# Patient Record
Sex: Female | Born: 1975
Health system: Southern US, Community
[De-identification: ages and names within clinical notes are randomized; demographics above are authoritative.]

## PROBLEM LIST (undated history)

## (undated) DIAGNOSIS — N979 Female infertility, unspecified: Secondary | ICD-10-CM

## (undated) DIAGNOSIS — M545 Low back pain, unspecified: Secondary | ICD-10-CM

## (undated) DIAGNOSIS — R002 Palpitations: Secondary | ICD-10-CM

## (undated) DIAGNOSIS — R0602 Shortness of breath: Secondary | ICD-10-CM

## (undated) DIAGNOSIS — M25559 Pain in unspecified hip: Secondary | ICD-10-CM

## (undated) DIAGNOSIS — I517 Cardiomegaly: Secondary | ICD-10-CM

## (undated) DIAGNOSIS — I509 Heart failure, unspecified: Secondary | ICD-10-CM

## (undated) DIAGNOSIS — R0600 Dyspnea, unspecified: Secondary | ICD-10-CM

## (undated) DIAGNOSIS — F329 Major depressive disorder, single episode, unspecified: Secondary | ICD-10-CM

## (undated) DIAGNOSIS — Z91018 Allergy to other foods: Secondary | ICD-10-CM

## (undated) DIAGNOSIS — I10 Essential (primary) hypertension: Secondary | ICD-10-CM

## (undated) DIAGNOSIS — F32A Depression, unspecified: Secondary | ICD-10-CM

## (undated) DIAGNOSIS — K5900 Constipation, unspecified: Secondary | ICD-10-CM

## (undated) DIAGNOSIS — K259 Gastric ulcer, unspecified as acute or chronic, without hemorrhage or perforation: Secondary | ICD-10-CM

## (undated) DIAGNOSIS — D649 Anemia, unspecified: Secondary | ICD-10-CM

## (undated) DIAGNOSIS — G4733 Obstructive sleep apnea (adult) (pediatric): Secondary | ICD-10-CM

## (undated) DIAGNOSIS — R5383 Other fatigue: Secondary | ICD-10-CM

## (undated) DIAGNOSIS — R079 Chest pain, unspecified: Secondary | ICD-10-CM

## (undated) DIAGNOSIS — E785 Hyperlipidemia, unspecified: Secondary | ICD-10-CM

## (undated) DIAGNOSIS — K219 Gastro-esophageal reflux disease without esophagitis: Secondary | ICD-10-CM

## (undated) DIAGNOSIS — R600 Localized edema: Secondary | ICD-10-CM

## (undated) DIAGNOSIS — M25519 Pain in unspecified shoulder: Secondary | ICD-10-CM

## (undated) DIAGNOSIS — R7303 Prediabetes: Secondary | ICD-10-CM

## (undated) DIAGNOSIS — F419 Anxiety disorder, unspecified: Secondary | ICD-10-CM

## (undated) DIAGNOSIS — E739 Lactose intolerance, unspecified: Secondary | ICD-10-CM

## (undated) DIAGNOSIS — Z9989 Dependence on other enabling machines and devices: Secondary | ICD-10-CM

## (undated) DIAGNOSIS — I8390 Asymptomatic varicose veins of unspecified lower extremity: Secondary | ICD-10-CM

## (undated) HISTORY — DX: Essential (primary) hypertension: I10

## (undated) HISTORY — DX: Hyperlipidemia, unspecified: E78.5

## (undated) HISTORY — DX: Localized edema: R60.0

## (undated) HISTORY — DX: Dyspnea, unspecified: R06.00

## (undated) HISTORY — DX: Asymptomatic varicose veins of unspecified lower extremity: I83.90

## (undated) HISTORY — DX: Constipation, unspecified: K59.00

## (undated) HISTORY — DX: Palpitations: R00.2

## (undated) HISTORY — DX: Other fatigue: R53.83

## (undated) HISTORY — DX: Shortness of breath: R06.02

## (undated) HISTORY — DX: Pain in unspecified shoulder: M25.519

## (undated) HISTORY — DX: Pain in unspecified hip: M25.559

## (undated) HISTORY — DX: Allergy to other foods: Z91.018

## (undated) HISTORY — DX: Anxiety disorder, unspecified: F41.9

## (undated) HISTORY — PX: ABDOMINAL HYSTERECTOMY: SHX81

## (undated) HISTORY — PX: OTHER SURGICAL HISTORY: SHX169

## (undated) HISTORY — DX: Female infertility, unspecified: N97.9

## (undated) HISTORY — PX: TOTAL ABDOMINAL HYSTERECTOMY: SHX209

## (undated) HISTORY — DX: Heart failure, unspecified: I50.9

## (undated) HISTORY — DX: Major depressive disorder, single episode, unspecified: F32.9

## (undated) HISTORY — DX: Low back pain, unspecified: M54.50

## (undated) HISTORY — DX: Dependence on other enabling machines and devices: Z99.89

## (undated) HISTORY — DX: Anemia, unspecified: D64.9

## (undated) HISTORY — DX: Lactose intolerance, unspecified: E73.9

## (undated) HISTORY — DX: Cardiomegaly: I51.7

## (undated) HISTORY — DX: Prediabetes: R73.03

## (undated) HISTORY — DX: Gastric ulcer, unspecified as acute or chronic, without hemorrhage or perforation: K25.9

## (undated) HISTORY — DX: Chest pain, unspecified: R07.9

## (undated) HISTORY — DX: Obstructive sleep apnea (adult) (pediatric): G47.33

## (undated) HISTORY — DX: Gastro-esophageal reflux disease without esophagitis: K21.9

## (undated) HISTORY — DX: Depression, unspecified: F32.A

## (undated) HISTORY — DX: Low back pain: M54.5

---

## 2005-12-17 HISTORY — PX: ENDOMETRIAL ABLATION: SHX621

## 2013-04-21 ENCOUNTER — Emergency Department (INDEPENDENT_AMBULATORY_CARE_PROVIDER_SITE_OTHER)
Admission: EM | Admit: 2013-04-21 | Discharge: 2013-04-21 | Disposition: A | Discharge status: Home Health | Payer: Self-pay | Source: Home / Self Care

## 2013-04-21 ENCOUNTER — Encounter (HOSPITAL_COMMUNITY): Payer: Self-pay | Admitting: *Deleted

## 2013-04-21 DIAGNOSIS — F329 Major depressive disorder, single episode, unspecified: Secondary | ICD-10-CM

## 2013-04-21 DIAGNOSIS — E669 Obesity, unspecified: Secondary | ICD-10-CM

## 2013-04-21 NOTE — ED Provider Notes (Signed)
History     CSN: [id number]  Arrival date & time 04/21/13  1510   First MD Initiated Contact with Patient 04/21/13 1519     New patient to establish care   HPI 37 year old obese female with history of depression for past 4 years and has been on different antidepressants on Tuesday June of last year comes in to establish care. Patient reports following at [facility] in [place] for her depression. Patient was initially on Wellbutrin and dose escalated without much effect and off and that was on several different antidepressants including Effexor. She has been off meds since last June and after she moved here has not seek medical care due to lack of insurance. She informs having depressive symptoms and being tired and sleepy all the time. informs gradual weight gain over past few years. She denies any suicidal ideation or hx of suicidal ideations in past. . Patient denies any headache, blurred vision, dizziness, chest pain, palpitations, shortness of breath, abdominal pain, nausea, vomiting, bowel or urinary symptoms. Denies any joint pains or muscle aches.  History reviewed. No pertinent past medical history.  Past Surgical History  Procedure Laterality Date  . Abdominal hysterectomy      Family History  Problem Relation Age of Onset  . Hypertension Mother   . Diabetes Father   . Thyroid disease Sister     History  Substance Use Topics  . Smoking status: Current Every Day Smoker -- 1.00 packs/day for 10 years    Types: Cigarettes  . Smokeless tobacco: Not on file  . Alcohol Use: No    OB History   Grav Para Term Preterm Abortions TAB SAB Ect Mult Living                  Review of Systems As outlined in history of present illness Allergies  Review of patient's allergies indicates no known allergies.  Home Medications  No current outpatient prescriptions on file.  BP 163/96  Pulse 82  Temp(Src) 98.3 F (36.8 C) (Oral)  Resp 18  SpO2  99%  Physical Exam Middle aged obese female in no acute distress HEENT: No pallor, moist oral mucosa Chest: Clear to auscultation bilaterally, no added sounds CVS: Normal S1 and S2, no murmurs rub or gallop Abdomen: Soft, nontender, nondistended, bowel sounds present Extremities: Warm, no edema CNS: [id number] , normal affect and mood ED Course  Procedures (including critical care time)    No diagnosis found. Depression Patient here to establish care. I have accordingly [facility] [place]  in [place] and asked for her records including progress notes, meds , labs and imaging done . They will be faxed to [name] on 5/9. She had followed with Ms Perlie Gold , FNP there. Once we get the records seem to be for a followup visit. After reviewing  records from the clinic we will decide on further tests and initiate medications.   Patient was also noted to have elevated blood pressure and denies history of hypertension. Counseled on low salt intake in diet and her blood pressure will be monitored again.   MDM  Followup in one week        Eddie North, MD 04/21/13 1558

## 2013-04-21 NOTE — ED Notes (Signed)
Present today for refill on medications

## 2017-02-21 ENCOUNTER — Ambulatory Visit (INDEPENDENT_AMBULATORY_CARE_PROVIDER_SITE_OTHER): Payer: 59 | Admitting: Nurse Practitioner

## 2017-02-21 ENCOUNTER — Ambulatory Visit (INDEPENDENT_AMBULATORY_CARE_PROVIDER_SITE_OTHER)
Admission: RE | Admit: 2017-02-21 | Discharge: 2017-02-21 | Disposition: A | Discharge status: Home / Self Care | Payer: 59 | Source: Ambulatory Visit | Attending: Nurse Practitioner | Admitting: Nurse Practitioner

## 2017-02-21 ENCOUNTER — Encounter: Payer: Self-pay | Admitting: Nurse Practitioner

## 2017-02-21 ENCOUNTER — Other Ambulatory Visit (INDEPENDENT_AMBULATORY_CARE_PROVIDER_SITE_OTHER): Payer: 59

## 2017-02-21 ENCOUNTER — Inpatient Hospital Stay (HOSPITAL_COMMUNITY)
Admission: EM | Admit: 2017-02-21 | Discharge: 2017-02-25 | DRG: 304 | Disposition: A | Discharge status: Home / Self Care | Payer: 59 | Attending: Internal Medicine | Admitting: Internal Medicine

## 2017-02-21 ENCOUNTER — Encounter (HOSPITAL_COMMUNITY): Payer: Self-pay

## 2017-02-21 VITALS — BP 170/100 | HR 98 | Temp 97.7°F | Ht 68.0 in | Wt 344.0 lb

## 2017-02-21 DIAGNOSIS — K219 Gastro-esophageal reflux disease without esophagitis: Secondary | ICD-10-CM | POA: Diagnosis present

## 2017-02-21 DIAGNOSIS — Z833 Family history of diabetes mellitus: Secondary | ICD-10-CM

## 2017-02-21 DIAGNOSIS — F419 Anxiety disorder, unspecified: Secondary | ICD-10-CM | POA: Diagnosis present

## 2017-02-21 DIAGNOSIS — R600 Localized edema: Secondary | ICD-10-CM

## 2017-02-21 DIAGNOSIS — G43909 Migraine, unspecified, not intractable, without status migrainosus: Secondary | ICD-10-CM | POA: Diagnosis present

## 2017-02-21 DIAGNOSIS — R5383 Other fatigue: Secondary | ICD-10-CM

## 2017-02-21 DIAGNOSIS — R0602 Shortness of breath: Secondary | ICD-10-CM | POA: Diagnosis present

## 2017-02-21 DIAGNOSIS — Z9114 Patient's other noncompliance with medication regimen: Secondary | ICD-10-CM | POA: Diagnosis not present

## 2017-02-21 DIAGNOSIS — R0600 Dyspnea, unspecified: Secondary | ICD-10-CM

## 2017-02-21 DIAGNOSIS — R0902 Hypoxemia: Secondary | ICD-10-CM | POA: Diagnosis present

## 2017-02-21 DIAGNOSIS — I5041 Acute combined systolic (congestive) and diastolic (congestive) heart failure: Secondary | ICD-10-CM | POA: Diagnosis present

## 2017-02-21 DIAGNOSIS — I10 Essential (primary) hypertension: Secondary | ICD-10-CM | POA: Insufficient documentation

## 2017-02-21 DIAGNOSIS — F17210 Nicotine dependence, cigarettes, uncomplicated: Secondary | ICD-10-CM | POA: Diagnosis present

## 2017-02-21 DIAGNOSIS — Z8349 Family history of other endocrine, nutritional and metabolic diseases: Secondary | ICD-10-CM | POA: Diagnosis not present

## 2017-02-21 DIAGNOSIS — I5021 Acute systolic (congestive) heart failure: Secondary | ICD-10-CM | POA: Diagnosis not present

## 2017-02-21 DIAGNOSIS — I161 Hypertensive emergency: Secondary | ICD-10-CM | POA: Diagnosis not present

## 2017-02-21 DIAGNOSIS — R51 Headache: Secondary | ICD-10-CM | POA: Diagnosis not present

## 2017-02-21 DIAGNOSIS — Z8249 Family history of ischemic heart disease and other diseases of the circulatory system: Secondary | ICD-10-CM

## 2017-02-21 DIAGNOSIS — I43 Cardiomyopathy in diseases classified elsewhere: Secondary | ICD-10-CM | POA: Diagnosis present

## 2017-02-21 DIAGNOSIS — Z720 Tobacco use: Secondary | ICD-10-CM | POA: Diagnosis not present

## 2017-02-21 DIAGNOSIS — R05 Cough: Secondary | ICD-10-CM | POA: Diagnosis not present

## 2017-02-21 DIAGNOSIS — I248 Other forms of acute ischemic heart disease: Secondary | ICD-10-CM | POA: Diagnosis present

## 2017-02-21 DIAGNOSIS — I160 Hypertensive urgency: Secondary | ICD-10-CM | POA: Diagnosis present

## 2017-02-21 DIAGNOSIS — F32A Depression, unspecified: Secondary | ICD-10-CM | POA: Diagnosis present

## 2017-02-21 DIAGNOSIS — F4323 Adjustment disorder with mixed anxiety and depressed mood: Secondary | ICD-10-CM | POA: Insufficient documentation

## 2017-02-21 DIAGNOSIS — Z6841 Body Mass Index (BMI) 40.0 and over, adult: Secondary | ICD-10-CM | POA: Diagnosis not present

## 2017-02-21 DIAGNOSIS — J811 Chronic pulmonary edema: Secondary | ICD-10-CM

## 2017-02-21 DIAGNOSIS — I429 Cardiomyopathy, unspecified: Secondary | ICD-10-CM

## 2017-02-21 DIAGNOSIS — R519 Headache, unspecified: Secondary | ICD-10-CM

## 2017-02-21 DIAGNOSIS — F339 Major depressive disorder, recurrent, unspecified: Secondary | ICD-10-CM | POA: Insufficient documentation

## 2017-02-21 DIAGNOSIS — R4182 Altered mental status, unspecified: Secondary | ICD-10-CM | POA: Diagnosis not present

## 2017-02-21 DIAGNOSIS — I428 Other cardiomyopathies: Secondary | ICD-10-CM | POA: Diagnosis not present

## 2017-02-21 DIAGNOSIS — F329 Major depressive disorder, single episode, unspecified: Secondary | ICD-10-CM | POA: Diagnosis present

## 2017-02-21 DIAGNOSIS — I509 Heart failure, unspecified: Secondary | ICD-10-CM | POA: Diagnosis not present

## 2017-02-21 DIAGNOSIS — Z0000 Encounter for general adult medical examination without abnormal findings: Secondary | ICD-10-CM

## 2017-02-21 DIAGNOSIS — F418 Other specified anxiety disorders: Secondary | ICD-10-CM | POA: Diagnosis not present

## 2017-02-21 DIAGNOSIS — R079 Chest pain, unspecified: Secondary | ICD-10-CM

## 2017-02-21 DIAGNOSIS — I110 Hypertensive heart disease with heart failure: Secondary | ICD-10-CM | POA: Diagnosis present

## 2017-02-21 DIAGNOSIS — J810 Acute pulmonary edema: Secondary | ICD-10-CM | POA: Diagnosis not present

## 2017-02-21 HISTORY — DX: Acute combined systolic (congestive) and diastolic (congestive) heart failure: I50.41

## 2017-02-21 LAB — COMPREHENSIVE METABOLIC PANEL
ALT: 12 U/L (ref 0–35)
AST: 13 U/L (ref 0–37)
Albumin: 3.6 g/dL (ref 3.5–5.2)
Alkaline Phosphatase: 70 U/L (ref 39–117)
BUN: 18 mg/dL (ref 6–23)
CHLORIDE: 103 meq/L (ref 96–112)
CO2: 30 meq/L (ref 19–32)
Calcium: 9.2 mg/dL (ref 8.4–10.5)
Creatinine, Ser: 1.1 mg/dL (ref 0.40–1.20)
GFR: 58.98 mL/min — AB (ref >60.00)
GLUCOSE: 99 mg/dL (ref 70–99)
POTASSIUM: 3.8 meq/L (ref 3.5–5.1)
Sodium: 138 mEq/L (ref 135–145)
Total Bilirubin: 0.6 mg/dL (ref 0.2–1.2)
Total Protein: 6.5 g/dL (ref 6.0–8.3)

## 2017-02-21 LAB — CBC WITH DIFFERENTIAL/PLATELET
BASOS ABS: 0.1 10*3/uL (ref 0.0–0.1)
Basophils Relative: 0.9 % (ref 0.0–3.0)
EOS PCT: 1.4 % (ref 0.0–5.0)
Eosinophils Absolute: 0.1 10*3/uL (ref 0.0–0.7)
HCT: 39.6 % (ref 36.0–46.0)
HEMOGLOBIN: 11.9 g/dL — AB (ref 12.0–15.0)
LYMPHS PCT: 17 % (ref 12.0–46.0)
Lymphs Abs: 1.3 10*3/uL (ref 0.7–4.0)
MCHC: 30.1 g/dL (ref 30.0–36.0)
MCV: 79.8 fl (ref 78.0–100.0)
MONOS PCT: 7.9 % (ref 3.0–12.0)
Monocytes Absolute: 0.6 10*3/uL (ref 0.1–1.0)
Neutro Abs: 5.7 10*3/uL (ref 1.4–7.7)
Neutrophils Relative %: 72.8 % (ref 43.0–77.0)
Platelets: 214 10*3/uL (ref 150.0–400.0)
RBC: 4.97 Mil/uL (ref 3.87–5.11)
RDW: 17.2 % — ABNORMAL HIGH (ref 11.5–15.5)
WBC: 7.9 10*3/uL (ref 4.0–10.5)

## 2017-02-21 LAB — TROPONIN I: TROPONIN I: 0.04 ng/mL — AB (ref <0.03)

## 2017-02-21 LAB — RAPID URINE DRUG SCREEN, HOSP PERFORMED
AMPHETAMINES: NOT DETECTED
BENZODIAZEPINES: NOT DETECTED
Barbiturates: NOT DETECTED
Cocaine: NOT DETECTED
OPIATES: NOT DETECTED
Tetrahydrocannabinol: NOT DETECTED

## 2017-02-21 LAB — I-STAT TROPONIN, ED: Troponin i, poc: 0.03 ng/mL (ref 0.00–0.08)

## 2017-02-21 LAB — TSH: TSH: 4.78 u[IU]/mL — ABNORMAL HIGH (ref 0.35–4.50)

## 2017-02-21 LAB — BRAIN NATRIURETIC PEPTIDE: Pro B Natriuretic peptide (BNP): 1145 pg/mL — ABNORMAL HIGH (ref 0.0–100.0)

## 2017-02-21 LAB — HEMOGLOBIN A1C: HEMOGLOBIN A1C: 5.8 % (ref 4.6–6.5)

## 2017-02-21 MED ORDER — ALPRAZOLAM 0.25 MG PO TABS
0.2500 mg | ORAL_TABLET | Freq: Two times a day (BID) | ORAL | Status: DC | PRN
  Administered 2017-02-23 – 2017-02-25 (×2): 0.25 mg via ORAL
  Filled 2017-02-21 (×2): qty 1

## 2017-02-21 MED ORDER — AMLODIPINE BESYLATE 5 MG PO TABS
10.0000 mg | ORAL_TABLET | Freq: Every day | ORAL | Status: DC
  Administered 2017-02-21: 10 mg via ORAL
  Filled 2017-02-21: qty 2

## 2017-02-21 MED ORDER — ONDANSETRON HCL 4 MG/2ML IJ SOLN
4.0000 mg | Freq: Four times a day (QID) | INTRAMUSCULAR | Status: DC | PRN
  Administered 2017-02-21 – 2017-02-22 (×2): 4 mg via INTRAVENOUS
  Filled 2017-02-21 (×3): qty 2

## 2017-02-21 MED ORDER — FUROSEMIDE 10 MG/ML IJ SOLN
60.0000 mg | Freq: Once | INTRAMUSCULAR | Status: AC
Start: 2017-02-21 — End: 2017-02-21
  Administered 2017-02-21: 60 mg via INTRAVENOUS
  Filled 2017-02-21: qty 8

## 2017-02-21 MED ORDER — POTASSIUM CHLORIDE CRYS ER 20 MEQ PO TBCR: 20.0000 meq | EXTENDED_RELEASE_TABLET | Freq: Every day | ORAL | 3 refills | Status: DC

## 2017-02-21 MED ORDER — LISINOPRIL 10 MG PO TABS
10.0000 mg | ORAL_TABLET | Freq: Every day | ORAL | Status: DC
  Administered 2017-02-21: 10 mg via ORAL
  Filled 2017-02-21 (×2): qty 1

## 2017-02-21 MED ORDER — FUROSEMIDE 10 MG/ML IJ SOLN
60.0000 mg | Freq: Two times a day (BID) | INTRAMUSCULAR | Status: DC
  Administered 2017-02-22 – 2017-02-23 (×4): 60 mg via INTRAVENOUS
  Filled 2017-02-21: qty 6
  Filled 2017-02-21: qty 8
  Filled 2017-02-21 (×2): qty 6

## 2017-02-21 MED ORDER — LISINOPRIL 10 MG PO TABS: 10.0000 mg | ORAL_TABLET | Freq: Every day | ORAL | 3 refills | Status: DC

## 2017-02-21 MED ORDER — METOPROLOL SUCCINATE ER 25 MG PO TB24: 25.0000 mg | ORAL_TABLET | Freq: Every day | ORAL | 3 refills | Status: DC

## 2017-02-21 MED ORDER — SODIUM CHLORIDE 0.9 % IV SOLN: 250.0000 mL | INTRAVENOUS | Status: DC | PRN

## 2017-02-21 MED ORDER — NICOTINE 21 MG/24HR TD PT24
21.0000 mg | MEDICATED_PATCH | Freq: Every day | TRANSDERMAL | Status: DC
  Administered 2017-02-22 – 2017-02-25 (×4): 21 mg via TRANSDERMAL
  Filled 2017-02-21 (×4): qty 1

## 2017-02-21 MED ORDER — SODIUM CHLORIDE 0.9% FLUSH
3.0000 mL | Freq: Two times a day (BID) | INTRAVENOUS | Status: DC
  Administered 2017-02-22 – 2017-02-24 (×5): 3 mL via INTRAVENOUS

## 2017-02-21 MED ORDER — ACETAMINOPHEN 325 MG PO TABS
650.0000 mg | ORAL_TABLET | ORAL | Status: DC | PRN
Start: 2017-02-21 — End: 2017-02-25
  Administered 2017-02-22 – 2017-02-23 (×6): 650 mg via ORAL
  Filled 2017-02-21 (×6): qty 2

## 2017-02-21 MED ORDER — HYDROCHLOROTHIAZIDE 25 MG PO TABS: 25.0000 mg | ORAL_TABLET | Freq: Every day | ORAL | 3 refills | Status: DC

## 2017-02-21 MED ORDER — SODIUM CHLORIDE 0.9% FLUSH: 3.0000 mL | INTRAVENOUS | Status: DC | PRN

## 2017-02-21 MED ORDER — POTASSIUM CHLORIDE 20 MEQ/15ML (10%) PO SOLN
40.0000 meq | Freq: Once | ORAL | Status: AC
Start: 2017-02-21 — End: 2017-02-21
  Administered 2017-02-21: 40 meq via ORAL
  Filled 2017-02-21: qty 30

## 2017-02-21 MED ORDER — NITROGLYCERIN 0.4 MG SL SUBL
0.4000 mg | SUBLINGUAL_TABLET | SUBLINGUAL | Status: DC | PRN
  Administered 2017-02-21: 0.4 mg via SUBLINGUAL
  Filled 2017-02-21: qty 1

## 2017-02-21 MED ORDER — ZOLPIDEM TARTRATE 5 MG PO TABS: 5.0000 mg | ORAL_TABLET | Freq: Every evening | ORAL | Status: DC | PRN

## 2017-02-21 MED ORDER — MORPHINE SULFATE (PF) 4 MG/ML IV SOLN
2.0000 mg | INTRAVENOUS | Status: DC | PRN
  Administered 2017-02-21 – 2017-02-22 (×2): 2 mg via INTRAVENOUS
  Filled 2017-02-21: qty 1

## 2017-02-21 MED ORDER — ENOXAPARIN SODIUM 40 MG/0.4ML ~~LOC~~ SOLN
40.0000 mg | SUBCUTANEOUS | Status: DC
  Administered 2017-02-21 – 2017-02-24 (×4): 40 mg via SUBCUTANEOUS
  Filled 2017-02-21 (×4): qty 0.4

## 2017-02-21 MED ORDER — NITROGLYCERIN IN D5W 200-5 MCG/ML-% IV SOLN
2.0000 ug/min | INTRAVENOUS | Status: DC
  Administered 2017-02-21: 5 ug/min via INTRAVENOUS
  Filled 2017-02-21: qty 250

## 2017-02-21 MED ORDER — ACETAMINOPHEN 325 MG PO TABS
650.0000 mg | ORAL_TABLET | ORAL | Status: AC
  Administered 2017-02-21: 650 mg via ORAL
  Filled 2017-02-21: qty 2

## 2017-02-21 MED ORDER — MORPHINE SULFATE (PF) 4 MG/ML IV SOLN
INTRAVENOUS | Status: AC
  Administered 2017-02-21: 2 mg via INTRAVENOUS
  Filled 2017-02-21: qty 1

## 2017-02-21 MED ORDER — METOPROLOL SUCCINATE ER 25 MG PO TB24
25.0000 mg | ORAL_TABLET | Freq: Every day | ORAL | Status: DC
  Administered 2017-02-21: 25 mg via ORAL
  Filled 2017-02-21 (×2): qty 1

## 2017-02-21 MED ORDER — ASPIRIN 325 MG PO TABS
325.0000 mg | ORAL_TABLET | Freq: Every day | ORAL | Status: DC
  Administered 2017-02-21 – 2017-02-25 (×5): 325 mg via ORAL
  Filled 2017-02-21 (×5): qty 1

## 2017-02-21 NOTE — ED Notes (Signed)
Hospitalist Clyde Lundborg) Provider at bedside.

## 2017-02-21 NOTE — Patient Instructions (Addendum)
Referred patient to hospital due to pulmonary edema and pericardial effusion per CXR, Pending lab results.   DASH Eating Plan DASH stands for "Dietary Approaches to Stop Hypertension." The DASH eating plan is a healthy eating plan that has been shown to reduce high blood pressure (hypertension). It may also reduce your risk for type 2 diabetes, heart disease, and stroke. The DASH eating plan may also help with weight loss. What are tips for following this plan? General guidelines   Avoid eating more than 2,300 mg (milligrams) of salt (sodium) a day. If you have hypertension, you may need to reduce your sodium intake to 1,500 mg a day.  Limit alcohol intake to no more than 1 drink a day for nonpregnant women and 2 drinks a day for men. One drink equals 12 oz of beer, 5 oz of wine, or 1 oz of hard liquor.  Work with your health care provider to maintain a healthy body weight or to lose weight. Ask what an ideal weight is for you.  Get at least 30 minutes of exercise that causes your heart to beat faster (aerobic exercise) most days of the week. Activities may include walking, swimming, or biking.  Work with your health care provider or diet and nutrition specialist (dietitian) to adjust your eating plan to your individual calorie needs. Reading food labels   Check food labels for the amount of sodium per serving. Choose foods with less than 5 percent of the Daily Value of sodium. Generally, foods with less than 300 mg of sodium per serving fit into this eating plan.  To find whole grains, look for the word "whole" as the first word in the ingredient list. Shopping   Buy products labeled as "low-sodium" or "no salt added."  Buy fresh foods. Avoid canned foods and premade or frozen meals. Cooking   Avoid adding salt when cooking. Use salt-free seasonings or herbs instead of table salt or sea salt. Check with your health care provider or pharmacist before using salt substitutes.  Do not  fry foods. Cook foods using healthy methods such as baking, boiling, grilling, and broiling instead.  Cook with heart-healthy oils, such as olive, canola, soybean, or sunflower oil. Meal planning    Eat a balanced diet that includes:  5 or more servings of fruits and vegetables each day. At each meal, try to fill half of your plate with fruits and vegetables.  Up to 6-8 servings of whole grains each day.  Less than 6 oz of lean meat, poultry, or fish each day. A 3-oz serving of meat is about the same size as a deck of cards. One egg equals 1 oz.  2 servings of low-fat dairy each day.  A serving of nuts, seeds, or beans 5 times each week.  Heart-healthy fats. Healthy fats called Omega-3 fatty acids are found in foods such as flaxseeds and coldwater fish, like sardines, salmon, and mackerel.  Limit how much you eat of the following:  Canned or prepackaged foods.  Food that is high in trans fat, such as fried foods.  Food that is high in saturated fat, such as fatty meat.  Sweets, desserts, sugary drinks, and other foods with added sugar.  Full-fat dairy products.  Do not salt foods before eating.  Try to eat at least 2 vegetarian meals each week.  Eat more home-cooked food and less restaurant, buffet, and fast food.  When eating at a restaurant, ask that your food be prepared with less salt or  no salt, if possible. What foods are recommended? The items listed may not be a complete list. Talk with your dietitian about what dietary choices are best for you. Grains  Whole-grain or whole-wheat bread. Whole-grain or whole-wheat pasta. Brown rice. Orpah Cobb. Bulgur. Whole-grain and low-sodium cereals. Pita bread. Low-fat, low-sodium crackers. Whole-wheat flour tortillas. Vegetables  Fresh or frozen vegetables (raw, steamed, roasted, or grilled). Low-sodium or reduced-sodium tomato and vegetable juice. Low-sodium or reduced-sodium tomato sauce and tomato paste. Low-sodium or  reduced-sodium canned vegetables. Fruits  All fresh, dried, or frozen fruit. Canned fruit in natural juice (without added sugar). Meat and other protein foods  Skinless chicken or [place]. Ground chicken or [place]. Pork with fat trimmed off. Fish and seafood. Egg whites. Dried beans, peas, or lentils. Unsalted nuts, nut butters, and seeds. Unsalted canned beans. Lean cuts of beef with fat trimmed off. Low-sodium, lean deli meat. Dairy  Low-fat (1%) or fat-free (skim) milk. Fat-free, low-fat, or reduced-fat cheeses. Nonfat, low-sodium ricotta or cottage cheese. Low-fat or nonfat yogurt. Low-fat, low-sodium cheese. Fats and oils  Soft margarine without trans fats. Vegetable oil. Low-fat, reduced-fat, or light mayonnaise and salad dressings (reduced-sodium). Canola, safflower, olive, soybean, and sunflower oils. Avocado. Seasoning and other foods  Herbs. Spices. Seasoning mixes without salt. Unsalted popcorn and pretzels. Fat-free sweets. What foods are not recommended? The items listed may not be a complete list. Talk with your dietitian about what dietary choices are best for you. Grains  Baked goods made with fat, such as croissants, muffins, or some breads. Dry pasta or rice meal packs. Vegetables  Creamed or fried vegetables. Vegetables in a cheese sauce. Regular canned vegetables (not low-sodium or reduced-sodium). Regular canned tomato sauce and paste (not low-sodium or reduced-sodium). Regular tomato and vegetable juice (not low-sodium or reduced-sodium). Rosita Fire. Olives. Fruits  Canned fruit in a light or heavy syrup. Fried fruit. Fruit in cream or butter sauce. Meat and other protein foods  Fatty cuts of meat. Ribs. Fried meat. Tomasa Blase. Sausage. Bologna and other processed lunch meats. Salami. Fatback. Hotdogs. Bratwurst. Salted nuts and seeds. Canned beans with added salt. Canned or smoked fish. Whole eggs or egg yolks. Chicken or [place] with skin. Dairy  Whole or 2% milk, cream, and  half-and-half. Whole or full-fat cream cheese. Whole-fat or sweetened yogurt. Full-fat cheese. Nondairy creamers. Whipped toppings. Processed cheese and cheese spreads. Fats and oils  Butter. Stick margarine. Lard. Shortening. Ghee. Bacon fat. Tropical oils, such as coconut, palm kernel, or palm oil. Seasoning and other foods  Salted popcorn and pretzels. Onion salt, garlic salt, seasoned salt, table salt, and sea salt. Worcestershire sauce. Tartar sauce. Barbecue sauce. Teriyaki sauce. Soy sauce, including reduced-sodium. Steak sauce. Canned and packaged gravies. Fish sauce. Oyster sauce. Cocktail sauce. Horseradish that you find on the shelf. Ketchup. Mustard. Meat flavorings and tenderizers. Bouillon cubes. Hot sauce and Tabasco sauce. Premade or packaged marinades. Premade or packaged taco seasonings. Relishes. Regular salad dressings. Where to find more information:  National Heart, Lung, and Blood Institute: PopSteam.is  American Heart Association: www.heart.org Summary  The DASH eating plan is a healthy eating plan that has been shown to reduce high blood pressure (hypertension). It may also reduce your risk for type 2 diabetes, heart disease, and stroke.  With the DASH eating plan, you should limit salt (sodium) intake to 2,300 mg a day. If you have hypertension, you may need to reduce your sodium intake to 1,500 mg a day.  When on the DASH eating plan, aim  to eat more fresh fruits and vegetables, whole grains, lean proteins, low-fat dairy, and heart-healthy fats.  Work with your health care provider or diet and nutrition specialist (dietitian) to adjust your eating plan to your individual calorie needs. This information is not intended to replace advice given to you by your health care provider. Make sure you discuss any questions you have with your health care provider. Document Released: 11/22/2011 Document Revised: 11/26/2016 Document Reviewed: 11/26/2016 Elsevier Interactive  Patient Education  2017 [employer].  Hypertension Hypertension, commonly called high blood pressure, is when the force of blood pumping through the arteries is too strong. The arteries are the blood vessels that carry blood from the heart throughout the body. Hypertension forces the heart to work harder to pump blood and may cause arteries to become narrow or stiff. Having untreated or uncontrolled hypertension can cause heart attacks, strokes, kidney disease, and other problems. A blood pressure reading consists of a higher number over a lower number. Ideally, your blood pressure should be below 120/80. The first ("top") number is called the systolic pressure. It is a measure of the pressure in your arteries as your heart beats. The second ("bottom") number is called the diastolic pressure. It is a measure of the pressure in your arteries as the heart relaxes. What are the causes? The cause of this condition is not known. What increases the risk? Some risk factors for high blood pressure are under your control. Others are not. Factors you can change   Smoking.  Having type 2 diabetes mellitus, high cholesterol, or both.  Not getting enough exercise or physical activity.  Being overweight.  Having too much fat, sugar, calories, or salt (sodium) in your diet.  Drinking too much alcohol. Factors that are difficult or impossible to change   Having chronic kidney disease.  Having a family history of high blood pressure.  Age. Risk increases with age.  Race. You may be at higher risk if you are African-American.  Gender. Men are at higher risk than women before age 4. After age 69, women are at higher risk than men.  Having obstructive sleep apnea.  Stress. What are the signs or symptoms? Extremely high blood pressure (hypertensive crisis) may cause:  Headache.  Anxiety.  Shortness of breath.  Nosebleed.  Nausea and vomiting.  Severe chest pain.  Jerky movements you  cannot control (seizures). How is this diagnosed? This condition is diagnosed by measuring your blood pressure while you are seated, with your arm resting on a surface. The cuff of the blood pressure monitor will be placed directly against the skin of your upper arm at the level of your heart. It should be measured at least twice using the same arm. Certain conditions can cause a difference in blood pressure between your right and left arms. Certain factors can cause blood pressure readings to be lower or higher than normal (elevated) for a short period of time:  When your blood pressure is higher when you are in a health care provider's office than when you are at home, this is called white coat hypertension. Most people with this condition do not need medicines.  When your blood pressure is higher at home than when you are in a health care provider's office, this is called masked hypertension. Most people with this condition may need medicines to control blood pressure. If you have a high blood pressure reading during one visit or you have normal blood pressure with other risk factors:  You  may be asked to return on a different day to have your blood pressure checked again.  You may be asked to monitor your blood pressure at home for 1 week or longer. If you are diagnosed with hypertension, you may have other blood or imaging tests to help your health care provider understand your overall risk for other conditions. How is this treated? This condition is treated by making healthy lifestyle changes, such as eating healthy foods, exercising more, and reducing your alcohol intake. Your health care provider may prescribe medicine if lifestyle changes are not enough to get your blood pressure under control, and if:  Your systolic blood pressure is above 130.  Your diastolic blood pressure is above 80. Your personal target blood pressure may vary depending on your medical conditions, your age, and  other factors. Follow these instructions at home: Eating and drinking   Eat a diet that is high in fiber and potassium, and low in sodium, added sugar, and fat. An example eating plan is called the DASH (Dietary Approaches to Stop Hypertension) diet. To eat this way:  Eat plenty of fresh fruits and vegetables. Try to fill half of your plate at each meal with fruits and vegetables.  Eat whole grains, such as whole wheat pasta, brown rice, or whole grain bread. Fill about one quarter of your plate with whole grains.  Eat or drink low-fat dairy products, such as skim milk or low-fat yogurt.  Avoid fatty cuts of meat, processed or cured meats, and poultry with skin. Fill about one quarter of your plate with lean proteins, such as fish, chicken without skin, beans, eggs, and tofu.  Avoid premade and processed foods. These tend to be higher in sodium, added sugar, and fat.  Reduce your daily sodium intake. Most people with hypertension should eat less than 1,500 mg of sodium a day.  Limit alcohol intake to no more than 1 drink a day for nonpregnant women and 2 drinks a day for men. One drink equals 12 oz of beer, 5 oz of wine, or 1 oz of hard liquor. Lifestyle   Work with your health care provider to maintain a healthy body weight or to lose weight. Ask what an ideal weight is for you.  Get at least 30 minutes of exercise that causes your heart to beat faster (aerobic exercise) most days of the week. Activities may include walking, swimming, or biking.  Include exercise to strengthen your muscles (resistance exercise), such as pilates or lifting weights, as part of your weekly exercise routine. Try to do these types of exercises for 30 minutes at least 3 days a week.  Do not use any products that contain nicotine or tobacco, such as cigarettes and e-cigarettes. If you need help quitting, ask your health care provider.  Monitor your blood pressure at home as told by your health care  provider.  Keep all follow-up visits as told by your health care provider. This is important. Medicines   Take over-the-counter and prescription medicines only as told by your health care provider. Follow directions carefully. Blood pressure medicines must be taken as prescribed.  Do not skip doses of blood pressure medicine. Doing this puts you at risk for problems and can make the medicine less effective.  Ask your health care provider about side effects or reactions to medicines that you should watch for. Contact a health care provider if:  You think you are having a reaction to a medicine you are taking.  You have  headaches that keep coming back (recurring).  You feel dizzy.  You have swelling in your ankles.  You have trouble with your vision. Get help right away if:  You develop a severe headache or confusion.  You have unusual weakness or numbness.  You feel faint.  You have severe pain in your chest or abdomen.  You vomit repeatedly.  You have trouble breathing. Summary  Hypertension is when the force of blood pumping through your arteries is too strong. If this condition is not controlled, it may put you at risk for serious complications.  Your personal target blood pressure may vary depending on your medical conditions, your age, and other factors. For most people, a normal blood pressure is less than 120/80.  Hypertension is treated with lifestyle changes, medicines, or a combination of both. Lifestyle changes include weight loss, eating a healthy, low-sodium diet, exercising more, and limiting alcohol. This information is not intended to replace advice given to you by your health care provider. Make sure you discuss any questions you have with your health care provider. Document Released: 12/03/2005 Document Revised: 10/31/2016 Document Reviewed: 10/31/2016 Elsevier Interactive Patient Education  2017 [employer].

## 2017-02-21 NOTE — ED Provider Notes (Signed)
[facility]-EMERGENCY DEPT Provider Note   CSN: [id number] Arrival date & time: 02/21/17  1714     History   Chief Complaint Chief Complaint  Patient presents with  . Leg Swelling  . Shortness of Breath    HPI Amber Bentley is a 41 y.o. female.  HPI  Went to initial visit with PCP today and reported concerns of leg swelling, shortness of breath.  "Feel like insides hurt" Reports everything hurts. Chest pain with exertion since November, mostly is sharp pain, like a "stich in my side" but located in chest.  Shortness of breath present for about one year.  Worse with laying down flat, progressing over the last month or two.  Felt like had flu last year and felt like it never went away. Leg swelling with pitting to abdomen.  Hx of smoking Father had MI in his 19s htn    Past Medical History:  Diagnosis Date  . Anxiety   . Depression     Patient Active Problem List   Diagnosis Date Noted  . HTN (hypertension), benign 02/21/2017  . Adjustment disorder with mixed anxiety and depressed mood 02/21/2017  . Acute CHF (congestive heart failure) (HCC) 02/21/2017  . Hypertensive emergency 02/21/2017  . Tobacco abuse 02/21/2017  . Chest pain 02/21/2017  . Anxiety and depression 02/21/2017    Past Surgical History:  Procedure Laterality Date  . ABDOMINAL HYSTERECTOMY      OB History    No data available       Home Medications    Prior to Admission medications   Medication Sig Start Date End Date Taking? Authorizing Provider  ibuprofen (ADVIL,MOTRIN) 200 MG tablet Take 1,000 mg by mouth every 6 (six) hours as needed for headache, mild pain or moderate pain.   Yes Historical Provider, MD  lisinopril (PRINIVIL,ZESTRIL) 10 MG tablet Take 1 tablet (10 mg total) by mouth daily. 02/21/17   Anne Ng, NP  metoprolol succinate (TOPROL-XL) 25 MG 24 hr tablet Take 1 tablet (25 mg total) by mouth daily. 02/21/17   Anne Ng, NP  potassium chloride SA (K-DUR,KLOR-CON) 20 MEQ  tablet Take 1 tablet (20 mEq total) by mouth daily. 02/21/17   Anne Ng, NP    Family History Family History  Problem Relation Age of Onset  . Hypertension Mother   . Cancer Mother     breast cancer  . Diabetes Father   . Heart disease Father   . Hyperlipidemia Father   . Thyroid disease Sister   . Sudden death Brother   . Cancer Maternal Aunt     breast cancer  . Cancer Paternal Grandfather     brain cancer    Social History Social History  Substance Use Topics  . Smoking status: Current Every Day Smoker    Packs/day: 0.25    Years: 10.00    Types: Cigarettes  . Smokeless tobacco: Never Used  . Alcohol use Yes     Comment: rarely     Allergies   Food and Aleve [naproxen]   Review of Systems Review of Systems  Constitutional: Negative for fever.  HENT: Negative for sore throat.   Eyes: Negative for visual disturbance.  Respiratory: Positive for cough (also for 1 yr) and shortness of breath.   Cardiovascular: Positive for chest pain and leg swelling.  Gastrointestinal: Positive for nausea. Negative for abdominal pain.  Genitourinary: Negative for difficulty urinating.  Musculoskeletal: Negative for back pain and neck pain.  Skin: Negative for rash.  Neurological: Positive for light-headedness. Negative for syncope and headaches.     Physical Exam Updated Vital Signs BP (!) 192/119 (BP Location: Left Wrist)   Pulse 86   Temp 97.9 F (36.6 C) (Oral)   Resp 18   SpO2 95%   Physical Exam  Constitutional: She is oriented to person, place, and time. She appears well-developed and well-nourished. No distress.  HENT:  Head: Normocephalic and atraumatic.  Eyes: Conjunctivae and EOM are normal.  Neck: Normal range of motion.  Cardiovascular: Normal rate, regular rhythm, normal heart sounds and intact distal pulses.  Exam reveals no gallop and no friction rub.   No murmur heard. Pulmonary/Chest: Effort normal and breath sounds normal. No respiratory  distress. She has no wheezes. She has no rales.  Abdominal: Soft. She exhibits no distension. There is no tenderness. There is no guarding.  Musculoskeletal: She exhibits edema (3+ bilaterally, pitting to abdomen). She exhibits no tenderness.  Neurological: She is alert and oriented to person, place, and time.  Skin: Skin is warm and dry. No rash noted. She is not diaphoretic. No erythema.  Nursing note and vitals reviewed.    ED Treatments / Results  Labs (all labs ordered are listed, but only abnormal results are displayed) Labs Reviewed  TROPONIN I - Abnormal; Notable for the following:       Result Value   Troponin I 0.04 (*)    All other components within normal limits  T4, FREE  RAPID URINE DRUG SCREEN, HOSP PERFORMED  T3, FREE  LIPID PANEL  TROPONIN I  TROPONIN I  HIV ANTIBODY (ROUTINE TESTING)  BASIC METABOLIC PANEL  I-STAT TROPOININ, ED    EKG  EKG Interpretation  Date/Time:  Thursday February 21 2017 18:59:19 EST Ventricular Rate:  92 PR Interval:    QRS Duration: 94 QT Interval:  376 QTC Calculation: 466 R Axis:   84 Text Interpretation:  Sinus rhythm Left atrial enlargement Borderline repolarization abnormality No previous ECGs available Confirmed by [facility] MD, Adrianna Dudas ([id number]) on 02/21/2017 8:06:13 PM       Radiology Dg Chest 2 View  Result Date: 02/21/2017 CLINICAL DATA:  41 y/o female with cough fatigue congestion and fluid retention for 1 month. Initial encounter. Smoker. EXAM: CHEST  2 VIEW COMPARISON:  None. FINDINGS: Large body habitus. Moderate to severe cardiomegaly. No pneumothorax, pleural effusion or consolidation, but there is peripheral linear increased mostly interstitial appearing opacity in both lungs, more apparent on the left. No acute osseous abnormality identified. Negative visible bowel gas pattern. IMPRESSION: 1. Mild pulmonary interstitial edema suspected. No pleural effusion. 2. Moderate to severe cardiomegaly.  Consider pericardial  effusion. Electronically Signed   By: Odessa Fleming M.D.   On: 02/21/2017 15:35    Procedures [name] bedside Date/Time: 02/22/2017 2:41 AM Performed by: Alvira Monday Authorized by: Alvira Monday  Consent: Verbal consent obtained. Consent given by: patient Patient identity confirmed: verbally with patient Time out: Immediately prior to procedure a "time out" was called to verify the correct patient, procedure, equipment, support staff and site/side marked as required. Preparation: Patient was prepped and draped in the usual sterile fashion. Local anesthesia used: no  Anesthesia: Local anesthesia used: no  Sedation: Patient sedated: no Patient tolerance: Patient tolerated the procedure well with no immediate complications Comments: Limited cardiac echo performed Parasternal long, short and subxiphoid views obtained Unable to get apical 4 chamber  Trace-small pericardial effusion No tamponade Estimate decreased EF    (including critical care time)  Medications Ordered in  ED Medications  nitroGLYCERIN (NITROSTAT) SL tablet 0.4 mg (0.4 mg Sublingual Given 02/21/17 1941)  nitroGLYCERIN 50 mg in dextrose 5 % 250 mL (0.2 mg/mL) infusion (120 mcg/min Intravenous Rate/Dose Change 02/22/17 0017)  lisinopril (PRINIVIL,ZESTRIL) tablet 10 mg (10 mg Oral Given 02/21/17 2151)  metoprolol succinate (TOPROL-XL) 24 hr tablet 25 mg (25 mg Oral Given 02/21/17 2151)  nicotine (NICODERM CQ - dosed in mg/24 hours) patch 21 mg (21 mg Transdermal Refused 02/21/17 2137)  furosemide (LASIX) injection 60 mg (not administered)  aspirin tablet 325 mg (325 mg Oral Given 02/21/17 2142)  morphine 4 MG/ML injection 2 mg (2 mg Intravenous Given 02/21/17 2135)  sodium chloride flush (NS) 0.9 % injection 3 mL (3 mLs Intravenous Not Given 02/21/17 2153)  sodium chloride flush (NS) 0.9 % injection 3 mL (not administered)  0.9 %  sodium chloride infusion (not administered)  acetaminophen (TYLENOL) tablet 650 mg (650 mg Oral Given  02/22/17 0208)  ondansetron (ZOFRAN) injection 4 mg (4 mg Intravenous Given 02/21/17 2142)  enoxaparin (LOVENOX) injection 40 mg (40 mg Subcutaneous Given 02/21/17 2152)  ALPRAZolam (XANAX) tablet 0.25 mg (not administered)  zolpidem (AMBIEN) tablet 5 mg (not administered)  amLODipine (NORVASC) tablet 10 mg (10 mg Oral Given 02/21/17 2142)  furosemide (LASIX) injection 60 mg (60 mg Intravenous Given 02/21/17 1941)  acetaminophen (TYLENOL) tablet 650 mg (650 mg Oral Given 02/21/17 2046)  potassium chloride 20 MEQ/15ML (10%) solution 40 mEq (40 mEq Oral Given 02/21/17 2211)   CRITICAL CARE:htn emergency Performed by: Rhae Lerner   Total critical care time: 30 minutes  Critical care time was exclusive of separately billable procedures and treating other patients.  Critical care was necessary to treat or prevent imminent or life-threatening deterioration.  Critical care was time spent personally by me on the following activities: development of treatment plan with patient and/or surrogate as well as nursing, discussions with consultants, evaluation of patient's response to treatment, examination of patient, obtaining history from patient or surrogate, ordering and performing treatments and interventions, ordering and review of laboratory studies, ordering and review of radiographic studies, pulse oximetry and re-evaluation of patient's condition.   Initial Impression / Assessment and Plan / ED Course  I have reviewed the triage vital signs and the nursing notes.  Pertinent labs & imaging results that were available during my care of the patient were reviewed by me and considered in my medical decision making (see chart for details).    41yo female with history of smoking, htn, family hx of MI in father in his 51s, presents from PCP's office after she presented for initial visit and was found to be in congestive heart failure on XR.  Bedside US shows no sign of tamponade. Labs obtained at PCP  show elevated pro-BNP.  Patient with progressively worsening symptoms.  BP significantly elevated on arrival. Given nitro SL, lasix, with plan to start nitro gtt if it does not work.  Troponin elevated likely secondary to CHF.   Patient to be admitted to Hospitalist. Discussed with Cardiology on call, recommend ECHO.  Final Clinical Impressions(s) / ED Diagnoses   Final diagnoses:  Hypertensive emergency  Congestive heart failure, unspecified congestive heart failure chronicity, unspecified congestive heart failure type [facility])    New Prescriptions New Prescriptions   No medications on file     Alvira Monday, MD 02/22/17 [phone]

## 2017-02-21 NOTE — ED Triage Notes (Addendum)
Patient reports that she has had frequent leg swelling since January 2018 that has gotten progressively worse. Patient also reports swelling in her abdomen as well. Patient states she attempted to make an appointment with her physician but could see x 8 weeks. Patient also c/o SOB. Patient was seen by her physician today and told she had fluid in her lungs and around her heart.

## 2017-02-21 NOTE — Progress Notes (Signed)
Pre visit review using our clinic review tool, if applicable. No additional management support is needed unless otherwise documented below in the visit note. 

## 2017-02-21 NOTE — Progress Notes (Signed)
Subjective:    Patient ID: Amber Bentley, female    DOB: 1976/06/10, 41 y.o.   MRN: [id number]  Patient presents today for establish care (new patient)  HPI   Moved from [facility] to [place] 5years ago.  Anxiety and Depression: wellbutrin and ativan used in past Does not like psychotherapy  Immunizations: (TDAP, Hep C screen, Pneumovax, Influenza, zoster)  Health Maintenance  Topic Date Due  . HIV Screening  10/24/1993  . Pap Smear  10/25/1999  . Flu Shot  07/17/2016  . Tetanus Vaccine  09/16/2024   Diet:regular Weight:  Wt Readings from Last 3 Encounters:  02/21/17 (!) 344 lb (156 kg)    Exercise:none due to SOB and edema Fall Risk: Fall Risk  02/21/2017  Falls in the past year? No   Home Safety: home with wife Depression/Suicide: Depression screen [facility] 2/9 02/21/2017  Decreased Interest 0  Down, Depressed, Hopeless 0  PHQ - 2 Score 0   Pap Smear (every 33yrs for >21-29 without HPV, every 51yrs for >30-31yrs with HPV):s/p hysterectomy Sexual History (birth control, marital status, STD):sexually active, same-sex partner.  Medications and allergies reviewed with patient and updated if appropriate.  Patient Active Problem List   Diagnosis Date Noted  . HTN (hypertension), benign 02/21/2017  . Adjustment disorder with mixed anxiety and depressed mood 02/21/2017    No current outpatient prescriptions on file prior to visit.   No current facility-administered medications on file prior to visit.     Past Medical History:  Diagnosis Date  . Anxiety   . Depression     Past Surgical History:  Procedure Laterality Date  . ABDOMINAL HYSTERECTOMY      Social History   Social History  . Marital status: Single    Spouse name: N/A  . Number of children: N/A  . Years of education: N/A   Social History Main Topics  . Smoking status: Current Every Day Smoker    Packs/day: 0.25    Years: 10.00    Types: Cigarettes  . Smokeless tobacco: Never Used  . Alcohol use Yes   Comment: social  . Drug use: No  . Sexual activity: Not Asked   Other Topics Concern  . None   Social History Narrative  . None    Family History  Problem Relation Age of Onset  . Hypertension Mother   . Cancer Mother     breast cancer  . Diabetes Father   . Heart disease Father   . Hyperlipidemia Father   . Thyroid disease Sister   . Sudden death Brother   . Cancer Maternal Aunt     breast cancer  . Cancer Paternal Grandfather     brain cancer        Review of Systems  Constitutional: Negative for fever, malaise/fatigue and weight loss.  HENT: Negative for congestion and sore throat.   Eyes:       Negative for visual changes  Respiratory: Positive for cough and shortness of breath. Negative for sputum production.   Cardiovascular: Positive for chest pain, orthopnea, leg swelling and PND. Negative for palpitations.  Gastrointestinal: Negative for blood in stool, constipation, diarrhea, heartburn, nausea and vomiting.  Genitourinary: Negative for dysuria, frequency and urgency.  Musculoskeletal: Positive for joint pain. Negative for falls and myalgias.  Skin: Negative for rash.  Neurological: Positive for sensory change. Negative for dizziness and headaches.  Endo/Heme/Allergies: Does not bruise/bleed easily.  Psychiatric/Behavioral: Positive for depression. Negative for substance abuse and suicidal ideas. The patient  is nervous/anxious.     Objective:   Vitals:   02/21/17 1413  BP: (!) 170/100  Pulse: 98  Temp: 97.7 F (36.5 C)    Body mass index is 52.31 kg/m.   Physical Examination:  Physical Exam  Constitutional: She is oriented to person, place, and time and well-developed, well-nourished, and in no distress. No distress.  HENT:  Right Ear: External ear normal.  Left Ear: External ear normal.  Nose: Nose normal.  Mouth/Throat: Oropharynx is clear and moist. No oropharyngeal exudate.  Eyes: No scleral icterus.  Neck: Normal range of motion. Neck  supple. No thyromegaly present.  Cardiovascular: Normal rate and normal heart sounds.   Pulmonary/Chest: Effort normal and breath sounds normal. She exhibits no tenderness.  Abdominal: Soft. Bowel sounds are normal. She exhibits distension. There is no tenderness. There is no rebound.  Musculoskeletal: Normal range of motion. She exhibits edema. She exhibits no tenderness.  Lymphadenopathy:    She has no cervical adenopathy.  Neurological: She is alert and oriented to person, place, and time. Gait normal.  Skin: Skin is warm and dry. No erythema.  Psychiatric: Affect and judgment normal.  Vitals reviewed.   ASSESSMENT and PLAN:  Amber Bentley was seen today for establish care.  Diagnoses and all orders for this visit:  Encounter for medical examination to establish care  HTN (hypertension), benign -     EKG 12-Lead -     Comprehensive metabolic panel; Future -     B Nat Peptide; Future -     metoprolol succinate (TOPROL-XL) 25 MG 24 hr tablet; Take 1 tablet (25 mg total) by mouth daily. -     lisinopril (PRINIVIL,ZESTRIL) 10 MG tablet; Take 1 tablet (10 mg total) by mouth daily. -     hydrochlorothiazide (HYDRODIURIL) 25 MG tablet; Take 1 tablet (25 mg total) by mouth daily. -     potassium chloride SA (K-DUR,KLOR-CON) 20 MEQ tablet; Take 1 tablet (20 mEq total) by mouth daily.  Adjustment disorder with mixed anxiety and depressed mood  Fatigue, unspecified type -     TSH; Future -     Hemoglobin A1c; Future -     CBC w/Diff; Future -     DG Chest 2 View; Future  PND (paroxysmal nocturnal dyspnea) -     Comprehensive metabolic panel; Future -     B Nat Peptide; Future -     CBC w/Diff; Future -     DG Chest 2 View; Future  Localized edema -     Comprehensive metabolic panel; Future -     B Nat Peptide; Future -     TSH; Future -     CBC w/Diff; Future -     DG Chest 2 View; Future  Morbid obesity (HCC) -     Hemoglobin A1c; Future -     CBC w/Diff; Future    No  problem-specific Assessment & Plan notes found for this encounter.    Follow up: Return in about 1 week (around 02/28/2017) for HTN and edema.  Alysia Penna, NP

## 2017-02-21 NOTE — H&P (Addendum)
History and Physical    Amber Bentley UUV:[id number] DOB: 05/09/1976 DOA: 02/21/2017  Referring MD/NP/PA:   PCP: Alysia Penna, NP   Patient coming from:  The patient is coming from home.  At baseline, pt is independent for most of ADL.   Chief Complaint: bilateral leg edema, SOB and chest pain  HPI: Amber Bentley is a 41 y.o. female with medical history significant of hypertension, tobacco abuse, depression, anxiety, medication noncompliance, who presents with bilateral leg edema, shortness breath and chest pain.  Patient states that she has been having intermittent chest pain and worsening bilateral leg edema in the past 8 weeks. Her chest pain is located in the substernal area, intermittent, happens 3-4 times per week, each time lasted for a few minutes to a few hours. It is sharp, nonradiating. It is aggravated by exertion, not by deep breath. No recent long distant traveling. She has mild dry cough. No fever or chills. She has nausea, currently no vomiting, diarrhea or abdominal pain. She denies symptoms of UTI or unilateral weakness. Patient was seen by her PCP today, and told her that she has fluid built up in her lung and started her with lisinopril and metoprolol. Pt states that she has never taken bp meds.  ED Course: pt was found to have Bp 243/130, BNP 1145, negative troponin, WBC 7.9, TSH 4.78, A1c 5.8, electrolytes renal function okay, temperature normal, tachycardia, oxygen saturation 92-98% on room air. Chest x-ray showed mild interstitial pulmonary edema, questionable pericardial effusion. ED physician did bedside ultrasound, which showed trace amount of pericardial effusion without evidence of tamponade. Pt is admitted to SDU as inpt. Nitro gtt and IV lasix were started.  Review of Systems:   General: no fevers, chills, has fatigue HEENT: no blurry vision, hearing changes or sore throat Respiratory: has dyspnea, coughing, no wheezing CV: has chest pain, no palpitations GI: has  nausea, no vomiting, abdominal pain, diarrhea, constipation GU: no dysuria, burning on urination, increased urinary frequency, hematuria  Ext: has leg edema Neuro: no unilateral weakness, numbness, or tingling, no vision change or hearing loss Skin: no rash, no skin tear. MSK: No muscle spasm, no deformity, no limitation of range of movement in spin Heme: No easy bruising.  Travel history: No recent long distant travel.  Allergy:  Allergies  Allergen Reactions  . 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   [name] [Naproxen] Other (See Comments)    Pt states that this medication makes her loopy.     Past Medical History:  Diagnosis Date  . Anxiety   . Depression     Past Surgical History:  Procedure Laterality Date  . ABDOMINAL HYSTERECTOMY      Social History:  reports that she has been smoking Cigarettes.  She has a 2.50 pack-year smoking history. She has never used smokeless tobacco. She reports that she drinks alcohol. She reports that she does not use drugs.  Family History:  Family History  Problem Relation Age of Onset  . Hypertension Mother   . Cancer Mother     breast cancer  . Diabetes Father   . Heart disease Father   . Hyperlipidemia Father   . Thyroid disease Sister   . Sudden death Brother   . Cancer Maternal Aunt     breast cancer  . Cancer Paternal Grandfather     brain cancer     Prior to Admission medications   Medication Sig Start Date End Date  Taking? Authorizing Provider  lisinopril (PRINIVIL,ZESTRIL) 10 MG tablet Take 1 tablet (10 mg total) by mouth daily. 02/21/17   Anne Ng, NP  metoprolol succinate (TOPROL-XL) 25 MG 24 hr tablet Take 1 tablet (25 mg total) by mouth daily. 02/21/17   Anne Ng, NP  potassium chloride SA (K-DUR,KLOR-CON) 20 MEQ tablet Take 1 tablet (20 mEq total) by mouth daily. 02/21/17   Anne Ng, NP    Physical Exam: Vitals:   02/21/17 1751 02/21/17 1917 02/21/17  1947  BP: (!) 236/134 (!) 238/150 (!) 243/130  Pulse: 101 90 88  Resp: 20 23 21   Temp: 98 F (36.7 C)    TempSrc: Oral    SpO2: 95% 96% 92%   General: Not in acute distress HEENT:       Eyes: PERRL, EOMI, no scleral icterus.       ENT: No discharge from the ears and nose, no pharynx injection, no tonsillar enlargement.        Neck: Difficult to assess JVD due to obesity, no bruit, no mass felt. Heme: No neck lymph node enlargement. Cardiac: S1/S2, RRR, tachycardia, No murmurs, No gallops or rubs. Respiratory: No rales, wheezing, rhonchi or rubs. GI: Soft, nondistended, nontender, no rebound pain, no organomegaly, BS present. GU: No hematuria Ext: 3+ pitting leg edema bilaterally. 2+DP/PT pulse bilaterally. Musculoskeletal: No joint deformities, No joint redness or warmth, no limitation of ROM in spin. Skin: No rashes.  Neuro: Alert, oriented X3, cranial nerves II-XII grossly intact, moves all extremities normally.  Psych: Patient is not psychotic, no suicidal or hemocidal ideation.  Labs on Admission: I have personally reviewed following labs and imaging studies  CBC:  Recent Labs Lab 02/21/17 1510  WBC 7.9  NEUTROABS 5.7  HGB 11.9*  HCT 39.6  MCV 79.8  PLT 214.0   Basic Metabolic Panel:  Recent Labs Lab 02/21/17 1510  NA 138  K 3.8  CL 103  CO2 30  GLUCOSE 99  BUN 18  CREATININE 1.10  CALCIUM 9.2   GFR: Estimated Creatinine Clearance: 110.2 mL/min (by C-G formula based on SCr of 1.1 mg/dL). Liver Function Tests:  Recent Labs Lab 02/21/17 1510  AST 13  ALT 12  ALKPHOS 70  BILITOT 0.6  PROT 6.5  ALBUMIN 3.6   No results for input(s): LIPASE, AMYLASE in the last 168 hours. No results for input(s): AMMONIA in the last 168 hours. Coagulation Profile: No results for input(s): INR, PROTIME in the last 168 hours. Cardiac Enzymes: No results for input(s): CKTOTAL, CKMB, CKMBINDEX, TROPONINI in the last 168 hours. BNP (last 3 results)  Recent Labs   02/21/17 1510  PROBNP 1,145.0*   HbA1C:  Recent Labs  02/21/17 1510  HGBA1C 5.8   CBG: No results for input(s): GLUCAP in the last 168 hours. Lipid Profile: No results for input(s): CHOL, HDL, LDLCALC, TRIG, CHOLHDL, LDLDIRECT in the last 72 hours. Thyroid Function Tests:  Recent Labs  02/21/17 1510  TSH 4.78*   Anemia Panel: No results for input(s): VITAMINB12, FOLATE, FERRITIN, TIBC, IRON, RETICCTPCT in the last 72 hours. Urine analysis: No results found for: COLORURINE, APPEARANCEUR, LABSPEC, PHURINE, GLUCOSEU, HGBUR, BILIRUBINUR, KETONESUR, PROTEINUR, UROBILINOGEN, NITRITE, LEUKOCYTESUR Sepsis Labs: @LABRCNTIP (procalcitonin:4,lacticidven:4) )No results found for this or any previous visit (from the past 240 hour(s)).   Radiological Exams on Admission: Dg Chest 2 View  Result Date: 02/21/2017 CLINICAL DATA:  41 y/o female with cough fatigue congestion and fluid retention for 1 month. Initial encounter. Smoker. EXAM: CHEST  2 VIEW COMPARISON:  None. FINDINGS: Large body habitus. Moderate to severe cardiomegaly. No pneumothorax, pleural effusion or consolidation, but there is peripheral linear increased mostly interstitial appearing opacity in both lungs, more apparent on the left. No acute osseous abnormality identified. Negative visible bowel gas pattern. IMPRESSION: 1. Mild pulmonary interstitial edema suspected. No pleural effusion. 2. Moderate to severe cardiomegaly.  Consider pericardial effusion. Electronically Signed   By: Odessa Fleming M.D.   On: 02/21/2017 15:35     EKG: Independently reviewed.    Sinus rhythm, QTC 466, mild T-wave inversion in inferior leads and V5 to V6, LAE  Assessment/Plan Principal Problem:   Hypertensive emergency Active Problems:   Acute CHF (congestive heart failure) (HCC)   Tobacco abuse   Chest pain   Anxiety and depression   Hypertensive emergency: Patient has significantly elevated blood pressure, and acute congestive heart failure,  consistent with hypertensive emergency. This is due to medication noncompliance.  -Will admit to SDU -start Nitroglycerin drip for now, the of goal of bp reduction is by about 25 to 30% in first several hours, at SBP 170 to 180 mmHg. - Continue Lisinopril and metoprolol - add amlodipine, 10 mg daily - IV lasix for acute CHF  Acute CHF (congestive heart failure) [facility]): Patient has worsening bilateral leg edema, elevated BNP and chest x-ray findings of interstitial pulmonary edema, consistence with CHF. Not sure which type of CHF, but likely due to diastolic CHF given uncontrolled Bp.  -Lasix 60 mg bid by IV - give one dose of KCl, 40 mEQ now (her K is 3.8 on admission) -trop x 3 -2d echo -metoprolol, ASA, lisinopril -Daily weights -strict I/O's -Low salt diet  Chest pain: likely due to demand ischemia 2/2 of CHF and elevate Bp. TSH 4.78, A1c 5.8 - cycle CE q6 x3 and repeat her EKG in the am  -on Nitroglycerin gtt -prn Morphine, and aspirin -will check FLP, free T4 and T3 - 2d echo  Tobacco abuse: -Did counseling about importance of quitting smoking -Nicotine patch  Hx of Anxiety and depression: not taking meds at home. Stable, no suicidal or homicidal ideations. -prn Xanax  DVT ppx: SQ Lovenox Code Status: Full code Family Communication:  Yes, patient's sisterat bed side Disposition Plan:  Anticipate discharge back to previous home environment Consults called:  none Admission status: SDU/inpation       Date of Service 02/21/2017    Lorretta Harp [facility] Pager [phone]  If 7PM-7AM, please contact night-coverage [email or web address] Password [facility] 02/21/2017, 8:23 PM

## 2017-02-21 NOTE — ED Notes (Signed)
ED Provider at bedside for bedside ultrasound

## 2017-02-22 ENCOUNTER — Inpatient Hospital Stay (HOSPITAL_COMMUNITY): Payer: 59

## 2017-02-22 DIAGNOSIS — I160 Hypertensive urgency: Secondary | ICD-10-CM

## 2017-02-22 DIAGNOSIS — I509 Heart failure, unspecified: Secondary | ICD-10-CM

## 2017-02-22 DIAGNOSIS — I161 Hypertensive emergency: Principal | ICD-10-CM

## 2017-02-22 DIAGNOSIS — I43 Cardiomyopathy in diseases classified elsewhere: Secondary | ICD-10-CM

## 2017-02-22 DIAGNOSIS — F418 Other specified anxiety disorders: Secondary | ICD-10-CM

## 2017-02-22 DIAGNOSIS — Z720 Tobacco use: Secondary | ICD-10-CM

## 2017-02-22 DIAGNOSIS — I110 Hypertensive heart disease with heart failure: Secondary | ICD-10-CM

## 2017-02-22 LAB — BASIC METABOLIC PANEL
Anion gap: 5 (ref 5–15)
BUN: 17 mg/dL (ref 6–20)
CALCIUM: 8.5 mg/dL — AB (ref 8.9–10.3)
CO2: 33 mmol/L — AB (ref 22–32)
CREATININE: 1.1 mg/dL — AB (ref 0.44–1.00)
Chloride: 103 mmol/L (ref 101–111)
GFR calc non Af Amer: >60 mL/min (ref >60)
GLUCOSE: 112 mg/dL — AB (ref 65–99)
Potassium: 4 mmol/L (ref 3.5–5.1)
Sodium: 141 mmol/L (ref 135–145)

## 2017-02-22 LAB — LIPID PANEL
CHOL/HDL RATIO: 4.7 ratio
Cholesterol: 137 mg/dL (ref 0–200)
HDL: 29 mg/dL — ABNORMAL LOW (ref >40)
LDL CALC: 97 mg/dL (ref 0–99)
Triglycerides: 54 mg/dL (ref <150)
VLDL: 11 mg/dL (ref 0–40)

## 2017-02-22 LAB — ECHOCARDIOGRAM COMPLETE
HEIGHTINCHES: 68 in
Weight: 5504 oz

## 2017-02-22 LAB — TROPONIN I
TROPONIN I: 0.04 ng/mL — AB (ref <0.03)
TROPONIN I: 0.05 ng/mL — AB (ref <0.03)

## 2017-02-22 LAB — T3, FREE: T3 FREE: 2.5 pg/mL (ref 2.0–4.4)

## 2017-02-22 LAB — GLUCOSE, CAPILLARY: GLUCOSE-CAPILLARY: 111 mg/dL — AB (ref 65–99)

## 2017-02-22 LAB — T4, FREE: Free T4: 1.02 ng/dL (ref 0.61–1.12)

## 2017-02-22 MED ORDER — AMLODIPINE BESYLATE 5 MG PO TABS
5.0000 mg | ORAL_TABLET | Freq: Every day | ORAL | Status: DC
  Administered 2017-02-22 – 2017-02-25 (×4): 5 mg via ORAL
  Filled 2017-02-22 (×4): qty 1

## 2017-02-22 MED ORDER — HYDRALAZINE HCL 20 MG/ML IJ SOLN
10.0000 mg | INTRAMUSCULAR | Status: DC | PRN
  Administered 2017-02-22 – 2017-02-24 (×2): 10 mg via INTRAVENOUS
  Filled 2017-02-22 (×2): qty 1

## 2017-02-22 MED ORDER — PANTOPRAZOLE SODIUM 40 MG PO TBEC
40.0000 mg | DELAYED_RELEASE_TABLET | Freq: Every day | ORAL | Status: DC
  Administered 2017-02-23: 40 mg via ORAL

## 2017-02-22 MED ORDER — CARVEDILOL 12.5 MG PO TABS
12.5000 mg | ORAL_TABLET | Freq: Two times a day (BID) | ORAL | Status: DC
  Administered 2017-02-22 – 2017-02-25 (×7): 12.5 mg via ORAL
  Filled 2017-02-22 (×7): qty 1

## 2017-02-22 MED ORDER — CARVEDILOL 6.25 MG PO TABS: 6.2500 mg | ORAL_TABLET | Freq: Two times a day (BID) | ORAL | Status: DC

## 2017-02-22 MED ORDER — LISINOPRIL 20 MG PO TABS
20.0000 mg | ORAL_TABLET | Freq: Every day | ORAL | Status: DC
  Administered 2017-02-22 – 2017-02-25 (×4): 20 mg via ORAL
  Filled 2017-02-22 (×4): qty 1

## 2017-02-22 NOTE — ED Notes (Signed)
Pt c/o continued headache, episode of vomiting and c/o mild dizziness. Hospitalist to bedside to evaluate. At this time, okay to start to wean nitro gtt to goal of 170-190 for tonight. Also will place order for hydralazine for when nitro has been weaned completely.

## 2017-02-22 NOTE — Consult Note (Signed)
CONSULT NOTE  Date: 02/22/2017               Patient Name:  Amber Bentley MRN: [id number]  DOB: [birth year] Age / Sex: 41 y.o., female        PCP: Alysia Penna Primary Cardiologist: Luvenia Heller            Referring Physician: [place]              Reason for Consult: Hypertensive emergency, medication noncompliance           History of Present Illness: Patient is a 41 y.o. female with a PMHx of HTN, Depression, Morbid obesity and medical noncompliance., who was admitted to [facility] on 02/21/2017 for evaluation of hypertensive emergency and shortness of breath.  The patient has had a history of hypertension for at least 4 years that we can document. She moved from [facility] several years ago. She has not taken her antihypertensives on a regular basis..   She presents to [facility] emergency room with intermittent chest pain and worsening bilateral leg edema for the past 8 weeks. The chest pain is a substernal pain that happens 3-4 times a week. The pain can last anywhere from 6 a few minutes to a few hours. It is sharp and nonradiating. It is not brought on by exertion and is not pleuritic.  She denies any fevers or chills. She has had a mild cough. She has not had a GI symptoms.  CP has been going on for months.   Tried to start walking and this caused her chest to hurt.   Did not have medical insurance until this year. Feeling much better this am  BP   She was seen by her primary medical doctor yesterday and was started on metoprolol and lisinopril.   The chest x-ray in the emergency room reveals mild pulmonary interstitial edema. There is moderate to severe cardiomegaly.    Echocardiogram has been ordered and will be done later today.  Just established with primary MD yesterday  Is a pre-school teacher  Does not watch the salt in her diet Wants to start walking    + family hx of HTN  Smokes 1/4 - 1/2 ppd    Medications: Outpatient medications:  (Not in a  hospital admission)  Current medications: Current Facility-Administered Medications  Medication Dose Route Frequency Provider Last Rate Last Dose  . 0.9 %  sodium chloride infusion  250 mL Intravenous PRN Lorretta Harp, MD      . acetaminophen (TYLENOL) tablet 650 mg  650 mg Oral Q4H PRN Lorretta Harp, MD   650 mg at 02/22/17 0208  . ALPRAZolam Prudy Feeler) tablet 0.25 mg  0.25 mg Oral BID PRN Lorretta Harp, MD      . amLODipine (NORVASC) tablet 10 mg  10 mg Oral Daily Lorretta Harp, MD   10 mg at 02/21/17 2142  . aspirin tablet 325 mg  325 mg Oral Daily Lorretta Harp, MD   325 mg at 02/21/17 2142  . enoxaparin (LOVENOX) injection 40 mg  40 mg Subcutaneous Q24H Lorretta Harp, MD   40 mg at 02/21/17 2152  . furosemide (LASIX) injection 60 mg  60 mg Intravenous BID Lorretta Harp, MD   60 mg at 02/22/17 0803  . hydrALAZINE (APRESOLINE) injection 10 mg  10 mg Intravenous Q2H PRN Lorretta Harp, MD   10 mg at 02/22/17 0441  . lisinopril (PRINIVIL,ZESTRIL) tablet 10 mg  10 mg Oral Daily Lorretta Harp,  MD   10 mg at 02/21/17 2151  . metoprolol succinate (TOPROL-XL) 24 hr tablet 25 mg  25 mg Oral Daily Lorretta Harp, MD   25 mg at 02/21/17 2151  . morphine 4 MG/ML injection 2 mg  2 mg Intravenous Q4H PRN Lorretta Harp, MD   2 mg at 02/21/17 2135  . nicotine (NICODERM CQ - dosed in mg/24 hours) patch 21 mg  21 mg Transdermal Daily Lorretta Harp, MD      . nitroGLYCERIN (NITROSTAT) SL tablet 0.4 mg  0.4 mg Sublingual Q5 min PRN Alvira Monday, MD   0.4 mg at 02/21/17 1941  . nitroGLYCERIN 50 mg in dextrose 5 % 250 mL (0.2 mg/mL) infusion  2-200 mcg/min Intravenous Titrated Lorretta Harp, MD   Stopped at 02/22/17 [phone]  . ondansetron (ZOFRAN) injection 4 mg  4 mg Intravenous Q6H PRN Lorretta Harp, MD   4 mg at 02/21/17 2142  . sodium chloride flush (NS) 0.9 % injection 3 mL  3 mL Intravenous Q12H Lorretta Harp, MD      . sodium chloride flush (NS) 0.9 % injection 3 mL  3 mL Intravenous PRN Lorretta Harp, MD      . zolpidem (AMBIEN) tablet 5 mg  5 mg Oral QHS PRN Lorretta Harp,  MD       Current Outpatient Prescriptions  Medication Sig Dispense Refill  . ibuprofen (ADVIL,MOTRIN) 200 MG tablet Take 1,000 mg by mouth every 6 (six) hours as needed for headache, mild pain or moderate pain.    [name] lisinopril (PRINIVIL,ZESTRIL) 10 MG tablet Take 1 tablet (10 mg total) by mouth daily. 30 tablet 3  . metoprolol succinate (TOPROL-XL) 25 MG 24 hr tablet Take 1 tablet (25 mg total) by mouth daily. 30 tablet 3  . potassium chloride SA (K-DUR,KLOR-CON) 20 MEQ tablet Take 1 tablet (20 mEq total) by mouth daily. 30 tablet 3     Allergies  Allergen Reactions  . 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   [name] [Naproxen] Other (See Comments)    Pt states that this medication makes her loopy.      Past Medical History:  Diagnosis Date  . Anxiety   . Depression     Past Surgical History:  Procedure Laterality Date  . ABDOMINAL HYSTERECTOMY      Family History  Problem Relation Age of Onset  . Hypertension Mother   . Cancer Mother     breast cancer  . Diabetes Father   . Heart disease Father   . Hyperlipidemia Father   . Thyroid disease Sister   . Sudden death Brother   . Cancer Maternal Aunt     breast cancer  . Cancer Paternal Grandfather     brain cancer    Social History:  reports that she has been smoking Cigarettes.  She has a 2.50 pack-year smoking history. She has never used smokeless tobacco. She reports that she drinks alcohol. She reports that she does not use drugs.   Review of Systems: Constitutional:  denies fever, chills, diaphoresis, appetite change and fatigue.  HEENT: denies photophobia, eye pain, redness, hearing loss, ear pain, congestion, sore throat, rhinorrhea, sneezing, neck pain, neck stiffness and tinnitus.  Respiratory: admits to SOB, DOE, cough, chest tightness, and wheezing.  Cardiovascular: admits to chest pain, palpitations and leg swelling.  Gastrointestinal: admits to nausea, vomiting on  occasion   Genitourinary: denies dysuria, urgency, frequency, hematuria, flank pain and difficulty urinating.  Musculoskeletal: denies  myalgias, back pain, joint swelling, arthralgias and gait problem.   Skin: denies pallor, rash and wound.  Neurological: denies dizziness, seizures, syncope, weakness, light-headedness, numbness and headaches.   Hematological: denies adenopathy, easy bruising, personal or family bleeding history.  Psychiatric/ Behavioral: denies suicidal ideation, mood changes, confusion, nervousness, sleep disturbance and agitation.    Physical Exam: BP 161/93   Pulse 74   Temp 97.9 F (36.6 C) (Oral)   Resp 19   SpO2 95%   Wt Readings from Last 3 Encounters:  02/21/17 (!) 344 lb (156 kg)    General: Vital signs reviewed and noted.  Obese female   Head: Normocephalic, atraumatic, sclera anicteric,   Neck: Supple. Negative for carotid bruits. No JVD   Lungs:  Clear bilaterally, no  wheezes, rales, or rhonchi. Breathing is normal   Heart: RRR with S1 S2. No murmurs, rubs, or gallops   Abdomen/ GI :  Soft, non-tender, non-distended with normoactive bowel sounds. No hepatomegaly. No rebound/guarding. No obvious abdominal masses   MSK: Strength and the appear normal for age.   Extremities: No clubbing or cyanosis. 1-2 + bilateral  edema.  Distal pedal pulses are 2+ and equal   Neurologic:  CN are grossly intact,  No obvious motor or sensory defect.  Alert and oriented X 3. Moves all extremities spontaneously.  Psych: Responds to questions appropriately with a normal affect.     Lab results: Basic Metabolic Panel:  Recent Labs Lab 02/21/17 1510 02/22/17 0440  NA 138 141  K 3.8 4.0  CL 103 103  CO2 30 33*  GLUCOSE 99 112*  BUN 18 17  CREATININE 1.10 1.10*  CALCIUM 9.2 8.5*    Liver Function Tests:  Recent Labs Lab 02/21/17 1510  AST 13  ALT 12  ALKPHOS 70  BILITOT 0.6  PROT 6.5  ALBUMIN 3.6   No results for input(s): LIPASE, AMYLASE in the last  168 hours. No results for input(s): AMMONIA in the last 168 hours.  CBC:  Recent Labs Lab 02/21/17 1510  WBC 7.9  NEUTROABS 5.7  HGB 11.9*  HCT 39.6  MCV 79.8  PLT 214.0    Cardiac Enzymes:  Recent Labs Lab 02/21/17 2046 02/22/17 0221  TROPONINI 0.04* 0.04*    BNP: Invalid input(s): POCBNP  CBG: No results for input(s): GLUCAP in the last 168 hours.  Coagulation Studies: No results for input(s): LABPROT, INR in the last 72 hours.   Other results:  Personal review of EKG shows :  -   NSR with diffuse  TWI    Imaging: Dg Chest 2 View  Result Date: 02/21/2017 CLINICAL DATA:  41 y/o female with cough fatigue congestion and fluid retention for 1 month. Initial encounter. Smoker. EXAM: CHEST  2 VIEW COMPARISON:  None. FINDINGS: Large body habitus. Moderate to severe cardiomegaly. No pneumothorax, pleural effusion or consolidation, but there is peripheral linear increased mostly interstitial appearing opacity in both lungs, more apparent on the left. No acute osseous abnormality identified. Negative visible bowel gas pattern. IMPRESSION: 1. Mild pulmonary interstitial edema suspected. No pleural effusion. 2. Moderate to severe cardiomegaly.  Consider pericardial effusion. Electronically Signed   By: Odessa Fleming M.D.   On: 02/21/2017 15:35        Assessment & Plan:  1. Essential HTN:    Has had HTN for years  Has never been on BP meds  She was placed on amlodipine 10 mg a day and lisinopril 10 mg a day. The  amlodipine 10 mg a day is likely to cause more leg edema. I've reduced the amlodipine  dose to 5 mg a day and have increased the lisinopril to 20 g a day. I've also discontinued the metoprolol and have started carvedilol 6.25 mg twice a day. We can titrate that up as needed. She'll need to remain on Lasix and will also likely need potassium supplementation. An echocardiogram will be done later today.  She will follow-up with [name] in the office in several weeks.   Vesta Mixer, Montez Hageman., MD, [facility] 02/22/2017, 8:07 AM Office - [phone] Pager [phone]

## 2017-02-22 NOTE — Progress Notes (Signed)
  Echocardiogram 2D Echocardiogram has been performed.  Amber Bentley 02/22/2017, 9:55 AM

## 2017-02-22 NOTE — ED Notes (Signed)
Hospitalist at the bedside 

## 2017-02-22 NOTE — Progress Notes (Signed)
[facility] PROGRESS NOTE  Amber Bentley ZOX:[id number] DOB: 14-Feb-1976 DOA: 02/21/2017 PCP: Alysia Penna, NP  Interim summary and HPI 41 y.o. female with medical history significant of hypertension, tobacco abuse, depression, anxiety, medication noncompliance, who presents with bilateral leg edema, shortness breath and chest pain. Patient states that she has been having intermittent chest pain and worsening bilateral leg edema in the past 8 weeks. Her chest pain is located in the substernal area, intermittent, happens 3-4 times per week, each time lasted for a few minutes to a few hours. It is sharp, nonradiating. It is aggravated by exertion, not by deep breath. No recent long distant traveling.  Found to have interstitial pulmonary edema and emergency HTN crisis.  Assessment/Plan: Hypertensive emergency: Patient has significantly elevated blood pressure, and acute congestive heart failure, consistent with hypertensive emergency. 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  -TSH 4.78, A1c 5.8 -on EKG: diffuse TWI and sinus rhythm; no ST abnormalities. -NTG drip discontinued; patient BP better and no having continue CP now. -prn Morphine, and aspirin to be continued. -2D echo with diffuse hypokinesis, EF 45-50% and grade 2 diastolic dysfunction. Demonstrating hypertensive cardiomyopathy.  Tobacco abuse: -cessation counseling  provided -continue nicoderm  Hx of Anxiety and depression: not taking meds for depression at home.  -Stable, no suicidal or homicidal ideations present. -continue prn Xanax  Morbid obesity -with suspected OHS and OSA -will need sleep study as an outpatient -encourage to follow aggressive weight loss, increase physical activity and follow dieting  -Body mass index is 50.28 kg/m.   Code Status: Full Family Communication: no family at bedside Disposition Plan: remains inpatient, will continue IV lasix, continue adjusting antihypertensive agents, follow cardiology rec's.   Consultants:  Cardiology   Procedures:  2-D echo - Left ventricle: The cavity size was mildly dilated. Wall   thickness was increased in a pattern of moderate LVH. Systolic   function was mildly reduced. The estimated ejection fraction was   in the range of 45% to 50%. Diffuse hypokinesis. Features are   consistent with a pseudonormal left ventricular filling pattern,   with concomitant abnormal relaxation and increased filling   pressure (grade 2 diastolic dysfunction). Doppler parameters are   consistent with high ventricular filling pressure. - Aortic valve: Transvalvular velocity was within the normal range.   There was no stenosis. There was no regurgitation. - Mitral valve: Transvalvular velocity was within the normal range.   There was no evidence for stenosis. There was trivial   regurgitation. - Right ventricle: The cavity size was normal. Wall thickness was   normal. Systolic function was normal. - Atrial septum: No defect or patent foramen ovale was identified. - Tricuspid valve: There was mild regurgitation. - Pulmonary arteries: Systolic pressure was severely increased. PA   peak pressure: 59 mm Hg (S). - Pericardium, extracardiac: A small pericardial effusion was   identified circumferential to the heart.   Antibiotics:  None   HPI/Subjective: Afebrile, reports intermittent HA and  chest  discomfort. No nausea, no vomiting.  Objective: Vitals:   02/22/17 1158 02/22/17 1742  BP: (!) 129/54 135/65  Pulse: 73 67  Resp: 20   Temp: 97.5 F (36.4 C)     Intake/Output Summary (Last 24 hours) at 02/22/17 2218 Last data filed at 02/22/17 1856  Gross per 24 hour  Intake              180 ml  Output              800 ml  Net             -620 ml   Filed Weights   02/22/17 1158  Weight: (!) 150 kg (330 lb 11 oz)    Exam:   General:  Afebrile, complaining of intermittent HA's; no nausea, no vomiting and improved SOB. Also endorses intermittent chest discomfort.  Cardiovascular: S1 and S2, no rubs, no gallops, positive JVD  Respiratory: no wheezing, no frank crackles; decrease BS at bases  Abdomen: obese, soft, NT, positive BS  Musculoskeletal: 2++ edema bilaterally, no cyanosis  Data Reviewed: Basic Metabolic Panel:  Recent Labs Lab 02/21/17 1510 02/22/17 0440  NA 138 141  K 3.8 4.0  CL 103 103  CO2 30 33*  GLUCOSE 99 112*  BUN 18 17  CREATININE 1.10 1.10*  CALCIUM 9.2 8.5*   Liver Function Tests:  Recent Labs Lab 02/21/17 1510  AST 13  ALT 12  ALKPHOS 70  BILITOT 0.6  PROT 6.5  ALBUMIN 3.6   CBC:  Recent Labs Lab 02/21/17 1510  WBC 7.9  NEUTROABS 5.7  HGB 11.9*  HCT 39.6  MCV 79.8  PLT 214.0   Cardiac Enzymes:  Recent Labs Lab 02/21/17 2046 02/22/17 0221 02/22/17 0904  TROPONINI 0.04* 0.04* 0.05*    Recent Labs  02/21/17 1510  PROBNP 1,145.0*    Studies: Dg Chest 2 View  Result Date: 02/21/2017 CLINICAL DATA:  41 y/o female with cough fatigue congestion and fluid retention for 1 month. Initial encounter. Smoker. EXAM: CHEST  2 VIEW COMPARISON:  None. FINDINGS: Large body habitus. Moderate to severe cardiomegaly. No pneumothorax, pleural effusion or consolidation, but there is peripheral linear increased mostly interstitial appearing opacity in both lungs, more apparent on the left. No acute osseous abnormality  identified. Negative visible bowel gas pattern. IMPRESSION: 1. Mild pulmonary interstitial edema suspected. No pleural effusion. 2. Moderate to severe cardiomegaly.  Consider pericardial effusion. Electronically Signed   By: Odessa Fleming M.D.   On: 02/21/2017 15:35    Scheduled Meds: . amLODipine  5 mg Oral Daily  . aspirin  325 mg Oral Daily  . carvedilol  12.5 mg Oral BID WC  . enoxaparin (LOVENOX) injection  40 mg Subcutaneous Q24H  . furosemide  60 mg Intravenous BID  . lisinopril  20 mg Oral Daily  . nicotine  21 mg Transdermal Daily  . [START ON 02/23/2017] pantoprazole  40 mg Oral Q1200  . sodium chloride flush  3 mL Intravenous Q12H   Continuous Infusions: . nitroGLYCERIN Stopped (02/22/17 0432)    Principal Problem:   Hypertensive emergency Active Problems:   Acute CHF (congestive heart failure) (HCC)   Tobacco abuse   Chest pain   Anxiety and depression   Malignant hypertensive urgency    Time spent: 25 minutes    Vassie Loll  [facility] Pager [phone]. If 7PM-7AM, please contact night-coverage at [email or web address], password [facility] 02/22/2017, 10:18 PM  LOS: 1 day

## 2017-02-23 DIAGNOSIS — R0602 Shortness of breath: Secondary | ICD-10-CM

## 2017-02-23 LAB — BASIC METABOLIC PANEL
ANION GAP: 7 (ref 5–15)
BUN: 20 mg/dL (ref 6–20)
CALCIUM: 8.9 mg/dL (ref 8.9–10.3)
CO2: 34 mmol/L — AB (ref 22–32)
Chloride: 98 mmol/L — ABNORMAL LOW (ref 101–111)
Creatinine, Ser: 1.12 mg/dL — ABNORMAL HIGH (ref 0.44–1.00)
GLUCOSE: 97 mg/dL (ref 65–99)
POTASSIUM: 3.5 mmol/L (ref 3.5–5.1)
Sodium: 139 mmol/L (ref 135–145)

## 2017-02-23 LAB — HIV ANTIBODY (ROUTINE TESTING W REFLEX): HIV SCREEN 4TH GENERATION: NONREACTIVE

## 2017-02-23 MED ORDER — PANTOPRAZOLE SODIUM 40 MG PO TBEC
40.0000 mg | DELAYED_RELEASE_TABLET | Freq: Two times a day (BID) | ORAL | Status: DC
  Administered 2017-02-23 – 2017-02-25 (×4): 40 mg via ORAL
  Filled 2017-02-23 (×5): qty 1

## 2017-02-23 MED ORDER — METOCLOPRAMIDE HCL 5 MG/ML IJ SOLN
5.0000 mg | Freq: Four times a day (QID) | INTRAMUSCULAR | Status: DC | PRN
  Administered 2017-02-23: 5 mg via INTRAVENOUS
  Filled 2017-02-23: qty 2

## 2017-02-23 MED ORDER — POLYETHYLENE GLYCOL 3350 17 G PO PACK
17.0000 g | PACK | Freq: Every day | ORAL | Status: DC
Start: 2017-02-23 — End: 2017-02-25
  Administered 2017-02-23: 17 g via ORAL
  Filled 2017-02-23: qty 1

## 2017-02-23 MED ORDER — DOCUSATE SODIUM 100 MG PO CAPS
100.0000 mg | ORAL_CAPSULE | Freq: Two times a day (BID) | ORAL | Status: DC
  Administered 2017-02-23 – 2017-02-24 (×3): 100 mg via ORAL
  Filled 2017-02-23 (×4): qty 1

## 2017-02-23 NOTE — Progress Notes (Signed)
Pt seems to get very nauseated every time she gets up to use the bathroom. Also, RN assessed pt's O2 status off oxygen which dropped to 77% on RA.

## 2017-02-23 NOTE — Progress Notes (Signed)
[facility] PROGRESS NOTE  Amber Bentley YQM:[id number] DOB: 1976/04/27 DOA: 02/21/2017 PCP: Alysia Penna, NP  Interim summary and HPI 41 y.o. female with medical history significant of hypertension, tobacco abuse, depression, anxiety, medication noncompliance, who presents with bilateral leg edema, shortness breath and chest pain. Patient states that she has been having intermittent chest pain and worsening bilateral leg edema in the past 8 weeks. Her chest pain is located in the substernal area, intermittent, happens 3-4 times per week, each time lasted for a few minutes to a few hours. It is sharp, nonradiating. It is aggravated by exertion, not by deep breath. No recent long distant traveling.  Found to have interstitial pulmonary edema and emergency HTN crisis.  Assessment/Plan: Hypertensive emergency: Patient has significantly elevated blood pressure, and acute congestive heart failure, consistent with hypertensive emergency. 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  -TSH 4.78, A1c 5.8 -on EKG: diffuse TWI and sinus rhythm; no ST abnormalities. -NTG drip discontinued; patient BP better and no having continue CP now. -prn Morphine, and aspirin to be continued. -2D echo with diffuse hypokinesis, EF 45-50% and grade 2 diastolic dysfunction. (Demonstrating hypertensive cardiomyopathy). -cardiology looking to do coronary CTA (most  likely as an outpatient)  Tobacco abuse: -cessation counseling provided -continue nicoderm  Hx of Anxiety and depression: not taking meds for depression at home.  -Stable, no suicidal or homicidal ideations present. -continue prn Xanax  Morbid obesity -with suspected OHS and OSA -will need sleep study as an outpatient -encourage to follow aggressive weight loss, increase physical activity and follow dieting  -Body mass index is 50.21 kg/m.   Nausea/vomiting -most likely due to GERD -will adjust PPI to BID -PRN antiemetics and reglan for refractory symptoms   Code Status: Full Family Communication: no family at bedside Disposition Plan: remains inpatient, will continue IV lasix, continue adjusting antihypertensive agents, follow cardiology rec's.   Consultants:  Cardiology   Procedures:  2-D echo - Left ventricle: The cavity size was mildly dilated. Wall   thickness was increased in a pattern of moderate LVH. Systolic   function was mildly reduced. The estimated ejection fraction was   in the range of 45% to 50%. Diffuse hypokinesis. Features are   consistent with a pseudonormal left ventricular filling pattern,   with concomitant abnormal relaxation and increased filling   pressure (grade 2 diastolic dysfunction). Doppler parameters are   consistent with high ventricular filling pressure. - Aortic valve: Transvalvular velocity was within the normal range.   There was no stenosis. There was no regurgitation. - Mitral valve: Transvalvular velocity was within the normal range.   There was no evidence for stenosis. There was trivial   regurgitation. - Right ventricle: The cavity size was normal. Wall thickness was   normal. Systolic function was normal. - Atrial septum: No defect or patent foramen ovale was identified. - Tricuspid valve: There was mild regurgitation. - Pulmonary arteries: Systolic pressure was severely increased. PA   peak pressure: 59 mm Hg (  S). -  Pericardium, extracardiac: A small pericardial effusion was   identified circumferential to the heart.   Antibiotics:  None   HPI/Subjective: Afebrile, reports intermittent HA and chest discomfort. Endorses nausea and vomiting after meals. No focal deficit appreciated. Requiring O2 supplementation  Objective: Vitals:   02/23/17 0933 02/23/17 0941  BP: (!) 184/94   Pulse:  63  Resp:    Temp:      Intake/Output Summary (Last 24 hours) at 02/23/17 1336 Last data filed at 02/23/17 1200  Gross per 24 hour  Intake              300 ml  Output             3360 ml  Net            -3060 ml   Filed Weights   02/22/17 1158 02/23/17 0500  Weight: (!) 150 kg (330 lb 11 oz) (!) 149.8 kg (330 lb 4 oz)    Exam:   General:  Afebrile, continue complaining of intermittent HA's and chest discomfort; overall improved SOB, but requiring O2 supplementation. Also endorses episodes of nausea and vomiting, after meals.  Cardiovascular: S1 and S2, no rubs, no gallops, no JVD  Respiratory: no wheezing, no frank crackles; decrease BS at bases  Abdomen: obese, soft, NT, positive BS  Musculoskeletal: 1-2++ edema bilaterally, no cyanosis  Data Reviewed: Basic Metabolic Panel:  Recent Labs Lab 02/21/17 1510 02/22/17 0440 02/23/17 0541  NA 138 141 139  K 3.8 4.0 3.5  CL 103 103 98*  CO2 30 33* 34*  GLUCOSE 99 112* 97  BUN 18 17 20   CREATININE 1.10 1.10* 1.12*  CALCIUM 9.2 8.5* 8.9   Liver Function Tests:  Recent Labs Lab 02/21/17 1510  AST 13  ALT 12  ALKPHOS 70  BILITOT 0.6  PROT 6.5  ALBUMIN 3.6   CBC:  Recent Labs Lab 02/21/17 1510  WBC 7.9  NEUTROABS 5.7  HGB 11.9*  HCT 39.6  MCV 79.8  PLT 214.0   Cardiac Enzymes:  Recent Labs Lab 02/21/17 2046 02/22/17 0221 02/22/17 0904  TROPONINI 0.04* 0.04* 0.05*    Recent Labs  02/21/17 1510  PROBNP 1,145.0*    Studies: Dg Chest 2 View  Result Date: 02/21/2017 CLINICAL DATA:  41 y/o female with cough  fatigue congestion and fluid retention for 1 month. Initial encounter. Smoker. EXAM: CHEST  2 VIEW COMPARISON:  None. FINDINGS: Large body habitus. Moderate to severe cardiomegaly. No pneumothorax, pleural effusion or consolidation, but there is peripheral linear increased mostly interstitial appearing opacity in both lungs, more apparent on the left. No acute osseous abnormality identified. Negative visible bowel gas pattern. IMPRESSION: 1. Mild pulmonary interstitial edema suspected. No pleural effusion. 2. Moderate to severe cardiomegaly.  Consider pericardial effusion. Electronically Signed   By: Odessa Fleming M.D.   On: 02/21/2017 15:35    Scheduled Meds: . amLODipine  5 mg Oral Daily  . aspirin  325 mg Oral Daily  . carvedilol  12.5 mg Oral BID WC  . enoxaparin (LOVENOX) injection  40 mg Subcutaneous Q24H  . furosemide  60 mg Intravenous BID  . lisinopril  20 mg Oral Daily  . nicotine  21 mg Transdermal Daily  . pantoprazole  40 mg Oral BID  . sodium chloride flush  3 mL Intravenous Q12H   Continuous Infusions: . nitroGLYCERIN Stopped (02/22/17 0432)    Principal Problem:   Hypertensive emergency Active Problems:   Acute systolic congestive heart  failure (HCC)   Tobacco abuse   Chest pain   Anxiety and depression   Malignant hypertensive urgency   Time spent: 25 minutes   Vassie Loll  [facility] Pager [phone]. If 7PM-7AM, please contact night-coverage at [email or web address], password [facility] 02/23/2017, 1:36 PM  LOS: 2 days

## 2017-02-23 NOTE — Progress Notes (Signed)
Not able to get CTA done this weekend - no radiologist or cardiologist available to read. Would recommend if she continues to feel well, that we pursue an outpatient work-up for ischemia in the office.  I will see her again on rounds tomorrow, if she is not discharged prior to that.  Chrystie Nose, MD, [facility] Attending Cardiologist [facility]

## 2017-02-23 NOTE — Progress Notes (Signed)
DAILY PROGRESS NOTE  Subjective: No events overnight. Denies chest pain today. BP improved. Echo yesterday shows decreased LVEF to 45-50% with diffuse hypokinesis. There is a small posterior pericardial effusion. RVSP was 59 mmHg. She reported her father had his first heart attack in his 7's and had "very high" cholesterol and was diabetic, but a non-smoker. Fortunately, cholesterol overnight was not too high. Troponin borderline elevated with flat trend - more consistent with CHF.  Objective:  Temp:  [97.4 F (36.3 C)-97.7 F (36.5 C)] 97.4 F (36.3 C) (03/10 0500) Pulse Rate:  [59-84] 59 (03/10 0500) Resp:  [16-25] 20 (03/09 1158) BP: (129-170)/(54-96) 139/72 (03/10 0500) SpO2:  [93 %-100 %] 98 % (03/10 0500) Weight:  [330 lb 4 oz (149.8 kg)-330 lb 11 oz (150 kg)] 330 lb 4 oz (149.8 kg) (03/10 0500) Weight change:   Intake/Output from previous day: 03/09 0701 - 03/10 0700 In: 300 [P.O.:300] Out: 2060 [Urine:1700; Emesis/NG output:360]  Intake/Output from this shift: No intake/output data recorded.  Medications: No current facility-administered medications on file prior to encounter.    Current Outpatient Prescriptions on File Prior to Encounter  Medication Sig Dispense Refill  . lisinopril (PRINIVIL,ZESTRIL) 10 MG tablet Take 1 tablet (10 mg total) by mouth daily. 30 tablet 3  . metoprolol succinate (TOPROL-XL) 25 MG 24 hr tablet Take 1 tablet (25 mg total) by mouth daily. 30 tablet 3  . potassium chloride SA (K-DUR,KLOR-CON) 20 MEQ tablet Take 1 tablet (20 mEq total) by mouth daily. 30 tablet 3    Physical Exam: General appearance: alert and no distress Neck: no carotid bruit and no JVD Lungs: clear to auscultation bilaterally Heart: regular rate and rhythm, S1, S2 normal, no murmur, click, rub or gallop Abdomen: soft, non-tender; bowel sounds normal; no masses,  no organomegaly and obese Extremities: extremities normal, atraumatic, no cyanosis or edema Pulses: 2+  and symmetric Skin: Skin color, texture, turgor normal. No rashes or lesions Neurologic: Grossly normal Psych: Pleasant  Lab Results: Results for orders placed or performed during the hospital encounter of 02/21/17 (from the past 48 hour(s))  T4, free     Status: None   Collection Time: 02/21/17  7:32 PM  Result Value Ref Range   Free T4 1.02 0.61 - 1.12 ng/dL    Comment: (NOTE) Biotin ingestion may interfere with free T4 tests. If the results are inconsistent with the TSH level, previous test results, or the clinical presentation, then consider biotin interference. If needed, order repeat testing after stopping biotin. Performed at [facility], [place] [address], [place], [place] [address]   T3, free     Status: None   Collection Time: 02/21/17  7:32 PM  Result Value Ref Range   T3, Free 2.5 2.0 - 4.4 pg/mL    Comment: (NOTE) Performed At: [facility] [address] [place], [place] [id number] Lindon Romp MD QG:[id number]   I-Stat Troponin, ED (not at [facility])     Status: None   Collection Time: 02/21/17  7:44 PM  Result Value Ref Range   Troponin i, poc 0.03 0.00 - 0.08 ng/mL   Comment 3            Comment: Due to the release kinetics of cTnI, a negative result within the first hours of the onset of symptoms does not rule out myocardial infarction with certainty. If myocardial infarction is still suspected, repeat the test at appropriate intervals.   Troponin I (q 6hr x 3)  Status: Abnormal   Collection Time: 02/21/17  8:46 PM  Result Value Ref Range   Troponin I 0.04 (HH) <0.03 ng/mL    Comment: CRITICAL RESULT CALLED TO, READ BACK BY AND VERIFIED WITH: L.ADKINS RN AT 2138 02/21/17 BY N.THOMPSON   Rapid urine drug screen (hospital performed)     Status: None   Collection Time: 02/21/17  8:46 PM  Result Value Ref Range   Opiates NONE DETECTED NONE DETECTED   Cocaine NONE DETECTED NONE DETECTED   Benzodiazepines NONE DETECTED NONE DETECTED    Amphetamines NONE DETECTED NONE DETECTED   Tetrahydrocannabinol NONE DETECTED NONE DETECTED   Barbiturates NONE DETECTED NONE DETECTED    Comment:        DRUG SCREEN FOR MEDICAL PURPOSES ONLY.  IF CONFIRMATION IS NEEDED FOR ANY PURPOSE, NOTIFY LAB WITHIN 5 DAYS.        LOWEST DETECTABLE LIMITS FOR URINE DRUG SCREEN Drug Class       Cutoff (ng/mL) Amphetamine      1000 Barbiturate      200 Benzodiazepine   169 Tricyclics       678 Opiates          300 Cocaine          300 THC              50   HIV antibody (Routine Testing)     Status: None   Collection Time: 02/21/17  8:46 PM  Result Value Ref Range   HIV Screen 4th Generation wRfx Non Reactive Non Reactive    Comment: (NOTE) Performed At: [facility] [place], [place] [id number] Lindon Romp MD WC:[id number]   Troponin I (q 6hr x 3)     Status: Abnormal   Collection Time: 02/22/17  2:21 AM  Result Value Ref Range   Troponin I 0.04 (HH) <0.03 ng/mL    Comment: CRITICAL VALUE NOTED.  VALUE IS CONSISTENT WITH PREVIOUSLY REPORTED AND CALLED VALUE.  Lipid panel     Status: Abnormal   Collection Time: 02/22/17  4:20 AM  Result Value Ref Range   Cholesterol 137 0 - 200 mg/dL   Triglycerides 54 <150 mg/dL   HDL 29 (L) >40 mg/dL   Total CHOL/HDL Ratio 4.7 RATIO   VLDL 11 0 - 40 mg/dL   LDL Cholesterol 97 0 - 99 mg/dL    Comment:        Total Cholesterol/HDL:CHD Risk Coronary Heart Disease Risk Table                     Men   Women  1/2 Average Risk   3.4   3.3  Average Risk       5.0   4.4  2 X Average Risk   9.6   7.1  3 X Average Risk  23.4   11.0        Use the calculated Patient Ratio above and the CHD Risk Table to determine the patient's CHD Risk.        ATP III CLASSIFICATION (LDL):  <100     mg/dL   Optimal  100-129  mg/dL   Near or Above                    Optimal  130-159  mg/dL   Borderline  160-189  mg/dL   High  >190     mg/dL   Very High Performed at [facility]  [facility],  [address] [place], [place] [address]   Basic metabolic panel     Status: Abnormal   Collection Time: 02/22/17  4:40 AM  Result Value Ref Range   Sodium 141 135 - 145 mmol/L   Potassium 4.0 3.5 - 5.1 mmol/L   Chloride 103 101 - 111 mmol/L   CO2 33 (H) 22 - 32 mmol/L   Glucose, Bld 112 (H) 65 - 99 mg/dL   BUN 17 6 - 20 mg/dL   Creatinine, Ser 1.10 (H) 0.44 - 1.00 mg/dL   Calcium 8.5 (L) 8.9 - 10.3 mg/dL   GFR calc non Af Amer >60 >60 mL/min   GFR calc Af Amer >60 >60 mL/min    Comment: (NOTE) The eGFR has been calculated using the CKD EPI equation. This calculation has not been validated in all clinical situations. eGFR's persistently <60 mL/min signify possible Chronic Kidney Disease.    Anion gap 5 5 - 15  Troponin I (q 6hr x 3)     Status: Abnormal   Collection Time: 02/22/17  9:04 AM  Result Value Ref Range   Troponin I 0.05 (HH) <0.03 ng/mL    Comment: CRITICAL VALUE NOTED.  VALUE IS CONSISTENT WITH PREVIOUSLY REPORTED AND CALLED VALUE.  Glucose, capillary     Status: Abnormal   Collection Time: 02/22/17 10:22 PM  Result Value Ref Range   Glucose-Capillary 111 (H) 65 - 99 mg/dL  Basic metabolic panel     Status: Abnormal   Collection Time: 02/23/17  5:41 AM  Result Value Ref Range   Sodium 139 135 - 145 mmol/L   Potassium 3.5 3.5 - 5.1 mmol/L   Chloride 98 (L) 101 - 111 mmol/L   CO2 34 (H) 22 - 32 mmol/L   Glucose, Bld 97 65 - 99 mg/dL   BUN 20 6 - 20 mg/dL   Creatinine, Ser 1.12 (H) 0.44 - 1.00 mg/dL   Calcium 8.9 8.9 - 10.3 mg/dL   GFR calc non Af Amer >60 >60 mL/min   GFR calc Af Amer >60 >60 mL/min    Comment: (NOTE) The eGFR has been calculated using the CKD EPI equation. This calculation has not been validated in all clinical situations. eGFR's persistently <60 mL/min signify possible Chronic Kidney Disease.    Anion gap 7 5 - 15    Imaging: Dg Chest 2 View  Result Date: 02/21/2017 CLINICAL DATA:  41 y/o female with cough fatigue congestion and  fluid retention for 1 month. Initial encounter. Smoker. EXAM: CHEST  2 VIEW COMPARISON:  None. FINDINGS: Large body habitus. Moderate to severe cardiomegaly. No pneumothorax, pleural effusion or consolidation, but there is peripheral linear increased mostly interstitial appearing opacity in both lungs, more apparent on the left. No acute osseous abnormality identified. Negative visible bowel gas pattern. IMPRESSION: 1. Mild pulmonary interstitial edema suspected. No pleural effusion. 2. Moderate to severe cardiomegaly.  Consider pericardial effusion. Electronically Signed   By: Genevie Ann M.D.   On: 02/21/2017 15:35    Assessment:  Principal Problem:   Hypertensive emergency Active Problems:   Acute systolic congestive heart failure (HCC)   Tobacco abuse   Chest pain   Anxiety and depression   Malignant hypertensive urgency   Plan:  1. Improved overnight. Weight is unchanged. Blood pressure improved with medication adjustment. Lipid profile shows predominantly low HDL, however, LDL-C <100 - does not suggest FH. She will need a coronary evaluation. We discussed stress testing versus coronary CTA. I think coronary CTA would  be most valuable to assess her arteries given her chest pain which is atypical, but to help to rule out an ischemic cause of her cardiomyopathy.   Time Spent Directly with Patient:  30 minutes  Length of Stay:  LOS: 2 days   Pixie Casino, MD, [facility] Attending Cardiologist [place] 02/23/2017, 8:00 AM

## 2017-02-23 NOTE — Progress Notes (Signed)
Patient C/O headache, then dizziness, chest pain and Nausea after going to the bathroom. PRN tylenol and morphine given, Dr. Gwenlyn Perking notified. Patient in bed resting, will continue to monitor patient.

## 2017-02-24 ENCOUNTER — Inpatient Hospital Stay (HOSPITAL_COMMUNITY): Payer: 59

## 2017-02-24 DIAGNOSIS — I428 Other cardiomyopathies: Secondary | ICD-10-CM

## 2017-02-24 DIAGNOSIS — J810 Acute pulmonary edema: Secondary | ICD-10-CM

## 2017-02-24 DIAGNOSIS — I5021 Acute systolic (congestive) heart failure: Secondary | ICD-10-CM

## 2017-02-24 DIAGNOSIS — I43 Cardiomyopathy in diseases classified elsewhere: Secondary | ICD-10-CM

## 2017-02-24 DIAGNOSIS — R4182 Altered mental status, unspecified: Secondary | ICD-10-CM

## 2017-02-24 DIAGNOSIS — I110 Hypertensive heart disease with heart failure: Secondary | ICD-10-CM

## 2017-02-24 DIAGNOSIS — J811 Chronic pulmonary edema: Secondary | ICD-10-CM

## 2017-02-24 DIAGNOSIS — I509 Heart failure, unspecified: Secondary | ICD-10-CM

## 2017-02-24 LAB — BASIC METABOLIC PANEL
ANION GAP: 7 (ref 5–15)
BUN: 20 mg/dL (ref 6–20)
CHLORIDE: 98 mmol/L — AB (ref 101–111)
CO2: 34 mmol/L — AB (ref 22–32)
Calcium: 8.5 mg/dL — ABNORMAL LOW (ref 8.9–10.3)
Creatinine, Ser: 1.16 mg/dL — ABNORMAL HIGH (ref 0.44–1.00)
GFR calc non Af Amer: 59 mL/min — ABNORMAL LOW (ref >60)
GLUCOSE: 92 mg/dL (ref 65–99)
Potassium: 3.6 mmol/L (ref 3.5–5.1)
Sodium: 139 mmol/L (ref 135–145)

## 2017-02-24 LAB — GLUCOSE, CAPILLARY: Glucose-Capillary: 94 mg/dL (ref 65–99)

## 2017-02-24 MED ORDER — SUMATRIPTAN SUCCINATE 6 MG/0.5ML ~~LOC~~ SOLN
6.0000 mg | Freq: Once | SUBCUTANEOUS | Status: AC
  Administered 2017-02-24: 6 mg via SUBCUTANEOUS
  Filled 2017-02-24 (×2): qty 0.5

## 2017-02-24 MED ORDER — FUROSEMIDE 40 MG PO TABS
40.0000 mg | ORAL_TABLET | Freq: Every day | ORAL | Status: DC
  Administered 2017-02-24 – 2017-02-25 (×2): 40 mg via ORAL
  Filled 2017-02-24 (×2): qty 1

## 2017-02-24 NOTE — Progress Notes (Signed)
[facility] PROGRESS NOTE  Amber Bentley WUJ:[id number] DOB: 03-17-1976 DOA: 02/21/2017 PCP: Alysia Penna, NP  Interim summary and HPI 41 y.o. female with medical history significant of hypertension, tobacco abuse, depression, anxiety, medication noncompliance, who presents with bilateral leg edema, shortness breath and chest pain. Patient states that she has been having intermittent chest pain and worsening bilateral leg edema in the past 8 weeks. Her chest pain is located in the substernal area, intermittent, happens 3-4 times per week, each time lasted for a few minutes to a few hours. It is sharp, nonradiating. It is aggravated by exertion, not by deep breath. No recent long distant traveling.  Found to have interstitial pulmonary edema and emergency HTN crisis.  Assessment/Plan: Hypertensive emergency: Patient has significantly elevated blood pressure, and acute congestive heart failure, consistent with hypertensive emergency. -has been hypertensive for many years and never took medication -started on amlodipine, lisinopril, coreg and lasix. 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  -TSH 4.78, A1c 5.8 -on EKG: diffuse TWI and sinus rhythm; no ST abnormalities. -NTG drip discontinued; patient BP better and no having continue CP now. -prn Morphine, and aspirin to be continued. -2D echo with diffuse hypokinesis, EF 45-50% and grade 2 diastolic dysfunction. (Demonstrating hypertensive cardiomyopathy). -cardiology looking to do coronary CTA (most likely as an  outpatient)  Tobacco abuse: -cessation counseling provided -continue nicoderm  Hx of Anxiety and depression: not taking meds for depression at home.  -Stable, no suicidal or homicidal ideations present. -continue prn Xanax  Morbid obesity -with suspected OHS and OSA -will need sleep study as an outpatient -encourage to follow aggressive weight loss, increase physical activity and follow dieting  -Body mass index is 50.35 kg/m.   Nausea/vomiting -most likely due to GERD -will continue PPI to BID -PRN antiemetics and reglan for refractory symptoms   HA/Blurred vision/AMS -will check CT scan of her head -continue prn pain meds; sounds to be secondary to migraine.  Code Status: Full Family Communication: no family at bedside Disposition Plan: remains inpatient, will continue lasix, but transition to PO today, continue to adjust antihypertensive agents, follow cardiology rec's. For AMS/severe HA and blurred vision will check CT scan of his head.   Consultants:  Cardiology   Procedures:  2-D echo - Left ventricle: The cavity size was mildly dilated. Wall   thickness was increased in a pattern of moderate LVH. Systolic   function was mildly reduced. The estimated ejection fraction was   in the range of 45% to 50%. Diffuse hypokinesis. Features are   consistent with a pseudonormal left ventricular filling pattern,   with concomitant abnormal relaxation and increased filling   pressure (grade 2 diastolic dysfunction). Doppler parameters are   consistent with high ventricular filling pressure. - Aortic valve: Transvalvular velocity was within the normal range.   There was no stenosis. There was no regurgitation. - Mitral valve: Transvalvular velocity was within the normal range.   There was no evidence for stenosis. There was trivial   regurgitation. - Right ventricle: The cavity size was normal. Wall thickness was   normal. Systolic function was normal. -  Atrial septum:  No defect or patent foramen ovale was identified. - Tricuspid valve: There was mild regurgitation. - Pulmonary arteries: Systolic pressure was severely increased. PA   peak pressure: 59 mm Hg (S). - Pericardium, extracardiac: A small pericardial effusion was   identified circumferential to the heart.   Antibiotics:  None   HPI/Subjective: Afebrile, reports intermittent nausea and major HA with blurred vision and AMS. No CP, no vomiting.  Objective: Vitals:   02/24/17 1454 02/24/17 1530  BP: (!) 190/118 (!) 187/94  Pulse:    Resp:    Temp:      Intake/Output Summary (Last 24 hours) at 02/24/17 1640 Last data filed at 02/24/17 0400  Gross per 24 hour  Intake              120 ml  Output             2000 ml  Net            -1880 ml   Filed Weights   02/22/17 1158 02/23/17 0500 02/24/17 0400  Weight: (!) 150 kg (330 lb 11 oz) (!) 149.8 kg (330 lb 4 oz) (!) 150.2 kg (331 lb 2.1 oz)    Exam:   General:  Afebrile, with no CP and no vomiting; overall feeling better. But ended experiencing severe HA, with associated AMS (as per family reports and associated vision changes). Feeling nauseated.  Cardiovascular: S1 and S2, no rubs, no gallops, no JVD  Respiratory: no wheezing, no frank crackles; decrease BS at bases  Abdomen: obese, soft, NT, positive BS  Musculoskeletal: 1-2++ edema bilaterally, no cyanosis  Data Reviewed: Basic Metabolic Panel:  Recent Labs Lab 02/21/17 1510 02/22/17 0440 02/23/17 0541 02/24/17 0510  NA 138 141 139 139  K 3.8 4.0 3.5 3.6  CL 103 103 98* 98*  CO2 30 33* 34* 34*  GLUCOSE 99 112* 97 92  BUN 18 17 20 20   CREATININE 1.10 1.10* 1.12* 1.16*  CALCIUM 9.2 8.5* 8.9 8.5*   Liver Function Tests:  Recent Labs Lab 02/21/17 1510  AST 13  ALT 12  ALKPHOS 70  BILITOT 0.6  PROT 6.5  ALBUMIN 3.6   CBC:  Recent Labs Lab 02/21/17 1510  WBC 7.9  NEUTROABS 5.7  HGB 11.9*  HCT 39.6  MCV 79.8  PLT 214.0   Cardiac  Enzymes:  Recent Labs Lab 02/21/17 2046 02/22/17 0221 02/22/17 0904  TROPONINI 0.04* 0.04* 0.05*    Recent Labs  02/21/17 1510  PROBNP 1,145.0*    Studies: Dg Chest 2 View  Result Date: 02/24/2017 CLINICAL DATA:  Pulmonary edema EXAM: CHEST  2 VIEW COMPARISON:  02/21/2017 chest radiograph. FINDINGS: Stable cardiomediastinal silhouette with moderate enlargement of the cardiopericardial silhouette. No pneumothorax. No pleural effusion. Mild pulmonary edema is not appreciably changed. IMPRESSION: 1. Stable moderate enlargement of the cardiopericardial silhouette, cannot exclude pericardial effusion . 2. Stable mild pulmonary edema. Electronically Signed   By: Delbert Phenix M.D.   On: 02/24/2017 08:40   Ct Head Wo Contrast  Result Date: 02/24/2017 CLINICAL DATA:  41 year old female with acute headache and altered mental status. EXAM: CT HEAD WITHOUT CONTRAST TECHNIQUE: Contiguous axial images were obtained from the base of the skull through the vertex without intravenous contrast. COMPARISON:  None. FINDINGS: Brain: No intracranial abnormalities are identified. There is no evidence of infarction, hemorrhage, midline shift, hydrocephalus, mass or mass effect or extra-axial collection. Vascular: No hyperdense vessel or unexpected calcification. Skull: Normal. Negative for fracture or focal  lesion. Sinuses/Orbits: Mucosal thickening with possible fluid in the maxillary and sphenoid sinuses noted. Opacified anterior right ethmoid air cell noted. The mastoid air cells are clear. Other: None IMPRESSION: No evidence of intracranial abnormality. Paranasal sinus disease/sinusitis. Electronically Signed   By: Harmon Pier M.D.   On: 02/24/2017 16:08    Scheduled Meds: . amLODipine  5 mg Oral Daily  . aspirin  325 mg Oral Daily  . carvedilol  12.5 mg Oral BID WC  . docusate sodium  100 mg Oral BID  . enoxaparin (LOVENOX) injection  40 mg Subcutaneous Q24H  . furosemide  40 mg Oral Daily  . lisinopril   20 mg Oral Daily  . nicotine  21 mg Transdermal Daily  . pantoprazole  40 mg Oral BID  . polyethylene glycol  17 g Oral Daily  . sodium chloride flush  3 mL Intravenous Q12H   Continuous Infusions: . nitroGLYCERIN Stopped (02/22/17 0432)    Principal Problem:   Hypertensive emergency Active Problems:   Acute systolic congestive heart failure (HCC)   Tobacco abuse   Chest pain   Anxiety and depression   Malignant hypertensive urgency   Time spent: 25 minutes   Vassie Loll  [facility] Pager [phone]. If 7PM-7AM, please contact night-coverage at [email or web address], password [facility] 02/24/2017, 4:40 PM  LOS: 3 days

## 2017-02-24 NOTE — Progress Notes (Addendum)
Patient c/o 8/10 sharp "headache" behind the eyes. She c/o blurry vision, seeing "rainbow zigzags" and chills. Temperature 97.8, BP 145/90, HR - 61. Patient is in bed with eyes closed, room darkened, in no acute signs of distress. MD notified.  Earnest Conroy. Clelia Croft, RN

## 2017-02-24 NOTE — Progress Notes (Signed)
DAILY PROGRESS NOTE  Subjective: Breathing is better this am - CXR show persistent mild pulmonary edema, but lungs clear. Diuresed about 3L negative overnight. Creatinine with a slight trend upward to 1.16 (from 1.12). BP controlled.  Objective:  Temp:  [98.1 F (36.7 C)-98.6 F (37 C)] 98.6 F (37 C) (03/11 0400) Pulse Rate:  [60-70] 70 (03/11 0400) Resp:  [18-20] 18 (03/11 0400) BP: (119-184)/(58-94) 138/71 (03/11 0400) SpO2:  [92 %-94 %] 92 % (03/11 0400) Weight:  [331 lb 2.1 oz (150.2 kg)] 331 lb 2.1 oz (150.2 kg) (03/11 0400) Weight change: 7.1 oz (0.2 kg)  Intake/Output from previous day: 03/10 0701 - 03/11 0700 In: 360 [P.O.:360] Out: 3300 [Urine:3000; Emesis/NG output:300]  Intake/Output from this shift: No intake/output data recorded.  Medications: No current facility-administered medications on file prior to encounter.    Current Outpatient Prescriptions on File Prior to Encounter  Medication Sig Dispense Refill  . lisinopril (PRINIVIL,ZESTRIL) 10 MG tablet Take 1 tablet (10 mg total) by mouth daily. 30 tablet 3  . metoprolol succinate (TOPROL-XL) 25 MG 24 hr tablet Take 1 tablet (25 mg total) by mouth daily. 30 tablet 3  . potassium chloride SA (K-DUR,KLOR-CON) 20 MEQ tablet Take 1 tablet (20 mEq total) by mouth daily. 30 tablet 3    Physical Exam: General appearance: alert and no distress Neck: no carotid bruit and no JVD Lungs: clear to auscultation bilaterally Heart: regular rate and rhythm, S1, S2 normal, no murmur, click, rub or gallop Abdomen: soft, non-tender; bowel sounds normal; no masses,  no organomegaly and obese Extremities: extremities normal, atraumatic, no cyanosis or edema Pulses: 2+ and symmetric Skin: Skin color, texture, turgor normal. No rashes or lesions Neurologic: Grossly normal Psych: Pleasant  Lab Results: Results for orders placed or performed during the hospital encounter of 02/21/17 (from the past 48 hour(s))  Troponin I  (q 6hr x 3)     Status: Abnormal   Collection Time: 02/22/17  9:04 AM  Result Value Ref Range   Troponin I 0.05 (HH) <0.03 ng/mL    Comment: CRITICAL VALUE NOTED.  VALUE IS CONSISTENT WITH PREVIOUSLY REPORTED AND CALLED VALUE.  Glucose, capillary     Status: Abnormal   Collection Time: 02/22/17 10:22 PM  Result Value Ref Range   Glucose-Capillary 111 (H) 65 - 99 mg/dL  Basic metabolic panel     Status: Abnormal   Collection Time: 02/23/17  5:41 AM  Result Value Ref Range   Sodium 139 135 - 145 mmol/L   Potassium 3.5 3.5 - 5.1 mmol/L   Chloride 98 (L) 101 - 111 mmol/L   CO2 34 (H) 22 - 32 mmol/L   Glucose, Bld 97 65 - 99 mg/dL   BUN 20 6 - 20 mg/dL   Creatinine, Ser 1.12 (H) 0.44 - 1.00 mg/dL   Calcium 8.9 8.9 - 10.3 mg/dL   GFR calc non Af Amer >60 >60 mL/min   GFR calc Af Amer >60 >60 mL/min    Comment: (NOTE) The eGFR has been calculated using the CKD EPI equation. This calculation has not been validated in all clinical situations. eGFR's persistently <60 mL/min signify possible Chronic Kidney Disease.    Anion gap 7 5 - 15  Basic metabolic panel     Status: Abnormal   Collection Time: 02/24/17  5:10 AM  Result Value Ref Range   Sodium 139 135 - 145 mmol/L   Potassium 3.6 3.5 - 5.1 mmol/L   Chloride 98 (L) 101 -  111 mmol/L   CO2 34 (H) 22 - 32 mmol/L   Glucose, Bld 92 65 - 99 mg/dL   BUN 20 6 - 20 mg/dL   Creatinine, Ser 1.16 (H) 0.44 - 1.00 mg/dL   Calcium 8.5 (L) 8.9 - 10.3 mg/dL   GFR calc non Af Amer 59 (L) >60 mL/min   GFR calc Af Amer >60 >60 mL/min    Comment: (NOTE) The eGFR has been calculated using the CKD EPI equation. This calculation has not been validated in all clinical situations. eGFR's persistently <60 mL/min signify possible Chronic Kidney Disease.    Anion gap 7 5 - 15    Imaging: No results found.  Assessment:  Principal Problem:   Hypertensive emergency Active Problems:   Acute systolic congestive heart failure (HCC)   Tobacco  abuse   Chest pain   Anxiety and depression   Malignant hypertensive urgency   Plan:  Lungs clear on exam - plan to try to wean off oxygen. Diuresed adequately with small rise in creatinine.  Switch lasix to 40 mg po daily today. BP controlled - on appropriate CHF meds. Plan for outpatient ischemia work-up. Probably okay for d/c today from a cardiac standpoint.  Time Spent Directly with Patient:  15 minutes  Length of Stay:  LOS: 3 days   Pixie Casino, MD, [facility] Attending Cardiologist [place] 02/24/2017, 8:30 AM

## 2017-02-25 ENCOUNTER — Encounter (HOSPITAL_COMMUNITY): Payer: Self-pay | Admitting: Cardiology

## 2017-02-25 DIAGNOSIS — R0602 Shortness of breath: Secondary | ICD-10-CM

## 2017-02-25 DIAGNOSIS — I5041 Acute combined systolic (congestive) and diastolic (congestive) heart failure: Secondary | ICD-10-CM

## 2017-02-25 DIAGNOSIS — G43909 Migraine, unspecified, not intractable, without status migrainosus: Secondary | ICD-10-CM

## 2017-02-25 LAB — BASIC METABOLIC PANEL
ANION GAP: 6 (ref 5–15)
BUN: 17 mg/dL (ref 6–20)
CALCIUM: 8.4 mg/dL — AB (ref 8.9–10.3)
CO2: 37 mmol/L — AB (ref 22–32)
Chloride: 97 mmol/L — ABNORMAL LOW (ref 101–111)
Creatinine, Ser: 1.03 mg/dL — ABNORMAL HIGH (ref 0.44–1.00)
GFR calc non Af Amer: >60 mL/min (ref >60)
GLUCOSE: 99 mg/dL (ref 65–99)
POTASSIUM: 3.6 mmol/L (ref 3.5–5.1)
Sodium: 140 mmol/L (ref 135–145)

## 2017-02-25 LAB — CBC
HCT: 37.8 % (ref 36.0–46.0)
HEMOGLOBIN: 10.6 g/dL — AB (ref 12.0–15.0)
MCH: 23.9 pg — AB (ref 26.0–34.0)
MCHC: 28 g/dL — AB (ref 30.0–36.0)
MCV: 85.1 fL (ref 78.0–100.0)
Platelets: 217 10*3/uL (ref 150–400)
RBC: 4.44 MIL/uL (ref 3.87–5.11)
RDW: 16.6 % — ABNORMAL HIGH (ref 11.5–15.5)
WBC: 8.1 10*3/uL (ref 4.0–10.5)

## 2017-02-25 MED ORDER — ASPIRIN 81 MG PO TABS: 81.0000 mg | ORAL_TABLET | Freq: Every day | ORAL | 1 refills | Status: DC

## 2017-02-25 MED ORDER — PANTOPRAZOLE SODIUM 40 MG PO TBEC: 40.0000 mg | DELAYED_RELEASE_TABLET | Freq: Two times a day (BID) | ORAL | 1 refills | Status: DC

## 2017-02-25 MED ORDER — FUROSEMIDE 40 MG PO TABS: 40.0000 mg | ORAL_TABLET | Freq: Every day | ORAL | 1 refills | Status: DC

## 2017-02-25 MED ORDER — CARVEDILOL 12.5 MG PO TABS: 12.5000 mg | ORAL_TABLET | Freq: Two times a day (BID) | ORAL | 1 refills | Status: DC

## 2017-02-25 MED ORDER — AMLODIPINE BESYLATE 5 MG PO TABS: 5.0000 mg | ORAL_TABLET | Freq: Every day | ORAL | 1 refills | Status: DC

## 2017-02-25 MED ORDER — LISINOPRIL 20 MG PO TABS: 20.0000 mg | ORAL_TABLET | Freq: Every day | ORAL | 1 refills | Status: DC

## 2017-02-25 NOTE — Discharge Summary (Signed)
Physician Discharge Summary  Amber Bentley GNF:[id number] DOB: 09/06/1976 DOA: 02/21/2017  PCP: Alysia Penna, NP  Admit date: 02/21/2017 Discharge date: 02/25/2017  Time spent: 35 minutes  Recommendations for Outpatient Follow-up:  1. Repeat BMET to follow electrolytes and renal function  2. Follow patient volume status and BP; adjust diuretics and antihypertensive regimen as needed  3. Please arrange for sleep study as an outpatient  4. Reassess oxygen needs at follow up   Discharge Diagnoses:  Principal Problem:   Hypertensive emergency Active Problems:   Acute combined systolic and diastolic congestive heart failure (HCC)   Tobacco abuse   Chest pain   Anxiety and depression   Malignant hypertensive urgency   Altered mental state   Hypertensive cardiomyopathy, 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HCC)   Congestive heart failure (HCC)   Pulmonary edema   Obesity, morbid (HCC)   SOB (shortness of breath)   Migraine without status migrainosus, not intractable   Discharge Condition: stable and improved. discharge home with instructions to follow up with PCP and cardiology service as an outpatient.  Diet recommendation: low calorie and heart healthy/low sodium diet   Filed Weights   02/23/17 0500 02/24/17 0400 02/25/17 0536  Weight: (!) 149.8 kg (330 lb 4 oz) (!) 150.2 kg (331 lb 2.1 oz) (!) 148.5 kg (327 lb 6.1 oz)    History of present illness:  41 y.o.femalewith medical history significant of hypertension, tobacco abuse, depression, anxiety, medication noncompliance, who presents with bilateral leg edema, shortness breath and chest pain. Patient states that she has been having intermittent chest pain and worsening bilateral leg edema in the past 8 weeks. Her chest pain is located in the substernal area, intermittent, happens 3-4 times per week, each time lasted for a few minutes to a few hours. It is sharp, nonradiating. It is aggravated by exertion, not by deep breath. No recent long  distant traveling.  Found to have interstitial pulmonary edema and emergency HTN crisis.  Hospital Course:  Hypertensive emergency: Patient has significantly elevated blood pressure, and acute congestive heart failure. -has been hypertensive for many years and never took medication -started on amlodipine, lisinopril, coreg and lasix during this admission  -initially on NTG drip; now with stable BP overall and off drip -advise to follow low sodium diet and to be compliant with medication regimen   Hypertensive cardiomyopathy with combined CHF/interstitial pulmonary edema (HCC): -Low sodium diet and daily weight -will cont control BP -on lisinopril and coreg -continue lasix daily -also on amlodipine   SOB hypoxia: -due to interstitial edema, CHF and most likely component of OSA/OHS -ended requiring O2 supplementation at discharge -will recommend reassessment for oxygen needs; and will need outpatient sleep study  Chest pain: likely due to demand ischemia 2/2 of CHF and elevate Bp.  -TSH 4.78, A1c 5.8 -on EKG: diffuse TWI and sinus rhythm; no ST abnormalities. -NTG drip discontinued; patient BP better and no having continue CP now. -prn Morphine, and aspirin to be continued. -2D echo with diffuse hypokinesis, EF 45-50% and grade 2 diastolic dysfunction. (Demonstrating hypertensive cardiomyopathy). -cardiology looking to do further work up as an outpatient (initially thinking about coronary CTA (but due to her obesity, might not be possible); most likely 2 days Myoview.  Tobacco abuse: -cessation counseling provided -patient looking to quit.  Hx of Anxiety and depression: not taking meds for this at home. -Stable, no suicidal or homicidal ideations present. -follow with PCP at discharge and if needed might benefit of outpatient psychiatry service referral  Morbid  obesity -with high suspicious for OHS and OSA -will need sleep study as an outpatient -encourage to follow  aggressive weight loss regimen (increasing physical activity and following low calorie diet)  -Body mass index is 50.35 kg/m.   Nausea/vomiting -most likely due to GERD -will continue PPI to BID -Patient felt better from reflux symptoms and symptoms prior to discharge  HA/Blurred vision/AMS -neg CT scan of her head -continue prn pain meds; sounds to be secondary to migraine -might need outpatient follow up with neurology and/or initiation of preventive drugs fro migraine (topamax) if symptoms persist    Procedures:  2-D echo - Left ventricle: The cavity size was mildly dilated. Wall thickness was increased in a pattern of moderate LVH. Systolic function was mildly reduced. The estimated ejection fraction was in the range of 45% to 50%. Diffuse hypokinesis. Features are consistent with a pseudonormal left ventricular filling pattern, with concomitant abnormal relaxation and increased filling pressure (grade 2 diastolic dysfunction). Doppler parameters are consistent with high ventricular filling pressure. - Aortic valve: Transvalvular velocity was within the normal range. There was no stenosis. There was no regurgitation. - Mitral valve: Transvalvular velocity was within the normal range. There was no evidence for stenosis. There was trivial regurgitation. - Right ventricle: The cavity size was normal. Wall thickness was normal. Systolic function was normal. - Atrial septum: No defect or patent foramen ovale was identified. - Tricuspid valve: There was mild regurgitation. - Pulmonary arteries: Systolic pressure was severely increased. PA peak pressure: 59 mm Hg (S). - Pericardium, extracardiac: A small pericardial effusion was identified circumferential to the heart.  Consultations: Cardiology  Discharge Exam: Vitals:   02/24/17 2055 02/25/17 0536  BP: (!) 141/60 123/79  Pulse: 67 76  Resp: 18 18  Temp: 97.9 F (36.6 C) 98.7 F (37.1 C)     General:  Afebrile, with no CP and no vomiting; overall feeling much better. deneis HA today. AAOX3, no nausea, no vomiting.  Cardiovascular: S1 and S2, no rubs, no gallops, no JVD (even difficult assessment given body habitus)  Respiratory: no wheezing, no frank crackles; decrease BS at bases  Abdomen: obese, soft, NT, positive BS  Musculoskeletal: 1++ edema bilaterally, no cyanosis  Discharge Instructions   Discharge Instructions    (HEART FAILURE PATIENTS) Call MD:  Anytime you have any of the following symptoms: 1) 3 pound weight gain in 24 hours or 5 pounds in 1 week 2) shortness of breath, with or without a dry hacking cough 3) swelling in the hands, feet or stomach 4) if you have to sleep on extra pillows at night in order to breathe.    Complete by:  As directed    Discharge instructions    Complete by:  As directed    Follow low sodium/heart healthy diet Keep yourself well hydrated Arranged follow up with PCP in 10 days  Follow up with cardiology service as instructed  Follow low calorie diet and increase physical activity to assist losing weight Stop smoking   Increase activity slowly    Complete by:  As directed      Current Discharge Medication List    START taking these medications   Details  amLODipine (NORVASC) 5 MG tablet Take 1 tablet (5 mg total) by mouth daily. Qty: 30 tablet, Refills: 1    aspirin 81 MG tablet Take 1 tablet (81 mg total) by mouth daily. Qty: 30 tablet, Refills: 1    carvedilol (COREG) 12.5 MG tablet Take 1 tablet (12.5 mg  total) by mouth 2 (two) times daily with a meal. Qty: 60 tablet, Refills: 1    furosemide (LASIX) 40 MG tablet Take 1 tablet (40 mg total) by mouth daily. Qty: 30 tablet, Refills: 1    pantoprazole (PROTONIX) 40 MG tablet Take 1 tablet (40 mg total) by mouth 2 (two) times daily. Qty: 60 tablet, Refills: 1      CONTINUE these medications which have CHANGED   Details  lisinopril (PRINIVIL,ZESTRIL) 20 MG  tablet Take 1 tablet (20 mg total) by mouth daily. Qty: 30 tablet, Refills: 1   Associated Diagnoses: HTN (hypertension), benign      CONTINUE these medications which have NOT CHANGED   Details  ibuprofen (ADVIL,MOTRIN) 200 MG tablet Take 1,000 mg by mouth every 6 (six) hours as needed for headache, mild pain or moderate pain.    potassium chloride SA (K-DUR,KLOR-CON) 20 MEQ tablet Take 1 tablet (20 mEq total) by mouth daily. Qty: 30 tablet, Refills: 3   Associated Diagnoses: HTN (hypertension), benign      STOP taking these medications     metoprolol succinate (TOPROL-XL) 25 MG 24 hr tablet        Allergies  Allergen Reactions  . 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   [name] [Naproxen] Other (See Comments)    Pt states that this medication makes her loopy.    Follow-up Information    Chrystie Nose, MD Follow up.   Specialty:  Cardiology Why:  office will contact you Contact information: [address] [place] [address] [place] [address] [phone]        Alysia Penna, NP. Schedule an appointment as soon as possible for a visit in 10 day(s).   Specialty:  Internal Medicine Contact information: [address] [place] [address] [phone]            The results of significant diagnostics from this hospitalization (including imaging, microbiology, ancillary and laboratory) are listed below for reference.    Significant Diagnostic Studies: Dg Chest 2 View  Result Date: 02/24/2017 CLINICAL DATA:  Pulmonary edema EXAM: CHEST  2 VIEW COMPARISON:  02/21/2017 chest radiograph. FINDINGS: Stable cardiomediastinal silhouette with moderate enlargement of the cardiopericardial silhouette. No pneumothorax. No pleural effusion. Mild pulmonary edema is not appreciably changed. IMPRESSION: 1. Stable moderate enlargement of the cardiopericardial silhouette, cannot exclude pericardial effusion . 2. Stable mild pulmonary edema.  Electronically Signed   By: Delbert Phenix M.D.   On: 02/24/2017 08:40   Dg Chest 2 View  Result Date: 02/21/2017 CLINICAL DATA:  41 y/o female with cough fatigue congestion and fluid retention for 1 month. Initial encounter. Smoker. EXAM: CHEST  2 VIEW COMPARISON:  None. FINDINGS: Large body habitus. Moderate to severe cardiomegaly. No pneumothorax, pleural effusion or consolidation, but there is peripheral linear increased mostly interstitial appearing opacity in both lungs, more apparent on the left. No acute osseous abnormality identified. Negative visible bowel gas pattern. IMPRESSION: 1. Mild pulmonary interstitial edema suspected. No pleural effusion. 2. Moderate to severe cardiomegaly.  Consider pericardial effusion. Electronically Signed   By: Odessa Fleming M.D.   On: 02/21/2017 15:35   Ct Head Wo Contrast  Result Date: 02/24/2017 CLINICAL DATA:  41 year old female with acute headache and altered mental status. EXAM: CT HEAD WITHOUT CONTRAST TECHNIQUE: Contiguous axial images were obtained from the base of the skull through the vertex without intravenous contrast. COMPARISON:  None. FINDINGS: Brain: No intracranial abnormalities are identified. There  is no evidence of infarction, hemorrhage, midline shift, hydrocephalus, mass or mass effect or extra-axial collection. Vascular: No hyperdense vessel or unexpected calcification. Skull: Normal. Negative for fracture or focal lesion. Sinuses/Orbits: Mucosal thickening with possible fluid in the maxillary and sphenoid sinuses noted. Opacified anterior right ethmoid air cell noted. The mastoid air cells are clear. Other: None IMPRESSION: No evidence of intracranial abnormality. Paranasal sinus disease/sinusitis. Electronically Signed   By: Harmon Pier M.D.   On: 02/24/2017 16:08    Microbiology: No results found for this or any previous visit (from the past 240 hour(s)).   Labs: Basic Metabolic Panel:  Recent Labs Lab 02/21/17 1510 02/22/17 0440  02/23/17 0541 02/24/17 0510 02/25/17 0503  NA 138 141 139 139 140  K 3.8 4.0 3.5 3.6 3.6  CL 103 103 98* 98* 97*  CO2 30 33* 34* 34* 37*  GLUCOSE 99 112* 97 92 99  BUN 18 17 20 20 17   CREATININE 1.10 1.10* 1.12* 1.16* 1.03*  CALCIUM 9.2 8.5* 8.9 8.5* 8.4*   Liver Function Tests:  Recent Labs Lab 02/21/17 1510  AST 13  ALT 12  ALKPHOS 70  BILITOT 0.6  PROT 6.5  ALBUMIN 3.6   No results for input(s): LIPASE, AMYLASE in the last 168 hours. No results for input(s): AMMONIA in the last 168 hours. CBC:  Recent Labs Lab 02/21/17 1510 02/25/17 0503  WBC 7.9 8.1  NEUTROABS 5.7  --   HGB 11.9* 10.6*  HCT 39.6 37.8  MCV 79.8 85.1  PLT 214.0 217   Cardiac Enzymes:  Recent Labs Lab 02/21/17 2046 02/22/17 0221 02/22/17 0904  TROPONINI 0.04* 0.04* 0.05*   BNP: BNP (last 3 results) No results for input(s): BNP in the last 8760 hours.  ProBNP (last 3 results)  Recent Labs  02/21/17 1510  PROBNP 1,145.0*    CBG:  Recent Labs Lab 02/22/17 2222 02/24/17 1254  GLUCAP 111* 94       Signed:  Vassie Loll MD.  [facility] 02/25/2017, 11:53 AM

## 2017-02-25 NOTE — Progress Notes (Signed)
SATURATION QUALIFICATIONS: (This note is used to comply with regulatory documentation for home oxygen)  Patient Saturations on Room Air at Rest = 92%  Patient Saturations on Room Air while Ambulating = 84%  Patient Saturations on 2 Liters of oxygen while Ambulating = 98%  Please briefly explain why patient needs home oxygen: Patient's oxygen saturation drops to 84 % while ambulating on RA.   Earnest Conroy. Clelia Croft, RN

## 2017-02-25 NOTE — Progress Notes (Signed)
Progress Note  Patient Name: Amber Bentley Date of Encounter: 02/25/2017  Primary Cardiologist: Dr Rennis Golden (pt request)  Subjective   Headache last night delayed discharge- "migraine"  Inpatient Medications    Scheduled Meds: . amLODipine  5 mg Oral Daily  . aspirin  325 mg Oral Daily  . carvedilol  12.5 mg Oral BID WC  . docusate sodium  100 mg Oral BID  . enoxaparin (LOVENOX) injection  40 mg Subcutaneous Q24H  . furosemide  40 mg Oral Daily  . lisinopril  20 mg Oral Daily  . nicotine  21 mg Transdermal Daily  . pantoprazole  40 mg Oral BID  . polyethylene glycol  17 g Oral Daily  . sodium chloride flush  3 mL Intravenous Q12H   Continuous Infusions:  PRN Meds: sodium chloride, acetaminophen, ALPRAZolam, hydrALAZINE, metoCLOPramide (REGLAN) injection, nitroGLYCERIN, ondansetron (ZOFRAN) IV, sodium chloride flush   Vital Signs    Vitals:   02/24/17 1530 02/24/17 1900 02/24/17 2055 02/25/17 0536  BP: (!) 187/94 (!) 141/67 (!) 141/60 123/79  Pulse:   67 76  Resp:   18 18  Temp:   97.9 F (36.6 C) 98.7 F (37.1 C)  TempSrc:   Oral Oral  SpO2:   99% 93%  Weight:    (!) 327 lb 6.1 oz (148.5 kg)  Height:        Intake/Output Summary (Last 24 hours) at 02/25/17 0801 Last data filed at 02/24/17 1817  Gross per 24 hour  Intake              240 ml  Output                0 ml  Net              240 ml   Filed Weights   02/23/17 0500 02/24/17 0400 02/25/17 0536  Weight: (!) 330 lb 4 oz (149.8 kg) (!) 331 lb 2.1 oz (150.2 kg) (!) 327 lb 6.1 oz (148.5 kg)    Telemetry    NSR - Personally Reviewed  ECG    NSR, Lat TWI- Personally Reviewed  Physical Exam   GEN: No acute distress. Morbidly obese Neck: No JVD Cardiac: RRR, no murmurs, rubs, or gallops.  Respiratory: Clear to auscultation bilaterally. MS: No edema; No deformity. Neuro:  Nonfocal  Psych: Normal affect   Labs    Chemistry Recent Labs Lab 02/21/17 1510  02/23/17 0541 02/24/17 0510  02/25/17 0503  NA 138  < > 139 139 140  K 3.8  < > 3.5 3.6 3.6  CL 103  < > 98* 98* 97*  CO2 30  < > 34* 34* 37*  GLUCOSE 99  < > 97 92 99  BUN 18  < > 20 20 17   CREATININE 1.10  < > 1.12* 1.16* 1.03*  CALCIUM 9.2  < > 8.9 8.5* 8.4*  PROT 6.5  --   --   --   --   ALBUMIN 3.6  --   --   --   --   AST 13  --   --   --   --   ALT 12  --   --   --   --   ALKPHOS 70  --   --   --   --   BILITOT 0.6  --   --   --   --   GFRNONAA  --   < > >60 59* >60  GFRAA  --   < > >  60 >60 >60  ANIONGAP  --   < > 7 7 6   < > = values in this interval not displayed.   Hematology Recent Labs Lab 02/21/17 1510 02/25/17 0503  WBC 7.9 8.1  RBC 4.97 4.44  HGB 11.9* 10.6*  HCT 39.6 37.8  MCV 79.8 85.1  MCH  --  23.9*  MCHC 30.1 28.0*  RDW 17.2* 16.6*  PLT 214.0 217    Cardiac Enzymes Recent Labs Lab 02/21/17 2046 02/22/17 0221 02/22/17 0904  TROPONINI 0.04* 0.04* 0.05*    Recent Labs Lab 02/21/17 1944  TROPIPOC 0.03     BNP Recent Labs Lab 02/21/17 1510  PROBNP 1,145.0*     DDimer No results for input(s): DDIMER in the last 168 hours.   Radiology    Dg Chest 2 View  Result Date: 02/24/2017 CLINICAL DATA:  Pulmonary edema EXAM: CHEST  2 VIEW COMPARISON:  02/21/2017 chest radiograph. FINDINGS: Stable cardiomediastinal silhouette with moderate enlargement of the cardiopericardial silhouette. No pneumothorax. No pleural effusion. Mild pulmonary edema is not appreciably changed. IMPRESSION: 1. Stable moderate enlargement of the cardiopericardial silhouette, cannot exclude pericardial effusion . 2. Stable mild pulmonary edema. Electronically Signed   By: Delbert Phenix M.D.   On: 02/24/2017 08:40   Ct Head Wo Contrast  Result Date: 02/24/2017 CLINICAL DATA:  41 year old female with acute headache and altered mental status. EXAM: CT HEAD WITHOUT CONTRAST TECHNIQUE: Contiguous axial images were obtained from the base of the skull through the vertex without intravenous contrast.  COMPARISON:  None. FINDINGS: Brain: No intracranial abnormalities are identified. There is no evidence of infarction, hemorrhage, midline shift, hydrocephalus, mass or mass effect or extra-axial collection. Vascular: No hyperdense vessel or unexpected calcification. Skull: Normal. Negative for fracture or focal lesion. Sinuses/Orbits: Mucosal thickening with possible fluid in the maxillary and sphenoid sinuses noted. Opacified anterior right ethmoid air cell noted. The mastoid air cells are clear. Other: None IMPRESSION: No evidence of intracranial abnormality. Paranasal sinus disease/sinusitis. Electronically Signed   By: Harmon Pier M.D.   On: 02/24/2017 16:08    Cardiac Studies   Echo 02/22/17-  Study Conclusions  - Left ventricle: The cavity size was mildly dilated. Wall   thickness was increased in a pattern of moderate LVH. Systolic   function was mildly reduced. The estimated ejection fraction was   in the range of 45% to 50%. Diffuse hypokinesis. Features are   consistent with a pseudonormal left ventricular filling pattern,   with concomitant abnormal relaxation and increased filling   pressure (grade 2 diastolic dysfunction). Doppler parameters are   consistent with high ventricular filling pressure. - Aortic valve: Transvalvular velocity was within the normal range.   There was no stenosis. There was no regurgitation. - Mitral valve: Transvalvular velocity was within the normal range.   There was no evidence for stenosis. There was trivial   regurgitation. - Right ventricle: The cavity size was normal. Wall thickness was   normal. Systolic function was normal. - Atrial septum: No defect or patent foramen ovale was identified. - Tricuspid valve: There was mild regurgitation. - Pulmonary arteries: Systolic pressure was severely increased. PA   peak pressure: 59 mm Hg (S). - Pericardium, extracardiac: A small pericardial effusion was   identified circumferential to the  heart.  Patient Profile     41 y.o. female with a history of morbid obesity, HTN, anxiety/depression, > 2PPD smoker, and a strong FMHx of early CAD-admitted 02/21/17 with combined systolic and diastolic CHF in setting  of hypertensive crisis. She has diuresed 4.8L, 3lbs to a DC wgt of 327 Lbs. Echo showed an EF of 45%. Coronary CTA considered but unable to obtain over the weekend, plan is for OP f/u and further work up.   Assessment & Plan    1. Acute combined CHF 2. Hypertensive emergency 3. Morbid obesity 4. Heavy smoker 5. H/O anxiety and depression 6. Abnormal echo- EF 45% 7. FMHx of early CAD  Plan- OK for discharge from cardiology standpoint. I will arrange TOC OV in 7-14 days.   Jolene Provost, PA-C  02/25/2017, 8:01 AM    Patient examined chart reviewed. Obese female lungs clear migraine better for d/c today need to document good sats with Ambulation. Not a good candidate for cardiac CT given obesity and suspect she will need two day lexiscan myovue.  Ok to d/c home PA has arranged outpatient f/u with PA and Dr Rana Snare

## 2017-02-25 NOTE — Progress Notes (Signed)
Home O2 from [facility], O2 sats 84% RA while ambulating.

## 2017-02-25 NOTE — Progress Notes (Signed)
Reviewed discharge information with patient. Answered all questions. Patient able to teach back medications and reasons to contact MD/911. Patient verbalizes importance of PCP follow up appointment.  [place] Kirchner M. Rahmah Mccamy, RN   

## 2017-02-26 DIAGNOSIS — I159 Secondary hypertension, unspecified: Secondary | ICD-10-CM | POA: Diagnosis not present

## 2017-02-26 DIAGNOSIS — I5041 Acute combined systolic (congestive) and diastolic (congestive) heart failure: Secondary | ICD-10-CM | POA: Diagnosis not present

## 2017-02-26 DIAGNOSIS — I5032 Chronic diastolic (congestive) heart failure: Secondary | ICD-10-CM | POA: Diagnosis not present

## 2017-02-28 ENCOUNTER — Encounter: Payer: Self-pay | Admitting: Nurse Practitioner

## 2017-02-28 ENCOUNTER — Ambulatory Visit (INDEPENDENT_AMBULATORY_CARE_PROVIDER_SITE_OTHER): Payer: 59 | Admitting: Nurse Practitioner

## 2017-02-28 VITALS — BP 160/80 | HR 63 | Temp 97.8°F | Ht 68.0 in | Wt 319.0 lb

## 2017-02-28 DIAGNOSIS — I43 Cardiomyopathy in diseases classified elsewhere: Secondary | ICD-10-CM

## 2017-02-28 DIAGNOSIS — I110 Hypertensive heart disease with heart failure: Secondary | ICD-10-CM | POA: Diagnosis not present

## 2017-02-28 DIAGNOSIS — R601 Generalized edema: Secondary | ICD-10-CM

## 2017-02-28 DIAGNOSIS — I10 Essential (primary) hypertension: Secondary | ICD-10-CM

## 2017-02-28 DIAGNOSIS — I509 Heart failure, unspecified: Secondary | ICD-10-CM

## 2017-02-28 NOTE — Progress Notes (Signed)
Subjective:  Patient ID: Amber Bentley, female    DOB: [birth year]  Age: 41 y.o. MRN: [id number]  CC: Follow-up (hospital follow up/sleep study?)   HPI Admission:02/21/17 Discharge: 02/25/17  HTN (chronic) Resolved blurry vision, chest pain and headache. Head CT: negative. Uncontrolled. Does not check BP at home. States she is taking medications as prescribed.(amlodipine and lisinopril)  CHF(new diagnosis): Improved SOB and edema. Weighs self daily. Has made some diet changes to eliminate excess sodium. Current use of lasix and coreg. Has Cardiology appt tomorrow at 11am. approx. 30lbs weight loss since 02/21/17 with use of lasix.  2D echo with diffuse hypokinesis, EF 45-50% and grade 2 diastolic dysfunction. (Demonstrating hypertensive cardiomyopathy).  Possible OSA: Oxygen at bedtime (2L) prescribed by hospitalist. Nocturnal hypoxia noted during hospitalization. sleep study recommended.  Outpatient Medications Prior to Visit  Medication Sig Dispense Refill  . amLODipine (NORVASC) 5 MG tablet Take 1 tablet (5 mg total) by mouth daily. 30 tablet 1  . aspirin 81 MG tablet Take 1 tablet (81 mg total) by mouth daily. 30 tablet 1  . carvedilol (COREG) 12.5 MG tablet Take 1 tablet (12.5 mg total) by mouth 2 (two) times daily with a meal. 60 tablet 1  . furosemide (LASIX) 40 MG tablet Take 1 tablet (40 mg total) by mouth daily. 30 tablet 1  . ibuprofen (ADVIL,MOTRIN) 200 MG tablet Take 1,000 mg by mouth every 6 (six) hours as needed for headache, mild pain or moderate pain.    [name] lisinopril (PRINIVIL,ZESTRIL) 20 MG tablet Take 1 tablet (20 mg total) by mouth daily. 30 tablet 1  . pantoprazole (PROTONIX) 40 MG tablet Take 1 tablet (40 mg total) by mouth 2 (two) times daily. 60 tablet 1  . potassium chloride SA (K-DUR,KLOR-CON) 20 MEQ tablet Take 1 tablet (20 mEq total) by mouth daily. 30 tablet 3   No facility-administered medications prior to visit.     ROS See HPI  Objective:    BP (!) 160/80   Pulse 63   Temp 97.8 F (36.6 C)   Ht 5\' 8"  (1.727 m)   Wt (!) 319 lb (144.7 kg)   SpO2 95%   BMI 48.50 kg/m   BP Readings from Last 3 Encounters:  02/28/17 (!) 160/80  02/25/17 123/79  02/21/17 (!) 170/100    Wt Readings from Last 3 Encounters:  02/28/17 (!) 319 lb (144.7 kg)  02/25/17 (!) 327 lb 6.1 oz (148.5 kg)  02/21/17 (!) 344 lb (156 kg)    Physical Exam  Constitutional: She is oriented to person, place, and time. No distress.  Cardiovascular: Normal rate and normal heart sounds.   Pulmonary/Chest: Effort normal and breath sounds normal. No respiratory distress. She has no rales.  Abdominal: Soft. Bowel sounds are normal. There is no tenderness. There is no rebound.  Musculoskeletal: She exhibits edema.  Neurological: She is alert and oriented to person, place, and time.  Skin: Skin is warm and dry. No erythema.  Vitals reviewed.   Lab Results  Component Value Date   WBC 8.1 02/25/2017   HGB 10.6 (L) 02/25/2017   HCT 37.8 02/25/2017   PLT 217 02/25/2017   GLUCOSE 99 02/25/2017   CHOL 137 02/22/2017   TRIG 54 02/22/2017   HDL 29 (L) 02/22/2017   LDLCALC 97 02/22/2017   ALT 12 02/21/2017   AST 13 02/21/2017   NA 140 02/25/2017   K 3.6 02/25/2017   CL 97 (L) 02/25/2017   CREATININE 1.03 (H) 02/25/2017  BUN 17 02/25/2017   CO2 37 (H) 02/25/2017   TSH 4.78 (H) 02/21/2017   HGBA1C 5.8 02/21/2017    Dg Chest 2 View  Result Date: 02/21/2017 CLINICAL DATA:  41 y/o female with cough fatigue congestion and fluid retention for 1 month. Initial encounter. Smoker. EXAM: CHEST  2 VIEW COMPARISON:  None. FINDINGS: Large body habitus. Moderate to severe cardiomegaly. No pneumothorax, pleural effusion or consolidation, but there is peripheral linear increased mostly interstitial appearing opacity in both lungs, more apparent on the left. No acute osseous abnormality identified. Negative visible bowel gas pattern. IMPRESSION: 1. Mild pulmonary  interstitial edema suspected. No pleural effusion. 2. Moderate to severe cardiomegaly.  Consider pericardial effusion. Electronically Signed   By: Odessa Fleming M.D.   On: 02/21/2017 15:35    Assessment & Plan:   Amber Bentley was seen today for follow-up.  Diagnoses and all orders for this visit:  HTN (hypertension), benign -     Basic metabolic panel; Future -     Split night study; Future  Congestive heart failure, unspecified congestive heart failure chronicity, unspecified congestive heart failure type (HCC) -     Split night study; Future  Hypertensive cardiomyopathy, 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HCC) -     Basic metabolic panel; Future -     Split night study; Future  Generalized edema -     Basic metabolic panel; Future -     Split night study; Future   I am having Amber Bentley maintain her potassium chloride SA, ibuprofen, amLODipine, aspirin, carvedilol, furosemide, lisinopril, and pantoprazole.  No orders of the defined types were placed in this encounter.   Follow-up: Return in about 1 month (around 03/31/2017) for HTN, CHF, and repeat thyroid function and CBC.  Alysia Penna, NP

## 2017-02-28 NOTE — Assessment & Plan Note (Signed)
Repeat BMP in 1week. Cardiology appt 03/01/17

## 2017-02-28 NOTE — Progress Notes (Signed)
Pre visit review using our clinic review tool, if applicable. No additional management support is needed unless otherwise documented below in the visit note. 

## 2017-02-28 NOTE — Patient Instructions (Addendum)
Continue daily weight checks. Call office if weight gain >3Ibs in 1week.  You will be called with appt for sleep study. Return to lab on Thursday for repeat BMP.  Continue current medications as prescribed.  Maintain upcoming appt with cardiologist.  DASH Eating Plan DASH stands for "Dietary Approaches to Stop Hypertension." The DASH eating plan is a healthy eating plan that has been shown to reduce high blood pressure (hypertension). It may also reduce your risk for type 2 diabetes, heart disease, and stroke. The DASH eating plan may also help with weight loss. What are tips for following this plan? General guidelines   Avoid eating more than 2,300 mg (milligrams) of salt (sodium) a day. If you have hypertension, you may need to reduce your sodium intake to 1,500 mg a day.  Limit alcohol intake to no more than 1 drink a day for nonpregnant women and 2 drinks a day for men. One drink equals 12 oz of beer, 5 oz of wine, or 1 oz of hard liquor.  Work with your health care provider to maintain a healthy body weight or to lose weight. Ask what an ideal weight is for you.  Get at least 30 minutes of exercise that causes your heart to beat faster (aerobic exercise) most days of the week. Activities may include walking, swimming, or biking.  Work with your health care provider or diet and nutrition specialist (dietitian) to adjust your eating plan to your individual calorie needs. Reading food labels   Check food labels for the amount of sodium per serving. Choose foods with less than 5 percent of the Daily Value of sodium. Generally, foods with less than 300 mg of sodium per serving fit into this eating plan.  To find whole grains, look for the word "whole" as the first word in the ingredient list. Shopping   Buy products labeled as "low-sodium" or "no salt added."  Buy fresh foods. Avoid canned foods and premade or frozen meals. Cooking   Avoid adding salt when cooking. Use salt-free  seasonings or herbs instead of table salt or sea salt. Check with your health care provider or pharmacist before using salt substitutes.  Do not fry foods. Cook foods using healthy methods such as baking, boiling, grilling, and broiling instead.  Cook with heart-healthy oils, such as olive, canola, soybean, or sunflower oil. Meal planning    Eat a balanced diet that includes:  5 or more servings of fruits and vegetables each day. At each meal, try to fill half of your plate with fruits and vegetables.  Up to 6-8 servings of whole grains each day.  Less than 6 oz of lean meat, poultry, or fish each day. A 3-oz serving of meat is about the same size as a deck of cards. One egg equals 1 oz.  2 servings of low-fat dairy each day.  A serving of nuts, seeds, or beans 5 times each week.  Heart-healthy fats. Healthy fats called Omega-3 fatty acids are found in foods such as flaxseeds and coldwater fish, like sardines, salmon, and mackerel.  Limit how much you eat of the following:  Canned or prepackaged foods.  Food that is high in trans fat, such as fried foods.  Food that is high in saturated fat, such as fatty meat.  Sweets, desserts, sugary drinks, and other foods with added sugar.  Full-fat dairy products.  Do not salt foods before eating.  Try to eat at least 2 vegetarian meals each week.  Eat more  home-cooked food and less restaurant, buffet, and fast food.  When eating at a restaurant, ask that your food be prepared with less salt or no salt, if possible. What foods are recommended? The items listed may not be a complete list. Talk with your dietitian about what dietary choices are best for you. Grains  Whole-grain or whole-wheat bread. Whole-grain or whole-wheat pasta. Brown rice. [place]. Bulgur. Whole-grain and low-sodium cereals. Pita bread. Low-fat, low-sodium crackers. Whole-wheat flour tortillas. Vegetables  Fresh or frozen vegetables (raw, steamed,  roasted, or grilled). Low-sodium or reduced-sodium tomato and vegetable juice. Low-sodium or reduced-sodium tomato sauce and tomato paste. Low-sodium or reduced-sodium canned vegetables. Fruits  All fresh, dried, or frozen fruit. Canned fruit in natural juice (without added sugar). Meat and other protein foods  Skinless chicken or [place]. Ground chicken or [place]. Pork with fat trimmed off. Fish and seafood. Egg whites. Dried beans, peas, or lentils. Unsalted nuts, nut butters, and seeds. Unsalted canned beans. Lean cuts of beef with fat trimmed off. Low-sodium, lean deli meat. Dairy  Low-fat (1%) or fat-free (skim) milk. Fat-free, low-fat, or reduced-fat cheeses. Nonfat, low-sodium ricotta or cottage cheese. Low-fat or nonfat yogurt. Low-fat, low-sodium cheese. Fats and oils  Soft margarine without trans fats. Vegetable oil. Low-fat, reduced-fat, or light mayonnaise and salad dressings (reduced-sodium). Canola, safflower, olive, soybean, and sunflower oils. Avocado. Seasoning and other foods  Herbs. Spices. Seasoning mixes without salt. Unsalted popcorn and pretzels. Fat-free sweets. What foods are not recommended? The items listed may not be a complete list. Talk with your dietitian about what dietary choices are best for you. Grains  Baked goods made with fat, such as croissants, muffins, or some breads. Dry pasta or rice meal packs. Vegetables  Creamed or fried vegetables. Vegetables in a cheese sauce. Regular canned vegetables (not low-sodium or reduced-sodium). Regular canned tomato sauce and paste (not low-sodium or reduced-sodium). Regular tomato and vegetable juice (not low-sodium or reduced-sodium). Angie Fava. Olives. Fruits  Canned fruit in a light or heavy syrup. Fried fruit. Fruit in cream or butter sauce. Meat and other protein foods  Fatty cuts of meat. Ribs. Fried meat. Berniece Salines. Sausage. Bologna and other processed lunch meats. Salami. Fatback. Hotdogs. Bratwurst. Salted nuts and  seeds. Canned beans with added salt. Canned or smoked fish. Whole eggs or egg yolks. Chicken or [place] with skin. Dairy  Whole or 2% milk, cream, and half-and-half. Whole or full-fat cream cheese. Whole-fat or sweetened yogurt. Full-fat cheese. Nondairy creamers. Whipped toppings. Processed cheese and cheese spreads. Fats and oils  Butter. Stick margarine. Lard. Shortening. Ghee. Bacon fat. Tropical oils, such as coconut, palm kernel, or palm oil. Seasoning and other foods  Salted popcorn and pretzels. Onion salt, garlic salt, seasoned salt, table salt, and sea salt. Worcestershire sauce. Tartar sauce. Barbecue sauce. Teriyaki sauce. Soy sauce, including reduced-sodium. Steak sauce. Canned and packaged gravies. Fish sauce. Oyster sauce. Cocktail sauce. Horseradish that you find on the shelf. Ketchup. Mustard. Meat flavorings and tenderizers. Bouillon cubes. Hot sauce and Tabasco sauce. Premade or packaged marinades. Premade or packaged taco seasonings. Relishes. Regular salad dressings. Where to find more information:  National Heart, Lung, and [place]: https://wilson-eaton.com/  American Heart Association: www.heart.org Summary  The DASH eating plan is a healthy eating plan that has been shown to reduce high blood pressure (hypertension). It may also reduce your risk for type 2 diabetes, heart disease, and stroke.  With the DASH eating plan, you should limit salt (sodium) intake to 2,300 mg a day.  If you have hypertension, you may need to reduce your sodium intake to 1,500 mg a day.  When on the DASH eating plan, aim to eat more fresh fruits and vegetables, whole grains, lean proteins, low-fat dairy, and heart-healthy fats.  Work with your health care provider or diet and nutrition specialist (dietitian) to adjust your eating plan to your individual calorie needs. This information is not intended to replace advice given to you by your health care provider. Make sure you discuss any questions  you have with your health care provider. Document Released: 11/22/2011 Document Revised: 11/26/2016 Document Reviewed: 11/26/2016 Elsevier Interactive Patient Education  2017 [employer].

## 2017-03-01 ENCOUNTER — Ambulatory Visit (INDEPENDENT_AMBULATORY_CARE_PROVIDER_SITE_OTHER): Payer: 59 | Admitting: Cardiovascular Disease

## 2017-03-01 ENCOUNTER — Encounter: Payer: Self-pay | Admitting: Cardiovascular Disease

## 2017-03-01 VITALS — BP 180/130 | HR 64 | Ht 68.0 in | Wt 318.8 lb

## 2017-03-01 DIAGNOSIS — I272 Pulmonary hypertension, unspecified: Secondary | ICD-10-CM

## 2017-03-01 DIAGNOSIS — I5032 Chronic diastolic (congestive) heart failure: Secondary | ICD-10-CM | POA: Diagnosis not present

## 2017-03-01 DIAGNOSIS — I5030 Unspecified diastolic (congestive) heart failure: Secondary | ICD-10-CM | POA: Insufficient documentation

## 2017-03-01 MED ORDER — SPIRONOLACTONE 25 MG PO TABS: 25.0000 mg | ORAL_TABLET | Freq: Every day | ORAL | 3 refills | Status: DC

## 2017-03-01 NOTE — Progress Notes (Signed)
Cardiology Office Note   Date:  03/01/2017   ID:  Amber Bentley, DOB May 02, 1976, MRN [id number]  PCP:  Amber Penna, NP  Cardiologist:   Amber Miss, MD   Chief Complaint  Patient presents with  . Hypertension   Problem list 1. Hypertensive emergency 2. Morbid obesity 3. Pulmonary hypertension-moderate 4. Possible sleep apnea   History of Present Illness: Amber Bentley is a 41 y.o. female who presents for follow-up of her recent hospitalization for hypertensive emergency.  Amber Bentley has had high blood pressure for several years but has not been on medications.  I saw her in the [facility] emergency. Room. She has been started on medications. She was in the hospital for 4 days  She seems to be tolerating her medications without any problems.  She had an echocardiogram which revealed mildly depressed left ventricle systolic function with an EF of 45-50%. She has grade 2 diastolic dysfunction.  She has moderate pulmonary hypertension with an estimate PA pressure of 59.  Is avoiding salt. 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  Is a Administrator .  Smokes some .    Past Medical History:  Diagnosis Date  . Anxiety   . Depression     Past Surgical History:  Procedure Laterality Date  . ABDOMINAL HYSTERECTOMY       Current Outpatient Prescriptions  Medication Sig Dispense Refill  . amLODipine (NORVASC) 5 MG tablet Take 1 tablet (5 mg total) by mouth daily. 30 tablet 1  . aspirin 81 MG tablet Take 1 tablet (81 mg total) by mouth daily. 30 tablet 1  . carvedilol (COREG) 12.5 MG tablet Take 1 tablet (12.5 mg total) by mouth 2 (two) times daily with a meal. 60 tablet 1  . furosemide (LASIX) 40 MG tablet Take 1 tablet (40 mg total) by mouth daily. 30 tablet 1  . ibuprofen (ADVIL,MOTRIN) 200 MG tablet Take 1,000 mg by mouth every 6 (six) hours as needed for headache, mild pain or moderate pain.      [name] lisinopril (PRINIVIL,ZESTRIL) 20 MG tablet Take 1 tablet (20 mg total) by mouth daily. 30 tablet 1  . pantoprazole (PROTONIX) 40 MG tablet Take 1 tablet (40 mg total) by mouth 2 (two) times daily. 60 tablet 1  . potassium chloride SA (K-DUR,KLOR-CON) 20 MEQ tablet Take 1 tablet (20 mEq total) by mouth daily. 30 tablet 3   No current facility-administered medications for this visit.     No flowsheet data found.    Allergies:   Food and Aleve [naproxen]    Social History:  The patient  reports that she has been smoking Cigarettes.  She has a 2.50 pack-year smoking history. She has never used smokeless tobacco. She reports that she drinks alcohol. She reports that she does not use drugs.   Family History:  The patient's family history includes Cancer in her maternal aunt, mother, and paternal grandfather; Diabetes in her father; Heart disease (age of onset: 92) in her father; Hyperlipidemia in her father; Hypertension in her mother; Sudden death in her brother; Thyroid disease in her sister.    ROS:  Please see the history of present illness.    Review of Systems: Constitutional:  denies fever, chills, diaphoresis, appetite change and fatigue.  HEENT: denies photophobia, eye pain, redness, hearing loss, ear pain, congestion, sore throat, rhinorrhea, sneezing, neck pain, neck stiffness and tinnitus.  Respiratory: denies  SOB, DOE, cough, chest tightness, and wheezing.  Cardiovascular: denies chest pain, palpitations and leg swelling.  Gastrointestinal: denies nausea, vomiting, abdominal pain, diarrhea, constipation, blood in stool.  Genitourinary: denies dysuria, urgency, frequency, hematuria, flank pain and difficulty urinating.  Musculoskeletal: denies  myalgias, back pain, joint swelling, arthralgias and gait problem.   Skin: denies pallor, rash and wound.  Neurological: denies dizziness, seizures, syncope, weakness, light-headedness, numbness and headaches.   Hematological: denies  adenopathy, easy bruising, personal or family bleeding history.  Psychiatric/ Behavioral: denies suicidal ideation, mood changes, confusion, nervousness, sleep disturbance and agitation.       All other systems are reviewed and negative.    PHYSICAL EXAM: VS:  BP (!) 180/130 (BP Location: Left Arm, Patient Position: Sitting, Cuff Size: Large)   Pulse 64   Ht 5\' 8"  (1.727 m)   Wt (!) 318 lb 12.8 oz (144.6 kg)   SpO2 92%   BMI 48.47 kg/m  , BMI Body mass index is 48.47 kg/m. GEN: Well nourished, well developed, in no acute distress  HEENT: normal  Neck: no JVD, carotid bruits, or masses Cardiac: RRR; no murmurs, rubs, or gallops,no edema  Respiratory:  clear to auscultation bilaterally, normal work of breathing GI: soft, nontender, nondistended, + BS MS: no deformity or atrophy  Skin: warm and dry, no rash Neuro:  Strength and sensation are intact Psych: normal   EKG:  EKG is not ordered today.     Recent Labs: 02/21/2017: ALT 12; Pro B Natriuretic peptide (BNP) 1,145.0; TSH 4.78 02/25/2017: BUN 17; Creatinine, Ser 1.03; Hemoglobin 10.6; Platelets 217; Potassium 3.6; Sodium 140    Lipid Panel    Component Value Date/Time   CHOL 137 02/22/2017 0420   TRIG 54 02/22/2017 0420   HDL 29 (L) 02/22/2017 0420   CHOLHDL 4.7 02/22/2017 0420   VLDL 11 02/22/2017 0420   LDLCALC 97 02/22/2017 0420      Wt Readings from Last 3 Encounters:  03/01/17 (!) 318 lb 12.8 oz (144.6 kg)  02/28/17 (!) 319 lb (144.7 kg)  02/25/17 (!) 327 lb 6.1 oz (148.5 kg)      Other studies Reviewed: Additional studies/ records that were reviewed today include: . Review of the above records demonstrates:    ASSESSMENT AND PLAN:  1.  Hypertensive emergency: Amber Bentley was admitted to the hospital with a hypertensive emergency. She has been taking all of her medications. She notes that she is more fatigued than she was. Sleep.  Her echocardiogram reveals mild left ventricle systolic dysfunction. She  also has grade to  diastolic dysfunction.  Continue current medications. We will add Aldactone 25 mg a day. We'll have her return in 2 weeks for a nurse visit and blood pressure check.  2. Pulmonary hypertension: She was found have moderate pulmonary hypertension on echo. I suspect that she has obstructive sleep apnea and also has obesity hypoventilation. We'll set her up for a sleep study. She is already on home oxygen at night. I've encouraged weight loss.  3. Morbid obesity: I've encouraged her to call her medical doctor and be set up with a nutritionist.  Current medicines are reviewed at length with the patient today.  The patient does not have concerns regarding medicines.  Labs/ tests ordered today include:  No orders of the defined types were placed in this encounter.   Disposition:   FU with me in 6 weeks - sooner      Amber Miss, MD  03/01/2017 11:16 AM  [facility] [address], [place], [place]  [address] Phone: [phone]; Fax: [phone]

## 2017-03-01 NOTE — Patient Instructions (Addendum)
Medication Instructions:  START Aldactone (Spironolactone) 25 mg once daily   Labwork: Your physician recommends that you return for lab work in: 2 weeks for basic metabolic panel on the same day as your nurse visit   Testing/Procedures: Your physician has recommended that you have a sleep study. This test records several body functions during sleep, including: brain activity, eye movement, oxygen and carbon dioxide blood levels, heart rate and rhythm, breathing rate and rhythm, the flow of air through your mouth and nose, snoring, body muscle movements, and chest and belly movement.   Follow-Up: Your physician recommends that you return for Nurse Visit/BP check on Thursday March 29 at 11:00 am   Your physician recommends that you return for a follow-up appointment on: Friday April 27 with Dr. Elease Hashimoto   If you need a refill on your cardiac medications before your next appointment, please call your pharmacy.   Thank you for choosing [facility]! Eligha Bridegroom, RN [phone]

## 2017-03-05 ENCOUNTER — Encounter: Payer: Self-pay | Admitting: *Deleted

## 2017-03-05 ENCOUNTER — Other Ambulatory Visit: Payer: Self-pay | Admitting: *Deleted

## 2017-03-05 NOTE — Patient Outreach (Signed)
[facility]) Care Management  03/05/2017  Lekia Ziegenhorn [birth year] [id number]   Subjective: Telephone call to patient's home number, spoke with patient, and HIPAA verified.   Discussed [facility] Care Management  [facility] Transition of care and Link to Wellness program.  Patient voices understanding, is in agreement to follow up and declines Link to Wellness / Care Management services at this time. Patient states she is doing much better and ready to start taking better care of herself.   Has home oxygen and wears at night as needled. States this is the first time in 5 years that she has health insurance.   States she, her wife, and children moved to [place]  in December of 2017.  Patient has had follow up appointment with primary MD on 02/28/17 and cardiologist on 03/01/17.  States she takes daily weights and blood pressure readings.   States she receive heart failure educational packet in the hospital.   RNCM reinforced the importance of daily weights, monitoring for signs / symptoms of fluid retention, patient voices understanding, and able to verbalize things to report to MD.  Patient states she is also waiting for MD's office to call back to schedule sleep study and will MD's office on 03/08/17 if she has not received a call back.  Patient states she has joined a gym and is planning to go when she feels better.  RNCM educated patient on the free group exercise classes for [employer] employee/ spouse/ children 14 and over.  Patient is very appreciative of the information and states she will follow up.  States she wants to loose a lot of weight and keep it off.  Patient verbally given contact number for [facility] [phone]).   Patient in agreement to receive the following EMMI handout :Balanced Diet.     Patient educated on the following [employer] benefits: [facility], given phone number [phone])  for [facility] and states  she will follow up on getting prescriptions transferred for local pharmacy to [facility].   States she will also ask her wife to follow up with benefits specialist regarding hospital indemnity supplemental insurance, and file claims if appropriate.   Patient states she does not have any transition of care, care coordination, disease management, disease monitoring, transportation, community resource, or pharmacy needs at this time.  Patient states she is very appreciative of the follow up and is in agreement to complete follow up.   Objective: Per chart review, patient hospitalized 02/21/17 - 02/25/17 for hypertensive emergency.     Assessment:  Received [facility] Transition of care referral on 02/25/17 via [employer] report.    Transition of care follow up completed, no care management need, and will proceed with case closure.   Plan: RNCM will send patient successful outreach letter, [facility] pamphlet, magnet, and EMMI handout (Balanced  Diet ).     RNCM will send case closure due to follow up completed/ no care management needs request to Iverson Alamin at [facility] Care Management.    Jodel Mayhall H. Gardiner Barefoot, BSN, CCM [facility] Care Management [facility] Telephonic CM Phone: [phone] Fax: [phone]

## 2017-03-06 ENCOUNTER — Encounter: Payer: Self-pay | Admitting: Nurse Practitioner

## 2017-03-06 ENCOUNTER — Telehealth: Payer: Self-pay | Admitting: Cardiovascular Disease

## 2017-03-06 NOTE — Telephone Encounter (Signed)
New Message  Pt call requesting to speak with RN about getting a fax to her job stating she is able to return back to work for partial duty. Please call back to discuss   [facility] fax [phone]

## 2017-03-06 NOTE — Telephone Encounter (Signed)
Spoke with patient to review letter that will be faxed to her employer. She verbalized understanding and agreement with this wording. She asked about new onset flank pain that started today. She denies urinary retention, hematuria, or odor. She denies other symptoms and states she stays well hydrated with water consumption.  I advised that the spironolactone that was added at last office visit could be taxing her kidneys and advised we should go ahead and get bmet that was planned for next week. She states she has appointment with Alysia Penna, NP at [facility] tomorrow and asks if she can get lab work then. I confirmed and advised her to call back or seek immediate medical attention if symptoms worsen or if she notes decreased urine output, strong odor or dark color. She verbalized understanding and agreement with plan.

## 2017-03-07 ENCOUNTER — Other Ambulatory Visit (INDEPENDENT_AMBULATORY_CARE_PROVIDER_SITE_OTHER): Payer: 59

## 2017-03-07 DIAGNOSIS — I110 Hypertensive heart disease with heart failure: Secondary | ICD-10-CM | POA: Diagnosis not present

## 2017-03-07 DIAGNOSIS — I43 Cardiomyopathy in diseases classified elsewhere: Secondary | ICD-10-CM | POA: Diagnosis not present

## 2017-03-07 DIAGNOSIS — I10 Essential (primary) hypertension: Secondary | ICD-10-CM | POA: Diagnosis not present

## 2017-03-07 DIAGNOSIS — R601 Generalized edema: Secondary | ICD-10-CM | POA: Diagnosis not present

## 2017-03-07 LAB — BASIC METABOLIC PANEL
BUN: 23 mg/dL (ref 6–23)
CO2: 35 mEq/L — ABNORMAL HIGH (ref 19–32)
CREATININE: 1.12 mg/dL (ref 0.40–1.20)
Calcium: 9.8 mg/dL (ref 8.4–10.5)
Chloride: 101 mEq/L (ref 96–112)
GFR: 57.75 mL/min — AB (ref >60.00)
Glucose, Bld: 85 mg/dL (ref 70–99)
Potassium: 4.8 mEq/L (ref 3.5–5.1)
Sodium: 142 mEq/L (ref 135–145)

## 2017-03-14 ENCOUNTER — Ambulatory Visit (INDEPENDENT_AMBULATORY_CARE_PROVIDER_SITE_OTHER): Payer: 59 | Admitting: Nurse Practitioner

## 2017-03-14 DIAGNOSIS — I272 Pulmonary hypertension, unspecified: Secondary | ICD-10-CM | POA: Diagnosis not present

## 2017-03-14 DIAGNOSIS — I5032 Chronic diastolic (congestive) heart failure: Secondary | ICD-10-CM

## 2017-03-14 MED ORDER — AMLODIPINE BESYLATE 2.5 MG PO TABS: 2.5000 mg | ORAL_TABLET | Freq: Every day | ORAL | 11 refills | Status: DC

## 2017-03-14 MED ORDER — FUROSEMIDE 20 MG PO TABS: 20.0000 mg | ORAL_TABLET | Freq: Every day | ORAL | 11 refills | Status: DC

## 2017-03-14 NOTE — Progress Notes (Signed)
1.) Reason for visit: BP check and BMET for recent medication changes and elevated BP  2.) Name of MD requesting visit: Dr. Elease Hashimoto  3.) H&P: Patient presents for f/u from office visit on 3/16. Patient was recently hospitalized for hypertensive emergency and followed-up in our office on 3/16. She was advised to start aldactone 25 mg once daily and return today for recheck of BP and bmet.    4.) ROS related to problem: Patient reports significant weight loss due to radical change in sodium intake. She c/o dizziness and fatigue   5.) Assessment and plan per MD: decrease lasix to 20 mg daily and hold dose for tomorrow; decrease amlodipine to 2.5 mg daily; start exercise program and progress slowly; call back if dizziness worsens and keep f/u on 4/27 unless symptoms worsen

## 2017-03-14 NOTE — Patient Instructions (Addendum)
Medication Instructions:  DECREASE Amlodipine to  2.5 mg once daily DECREASE Lasix (Furosemide) to 20 mg once daily DO NOT TAKE LASIX ON Friday MARCH 30  Labwork: TODAY - basic metabolic panel   Testing/Procedures: None Ordered   Follow-Up: Keep your follow up appointment with Dr. Elease Hashimoto on April 27. Start walking with the goal of 30 min daily. Walk at a sustainable pace. Progress slowly to try to get 60 min of exercise. Call back if chest pain worsens or if dizziness does not improve with the medication changes.   If you need a refill on your cardiac medications before your next appointment, please call your pharmacy.   Thank you for choosing [facility]! Eligha Bridegroom, RN [phone]

## 2017-03-15 ENCOUNTER — Telehealth: Payer: Self-pay | Admitting: Pediatrics

## 2017-03-15 DIAGNOSIS — I10 Essential (primary) hypertension: Secondary | ICD-10-CM

## 2017-03-15 LAB — BASIC METABOLIC PANEL
BUN / CREAT RATIO: 21 (ref 9–23)
BUN: 30 mg/dL — AB (ref 6–20)
CO2: 27 mmol/L (ref 18–29)
CREATININE: 1.46 mg/dL — AB (ref 0.57–1.00)
Calcium: 10.2 mg/dL (ref 8.7–10.2)
Chloride: 95 mmol/L — ABNORMAL LOW (ref 96–106)
GFR, EST AFRICAN AMERICAN: 52 mL/min/{1.73_m2} — AB (ref >59)
GFR, EST NON AFRICAN AMERICAN: 45 mL/min/{1.73_m2} — AB (ref >59)
Glucose: 89 mg/dL (ref 65–99)
Potassium: 5.5 mmol/L — ABNORMAL HIGH (ref 3.5–5.2)
SODIUM: 140 mmol/L (ref 134–144)

## 2017-03-15 NOTE — Telephone Encounter (Signed)
I spoke with patient and advised results BP record recommendations, medication changes, and repeat labs.  Med list updated, lab orders entered and attached to appt.  Pt voiced understanding and agreed with plan.

## 2017-03-15 NOTE — Telephone Encounter (Signed)
-----   Message from Vesta Mixer, MD sent at 03/15/2017 10:25 AM EDT ----- Labs are c/w dehydration Also potassium is high DC lasix, DC kdur Continue aldactone for now Repeat BMP in 1-2 weeks She is to keep a record of her BP readings.

## 2017-03-21 MED FILL — LISINOPRIL 20 MG TAB: 20 | 30 days supply | Qty: 30 | Fill #0

## 2017-03-21 MED FILL — FUROSEMIDE 40 MG TABLET: 40 | 30 days supply | Qty: 30 | Fill #0

## 2017-03-21 MED FILL — PANTOPRAZOLE SOD DR 40 MG T: 40 | 30 days supply | Qty: 60 | Fill #0

## 2017-03-22 ENCOUNTER — Telehealth: Payer: Self-pay | Admitting: Nurse Practitioner

## 2017-03-22 MED ORDER — CARVEDILOL 12.5 MG PO TABS: 12.5000 mg | ORAL_TABLET | Freq: Two times a day (BID) | ORAL | 0 refills | Status: DC

## 2017-03-22 MED FILL — CARVEDILOL 12.5 MG TABLET: 12.5 | 30 days supply | Qty: 60 | Fill #0

## 2017-03-22 NOTE — Telephone Encounter (Signed)
rx sent to [facility] pharmacy, pt aware and appt is set with [facility].

## 2017-03-26 ENCOUNTER — Encounter: Payer: Self-pay | Admitting: Cardiovascular Disease

## 2017-03-29 ENCOUNTER — Other Ambulatory Visit: Payer: 59 | Admitting: *Deleted

## 2017-03-29 DIAGNOSIS — I5041 Acute combined systolic (congestive) and diastolic (congestive) heart failure: Secondary | ICD-10-CM | POA: Diagnosis not present

## 2017-03-29 DIAGNOSIS — I10 Essential (primary) hypertension: Secondary | ICD-10-CM

## 2017-03-29 DIAGNOSIS — I159 Secondary hypertension, unspecified: Secondary | ICD-10-CM | POA: Diagnosis not present

## 2017-03-29 DIAGNOSIS — I5032 Chronic diastolic (congestive) heart failure: Secondary | ICD-10-CM | POA: Diagnosis not present

## 2017-03-30 LAB — BASIC METABOLIC PANEL
BUN / CREAT RATIO: 19 (ref 9–23)
BUN: 25 mg/dL — ABNORMAL HIGH (ref 6–20)
CHLORIDE: 97 mmol/L (ref 96–106)
CO2: 26 mmol/L (ref 18–29)
CREATININE: 1.34 mg/dL — AB (ref 0.57–1.00)
Calcium: 9.4 mg/dL (ref 8.7–10.2)
GFR calc Af Amer: 58 mL/min/{1.73_m2} — ABNORMAL LOW (ref >59)
GFR calc non Af Amer: 50 mL/min/{1.73_m2} — ABNORMAL LOW (ref >59)
GLUCOSE: 78 mg/dL (ref 65–99)
Potassium: 5.2 mmol/L (ref 3.5–5.2)
SODIUM: 137 mmol/L (ref 134–144)

## 2017-04-01 ENCOUNTER — Ambulatory Visit (INDEPENDENT_AMBULATORY_CARE_PROVIDER_SITE_OTHER): Payer: 59 | Admitting: Nurse Practitioner

## 2017-04-01 ENCOUNTER — Encounter: Payer: Self-pay | Admitting: Nurse Practitioner

## 2017-04-01 VITALS — BP 120/64 | HR 66 | Temp 98.5°F | Ht 68.0 in | Wt 297.0 lb

## 2017-04-01 DIAGNOSIS — I110 Hypertensive heart disease with heart failure: Secondary | ICD-10-CM | POA: Diagnosis not present

## 2017-04-01 DIAGNOSIS — R601 Generalized edema: Secondary | ICD-10-CM | POA: Diagnosis not present

## 2017-04-01 DIAGNOSIS — R0981 Nasal congestion: Secondary | ICD-10-CM

## 2017-04-01 DIAGNOSIS — H8110 Benign paroxysmal vertigo, unspecified ear: Secondary | ICD-10-CM | POA: Diagnosis not present

## 2017-04-01 DIAGNOSIS — I10 Essential (primary) hypertension: Secondary | ICD-10-CM

## 2017-04-01 DIAGNOSIS — I43 Cardiomyopathy in diseases classified elsewhere: Secondary | ICD-10-CM | POA: Diagnosis not present

## 2017-04-01 DIAGNOSIS — R5383 Other fatigue: Secondary | ICD-10-CM

## 2017-04-01 MED ORDER — FLUTICASONE PROPIONATE 50 MCG/ACT NA SUSP: 2.0000 | Freq: Every day | NASAL | 3 refills | Status: DC

## 2017-04-01 MED FILL — FLUTICASONE PROP 50 MCG SPR: 50 | 30 days supply | Qty: 16 | Fill #0

## 2017-04-01 MED FILL — SPIRONOLACTONE 25 MG TABLET: 25 | 90 days supply | Qty: 90 | Fill #0

## 2017-04-01 NOTE — Progress Notes (Signed)
Pre visit review using our clinic review tool, if applicable. No additional management support is needed unless otherwise documented below in the visit note. 

## 2017-04-01 NOTE — Patient Instructions (Addendum)
You will be contacted with appt for sleep study.  If no improvement in dizziness in 1week, will refer to PT.  Maintain upcoming appt with cardiology.  OK to increase working hours to 5hrs per day only. Inform cardiologist about increase in work hours.  Benign Positional Vertigo Vertigo is the feeling that you or your surroundings are moving when they are not. Benign positional vertigo is the most common form of vertigo. The cause of this condition is not serious (is benign). This condition is triggered by certain movements and positions (is positional). This condition can be dangerous if it occurs while you are doing something that could endanger you or others, such as driving. What are the causes? In many cases, the cause of this condition is not known. It may be caused by a disturbance in an area of the inner ear that helps your brain to sense movement and balance. This disturbance can be caused by a viral infection (labyrinthitis), head injury, or repetitive motion. What increases the risk? This condition is more likely to develop in:  Women.  People who are 17 years of age or older. What are the signs or symptoms? Symptoms of this condition usually happen when you move your head or your eyes in different directions. Symptoms may start suddenly, and they usually last for less than a minute. Symptoms may include:  Loss of balance and falling.  Feeling like you are spinning or moving.  Feeling like your surroundings are spinning or moving.  Nausea and vomiting.  Blurred vision.  Dizziness.  Involuntary eye movement (nystagmus). Symptoms can be mild and cause only slight annoyance, or they can be severe and interfere with daily life. Episodes of benign positional vertigo may return (recur) over time, and they may be triggered by certain movements. Symptoms may improve over time. How is this diagnosed? This condition is usually diagnosed by medical history and a physical exam of  the head, neck, and ears. You may be referred to a health care provider who specializes in ear, nose, and throat (ENT) problems (otolaryngologist) or a provider who specializes in disorders of the nervous system (neurologist). You may have additional testing, including:  MRI.  A CT scan.  Eye movement tests. Your health care provider may ask you to change positions quickly while he or she watches you for symptoms of benign positional vertigo, such as nystagmus. Eye movement may be tested with an electronystagmogram (ENG), caloric stimulation, the Dix-Hallpike test, or the roll test.  An electroencephalogram (EEG). This records electrical activity in your brain.  Hearing tests. How is this treated? Usually, your health care provider will treat this by moving your head in specific positions to adjust your inner ear back to normal. Surgery may be needed in severe cases, but this is rare. In some cases, benign positional vertigo may resolve on its own in 2-4 weeks. Follow these instructions at home: Safety   Move slowly.Avoid sudden body or head movements.  Avoid driving.  Avoid operating heavy machinery.  Avoid doing any tasks that would be dangerous to you or others if a vertigo episode would occur.  If you have trouble walking or keeping your balance, try using a cane for stability. If you feel dizzy or unstable, sit down right away.  Return to your normal activities as told by your health care provider. Ask your health care provider what activities are safe for you. General instructions   Take over-the-counter and prescription medicines only as told by your health care  provider.  Avoid certain positions or movements as told by your health care provider.  Drink enough fluid to keep your urine clear or pale yellow.  Keep all follow-up visits as told by your health care provider. This is important. Contact a health care provider if:  You have a fever.  Your condition gets worse  or you develop new symptoms.  Your family or friends notice any behavioral changes.  Your nausea or vomiting gets worse.  You have numbness or a "pins and needles" sensation. Get help right away if:  You have difficulty speaking or moving.  You are always dizzy.  You faint.  You develop severe headaches.  You have weakness in your legs or arms.  You have changes in your hearing or vision.  You develop a stiff neck.  You develop sensitivity to light. This information is not intended to replace advice given to you by your health care provider. Make sure you discuss any questions you have with your health care provider. Document Released: 09/10/2006 Document Revised: 05/10/2016 Document Reviewed: 03/28/2015 Elsevier Interactive Patient Education  2017 Elsevier Inc.    DASH Eating Plan DASH stands for "Dietary Approaches to Stop Hypertension." The DASH eating plan is a healthy eating plan that has been shown to reduce high blood pressure (hypertension). It may also reduce your risk for type 2 diabetes, heart disease, and stroke. The DASH eating plan may also help with weight loss. What are tips for following this plan? General guidelines   Avoid eating more than 2,300 mg (milligrams) of salt (sodium) a day. If you have hypertension, you may need to reduce your sodium intake to 1,500 mg a day.  Limit alcohol intake to no more than 1 drink a day for nonpregnant women and 2 drinks a day for men. One drink equals 12 oz of beer, 5 oz of wine, or 1 oz of hard liquor.  Work with your health care provider to maintain a healthy body weight or to lose weight. Ask what an ideal weight is for you.  Get at least 30 minutes of exercise that causes your heart to beat faster (aerobic exercise) most days of the week. Activities may include walking, swimming, or biking.  Work with your health care provider or diet and nutrition specialist (dietitian) to adjust your eating plan to your  individual calorie needs. Reading food labels   Check food labels for the amount of sodium per serving. Choose foods with less than 5 percent of the Daily Value of sodium. Generally, foods with less than 300 mg of sodium per serving fit into this eating plan.  To find whole grains, look for the word "whole" as the first word in the ingredient list. Shopping   Buy products labeled as "low-sodium" or "no salt added."  Buy fresh foods. Avoid canned foods and premade or frozen meals. Cooking   Avoid adding salt when cooking. Use salt-free seasonings or herbs instead of table salt or sea salt. Check with your health care provider or pharmacist before using salt substitutes.  Do not fry foods. Cook foods using healthy methods such as baking, boiling, grilling, and broiling instead.  Cook with heart-healthy oils, such as olive, canola, soybean, or sunflower oil. Meal planning    Eat a balanced diet that includes:  5 or more servings of fruits and vegetables each day. At each meal, try to fill half of your plate with fruits and vegetables.  Up to 6-8 servings of whole grains each day.  Less than 6 oz of lean meat, poultry, or fish each day. A 3-oz serving of meat is about the same size as a deck of cards. One egg equals 1 oz.  2 servings of low-fat dairy each day.  A serving of nuts, seeds, or beans 5 times each week.  Heart-healthy fats. Healthy fats called Omega-3 fatty acids are found in foods such as flaxseeds and coldwater fish, like sardines, salmon, and mackerel.  Limit how much you eat of the following:  Canned or prepackaged foods.  Food that is high in trans fat, such as fried foods.  Food that is high in saturated fat, such as fatty meat.  Sweets, desserts, sugary drinks, and other foods with added sugar.  Full-fat dairy products.  Do not salt foods before eating.  Try to eat at least 2 vegetarian meals each week.  Eat more home-cooked food and less restaurant,  buffet, and fast food.  When eating at a restaurant, ask that your food be prepared with less salt or no salt, if possible. What foods are recommended? The items listed may not be a complete list. Talk with your dietitian about what dietary choices are best for you. Grains  Whole-grain or whole-wheat bread. Whole-grain or whole-wheat pasta. Brown rice. Orpah Cobb. Bulgur. Whole-grain and low-sodium cereals. Pita bread. Low-fat, low-sodium crackers. Whole-wheat flour tortillas. Vegetables  Fresh or frozen vegetables (raw, steamed, roasted, or grilled). Low-sodium or reduced-sodium tomato and vegetable juice. Low-sodium or reduced-sodium tomato sauce and tomato paste. Low-sodium or reduced-sodium canned vegetables. Fruits  All fresh, dried, or frozen fruit. Canned fruit in natural juice (without added sugar). Meat and other protein foods  Skinless chicken or [place]. Ground chicken or [place]. Pork with fat trimmed off. Fish and seafood. Egg whites. Dried beans, peas, or lentils. Unsalted nuts, nut butters, and seeds. Unsalted canned beans. Lean cuts of beef with fat trimmed off. Low-sodium, lean deli meat. Dairy  Low-fat (1%) or fat-free (skim) milk. Fat-free, low-fat, or reduced-fat cheeses. Nonfat, low-sodium ricotta or cottage cheese. Low-fat or nonfat yogurt. Low-fat, low-sodium cheese. Fats and oils  Soft margarine without trans fats. Vegetable oil. Low-fat, reduced-fat, or light mayonnaise and salad dressings (reduced-sodium). Canola, safflower, olive, soybean, and sunflower oils. Avocado. Seasoning and other foods  Herbs. Spices. Seasoning mixes without salt. Unsalted popcorn and pretzels. Fat-free sweets. What foods are not recommended? The items listed may not be a complete list. Talk with your dietitian about what dietary choices are best for you. Grains  Baked goods made with fat, such as croissants, muffins, or some breads. Dry pasta or rice meal packs. Vegetables  Creamed or  fried vegetables. Vegetables in a cheese sauce. Regular canned vegetables (not low-sodium or reduced-sodium). Regular canned tomato sauce and paste (not low-sodium or reduced-sodium). Regular tomato and vegetable juice (not low-sodium or reduced-sodium). Rosita Fire. Olives. Fruits  Canned fruit in a light or heavy syrup. Fried fruit. Fruit in cream or butter sauce. Meat and other protein foods  Fatty cuts of meat. Ribs. Fried meat. Tomasa Blase. Sausage. Bologna and other processed lunch meats. Salami. Fatback. Hotdogs. Bratwurst. Salted nuts and seeds. Canned beans with added salt. Canned or smoked fish. Whole eggs or egg yolks. Chicken or [place] with skin. Dairy  Whole or 2% milk, cream, and half-and-half. Whole or full-fat cream cheese. Whole-fat or sweetened yogurt. Full-fat cheese. Nondairy creamers. Whipped toppings. Processed cheese and cheese spreads. Fats and oils  Butter. Stick margarine. Lard. Shortening. Ghee. Bacon fat. Tropical oils, such as coconut, palm kernel,  or palm oil. Seasoning and other foods  Salted popcorn and pretzels. Onion salt, garlic salt, seasoned salt, table salt, and sea salt. Worcestershire sauce. Tartar sauce. Barbecue sauce. Teriyaki sauce. Soy sauce, including reduced-sodium. Steak sauce. Canned and packaged gravies. Fish sauce. Oyster sauce. Cocktail sauce. Horseradish that you find on the shelf. Ketchup. Mustard. Meat flavorings and tenderizers. Bouillon cubes. Hot sauce and Tabasco sauce. Premade or packaged marinades. Premade or packaged taco seasonings. Relishes. Regular salad dressings. Where to find more information:  National Heart, Lung, and Blood Institute: PopSteam.is  American Heart Association: www.heart.org Summary  The DASH eating plan is a healthy eating plan that has been shown to reduce high blood pressure (hypertension). It may also reduce your risk for type 2 diabetes, heart disease, and stroke.  With the DASH eating plan, you should limit salt  (sodium) intake to 2,300 mg a day. If you have hypertension, you may need to reduce your sodium intake to 1,500 mg a day.  When on the DASH eating plan, aim to eat more fresh fruits and vegetables, whole grains, lean proteins, low-fat dairy, and heart-healthy fats.  Work with your health care provider or diet and nutrition specialist (dietitian) to adjust your eating plan to your individual calorie needs. This information is not intended to replace advice given to you by your health care provider. Make sure you discuss any questions you have with your health care provider. Document Released: 11/22/2011 Document Revised: 11/26/2016 Document Reviewed: 11/26/2016 Elsevier Interactive Patient Education  2017 [employer].

## 2017-04-01 NOTE — Progress Notes (Signed)
Subjective:  Patient ID: Amber Bentley, female    DOB: 1976-08-08  Age: 41 y.o. MRN: [id number]  CC: Follow-up (1 mo fu/dizzy/med refill)   Dizziness  This is a new problem. The current episode started 1 to 4 weeks ago. The problem occurs intermittently. Associated symptoms include congestion, fatigue and vertigo. Pertinent negatives include no abdominal pain, anorexia, change in bowel habit, chills, diaphoresis, fever, headaches, myalgias, neck pain, numbness, sore throat, visual change or weakness. Associated symptoms comments: Intermittent ringing in ears associated with dizzy spells. Exacerbated by: rapid head movement. She has tried rest and drinking for the symptoms. The treatment provided mild relief.    Occassional light headedness with sudden head movement or changing position from sitting to stadning. Onset within the last 58month. intermmittent. No syncope, no palpitations Has ringing in ears with dizzy episodes.  HTN: Controlled BP. Taking medications as prescribed. Persistent LE edema. Furosemide switched to spironolactone due to kidney function.  Outpatient Medications Prior to Visit  Medication Sig Dispense Refill  . amLODipine (NORVASC) 2.5 MG tablet Take 1 tablet (2.5 mg total) by mouth daily. 30 tablet 11  . aspirin 81 MG tablet Take 1 tablet (81 mg total) by mouth daily. 30 tablet 1  . carvedilol (COREG) 12.5 MG tablet Take 1 tablet (12.5 mg total) by mouth 2 (two) times daily with a meal. 60 tablet 0  . lisinopril (PRINIVIL,ZESTRIL) 20 MG tablet Take 1 tablet (20 mg total) by mouth daily. 30 tablet 1  . pantoprazole (PROTONIX) 40 MG tablet Take 1 tablet (40 mg total) by mouth 2 (two) times daily. 60 tablet 1  . spironolactone (ALDACTONE) 25 MG tablet Take 1 tablet (25 mg total) by mouth daily. 90 tablet 3  . ibuprofen (ADVIL,MOTRIN) 200 MG tablet Take 1,000 mg by mouth every 6 (six) hours as needed for headache, mild pain or moderate pain.     No facility-administered  medications prior to visit.     ROS See HPI  Objective:  BP 120/64   Pulse 66   Temp 98.5 F (36.9 C)   Ht 5\' 8"  (1.727 m)   Wt 297 lb (134.7 kg)   SpO2 98%   BMI 45.16 kg/m   BP Readings from Last 3 Encounters:  04/01/17 120/64  03/14/17 120/80  03/01/17 (!) 180/130    Wt Readings from Last 3 Encounters:  04/01/17 297 lb (134.7 kg)  03/14/17 291 lb (132 kg)  03/05/17 (!) 303 lb (137.4 kg)    Physical Exam  Constitutional: She is oriented to person, place, and time. No distress.  HENT:  Right Ear: Tympanic membrane, external ear and ear canal normal.  Left Ear: Tympanic membrane, external ear and ear canal normal.  Nose: Nose normal. No mucosal edema or rhinorrhea. Right sinus exhibits no maxillary sinus tenderness and no frontal sinus tenderness. Left sinus exhibits no maxillary sinus tenderness and no frontal sinus tenderness.  Mouth/Throat: Uvula is midline and oropharynx is clear and moist. No oropharyngeal exudate.  Eyes: Conjunctivae and EOM are normal. Pupils are equal, round, and reactive to light. No scleral icterus.  Neck: Normal range of motion. Neck supple. No thyromegaly present.  Cardiovascular: Normal rate and normal heart sounds.   Pulmonary/Chest: Effort normal and breath sounds normal.  Musculoskeletal: She exhibits edema.  Lymphadenopathy:    She has no cervical adenopathy.  Neurological: She is alert and oriented to person, place, and time.  Skin: Skin is warm. There is erythema.  Vitals reviewed.   Lab Results  Component Value Date   WBC 8.1 02/25/2017   HGB 10.6 (L) 02/25/2017   HCT 37.8 02/25/2017   PLT 217 02/25/2017   GLUCOSE 78 03/29/2017   CHOL 137 02/22/2017   TRIG 54 02/22/2017   HDL 29 (L) 02/22/2017   LDLCALC 97 02/22/2017   ALT 12 02/21/2017   AST 13 02/21/2017   NA 137 03/29/2017   K 5.2 03/29/2017   CL 97 03/29/2017   CREATININE 1.34 (H) 03/29/2017   BUN 25 (H) 03/29/2017   CO2 26 03/29/2017   TSH 4.78 (H) 02/21/2017    HGBA1C 5.8 02/21/2017    Dg Chest 2 View  Result Date: 02/21/2017 CLINICAL DATA:  42 y/o female with cough fatigue congestion and fluid retention for 1 month. Initial encounter. Smoker. EXAM: CHEST  2 VIEW COMPARISON:  None. FINDINGS: Large body habitus. Moderate to severe cardiomegaly. No pneumothorax, pleural effusion or consolidation, but there is peripheral linear increased mostly interstitial appearing opacity in both lungs, more apparent on the left. No acute osseous abnormality identified. Negative visible bowel gas pattern. IMPRESSION: 1. Mild pulmonary interstitial edema suspected. No pleural effusion. 2. Moderate to severe cardiomegaly.  Consider pericardial effusion. Electronically Signed   By: Odessa Fleming M.D.   On: 02/21/2017 15:35    Assessment & Plan:   Kwanza was seen today for follow-up.  Diagnoses and all orders for this visit:  HTN (hypertension), benign -     Ambulatory referral to Neurology  Hypertensive cardiomyopathy, 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HCC) -     Ambulatory referral to Neurology  Generalized edema -     Ambulatory referral to Neurology  Fatigue, unspecified type -     Ambulatory referral to Neurology  Benign paroxysmal positional vertigo, unspecified laterality  Sinus congestion -     fluticasone (FLONASE) 50 MCG/ACT nasal spray; Place 2 sprays into both nostrils daily.   I have discontinued [name]'s ibuprofen and furosemide. I am also having her start on fluticasone. Additionally, I am having her maintain her aspirin, lisinopril, pantoprazole, spironolactone, amLODipine, and carvedilol.  Meds ordered this encounter  Medications  . DISCONTD: furosemide (LASIX) 40 MG tablet    Sig: Take 40 mg by mouth.    Refill:  0  . fluticasone (FLONASE) 50 MCG/ACT nasal spray    Sig: Place 2 sprays into both nostrils daily.    Dispense:  16 g    Refill:  3    Order Specific Question:   Supervising Provider    Answer:   Tresa Garter [[id number]]     Follow-up: Return in about 3 months (around 07/01/2017) for CPE (fasting).  Alysia Penna, NP

## 2017-04-02 ENCOUNTER — Encounter: Payer: Self-pay | Admitting: Nurse Practitioner

## 2017-04-04 ENCOUNTER — Encounter: Payer: Self-pay | Admitting: Cardiovascular Disease

## 2017-04-05 ENCOUNTER — Telehealth: Payer: Self-pay | Admitting: Cardiovascular Disease

## 2017-04-05 NOTE — Telephone Encounter (Signed)
Meddie is returning a call. Please call

## 2017-04-05 NOTE — Telephone Encounter (Signed)
Called patient back to discuss symptoms of chest pain. Dr. Elease Hashimoto spoke with patient by telephone and she states she went out for a long walk yesterday and had chest pain. She states pain is lingering today and may be worse with movement. She denies SOB, n/v, diaphoresis. Dr. Elease Hashimoto advised her to try Motrin for pain and to go to [facility] ER if pain worsens over the weekend. He asked that I schedule her to see him in the office on Monday at 10:00 if she does not have to be admitted over the weekend. He states she verbalized understanding and agreement with plan.

## 2017-04-05 NOTE — Telephone Encounter (Signed)
Pt has been having some CP. Attempted to call Left a message for her to call back     Kristeen Miss, MD  04/05/2017 11:07 AM    [facility] [address] [place],  [address] [place], [place]  [address] Pager [phone] Phone: [phone]; Fax: [phone]

## 2017-04-08 ENCOUNTER — Encounter: Payer: Self-pay | Admitting: Cardiovascular Disease

## 2017-04-08 ENCOUNTER — Ambulatory Visit (INDEPENDENT_AMBULATORY_CARE_PROVIDER_SITE_OTHER): Payer: 59 | Admitting: Cardiovascular Disease

## 2017-04-08 VITALS — BP 126/80 | HR 55 | Ht 68.0 in | Wt 303.0 lb

## 2017-04-08 DIAGNOSIS — I10 Essential (primary) hypertension: Secondary | ICD-10-CM | POA: Diagnosis not present

## 2017-04-08 DIAGNOSIS — R079 Chest pain, unspecified: Secondary | ICD-10-CM

## 2017-04-08 DIAGNOSIS — I110 Hypertensive heart disease with heart failure: Secondary | ICD-10-CM

## 2017-04-08 DIAGNOSIS — Z13220 Encounter for screening for lipoid disorders: Secondary | ICD-10-CM

## 2017-04-08 DIAGNOSIS — I43 Cardiomyopathy in diseases classified elsewhere: Secondary | ICD-10-CM

## 2017-04-08 LAB — COMPREHENSIVE METABOLIC PANEL
ALK PHOS: 80 IU/L (ref 39–117)
ALT: 16 IU/L (ref 0–32)
AST: 14 IU/L (ref 0–40)
Albumin/Globulin Ratio: 1.5 (ref 1.2–2.2)
Albumin: 4.3 g/dL (ref 3.5–5.5)
BILIRUBIN TOTAL: 0.3 mg/dL (ref 0.0–1.2)
BUN/Creatinine Ratio: 21 (ref 9–23)
BUN: 24 mg/dL — ABNORMAL HIGH (ref 6–20)
CO2: 28 mmol/L (ref 18–29)
Calcium: 9.4 mg/dL (ref 8.7–10.2)
Chloride: 101 mmol/L (ref 96–106)
Creatinine, Ser: 1.16 mg/dL — ABNORMAL HIGH (ref 0.57–1.00)
GFR calc Af Amer: 69 mL/min/{1.73_m2} (ref >59)
GFR calc non Af Amer: 60 mL/min/{1.73_m2} (ref >59)
GLOBULIN, TOTAL: 2.9 g/dL (ref 1.5–4.5)
Glucose: 93 mg/dL (ref 65–99)
POTASSIUM: 5 mmol/L (ref 3.5–5.2)
SODIUM: 143 mmol/L (ref 134–144)
Total Protein: 7.2 g/dL (ref 6.0–8.5)

## 2017-04-08 LAB — LIPID PANEL
CHOLESTEROL TOTAL: 204 mg/dL — AB (ref 100–199)
Chol/HDL Ratio: 3.3 ratio (ref 0.0–4.4)
HDL: 61 mg/dL (ref >39)
LDL Calculated: 132 mg/dL — ABNORMAL HIGH (ref 0–99)
Triglycerides: 54 mg/dL (ref 0–149)
VLDL CHOLESTEROL CAL: 11 mg/dL (ref 5–40)

## 2017-04-08 LAB — TROPONIN T

## 2017-04-08 LAB — TROPONIN I: TROPONIN I: 0.01 ng/mL (ref 0.00–0.04)

## 2017-04-08 NOTE — Patient Instructions (Signed)
Medication Instructions:  Your physician recommends that you continue on your current medications as directed. Please refer to the Current Medication list given to you today.   Labwork: TODAY - Troponin, CMET, Cholesterol   Testing/Procedures: None Ordered   Follow-Up: Your physician recommends that you schedule a follow-up appointment in: 3 months with Dr. Elease Hashimoto.    If you need a refill on your cardiac medications before your next appointment, please call your pharmacy.   Thank you for choosing [facility]! Eligha Bridegroom, RN [phone]

## 2017-04-08 NOTE — Addendum Note (Signed)
Addended by: Levi Aland on: 04/08/2017 12:29 PM   Modules accepted: Orders

## 2017-04-08 NOTE — Progress Notes (Addendum)
Cardiology Office Note   Date:  04/08/2017   ID:  Amber Bentley, DOB [birth year], MRN [id number]  PCP:  Alysia Penna, NP  Cardiologist:   Kristeen Miss, MD   Chief Complaint  Patient presents with  . Chest Pain   Problem list 1. Hypertensive emergency 2. Morbid obesity 3. Pulmonary hypertension-moderate 4. Possible sleep apnea   History of Present Illness: Amber Bentley is a 41 y.o. female who presents for follow-up of her recent hospitalization for hypertensive emergency.  Amber Bentley has had high blood pressure for several years but has not been on medications.  I saw her in the [facility] emergency. Room. She has been started on medications. She was in the hospital for 4 days  She seems to be tolerating her medications without any problems.  She had an echocardiogram which revealed mildly depressed left ventricle systolic function with an EF of 45-50%. She has grade 2 diastolic dysfunction.  She has moderate pulmonary hypertension with an estimate PA pressure of 59.  Is avoiding salt. 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  Is a Administrator .  Smokes some .    April 08, 2017:  Amber Bentley is seen as a work in visit today for chest pain . Stopped smoking in March .  Has gained some weight .  Wt. Today is 303 . Wt Readings from Last 3 Encounters:  04/08/17 (!) 303 lb (137.4 kg)  04/01/17 297 lb (134.7 kg)  03/14/17 291 lb (132 kg)    Amber Bentley started having some CP several years ago . Overall had a pretty good weekend - not much in the way of CP. The CP is a heaviness,   Last for hours.   Lasted most of the day yesterday . Seems to related to upper body movement - was not worsened with walking .     Has a significant family hx of CAD     Past Medical History:  Diagnosis Date  . Anxiety   . Depression     Past Surgical History:  Procedure Laterality Date  . ABDOMINAL HYSTERECTOMY        Current Outpatient Prescriptions  Medication Sig Dispense Refill  . amLODipine (NORVASC) 2.5 MG tablet Take 1 tablet (2.5 mg total) by mouth daily. 30 tablet 11  . aspirin 81 MG tablet Take 1 tablet (81 mg total) by mouth daily. 30 tablet 1  . carvedilol (COREG) 12.5 MG tablet Take 1 tablet (12.5 mg total) by mouth 2 (two) times daily with a meal. 60 tablet 0  . fluticasone (FLONASE) 50 MCG/ACT nasal spray Place 2 sprays into both nostrils daily. 16 g 3  . lisinopril (PRINIVIL,ZESTRIL) 20 MG tablet Take 1 tablet (20 mg total) by mouth daily. 30 tablet 1  . pantoprazole (PROTONIX) 40 MG tablet Take 1 tablet (40 mg total) by mouth 2 (two) times daily. 60 tablet 1  . spironolactone (ALDACTONE) 25 MG tablet Take 1 tablet (25 mg total) by mouth daily. 90 tablet 3   No current facility-administered medications for this visit.     No flowsheet data found.    Allergies:   Food and Aleve [naproxen]    Social History:  The patient  reports that she has quit smoking. Her smoking use included Cigarettes. She has a 2.50 pack-year smoking history. She has never used smokeless tobacco. She reports that she drinks alcohol. She reports that she does  not use drugs.   Family History:  The patient's family history includes Cancer in her maternal aunt, mother, and paternal grandfather; Diabetes in her father; Heart disease (age of onset: 50) in her father; Hyperlipidemia in her father; Hypertension in her mother; Sudden death in her brother; Thyroid disease in her sister.    ROS:  Please see the history of present illness.    Review of Systems: Constitutional:  denies fever, chills, diaphoresis, appetite change and fatigue.  HEENT: denies photophobia, eye pain, redness, hearing loss, ear pain, congestion, sore throat, rhinorrhea, sneezing, neck pain, neck stiffness and tinnitus.  Respiratory: denies SOB, DOE, cough, chest tightness, and wheezing.  Cardiovascular: denies chest pain, palpitations  and leg swelling.  Gastrointestinal: denies nausea, vomiting, abdominal pain, diarrhea, constipation, blood in stool.  Genitourinary: denies dysuria, urgency, frequency, hematuria, flank pain and difficulty urinating.  Musculoskeletal: denies  myalgias, back pain, joint swelling, arthralgias and gait problem.   Skin: denies pallor, rash and wound.  Neurological: denies dizziness, seizures, syncope, weakness, light-headedness, numbness and headaches.   Hematological: denies adenopathy, easy bruising, personal or family bleeding history.  Psychiatric/ Behavioral: denies suicidal ideation, mood changes, confusion, nervousness, sleep disturbance and agitation.       All other systems are reviewed and negative.    PHYSICAL EXAM: VS:  BP 126/80 (BP Location: Right Arm, Cuff Size: Large)   Pulse (!) 55   Ht 5\' 8"  (1.727 m)   Wt (!) 303 lb (137.4 kg)   SpO2 95%   BMI 46.07 kg/m  , BMI Body mass index is 46.07 kg/m. GEN: Well nourished, well developed, in no acute distress  HEENT: normal  Neck: no JVD, carotid bruits, or masses Cardiac: RRR; no murmurs, rubs, or gallops,no edema  Respiratory:  clear to auscultation bilaterally, normal work of breathing GI: soft, nontender, nondistended, + BS MS: no deformity or atrophy  Skin: warm and dry, no rash Neuro:  Strength and sensation are intact Psych: normal   EKG:  EKG is ordered today.  Sinus brady 55.   No ST or T wave changes.      Recent Labs: 02/21/2017: ALT 12; Pro B Natriuretic peptide (BNP) 1,145.0; TSH 4.78 02/25/2017: Hemoglobin 10.6; Platelets 217 03/29/2017: BUN 25; Creatinine, Ser 1.34; Potassium 5.2; Sodium 137    Lipid Panel    Component Value Date/Time   CHOL 137 02/22/2017 0420   TRIG 54 02/22/2017 0420   HDL 29 (L) 02/22/2017 0420   CHOLHDL 4.7 02/22/2017 0420   VLDL 11 02/22/2017 0420   LDLCALC 97 02/22/2017 0420      Wt Readings from Last 3 Encounters:  04/08/17 (!) 303 lb (137.4 kg)  04/01/17 297 lb  (134.7 kg)  03/14/17 291 lb (132 kg)      Other studies Reviewed: Additional studies/ records that were reviewed today include: . Review of the above records demonstrates:    ASSESSMENT AND PLAN:  1. Chest pain: Amber Bentley presents today with some chest tightness and chest pain. This seems to be more related to upper body movement and not necessarily walking. She has a strong family history of coronary artery disease.  She also has a history of cigarette hypertension, cigarette smoking. We'll check her lipid levels today.  Her weight is 303 and I do not think that I stress Myoview study will be easily interpreted. We will we will not order a Myoview for that reason. Because she has had many hours of chest pain over the weekend, we'll check a troponin  level today. If the troponin level is negative than that is fairly reassuring that this is not likely an acute coronary syndrome. If the troponin level is abnormal then we will need to proceed with cardiac catheterization.  2.   Hypertensive emergency: Amber Bentley was admitted to the hospital with a hypertensive emergency. She has been taking all of her medications. She notes that she is more fatigued than she was. Sleep.  Her echocardiogram reveals mild left ventricle systolic dysfunction. She also has grade to  diastolic dysfunction.  BP has been much better  Continue current medications.  3.  Pulmonary hypertension: She was found have moderate pulmonary hypertension on echo. I suspect that she has obstructive sleep apnea and also has obesity hypoventilation. We'll set her up for a sleep study. She is already on home oxygen at night. I've encouraged weight loss.  4. Morbid obesity: I've encouraged her to call her medical doctor and be set up with a nutritionist.  Current medicines are reviewed at length with the patient today.  The patient does not have concerns regarding medicines.  Labs/ tests ordered today include:  No orders of the defined  types were placed in this encounter.   Disposition:   FU with me in 3 months - sooner if needed - especially if she has more chest tightness.     Kristeen Miss, MD  04/08/2017 10:31 AM    [facility] [address] [place], [place], [place]  [address] Phone: [phone]; Fax: [phone]

## 2017-04-10 ENCOUNTER — Encounter: Payer: Self-pay | Admitting: Cardiovascular Disease

## 2017-04-10 ENCOUNTER — Telehealth: Payer: Self-pay | Admitting: Nurse Practitioner

## 2017-04-10 DIAGNOSIS — E782 Mixed hyperlipidemia: Secondary | ICD-10-CM

## 2017-04-10 DIAGNOSIS — I119 Hypertensive heart disease without heart failure: Secondary | ICD-10-CM

## 2017-04-10 MED ORDER — ROSUVASTATIN CALCIUM 10 MG PO TABS: 10.0000 mg | ORAL_TABLET | Freq: Every day | ORAL | 3 refills | Status: DC

## 2017-04-10 NOTE — Telephone Encounter (Signed)
Reviewed results and plan of care with patient who verbalized understanding and agreement. She is scheduled for repeat lab work in August. I advised her to call back if she has questions or concerns prior to next appointment. She thanked me for the call.

## 2017-04-10 NOTE — Telephone Encounter (Signed)
Ivin Poot, what's the name of the person and the business we need to fax the note to?

## 2017-04-10 NOTE — Telephone Encounter (Signed)
-----   Message from Vesta Mixer, MD sent at 04/09/2017  6:06 AM EDT ----- Troponin is negative LDL cholesterol is elevated Please have her start Rosuvastatin 10 mg a day  Check CMET and lipids in 3 months  CMET otherwise looks good

## 2017-04-10 NOTE — Telephone Encounter (Signed)
Referral to nutrition management placed per patient request

## 2017-04-12 ENCOUNTER — Ambulatory Visit: Payer: 59 | Admitting: Cardiovascular Disease

## 2017-04-13 ENCOUNTER — Encounter (HOSPITAL_COMMUNITY): Payer: Self-pay

## 2017-04-13 ENCOUNTER — Emergency Department (HOSPITAL_COMMUNITY): Payer: 59

## 2017-04-13 DIAGNOSIS — Z79899 Other long term (current) drug therapy: Secondary | ICD-10-CM | POA: Diagnosis not present

## 2017-04-13 DIAGNOSIS — R079 Chest pain, unspecified: Secondary | ICD-10-CM | POA: Insufficient documentation

## 2017-04-13 DIAGNOSIS — I5032 Chronic diastolic (congestive) heart failure: Secondary | ICD-10-CM | POA: Diagnosis not present

## 2017-04-13 DIAGNOSIS — R0602 Shortness of breath: Secondary | ICD-10-CM | POA: Diagnosis not present

## 2017-04-13 DIAGNOSIS — Z7982 Long term (current) use of aspirin: Secondary | ICD-10-CM | POA: Insufficient documentation

## 2017-04-13 DIAGNOSIS — I110 Hypertensive heart disease with heart failure: Secondary | ICD-10-CM | POA: Diagnosis not present

## 2017-04-13 DIAGNOSIS — Z87891 Personal history of nicotine dependence: Secondary | ICD-10-CM | POA: Diagnosis not present

## 2017-04-13 LAB — BASIC METABOLIC PANEL
Anion gap: 12 (ref 5–15)
BUN: 37 mg/dL — AB (ref 6–20)
CO2: 26 mmol/L (ref 22–32)
Calcium: 9.3 mg/dL (ref 8.9–10.3)
Chloride: 100 mmol/L — ABNORMAL LOW (ref 101–111)
Creatinine, Ser: 1.76 mg/dL — ABNORMAL HIGH (ref 0.44–1.00)
GFR calc Af Amer: 41 mL/min — ABNORMAL LOW (ref >60)
GFR, EST NON AFRICAN AMERICAN: 36 mL/min — AB (ref >60)
GLUCOSE: 106 mg/dL — AB (ref 65–99)
POTASSIUM: 4.9 mmol/L (ref 3.5–5.1)
Sodium: 138 mmol/L (ref 135–145)

## 2017-04-13 LAB — CBC
HCT: 41.9 % (ref 36.0–46.0)
Hemoglobin: 12.3 g/dL (ref 12.0–15.0)
MCH: 23.8 pg — AB (ref 26.0–34.0)
MCHC: 29.4 g/dL — AB (ref 30.0–36.0)
MCV: 81.2 fL (ref 78.0–100.0)
Platelets: 249 10*3/uL (ref 150–400)
RBC: 5.16 MIL/uL — ABNORMAL HIGH (ref 3.87–5.11)
RDW: 16.4 % — AB (ref 11.5–15.5)
WBC: 14 10*3/uL — ABNORMAL HIGH (ref 4.0–10.5)

## 2017-04-13 LAB — TROPONIN I: Troponin I: <0.03 ng/mL (ref <0.03)

## 2017-04-13 NOTE — ED Triage Notes (Signed)
Pt states she went camping and got nausea; pt states he had  6 episodes of vomitting; pt states she skipped morning meds and took them tonight instead; pt has hx of CHF; pt states slight left side chest pain at 2/10 on arrival. No obvious distress on arrival. Pt states she had SOB on the ride here and needed O2; Pt a&ox 4 on arrival.

## 2017-04-14 ENCOUNTER — Emergency Department (HOSPITAL_COMMUNITY)
Admission: EM | Admit: 2017-04-14 | Discharge: 2017-04-14 | Disposition: A | Discharge status: Home / Self Care | Payer: 59 | Attending: Emergency Medicine | Admitting: Emergency Medicine

## 2017-04-14 DIAGNOSIS — Z87891 Personal history of nicotine dependence: Secondary | ICD-10-CM | POA: Diagnosis not present

## 2017-04-14 DIAGNOSIS — R079 Chest pain, unspecified: Secondary | ICD-10-CM

## 2017-04-14 DIAGNOSIS — I110 Hypertensive heart disease with heart failure: Secondary | ICD-10-CM | POA: Diagnosis not present

## 2017-04-14 DIAGNOSIS — I5032 Chronic diastolic (congestive) heart failure: Secondary | ICD-10-CM | POA: Diagnosis not present

## 2017-04-14 DIAGNOSIS — Z7982 Long term (current) use of aspirin: Secondary | ICD-10-CM | POA: Diagnosis not present

## 2017-04-14 DIAGNOSIS — Z79899 Other long term (current) drug therapy: Secondary | ICD-10-CM | POA: Diagnosis not present

## 2017-04-14 HISTORY — DX: Heart failure, unspecified: I50.9

## 2017-04-14 LAB — HEPATIC FUNCTION PANEL
ALBUMIN: 3.8 g/dL (ref 3.5–5.0)
ALT: 23 U/L (ref 14–54)
AST: 30 U/L (ref 15–41)
Alkaline Phosphatase: 72 U/L (ref 38–126)
BILIRUBIN DIRECT: 0.4 mg/dL (ref 0.1–0.5)
BILIRUBIN TOTAL: 1.1 mg/dL (ref 0.3–1.2)
Indirect Bilirubin: 0.7 mg/dL (ref 0.3–0.9)
Total Protein: 7 g/dL (ref 6.5–8.1)

## 2017-04-14 LAB — I-STAT TROPONIN, ED: Troponin i, poc: 0 ng/mL (ref 0.00–0.08)

## 2017-04-14 MED ORDER — SODIUM CHLORIDE 0.9 % IV BOLUS (SEPSIS)
1000.0000 mL | Freq: Once | INTRAVENOUS | Status: AC
  Administered 2017-04-14: 1000 mL via INTRAVENOUS

## 2017-04-14 NOTE — ED Provider Notes (Signed)
[facility]-EMERGENCY DEPT Provider Note   CSN: [id number] Arrival date & time: 04/13/17  2225  By signing my name below, I, Sherilynn Knight and Doreatha Martin, attest that this documentation has been prepared under the direction and in the presence of Tomasita Crumble, MD. Electronically Signed: Deland Pretty and Doreatha Martin, ED Scribe. 04/14/17. 3:18 AM.   History   Chief Complaint No chief complaint on file.   The history is provided by the patient. No language interpreter was used.   HPI Comments: Amber Bentley is a 41 y.o. female, with a PMHx of CHF dx 03/14/2017, presents to the Emergency Department complaining of intermittent episodes of vomiting that began last night. Pt attests that she was doing a lot of walking yesterday while camping prior to the onset of her symptoms. She became nauseous after dinner, and walked ~0.5 miles back to the campsite. She realized later that she had taken her evening medication, but did not take her morning medication. Per pt, she took her evening medication but vomited immediately, then proceeded to try her morning medication dose, which was followed by 6 episodes of vomiting in less than an hour. Pt has associated minor central left sided chest pain (2/10) that she describes at "soreness" and SOB. She felt like she  "couldn't get a deep breath." Pt attests that she currently feels normal. She is a former smoker of 1 month. She has no PMHx of MI, PE, or DVT. She currently takes aspirin and Norvasc among other medications. Pt denies diaphoresis, abdominal pain, fevers, coughing, rhinorrhea, congestion and diarrhea.  Past Medical History:  Diagnosis Date  . Anxiety   . CHF (congestive heart failure) (HCC)   . Depression     Patient Active Problem List   Diagnosis Date Noted  . Chronic diastolic CHF (congestive heart failure) (HCC) 03/01/2017  . Pulmonary hypertension (HCC) 03/01/2017  . Obesity, morbid (HCC)   . SOB (shortness of breath)   . Migraine without  status migrainosus, not intractable   . Altered mental state   . Hypertensive cardiomyopathy, 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HCC)   . Congestive heart failure (HCC)   . Pulmonary edema   . 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 HTN (hypertension), benign 02/21/2017  . Adjustment disorder with mixed anxiety and depressed mood 02/21/2017  . 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 Hypertensive emergency 02/21/2017  . Tobacco abuse 02/21/2017  . Chest pain 02/21/2017  . Anxiety and depression 02/21/2017    Past Surgical History:  Procedure Laterality Date  . ABDOMINAL HYSTERECTOMY      OB History    No data available       Home Medications    Prior to Admission medications   Medication Sig Start Date End Date Taking? Authorizing Provider  amLODipine (NORVASC) 2.5 MG tablet Take 1 tablet (2.5 mg total) by mouth daily. 03/14/17 06/12/17  Vesta Mixer, MD  aspirin 81 MG tablet Take 1 tablet (81 mg total) by mouth daily. 02/25/17   Vassie Loll, MD  carvedilol (COREG) 12.5 MG tablet Take 1 tablet (12.5 mg total) by mouth 2 (two) times daily with a meal. 03/22/17   Anne Ng, NP  fluticasone (FLONASE) 50 MCG/ACT nasal spray Place 2 sprays into both nostrils daily. 04/01/17   Anne Ng, NP  lisinopril (PRINIVIL,ZESTRIL) 20 MG tablet Take 1 tablet (20 mg total) by mouth daily. 02/25/17   Vassie Loll, MD  pantoprazole (PROTONIX) 40 MG tablet Take 1 tablet (40  mg total) by mouth 2 (two) times daily. 02/25/17   Vassie Loll, MD  rosuvastatin (CRESTOR) 10 MG tablet Take 1 tablet (10 mg total) by mouth daily. 04/10/17 07/09/17  Vesta Mixer, MD  spironolactone (ALDACTONE) 25 MG tablet Take 1 tablet (25 mg total) by mouth daily. 03/01/17 05/30/17  Vesta Mixer, MD    Family History Family History  Problem Relation Age of Onset  . Hypertension Mother   . Cancer Mother     breast cancer  . Diabetes Father   . Heart disease Father 65   . Hyperlipidemia Father   . Thyroid disease Sister   . Sudden death Brother   . Cancer Maternal Aunt     breast cancer  . Cancer Paternal Grandfather     brain cancer    Social History Social History  Substance Use Topics  . Smoking status: Former Smoker    Packs/day: 0.25    Years: 10.00    Types: Cigarettes  . Smokeless tobacco: Never Used  . Alcohol use Yes     Comment: rarely     Allergies   Food and Aleve [naproxen]   Review of Systems Review of Systems  10 Systems reviewed and are negative for acute change except as noted in the HPI.   Physical Exam Updated Vital Signs BP 125/63   Pulse 65   Temp 97.5 F (36.4 C) (Oral)   Resp (!) 21   SpO2 99%   Physical Exam  Constitutional: She is oriented to person, place, and time. She appears well-developed and well-nourished. No distress.  HENT:  Head: Normocephalic and atraumatic.  Nose: Nose normal.  Mouth/Throat: Oropharynx is clear and moist. No oropharyngeal exudate.  Eyes: Conjunctivae and EOM are normal. Pupils are equal, round, and reactive to light. No scleral icterus.  Neck: Normal range of motion. Neck supple. No JVD present. No tracheal deviation present. No thyromegaly present.  Cardiovascular: Normal rate, regular rhythm and normal heart sounds.  Exam reveals no gallop and no friction rub.   No murmur heard. Pulmonary/Chest: Effort normal and breath sounds normal. No respiratory distress. She has no wheezes. She exhibits no tenderness.  Abdominal: Soft. Bowel sounds are normal. She exhibits no distension and no mass. There is no tenderness. There is no rebound and no guarding.  Musculoskeletal: Normal range of motion. She exhibits no edema or tenderness.  Lymphadenopathy:    She has no cervical adenopathy.  Neurological: She is alert and oriented to person, place, and time. No cranial nerve deficit. She exhibits normal muscle tone.  Skin: Skin is warm and dry. No rash noted. No erythema. No pallor.    Nursing note and vitals reviewed.    ED Treatments / Results  DIAGNOSTIC STUDIES: Oxygen Saturation is 95% on RA, adequate by my interpretation.   COORDINATION OF CARE: 2:50 AM-Discussed next steps with including IV fluids, and blood work for her stomach. Pt and spouse verbalized understanding and is agreeable with the plan.   Labs (all labs ordered are listed, but only abnormal results are displayed) Labs Reviewed  BASIC METABOLIC PANEL - Abnormal; Notable for the following:       Result Value   Chloride 100 (*)    Glucose, Bld 106 (*)    BUN 37 (*)    Creatinine, Ser 1.76 (*)    GFR calc non Af Amer 36 (*)    GFR calc Af Amer 41 (*)    All other components within normal limits  CBC -  Abnormal; Notable for the following:    WBC 14.0 (*)    RBC 5.16 (*)    MCH 23.8 (*)    MCHC 29.4 (*)    RDW 16.4 (*)    All other components within normal limits  TROPONIN I  HEPATIC FUNCTION PANEL  I-STAT TROPOININ, ED    EKG  EKG Interpretation  Date/Time:  Saturday April 13 2017 22:34:12 EDT Ventricular Rate:  66 PR Interval:  172 QRS Duration: 98 QT Interval:  402 QTC Calculation: 421 R Axis:   68 Text Interpretation:  Normal sinus rhythm Normal ECG TWI improved Confirmed by Erroll Luna [id number]) on 04/14/2017 2:48:26 AM       Radiology Dg Chest 2 View  Result Date: 04/13/2017 CLINICAL DATA:  Threw up after taking hypertension medication. Slight chest pain and shortness of breath. History of CHF. EXAM: CHEST  2 VIEW COMPARISON:  Chest radiograph February 24, 2017 FINDINGS: The cardiac silhouette is upper limits of normal in size, decreased from prior imaging. Mediastinal silhouette is unremarkable. Mild bronchitic changes and bandlike density LEFT midlung zone. No pleural effusion or focal consolidation. No pneumothorax. Soft tissue planes and included osseous structures are unremarkable. Large body habitus. IMPRESSION: Borderline cardiomegaly. Mild bronchitic changes  and LEFT midlung zone atelectasis. Electronically Signed   By: Awilda Metro M.D.   On: 04/13/2017 23:35    Procedures Procedures (including critical care time)  Medications Ordered in ED Medications  sodium chloride 0.9 % bolus 1,000 mL (not administered)     Initial Impression / Assessment and Plan / ED Course  I have reviewed the triage vital signs and the nursing notes.  Pertinent labs & imaging results that were available during my care of the patient were reviewed by me and considered in my medical decision making (see chart for details).     Patient has both typical and atypical findings for ACS.  She has a HEART score of 2 for risk factors only.  Will hold in the ED for delta troponin after 3 hours and repeat EKG.  Labs show a nonspecific leukocytosis. She has no history of infectious symptoms.  She was given IVF for mild AKI.  CXR is unremarkable.   Repeat troponin and EKG are unremarkable. She appears well and in NAD.  VS remain within her normal limits and she is safe for DC.  Final Clinical Impressions(s) / ED Diagnoses   Final diagnoses:  None    New Prescriptions New Prescriptions   No medications on file    I personally performed the services described in this documentation, which was scribed in my presence. The recorded information has been reviewed and is accurate.       Tomasita Crumble, MD 04/14/17 [phone]

## 2017-04-19 MED FILL — AMLODIPINE BESYLATE 2.5 MG: 2.5 | 30 days supply | Qty: 30 | Fill #0

## 2017-04-19 MED FILL — ROSUVASTATIN CALCIUM 10 MG: 10 | 30 days supply | Qty: 30 | Fill #0

## 2017-04-26 ENCOUNTER — Other Ambulatory Visit: Payer: Self-pay | Admitting: Nurse Practitioner

## 2017-04-27 ENCOUNTER — Emergency Department (HOSPITAL_COMMUNITY)
Admission: EM | Admit: 2017-04-27 | Discharge: 2017-04-28 | Disposition: A | Discharge status: Home / Self Care | Payer: 59 | Attending: Emergency Medicine | Admitting: Emergency Medicine

## 2017-04-27 ENCOUNTER — Encounter (HOSPITAL_COMMUNITY): Payer: Self-pay | Admitting: Emergency Medicine

## 2017-04-27 ENCOUNTER — Emergency Department (HOSPITAL_COMMUNITY): Payer: 59

## 2017-04-27 DIAGNOSIS — Z5321 Procedure and treatment not carried out due to patient leaving prior to being seen by health care provider: Secondary | ICD-10-CM | POA: Insufficient documentation

## 2017-04-27 DIAGNOSIS — R079 Chest pain, unspecified: Secondary | ICD-10-CM | POA: Diagnosis not present

## 2017-04-27 LAB — CBC
HEMATOCRIT: 36.3 % (ref 36.0–46.0)
HEMOGLOBIN: 10.6 g/dL — AB (ref 12.0–15.0)
MCH: 24.1 pg — ABNORMAL LOW (ref 26.0–34.0)
MCHC: 29.2 g/dL — ABNORMAL LOW (ref 30.0–36.0)
MCV: 82.5 fL (ref 78.0–100.0)
PLATELETS: 189 10*3/uL (ref 150–400)
RBC: 4.4 MIL/uL (ref 3.87–5.11)
RDW: 17.2 % — ABNORMAL HIGH (ref 11.5–15.5)
WBC: 10.1 10*3/uL (ref 4.0–10.5)

## 2017-04-27 LAB — BASIC METABOLIC PANEL
ANION GAP: 8 (ref 5–15)
BUN: 14 mg/dL (ref 6–20)
CO2: 26 mmol/L (ref 22–32)
CREATININE: 1.19 mg/dL — AB (ref 0.44–1.00)
Calcium: 9 mg/dL (ref 8.9–10.3)
Chloride: 102 mmol/L (ref 101–111)
GFR calc non Af Amer: 57 mL/min — ABNORMAL LOW (ref >60)
Glucose, Bld: 105 mg/dL — ABNORMAL HIGH (ref 65–99)
POTASSIUM: 3.9 mmol/L (ref 3.5–5.1)
SODIUM: 136 mmol/L (ref 135–145)

## 2017-04-27 LAB — I-STAT TROPONIN, ED: Troponin i, poc: 0 ng/mL (ref 0.00–0.08)

## 2017-04-27 NOTE — ED Triage Notes (Signed)
Pt presents to ED for sudden onset of left jaw, neck and shoulder pain after having intermittent chest pain since this morning.  Pt with a recent diagnosis of CHF.  Pt denies any other associated symptoms.

## 2017-04-28 DIAGNOSIS — I5032 Chronic diastolic (congestive) heart failure: Secondary | ICD-10-CM | POA: Diagnosis not present

## 2017-04-28 DIAGNOSIS — I159 Secondary hypertension, unspecified: Secondary | ICD-10-CM | POA: Diagnosis not present

## 2017-04-28 DIAGNOSIS — I5041 Acute combined systolic (congestive) and diastolic (congestive) heart failure: Secondary | ICD-10-CM | POA: Diagnosis not present

## 2017-04-28 NOTE — ED Notes (Signed)
Pt stated b/c of the wait she did not want to wait any longer.

## 2017-04-29 ENCOUNTER — Encounter: Payer: Self-pay | Admitting: Cardiovascular Disease

## 2017-04-29 ENCOUNTER — Other Ambulatory Visit: Payer: Self-pay | Admitting: Nurse Practitioner

## 2017-04-29 MED ORDER — CARVEDILOL 12.5 MG PO TABS: 12.5000 mg | ORAL_TABLET | Freq: Two times a day (BID) | ORAL | 3 refills | Status: DC

## 2017-04-29 MED FILL — CARVEDILOL 12.5 MG TABLET: 12.5 | 90 days supply | Qty: 180 | Fill #0

## 2017-04-30 ENCOUNTER — Ambulatory Visit (INDEPENDENT_AMBULATORY_CARE_PROVIDER_SITE_OTHER): Payer: 59 | Admitting: Neurology

## 2017-04-30 ENCOUNTER — Encounter: Payer: Self-pay | Admitting: Neurology

## 2017-04-30 VITALS — BP 148/76 | HR 72 | Resp 16 | Ht 68.0 in | Wt 307.0 lb

## 2017-04-30 DIAGNOSIS — R0681 Apnea, not elsewhere classified: Secondary | ICD-10-CM

## 2017-04-30 DIAGNOSIS — I43 Cardiomyopathy in diseases classified elsewhere: Secondary | ICD-10-CM

## 2017-04-30 DIAGNOSIS — R400 Somnolence: Secondary | ICD-10-CM | POA: Diagnosis not present

## 2017-04-30 DIAGNOSIS — R0683 Snoring: Secondary | ICD-10-CM | POA: Diagnosis not present

## 2017-04-30 DIAGNOSIS — G4734 Idiopathic sleep related nonobstructive alveolar hypoventilation: Secondary | ICD-10-CM

## 2017-04-30 DIAGNOSIS — I110 Hypertensive heart disease with heart failure: Secondary | ICD-10-CM

## 2017-04-30 DIAGNOSIS — G2581 Restless legs syndrome: Secondary | ICD-10-CM

## 2017-04-30 DIAGNOSIS — R600 Localized edema: Secondary | ICD-10-CM | POA: Diagnosis not present

## 2017-04-30 DIAGNOSIS — Z6841 Body Mass Index (BMI) 40.0 and over, adult: Secondary | ICD-10-CM

## 2017-04-30 DIAGNOSIS — I272 Pulmonary hypertension, unspecified: Secondary | ICD-10-CM | POA: Diagnosis not present

## 2017-04-30 NOTE — Progress Notes (Signed)
Subjective:    Patient ID: Amber Bentley is a 41 y.o. female.  HPI     Huston Foley, MD, PhD [facility] [address], [address] [address] [place], [place] [address]  Dear [name],   I saw your patient, Amber Bentley, upon your kind request, in my neurologic clinic today for initial consultation of her sleep disorder, in particular, concern for underlying obstructive sleep apnea. The patient is unaccompanied today. As you know, Ms. Sliwa is a 41 year old right-handed woman with an underlying medical history of hypertension, recent smoking cessation in March 2018, chest pain, hypertensive cardiomyopathy with heart failure, LE edema, pulm hypertension and morbid obesity with a BMI over 45, who reports snoring and excessive daytime somnolence, as well as witnessed apneas, O2 desaturations in sleep, a FHx of OSA in her father, nocturia and AH HAs. I reviewed your office note from 04/01/2017, which you kindly included. She has been followed by cardiology for her CHF.  Of note, she has been to the emergency room recently twice. On 04/14/2017 she presented with several episodes of vomiting. On 04/27/2017 she presented with chest pain. Her Epworth sleepiness score is 19 out of 24, her fatigue score is 47 out of 63. She reports that she has been noted to have breathing pauses while asleep, as witnessed during her hospitalization recently. She was placed on oxygen while in the hospital and still uses oxygen at night.  Her BT is around 10-11 PM and WT is 6:45 for her work and to wake up kids, ages 51 and 22.  Her father had OSA and a CPAP machine, he passed away at age 25. She gained weight in the past 5 years and started losing weight again. She works as a Administrator. She quit smoking in 3/18, drinks alcohol very infrequently, and about 20 oz of coffee per day, maybe less.   Her Past Medical History Is Significant For: Past Medical History:  Diagnosis Date  . Anxiety   . CHF  (congestive heart failure) (HCC)   . Depression     Her Past Surgical History Is Significant For: Past Surgical History:  Procedure Laterality Date  . ABDOMINAL HYSTERECTOMY      Her Family History Is Significant For: Family History  Problem Relation Age of Onset  . Hypertension Mother   . Cancer Mother        breast cancer  . Diabetes Father   . Heart disease Father 62  . Hyperlipidemia Father   . Hypertension Father   . Depression Father   . Thyroid disease Sister   . Sudden death Brother   . Cancer Maternal Aunt        breast cancer  . Cancer Paternal Grandfather        brain cancer    Her Social History Is Significant For: Social History   Social History  . Marital status: Married    Spouse name: N/A  . Number of children: 2  . Years of education: MA   Occupational History  . [employer]    Social History Main Topics  . Smoking status: Former Smoker    Packs/day: 0.25    Years: 10.00    Types: Cigarettes  . Smokeless tobacco: Never Used  . Alcohol use Yes     Comment: rarely  . Drug use: No  . Sexual activity: Not Asked   Other Topics Concern  . None   Social History Narrative   Drinks coffee daily  Her Allergies Are:  Allergies  Allergen Reactions  . 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   [name] [Naproxen] Other (See Comments)    Pt states that this medication makes her loopy.   :   Her Current Medications Are:  Outpatient Encounter Prescriptions as of 04/30/2017  Medication Sig  . amLODipine (NORVASC) 2.5 MG tablet Take 1 tablet (2.5 mg total) by mouth daily.  [name] aspirin 81 MG tablet Take 1 tablet (81 mg total) by mouth daily.  . carvedilol (COREG) 12.5 MG tablet Take 1 tablet (12.5 mg total) by mouth 2 (two) times daily with a meal.  . carvedilol (COREG) 12.5 MG tablet TAKE 1 TABLET BY MOUTH TWICE DAILY WITH MEALS  . fluticasone (FLONASE) 50 MCG/ACT nasal spray Place 2 sprays into both  nostrils daily.  [name] lisinopril (PRINIVIL,ZESTRIL) 20 MG tablet Take 1 tablet (20 mg total) by mouth daily.  . pantoprazole (PROTONIX) 40 MG tablet Take 1 tablet (40 mg total) by mouth 2 (two) times daily.  . rosuvastatin (CRESTOR) 10 MG tablet Take 1 tablet (10 mg total) by mouth daily.  [name] spironolactone (ALDACTONE) 25 MG tablet Take 1 tablet (25 mg total) by mouth daily.   No facility-administered encounter medications on file as of 04/30/2017.   :  Review of Systems:  Out of a complete 14 point review of systems, all are reviewed and negative with the exception of these symptoms as listed below:  Review of Systems  Neurological:       Patient has trouble falling asleep and staying asleep without oxygen, snores, witnessed apnea, wakes up feeling tired, daytime fatigue, wakes up with headaches, takes a nap if she can.   Epworth Sleepiness Scale 0= would never doze 1= slight chance of dozing 2= moderate chance of dozing 3= high chance of dozing  Sitting and reading:3 Watching TV:2 Sitting inactive in a public place (ex. Theater or meeting):2 As a passenger in a car for an hour without a break:3 Lying down to rest in the afternoon:3 Sitting and talking to someone:1 Sitting quietly after lunch (no alcohol):3 In a car, while stopped in traffic:2 Total:19   Objective:  Neurologic Exam  Physical Exam Physical Examination:   Vitals:   04/30/17 1558  BP: (!) 148/76  Pulse: 72  Resp: 16    General Examination: The patient is a very pleasant 41 y.o. female in no acute distress. She appears well-developed and well-nourished and well groomed.   HEENT: Normocephalic, atraumatic, pupils are equal, round and reactive to light and accommodation. Funduscopic exam is normal with sharp disc margins noted. Extraocular tracking is good without limitation to gaze excursion or nystagmus noted. Normal smooth pursuit is noted. Hearing is grossly intact. Face is symmetric with normal facial  animation and normal facial sensation. Speech is clear with no dysarthria noted. There is no hypophonia. There is no lip, neck/head, jaw or voice tremor. Neck is supple with full range of passive and active motion. There are no carotid bruits on auscultation. Oropharynx exam reveals: mild mouth dryness, adequate dental hygiene and mild airway crowding, due to smaller airway entry and tonsils in place. Mallampati is class I. Tongue protrudes centrally and palate elevates symmetrically. Tonsils are 1+ in size. Neck size is 16.25 inches. She has a Mild overbite. Nasal inspection reveals no significant nasal mucosal bogginess or redness and no septal deviation.   Chest: Clear to auscultation without wheezing, rhonchi or crackles noted.  Heart:  S1+S2+0, regular and normal without murmurs, rubs or gallops noted.   Abdomen: Soft, non-tender and non-distended with normal bowel sounds appreciated on auscultation.  Extremities: There is 1+ pitting edema in the distal lower extremities bilaterally. Pedal pulses are intact.  Skin: Warm and dry without trophic changes noted.  Musculoskeletal: exam reveals no obvious joint deformities, tenderness or joint swelling or erythema.   Neurologically:  Mental status: The patient is awake, alert and oriented in all 4 spheres. Her immediate and remote memory, attention, language skills and fund of knowledge are appropriate. There is no evidence of aphasia, agnosia, apraxia or anomia. Speech is clear with normal prosody and enunciation. Thought process is linear. Mood is normal and affect is normal.  Cranial nerves II - XII are as described above under HEENT exam. In addition: shoulder shrug is normal with equal shoulder height noted. Motor exam: Normal bulk, strength and tone is noted. There is no drift, tremor or rebound. Romberg is negative. Reflexes are 1+ throughout. Fine motor skills and coordination: intact with normal finger taps, normal hand movements, normal  rapid alternating patting, normal foot taps and normal foot agility.  Cerebellar testing: No dysmetria or intention tremor on finger to nose testing. Heel to shin is somewhat difficult d/t body habitus. There is no truncal or gait ataxia.  Sensory exam: intact to light touch in the upper and lower extremities.  Gait, station and balance: She stands easily. No veering to one side is noted. No leaning to one side is noted. Posture is age-appropriate and stance is narrow based. Gait shows normal stride length and normal pace. No problems turning are noted. Tandem walk is unremarkable.       Assessment and Plan:  In summary, Courtlynn Holloman is a very pleasant 41 y.o.-year old female with an underlying medical history of hypertension, recent smoking cessation in March 2018, chest pain, hypertensive cardiomyopathy with heart failure, LE edema, pulm hypertension and morbid obesity with a BMI over 45, whose history and physical exam are concerning for obstructive sleep apnea (OSA). I had a long chat with the patient and [name] about my findings and the diagnosis of OSA, its prognosis and treatment options. We talked about medical treatments, surgical interventions and non-pharmacological approaches. I explained in particular the risks and ramifications of untreated moderate to severe OSA, especially with respect to developing cardiovascular disease down the Road, including congestive heart failure, difficult to treat hypertension, cardiac arrhythmias, or stroke. Even type 2 diabetes has, in part, been linked to untreated OSA. Symptoms of untreated OSA include daytime sleepiness, memory problems, mood irritability and mood disorder such as depression and anxiety, lack of energy, as well as recurrent headaches, especially morning headaches. We talked about trying to maintain a healthy lifestyle in general, as well as the importance of weight control. I encouraged the patient to eat healthy, exercise daily and keep well  hydrated, to keep a scheduled bedtime and wake time routine, to not skip any meals and eat healthy snacks in between meals. I advised the patient not to drive when feeling sleepy. I recommended the following at this time: sleep study with potential positive airway pressure titration. (We will score hypopneas at 3%).   I explained the sleep test procedure to the patient and also outlined possible surgical and non-surgical treatment options of OSA, including the use of a custom-made dental device (which would require a referral to a specialist dentist or oral surgeon), upper airway surgical options, such as pillar implants, radiofrequency surgery, tongue  base surgery, and UPPP (which would involve a referral to an ENT surgeon). Rarely, jaw surgery such as mandibular advancement may be considered.  I also explained the CPAP treatment option to the patient, who indicated that she would be willing to try CPAP if the need arises. I explained the importance of being compliant with PAP treatment, not only for insurance purposes but primarily to improve Her symptoms, and for the patient's long term health benefit, including to reduce Her cardiovascular risks. I answered all their questions today and the patient and [name] were in agreement. I would like to see her back after the sleep study is completed and encouraged her to call with any interim questions, concerns, problems or updates.   Thank you very much for allowing me to participate in the care of this nice patient. If I can be of any further assistance to you please do not hesitate to call me at [phone].  Sincerely,   Huston Foley, MD, PhD

## 2017-05-01 ENCOUNTER — Telehealth: Payer: Self-pay | Admitting: Nurse Practitioner

## 2017-05-01 NOTE — Telephone Encounter (Signed)
Called patient regarding request for aspirin refill. I advised her that per Dr. Elease Hashimoto, she can d/c aspirin due to her recent GI symptoms. She thanked me for the call.

## 2017-05-14 ENCOUNTER — Encounter: Payer: Self-pay | Admitting: Registered"

## 2017-05-14 ENCOUNTER — Encounter: Payer: 59 | Attending: Nurse Practitioner | Admitting: Registered"

## 2017-05-14 DIAGNOSIS — I119 Hypertensive heart disease without heart failure: Secondary | ICD-10-CM | POA: Diagnosis not present

## 2017-05-14 DIAGNOSIS — Z713 Dietary counseling and surveillance: Secondary | ICD-10-CM | POA: Insufficient documentation

## 2017-05-14 NOTE — Progress Notes (Signed)
Medical Nutrition Therapy:  Appt start time: 1410 end time:  1520.  Assessment:  Primary concerns today: Pt states her health has been going down hill for the last 5 years, but she hasn't sought medical care because she did not have insurance. In March she was able to see a doctor and found out she had heart failure. Pt states her doctor wants her to lose weight. Pt states her doctor has cleared her to exercise 4x week. Pt also in concerned because she has a family history of diabetes and believes she was diagnosed as pre-diabetic10 yrs ago. Pt states she has been obese since she was 41 yrs old.  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  Sleep: Pt states she has terrible sleep quality and has a referral sleep study, to check for sleep apnea.  Stopped smoking in March  Exercise: Since April when doctor approved. Did not exercise much before then.  Preferred Learning Style:   No preference indicated   Learning Readiness:   Contemplating  MEDICATIONS: reviewed   DIETARY INTAKE:  Usual eating pattern includes 1-3 meals and 1-2 snacks per day.  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 None OR oatmeal w/ raisins Snk ( AM): none  L ( PM): none OR protein, veg, fruit Snk ( PM): none OR cereal OR 1/2 sandwich D ( PM): casserole, soups, chili, potatoes Snk ( PM): none Beverages: coffee, water, almond milk, occasionally sweet tea, OJ  Usual physical activity: walks 1 mile 5 days week, occasionally swimming   Estimated energy needs: 1800 calories 200 g carbohydrates 135 g protein 50 g fat  Progress Towards Goal(s):  In progress.   Nutritional Diagnosis:  NI-5.8.5 Inadeqate fiber intake As related to lack of vegetables and whole grains in diet.  As evidenced by diet recall.    Intervention:  Nutrition Education. Discussed food groups and balanced eating.  Discussed importance of sleep and exercise in maintaining health as well as effect on food choices and weight maintenance. Discussed importance of spreading out meals throughout the day.  Ideas for better sleep:  Get a bedtime routine to help wind down  Avoid caffeine later in the day including coffee, soda, black tea, green tea, chocolate   Consider Melatonin Extended Released supplement  Magnesium supplement in the evening may help- Calm anti-stress drink, take minimal amount to begin, can also help with constipation  Avoid watching TV or looking at the computer 1 hr before going to sleep  Optimal room temperature for sleep 60-67 degrees fahrenheit  Avoid light in bedroom while sleeping, room darkening curtains may help  More information can be found at https://sleepfoundation.org/bedroom/see.php  Make and effort to make breakfast to take to work - eggs, fruit or oatmeal Consider taking left overs for lunch  Fiber: Check ingredients on breads, pastas, grains for whole grain as the first ingredient. Have 1 or 2 handfuls of nuts each day (almonds). Aim for half you plate to be vegetables, Consider eating more oatmeal. Include beans on a regular basis  Eat more unsaturated fats, less of saturated fat.  Teaching Method Utilized:  Visual Auditory  Handouts given during visit include:  My Plate Planner  Types of Fat  Triglyceride and Cholesterol  Barriers to learning/adherence to lifestyle change: none  Demonstrated degree of understanding via:  Teach Back   Monitoring/Evaluation:  Dietary intake, exercise, and  body weight prn.

## 2017-05-14 NOTE — Patient Instructions (Addendum)
Ideas for better sleep:  Get a bedtime routine to help wind down  Avoid caffeine later in the day including coffee, soda, black tea, green tea, chocolate   Consider Melatonin Extended Released supplement  Magnesium supplement in the evening may help- Calm anti-stress drink, take minimal amount to begin, can also help with constipation  Avoid watching TV or looking at the computer 1 hr before going to sleep  Optimal room temperature for sleep 60-67 degrees fahrenheit  Avoid light in bedroom while sleeping, room darkening curtains may help  More information can be found at https://sleepfoundation.org/bedroom/see.php  Make and effort to make breakfast to take to work - eggs, fruit or oatmeal Consider taking left overs for lunch  Fiber: Check ingredients on breads, pastas, grains for whole grain as the first ingredient. Have 1 or 2 handfuls of nuts each day (almonds). Aim for half you plate to be vegetables, Consider eating more oatmeal. Include beans on a regular basis  Eat more unsaturated fats, less of saturated fat.

## 2017-05-21 ENCOUNTER — Telehealth: Payer: Self-pay | Admitting: Nurse Practitioner

## 2017-05-21 DIAGNOSIS — I10 Essential (primary) hypertension: Secondary | ICD-10-CM

## 2017-05-21 MED ORDER — LISINOPRIL 20 MG PO TABS: 20.0000 mg | ORAL_TABLET | Freq: Every day | ORAL | 1 refills | Status: DC

## 2017-05-21 MED ORDER — PANTOPRAZOLE SODIUM 40 MG PO TBEC: 40.0000 mg | DELAYED_RELEASE_TABLET | Freq: Two times a day (BID) | ORAL | 1 refills | Status: DC

## 2017-05-21 NOTE — Telephone Encounter (Signed)
rx sent

## 2017-05-24 ENCOUNTER — Ambulatory Visit (INDEPENDENT_AMBULATORY_CARE_PROVIDER_SITE_OTHER): Payer: 59 | Admitting: Neurology

## 2017-05-24 DIAGNOSIS — G4720 Circadian rhythm sleep disorder, unspecified type: Secondary | ICD-10-CM

## 2017-05-24 DIAGNOSIS — G4733 Obstructive sleep apnea (adult) (pediatric): Secondary | ICD-10-CM | POA: Diagnosis not present

## 2017-05-27 NOTE — Procedures (Signed)
PATIENT'S NAME:  Amber Bentley, Amber Bentley DOB:      13-Aug-1976      MR#:    [id number]     DATE OF RECORDING: 05/24/2017 REFERRING M.D.:  Anne Ng, NP Study Performed:   Baseline Polysomnogram HISTORY: 41 year old woman with a history of hypertension, recent smoking cessation in March 2018, chest pain, hypertensive cardiomyopathy with heart failure, LE edema, pulm hypertension and morbid obesity with a BMI over 45, who reports snoring and excessive daytime somnolence, as well as witnessed apneas, O2 desaturations in sleep, a FHx of OSA in her father, nocturia and AH HAs. The patient endorsed the Epworth Sleepiness Scale at 19 points. The patient's weight 306 pounds with a height of 68 (inches), resulting in a BMI of 46.4 kg/m2. The patient's neck circumference measured 16.25 inches.  CURRENT MEDICATIONS: Norvasc, Aspirin, Coreg, Floanse, Lisinopril, Protonix, Crestor, Aldactone   PROCEDURE:  This is a multichannel digital polysomnogram utilizing the Somnostar 11.2 system.  Electrodes and sensors were applied and monitored per AASM Specifications.   EEG, EOG, Chin and Limb EMG, were sampled at 200 Hz.  ECG, Snore and Nasal Pressure, Thermal Airflow, Respiratory Effort, CPAP Flow and Pressure, Oximetry was sampled at 50 Hz. Digital video and audio were recorded.      BASELINE STUDY  Lights Out was at 22:58 and Lights On at 05:01.  Total recording time (TRT) was 363 minutes, with a total sleep time (TST) of  343 minutes.   The patient's sleep latency was 10 minutes, which is normal.  REM latency was 82.5 minutes, which is normal.  The sleep efficiency was 94.5 %.     SLEEP ARCHITECTURE: WASO (Wake after sleep onset) was 9.5 minutes with minimal to mild sleep fragmentation noted.  There were 21 minutes in Stage N1, 149.5 minutes Stage N2, 110 minutes Stage N3 and 62.5 minutes in Stage REM.  The percentage of Stage N1 was 6.1%, Stage N2 was 43.6%, Stage N3 was 32.1%, which is increased, and Stage R (REM  sleep) was 18.2%, which is near normal.  The arousals were noted as: 43 were spontaneous, 0 were associated with PLMs, 2 were associated with respiratory events.    Audio and video analysis did not show any abnormal or unusual movements, behaviors, phonations or vocalizations.  The patient took no bathroom breaks. Mild snoring was noted. The EKG was in keeping with normal sinus rhythm (NSR).  RESPIRATORY ANALYSIS:  There were a total of 55 respiratory events:  0 obstructive apneas, 0 central apneas and 0 mixed apneas with a total of 0 apneas and an apnea index (AI) of 0 /hour. There were 55 hypopneas with a hypopnea index of 9.6 /hour. The patient also had 0 respiratory event related arousals (RERAs).      The total APNEA/HYPOPNEA INDEX (AHI) was 9.6/hour and the total RESPIRATORY DISTURBANCE INDEX was 9.6 /hour.  51 events occurred in REM sleep and 8 events in NREM. The REM AHI was 49. /hour, versus a non-REM AHI of .9. The patient spent 84.5 minutes of total sleep time in the supine position and 259 minutes in non-supine.. The supine AHI was 9.9 versus a non-supine AHI of 9.5.  OXYGEN SATURATION & C02:  The Wake baseline 02 saturation was 97%, with the lowest being 69%. Time spent below 89% saturation equaled 54 minutes.   PERIODIC LIMB MOVEMENTS:  The patient had a total of 0 Periodic Limb Movements.  The Periodic Limb Movement (PLM) index was 0 and the PLM Arousal index  was 0/hour.    Post-study, the patient indicated that sleep was the same as usual.   IMPRESSION:  1. Obstructive Sleep Apnea (OSA) 2. Dysfunctions associated with sleep stages or arousal from sleep  RECOMMENDATIONS:  1. This study demonstrates overall mild obstructive sleep apnea, severe in REM sleep with a total AHI of 9.6/hour, REM AHI of 49/hour, and O2 nadir of 69%. Given the patient's medical history and sleep related complaints, as well as severe REM related desaturations, a full-night CPAP titration study is  recommended to optimize therapy. Other treatment options may include avoidance of supine sleep position along with weight loss, upper airway or jaw surgery in selected patients or the use of an oral appliance in certain patients. ENT evaluation and/or consultation with a maxillofacial surgeon or dentist may be feasible in some instances.    2. Please note that untreated obstructive sleep apnea carries additional perioperative morbidity. Patients with significant obstructive sleep apnea should receive perioperative PAP therapy and the surgeons and particularly the anesthesiologist should be informed of the diagnosis and the severity of the sleep disordered breathing. 3. This study shows sleep fragmentation and abnormal sleep stage percentages; these are nonspecific findings and per se do not signify an intrinsic sleep disorder or a cause for the patient's sleep-related symptoms. Causes include (but are not limited to) the first night effect of the sleep study, circadian rhythm disturbances, medication effect or an underlying mood disorder or medical problem.  4. The patient should be cautioned not to drive, work at heights, or operate dangerous or heavy equipment when tired or sleepy. Review and reiteration of good sleep hygiene measures should be pursued with any patient. 5. The patient will be seen in follow-up by Dr. Frances Furbish at [facility] for discussion of the test results and further management strategies. The referring provider will be notified of the test results.  I certify that I have reviewed the entire raw data recording prior to the issuance of this report in accordance with the Standards of Accreditation of the American Academy of Sleep Medicine (AASM)  Huston Foley, MD, PhD Diplomat, American Board of Psychiatry and Neurology (Neurology and Sleep Medicine)

## 2017-05-27 NOTE — Addendum Note (Signed)
Addended by: Huston Foley on: 05/27/2017 08:12 AM   Modules accepted: Orders

## 2017-05-27 NOTE — Progress Notes (Signed)
Patient referred by Alysia Penna, seen by me on 04/30/17, diagnostic PSG on 05/24/17.    Please call and notify the patient that the recent sleep study did confirm the diagnosis of mild to severe obstructive sleep apnea with severe REM sleep related OSA and severe desaturations as low as 69% in REM sleep, and that I recommend treatment for this in the form of CPAP. This will require a repeat sleep study for proper titration and mask fitting. Please explain to patient and arrange for a CPAP titration study. I have placed an order in the chart. Thanks, and please route to [facility] for scheduling next sleep study.  Huston Foley, MD, PhD [facility])

## 2017-05-29 ENCOUNTER — Telehealth: Payer: Self-pay | Admitting: *Deleted

## 2017-05-29 DIAGNOSIS — I5041 Acute combined systolic (congestive) and diastolic (congestive) heart failure: Secondary | ICD-10-CM | POA: Diagnosis not present

## 2017-05-29 DIAGNOSIS — I5032 Chronic diastolic (congestive) heart failure: Secondary | ICD-10-CM | POA: Diagnosis not present

## 2017-05-29 DIAGNOSIS — I159 Secondary hypertension, unspecified: Secondary | ICD-10-CM | POA: Diagnosis not present

## 2017-05-29 NOTE — Telephone Encounter (Signed)
-----   Message from Huston Foley, MD sent at 05/27/2017  8:12 AM EDT ----- Patient referred by Alysia Penna, seen by me on 04/30/17, diagnostic PSG on 05/24/17.    Please call and notify the patient that the recent sleep study did confirm the diagnosis of mild to severe obstructive sleep apnea with severe REM sleep related OSA and severe desaturations as low as 69% in REM sleep, and that I recommend treatment for this in the form of CPAP. This will require a repeat sleep study for proper titration and mask fitting. Please explain to patient and arrange for a CPAP titration study. I have placed an order in the chart. Thanks, and please route to [facility] for scheduling next sleep study.  Huston Foley, MD, PhD [facility])

## 2017-05-29 NOTE — Telephone Encounter (Signed)
LMTC./fim 

## 2017-05-30 NOTE — Telephone Encounter (Signed)
I have spoken with [name] this morning, and per SA, reviewed PSG results.  She verbalized understanding of same, is agreeable to CPAP titration study.Amber Bentley

## 2017-06-03 MED FILL — ROSUVASTATIN CALCIUM 10 MG: 10 | 30 days supply | Qty: 30 | Fill #1

## 2017-06-03 MED FILL — AMLODIPINE BESYLATE 2.5 MG: 2.5 | 30 days supply | Qty: 30 | Fill #1

## 2017-06-04 MED FILL — PANTOPRAZOLE SOD DR 40 MG T: 40 | 90 days supply | Qty: 180 | Fill #0

## 2017-06-04 MED FILL — LISINOPRIL 20 MG TAB: 20 | 90 days supply | Qty: 90 | Fill #0

## 2017-06-08 DIAGNOSIS — H5213 Myopia, bilateral: Secondary | ICD-10-CM | POA: Diagnosis not present

## 2017-06-14 ENCOUNTER — Ambulatory Visit (INDEPENDENT_AMBULATORY_CARE_PROVIDER_SITE_OTHER): Payer: 59 | Admitting: Neurology

## 2017-06-14 DIAGNOSIS — G4733 Obstructive sleep apnea (adult) (pediatric): Secondary | ICD-10-CM

## 2017-06-14 DIAGNOSIS — G4720 Circadian rhythm sleep disorder, unspecified type: Secondary | ICD-10-CM

## 2017-06-18 ENCOUNTER — Telehealth: Payer: Self-pay | Admitting: Neurology

## 2017-06-18 NOTE — Addendum Note (Signed)
Addended by: Huston Foley on: 06/18/2017 07:10 PM   Modules accepted: Orders

## 2017-06-18 NOTE — Procedures (Signed)
PATIENT'S NAME:  Amber Bentley, Amber Bentley DOB:      September 18, 1976      MR#:    [id number]     DATE OF RECORDING: 06/14/2017 REFERRING M.D.:  Anne Ng, NP Study Performed:   CPAP  Titration HISTORY: 41 year old right-handed woman with an underlying medical history of hypertension, recent smoking cessation in March 2018, chest pain, hypertensive cardiomyopathy with heart failure, LE edema, pulm hypertension and morbid obesity with a BMI over 45, who returns for a full night CPAP titration. Her  PSG performed on 05/24/2017 with AHI of 9.6 and low spo2 of 69%. The patient endorsed the Epworth Sleepiness Scale at 19 points. The patient's weight 306 pounds with a height of 68 (inches), resulting in a BMI of 46.4 kg/m2. The patient's neck circumference measured 16 inches.  CURRENT MEDICATIONS: Norvasc, Aspirin, Coreg, Floanse, Lisinopril, Protonix, Crestor, Aldactone  PROCEDURE:  This is a multichannel digital polysomnogram utilizing the SomnoStar 11.2 system.  Electrodes and sensors were applied and monitored per AASM Specifications.   EEG, EOG, Chin and Limb EMG, were sampled at 200 Hz.  ECG, Snore and Nasal Pressure, Thermal Airflow, Respiratory Effort, CPAP Flow and Pressure, Oximetry was sampled at 50 Hz. Digital video and audio were recorded.      The patient was fitted with small nasal pillows. CPAP was initiated at 5 cmH20 with heated humidity per AASM standards and pressure was advanced to 9 cmH20 because of hypopneas, apneas and desaturations.  At a PAP pressure of 9 cmH20, there was a reduction of the AHI to 0/hour, with O2 nadir of 86% and non-supine REM sleep achieved.     Lights Out was at 23:00 and Lights On at 05:08. Total recording time (TRT) was 368.5 minutes, with a total sleep time (TST) of 315 minutes. The patient's sleep latency was 11 minutes with 0.5 minutes of wake time after sleep onset. REM latency was 53 minutes.  The sleep efficiency was 85.5 %.    SLEEP ARCHITECTURE: WASO (Wake after  sleep onset)  was 44.5 minutes with mild to moderate sleep fragmentation noted. There were 32 minutes in Stage N1, 136.5 minutes Stage N2, 78.5 minutes Stage N3 and 68 minutes in Stage REM.  The percentage of Stage N1 was 10.2%, which is increased, Stage N2 was 43.3%, Stage N3 was 24.9% and Stage R (REM sleep) was 21.6%, which was normal.  The arousals were noted as: 41 were spontaneous, 11 were associated with PLMs, 2 were associated with respiratory events.  Audio and video analysis did not show any abnormal or unusual movements, behaviors, phonations or vocalizations.  The patient took 1 bathroom break. The EKG was in keeping with normal sinus rhythm (NSR).  RESPIRATORY ANALYSIS:  There was a total of 16 respiratory events: 0 obstructive apneas, 0 central apneas and 0 mixed apneas with a total of 0 apneas and an apnea index (AI) of 0 /hour. There were 16 hypopneas with a hypopnea index of 3./hour. The patient also had 0 respiratory event related arousals (RERAs).      The total APNEA/HYPOPNEA INDEX  (AHI) was 3. /hour and the total RESPIRATORY DISTURBANCE INDEX was 3. .hour  12 events occurred in REM sleep and 4 events in NREM. The REM AHI was 10.6 /hour versus a non-REM AHI of 1. /hour.  The patient spent 183 minutes of total sleep time in the supine position and 132 minutes in non-supine. The supine AHI was 5.2, versus a non-supine AHI of 0.0.  OXYGEN SATURATION &  C02:  The baseline 02 saturation was 94%, with the lowest being 83%. Time spent below 89% saturation equaled 14 minutes.  PERIODIC LIMB MOVEMENTS:  The patient had a total of 22 Periodic Limb Movements. The Periodic Limb Movement (PLM) index was 4.2 and the PLM Arousal index was 2.1 /hour.  Post-study, the patient indicated that sleep was better than usual.   IMPRESSION: 1. Obstructive Sleep Apnea (OSA) 2. Dysfunctions associated with sleep stages or arousal from sleep   RECOMMENDATIONS:   1. This study demonstrates resolution of  the patient's obstructive sleep apnea with CPAP therapy. Due to only non-supine REM sleep achieved on the final pressure of 9 cm and O2 nadir of 86% (albeit in the absence of a respiratory event) I will start the patient on home CPAP treatment at a pressure of 10 cm via small nasal pillows with heated humidity. The patient should be reminded to be fully compliant with PAP therapy to improve sleep related symptoms and decrease long term cardiovascular risks. The patient should be reminded, that it may take up to 3 months to get fully used to using PAP with all planned sleep. The earlier full compliance is achieved, the better long term compliance tends to be. Please note that untreated obstructive sleep apnea carries additional perioperative morbidity. Patients with significant obstructive sleep apnea should receive perioperative PAP therapy and the surgeons and particularly the anesthesiologist should be informed of the diagnosis and the severity of the sleep disordered breathing. 2. This study shows sleep fragmentation and abnormal sleep stage percentages; these are nonspecific findings and per se do not signify an intrinsic sleep disorder or a cause for the patient's sleep-related symptoms. Causes include (but are not limited to) the first night effect of the sleep study, circadian rhythm disturbances, medication effect or an underlying mood disorder or medical problem.  3. The patient should be cautioned not to drive, work at heights, or operate dangerous or heavy equipment when tired or sleepy. Review and reiteration of good sleep hygiene measures should be pursued with any patient. 4. The patient will be seen in follow-up by Dr. Frances Furbish at [facility] for discussion of the test results and further management strategies. The referring provider will be notified of the test results.   I certify that I have reviewed the entire raw data recording prior to the issuance of this report in accordance with the Standards of  Accreditation of the American Academy of Sleep Medicine (AASM)   Huston Foley, MD, PhD Diplomat, American Board of Psychiatry and Neurology (Neurology and Sleep Medicine)

## 2017-06-18 NOTE — Telephone Encounter (Signed)
Pt is going out of town for 2 weeks leaving on 7/8, wanting to get sleep results and to get CPAP prior to leaving. Pt is aware it can take up to 14 days for test results

## 2017-06-18 NOTE — Progress Notes (Signed)
Patient referred by Alysia Penna, seen by me on 04/30/17, diagnostic PSG on 05/24/17, CPAP study on 06/14/17.    Please call and inform patient that I have entered an order for treatment with positive airway pressure (PAP) treatment of obstructive sleep apnea (OSA). She did well during the latest sleep study with CPAP. We will, therefore, arrange for a machine for home use through a DME (durable medical equipment) company of Her choice; and I will see the patient back in follow-up in about 10 weeks; she can see MM or CM if needed. Please also explain to the patient that I will be looking out for compliance data, which can be downloaded from the machine (stored on an SD card, that is inserted in the machine) or via remote access through a modem, that is built into the machine. At the time of the followup appointment we will discuss sleep study results and how it is going with PAP treatment at home. Please advise patient to bring Her machine at the time of the first FU visit, even though this is cumbersome. Bringing the machine for every visit after that will likely not be needed, but often helps for the first visit to troubleshoot if needed. Please re-enforce the importance of compliance with treatment and the need for [name] to monitor compliance data - often an insurance requirement and actually good feedback for the patient as far as how they are doing.  Also remind patient, that any interim PAP machine or mask issues should be first addressed with the DME company, as they can often help better with technical and mask fit issues. Please ask if patient has a preference regarding DME company.  Please also make sure, the patient has a follow-up appointment with me or Aundra Millet or Eber Jones in about 10 weeks from the setup date, thanks.  Once you have spoken to the patient - and faxed/routed report to PCP and referring MD (if other than PCP), you can close this encounter, thanks,   Huston Foley, MD, PhD [facility] ([facility])

## 2017-06-20 ENCOUNTER — Other Ambulatory Visit (INDEPENDENT_AMBULATORY_CARE_PROVIDER_SITE_OTHER): Payer: 59

## 2017-06-20 ENCOUNTER — Ambulatory Visit (INDEPENDENT_AMBULATORY_CARE_PROVIDER_SITE_OTHER): Payer: 59 | Admitting: Nurse Practitioner

## 2017-06-20 ENCOUNTER — Encounter: Payer: Self-pay | Admitting: Nurse Practitioner

## 2017-06-20 ENCOUNTER — Telehealth: Payer: Self-pay

## 2017-06-20 VITALS — BP 150/100 | HR 68 | Temp 98.6°F | Ht 68.0 in | Wt 323.0 lb

## 2017-06-20 DIAGNOSIS — I5041 Acute combined systolic (congestive) and diastolic (congestive) heart failure: Secondary | ICD-10-CM

## 2017-06-20 DIAGNOSIS — D509 Iron deficiency anemia, unspecified: Secondary | ICD-10-CM | POA: Diagnosis not present

## 2017-06-20 DIAGNOSIS — I10 Essential (primary) hypertension: Secondary | ICD-10-CM

## 2017-06-20 DIAGNOSIS — Z0001 Encounter for general adult medical examination with abnormal findings: Secondary | ICD-10-CM | POA: Diagnosis not present

## 2017-06-20 DIAGNOSIS — R5383 Other fatigue: Secondary | ICD-10-CM

## 2017-06-20 DIAGNOSIS — G43009 Migraine without aura, not intractable, without status migrainosus: Secondary | ICD-10-CM

## 2017-06-20 LAB — CBC
HEMATOCRIT: 41.9 % (ref 36.0–46.0)
HEMOGLOBIN: 13.7 g/dL (ref 12.0–15.0)
MCHC: 32.7 g/dL (ref 30.0–36.0)
MCV: 83.2 fl (ref 78.0–100.0)
PLATELETS: 224 10*3/uL (ref 150.0–400.0)
RBC: 5.04 Mil/uL (ref 3.87–5.11)
RDW: 14.6 % (ref 11.5–15.5)
WBC: 9.6 10*3/uL (ref 4.0–10.5)

## 2017-06-20 LAB — T4, FREE: FREE T4: 0.61 ng/dL (ref 0.60–1.60)

## 2017-06-20 LAB — BASIC METABOLIC PANEL
BUN: 23 mg/dL (ref 6–23)
CALCIUM: 9.3 mg/dL (ref 8.4–10.5)
CHLORIDE: 101 meq/L (ref 96–112)
CO2: 28 meq/L (ref 19–32)
CREATININE: 1.19 mg/dL (ref 0.40–1.20)
GFR: 53.77 mL/min — ABNORMAL LOW (ref >60.00)
GLUCOSE: 113 mg/dL — AB (ref 70–99)
Potassium: 4.7 mEq/L (ref 3.5–5.1)
Sodium: 140 mEq/L (ref 135–145)

## 2017-06-20 LAB — TSH: TSH: 3.01 u[IU]/mL (ref 0.35–4.50)

## 2017-06-20 LAB — IRON AND TIBC
%SAT: 25 % (ref 11–50)
Iron: 85 ug/dL (ref 40–190)
TIBC: 344 ug/dL (ref 250–450)
UIBC: 259 ug/dL

## 2017-06-20 LAB — BRAIN NATRIURETIC PEPTIDE: PRO B NATRI PEPTIDE: 37 pg/mL (ref 0.0–100.0)

## 2017-06-20 LAB — FERRITIN: Ferritin: 55.4 ng/mL (ref 10.0–291.0)

## 2017-06-20 MED ORDER — BUTALBITAL-APAP-CAFFEINE 50-325-40 MG PO TABS: 1.0000 | ORAL_TABLET | Freq: Four times a day (QID) | ORAL | 0 refills | Status: DC | PRN

## 2017-06-20 NOTE — Telephone Encounter (Signed)
Pt calling asking for a call back re: if the results are available re: CPAP before she heads out of town, please call.

## 2017-06-20 NOTE — Telephone Encounter (Signed)
I called pt, see other telephone note from today.

## 2017-06-20 NOTE — Progress Notes (Signed)
Subjective:    Patient ID: Amber Bentley, female    DOB: 07/09/1976, 41 y.o.   MRN: [id number]  Patient presents today for complete physical.  HPI Headache: Chronic, intermittent Associated with nausea. No aura. No OTC medication used.  HTN: uncontrolled BP Readings from Last 3 Encounters:  06/20/17 (!) 150/100  04/30/17 (!) 148/76  04/27/17 138/79   Sleep Apnea: Still waiting on neurology to provided CPAP machine.  Immunizations: (TDAP, Hep C screen, Pneumovax, Influenza, zoster)  Health Maintenance  Topic Date Due  . Flu Shot  07/17/2017  . Tetanus Vaccine  09/16/2024  . HIV Screening  Completed  . Pap Smear  Excluded   Diet:regular.  Weight:  Wt Readings from Last 3 Encounters:  06/20/17 (!) 323 lb (146.5 kg)  04/30/17 (!) 307 lb (139.3 kg)  04/08/17 (!) 303 lb (137.4 kg)   Exercise:none.  Fall Risk: Fall Risk  05/14/2017 02/21/2017  Falls in the past year? No No   Home Safety:home with wife and daughter.  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 No hx on abnormal PAP.  Vision:up to date, done annually.  Dental:needed, will schedule  Advanced Directive: Advanced Directives 05/14/2017  Does Patient Have a Medical Advance Directive? No  Type of Advance Directive -  Does patient want to make changes to medical advance directive? -  Copy of Healthcare Power of Attorney in Chart? -  Would patient like information on creating a medical advance directive? No - Patient declined   Sexual History (birth control, marital status, STD):married, sexually active with same sex partner.  Medications and allergies reviewed with patient and updated if appropriate.  Patient Active Problem List   Diagnosis Date Noted  . Chronic  diastolic CHF (congestive heart failure) (HCC) 03/01/2017  . Pulmonary hypertension (HCC) 03/01/2017  . Obesity, morbid (HCC)   . SOB (shortness of breath)   . Migraine without status migrainosus, not intractable   . Altered mental state   . Hypertensive cardiomyopathy, 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HCC)   . Congestive heart failure (HCC)   . Pulmonary edema   . 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 HTN (hypertension), benign 02/21/2017  . Adjustment disorder with mixed anxiety and depressed mood 02/21/2017  . 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 Hypertensive emergency 02/21/2017  . Tobacco abuse 02/21/2017  . Chest pain 02/21/2017  . Anxiety and depression 02/21/2017    Current Outpatient Prescriptions on File Prior to Visit  Medication Sig Dispense Refill  . carvedilol (COREG) 12.5 MG tablet Take 1 tablet (12.5 mg total) by mouth 2 (two) times daily with a meal. 60 tablet 3  . fluticasone (FLONASE) 50 MCG/ACT nasal spray Place 2 sprays into both nostrils daily. 16 g 3  . lisinopril (PRINIVIL,ZESTRIL) 20 MG tablet Take 1 tablet (20 mg total) by mouth daily. 90 tablet 1  . pantoprazole (PROTONIX) 40 MG tablet Take 1 tablet (40 mg total) by mouth 2 (two) times daily. 180 tablet 1  . rosuvastatin (CRESTOR) 10 MG tablet Take 1 tablet (10 mg total) by mouth daily. 90 tablet 3  . amLODipine (NORVASC) 2.5 MG tablet Take 1 tablet (2.5 mg total) by mouth daily. 30 tablet 11  . carvedilol (COREG) 12.5 MG tablet TAKE  1 TABLET BY MOUTH TWICE DAILY WITH MEALS (Patient not taking: Reported on 06/20/2017) 180 tablet 0  . spironolactone (ALDACTONE) 25 MG tablet Take 1 tablet (25 mg total) by mouth daily. 90 tablet 3   No current facility-administered medications on file prior to visit.     Past Medical History:  Diagnosis Date  . Anxiety   . CHF (congestive heart failure) (HCC)   . Depression     Past Surgical History:  Procedure Laterality Date  .  ABDOMINAL HYSTERECTOMY      Social History   Social History  . Marital status: Married    Spouse name: N/A  . Number of children: 2  . Years of education: MA   Occupational History  . [employer]    Social History Main Topics  . Smoking status: Former Smoker    Packs/day: 0.25    Years: 10.00    Types: Cigarettes  . Smokeless tobacco: Never Used  . Alcohol use Yes     Comment: rarely  . Drug use: No  . Sexual activity: Not Asked   Other Topics Concern  . None   Social History Narrative   Drinks coffee daily     Family History  Problem Relation Age of Onset  . Hypertension Mother   . Cancer Mother        breast cancer  . Diabetes Father   . Heart disease Father 80  . Hyperlipidemia Father   . Hypertension Father   . Depression Father   . Thyroid disease Sister   . Sudden death Brother   . Cancer Maternal Aunt        breast cancer  . Cancer Paternal Grandfather        brain cancer        Review of Systems  Constitutional: Negative for fever, malaise/fatigue and weight loss.  HENT: Negative for congestion and sore throat.   Eyes: Negative for blurred vision and photophobia.       Negative for visual changes  Respiratory: Negative for cough, sputum production and shortness of breath.   Cardiovascular: Positive for leg swelling. Negative for chest pain, palpitations, orthopnea and PND.  Gastrointestinal: Positive for nausea. Negative for abdominal pain, blood in stool, constipation, diarrhea, heartburn and vomiting.  Genitourinary: Negative for dysuria, frequency and urgency.  Musculoskeletal: Negative for falls, joint pain and myalgias.  Skin: Negative for rash.  Neurological: Positive for headaches. Negative for dizziness and sensory change.  Endo/Heme/Allergies: Does not bruise/bleed easily.  Psychiatric/Behavioral: Negative for depression, substance abuse and suicidal ideas. The patient is not nervous/anxious.     Objective:    Vitals:   06/20/17 0809  BP: (!) 150/100  Pulse: 68  Temp: 98.6 F (37 C)    Body mass index is 49.11 kg/m.   Physical Examination:  Physical Exam  Constitutional: She is oriented to person, place, and time and well-developed, well-nourished, and in no distress. No distress.  HENT:  Right Ear: External ear normal.  Left Ear: External ear normal.  Nose: Nose normal.  Mouth/Throat: Oropharynx is clear and moist. No oropharyngeal exudate.  Eyes: Conjunctivae and EOM are normal. Pupils are equal, round, and reactive to light. No scleral icterus.  Neck: Normal range of motion. Neck supple. No thyromegaly present.  Cardiovascular: Normal rate, regular rhythm, normal heart sounds and intact distal pulses.   Pulmonary/Chest: Effort normal and breath sounds normal. She exhibits no tenderness and no bony tenderness. Right breast exhibits no inverted nipple, no mass,  no nipple discharge, no skin change and no tenderness. Left breast exhibits no inverted nipple, no mass, no nipple discharge, no skin change and no tenderness. Breasts are symmetrical.  Abdominal: Soft. Bowel sounds are normal. She exhibits no distension. There is no tenderness.  Musculoskeletal: Normal range of motion. She exhibits edema. She exhibits no tenderness.  Lymphadenopathy:    She has no cervical adenopathy.  Neurological: She is alert and oriented to person, place, and time. Gait normal.  Skin: Skin is warm and dry.  Psychiatric: Affect and judgment normal.  Vitals reviewed.   ASSESSMENT and PLAN:  Ajia was seen today for annual exam.  Diagnoses and all orders for this visit:  Encounter for preventative adult health care exam with abnormal findings  Acute combined systolic and diastolic congestive heart failure (HCC) -     Basic metabolic panel; Future -     B Nat Peptide; Future  HTN (hypertension), benign  Fatigue, unspecified type -     TSH; Future -     T4, free; Future  Hypochromic  microcytic anemia -     CBC; Future -     Ferritin; Future -     Iron Binding Cap (TIBC); Future  Migraine without aura and without status migrainosus, not intractable -     butalbital-acetaminophen-caffeine (FIORICET, ESGIC) 50-325-40 MG tablet; Take 1-2 tablets by mouth every 6 (six) hours as needed for headache or migraine.    No problem-specific Assessment & Plan notes found for this encounter.     Follow up: Return in about 6 months (around 12/21/2017) for HTN and anemia.  Alysia Penna, NP

## 2017-06-20 NOTE — Telephone Encounter (Signed)
I called pt. I advised pt that Dr. Frances Furbish reviewed their sleep study results and found that pt did well with the cpap during the latest study. Dr. Frances Furbish recommends that pt start a cpap at home. I reviewed PAP compliance expectations with the pt. Pt is agreeable to starting a CPAP. I advised pt that an order will be sent to a DME, [facility], and [facility] will call the pt within about one week after they file with the pt's insurance. [facility] will show the pt how to use the machine, fit for masks, and troubleshoot the CPAP if needed. A follow up appt was made for insurance purposes with Dr. Frances Furbish on September 20th, 2018 at 9:30am. Pt verbalized understanding to arrive 15 minutes early and bring their CPAP. A letter with all of this information in it will be mailed to the pt as a reminder. I verified with the pt that the address we have on file is correct. Pt verbalized understanding of results. Pt had no questions at this time but was encouraged to call back if questions arise.

## 2017-06-20 NOTE — Patient Instructions (Addendum)
Consider referral to GI due to microcytic anemia.  Call neurology again for update on CPAP machine.  Go to basement for blood draw. You will be called with lab results.   Migraine Headache A migraine headache is a very strong throbbing pain on one side or both sides of your head. Migraines can also cause other symptoms. Talk with your doctor about what things may bring on (trigger) your migraine headaches. Follow these instructions at home: Medicines  Take over-the-counter and prescription medicines only as told by your doctor.  Do not drive or use heavy machinery while taking prescription pain medicine.  To prevent or treat constipation while you are taking prescription pain medicine, your doctor may recommend that you: ? Drink enough fluid to keep your pee (urine) clear or pale yellow. ? Take over-the-counter or prescription medicines. ? Eat foods that are high in fiber. These include fresh fruits and vegetables, whole grains, and beans. ? Limit foods that are high in fat and processed sugars. These include fried and sweet foods. Lifestyle  Avoid alcohol.  Do not use any products that contain nicotine or tobacco, such as cigarettes and e-cigarettes. If you need help quitting, ask your doctor.  Get at least 8 hours of sleep every night.  Limit your stress. General instructions   Keep a journal to find out what may bring on your migraines. For example, write down: ? What you eat and drink. ? How much sleep you get. ? Any change in what you eat or drink. ? Any change in your medicines.  If you have a migraine: ? Avoid things that make your symptoms worse, such as bright lights. ? It may help to lie down in a dark, quiet room. ? Do not drive or use heavy machinery. ? Ask your doctor what activities are safe for you.  Keep all follow-up visits as told by your doctor. This is important. Contact a doctor if:  You get a migraine that is different or worse than your usual  migraines. Get help right away if:  Your migraine gets very bad.  You have a fever.  You have a stiff neck.  You have trouble seeing.  Your muscles feel weak or like you cannot control them.  You start to lose your balance a lot.  You start to have trouble walking.  You pass out (faint). This information is not intended to replace advice given to you by your health care provider. Make sure you discuss any questions you have with your health care provider. Document Released: 09/11/2008 Document Revised: 06/22/2016 Document Reviewed: 05/21/2016 Elsevier Interactive Patient Education  2017 [employer].

## 2017-06-20 NOTE — Telephone Encounter (Signed)
-----   Message from Huston Foley, MD sent at 06/18/2017  7:10 PM EDT ----- Patient referred by Amber Bentley, seen by me on 04/30/17, diagnostic PSG on 05/24/17, CPAP study on 06/14/17.    Please call and inform patient that I have entered an order for treatment with positive airway pressure (PAP) treatment of obstructive sleep apnea (OSA). She did well during the latest sleep study with CPAP. We will, therefore, arrange for a machine for home use through a DME (durable medical equipment) company of Her choice; and I will see the patient back in follow-up in about 10 weeks; she can see MM or CM if needed. Please also explain to the patient that I will be looking out for compliance data, which can be downloaded from the machine (stored on an SD card, that is inserted in the machine) or via remote access through a modem, that is built into the machine. At the time of the followup appointment we will discuss sleep study results and how it is going with PAP treatment at home. Please advise patient to bring Her machine at the time of the first FU visit, even though this is cumbersome. Bringing the machine for every visit after that will likely not be needed, but often helps for the first visit to troubleshoot if needed. Please re-enforce the importance of compliance with treatment and the need for [name] to monitor compliance data - often an insurance requirement and actually good feedback for the patient as far as how they are doing.  Also remind patient, that any interim PAP machine or mask issues should be first addressed with the DME company, as they can often help better with technical and mask fit issues. Please ask if patient has a preference regarding DME company.  Please also make sure, the patient has a follow-up appointment with me or Aundra Millet or Eber Jones in about 10 weeks from the setup date, thanks.  Once you have spoken to the patient - and faxed/routed report to PCP and referring MD (if other than PCP), you can  close this encounter, thanks,   Huston Foley, MD, PhD [facility] ([facility]

## 2017-06-28 DIAGNOSIS — I159 Secondary hypertension, unspecified: Secondary | ICD-10-CM | POA: Diagnosis not present

## 2017-06-28 DIAGNOSIS — I5032 Chronic diastolic (congestive) heart failure: Secondary | ICD-10-CM | POA: Diagnosis not present

## 2017-06-28 DIAGNOSIS — I5041 Acute combined systolic (congestive) and diastolic (congestive) heart failure: Secondary | ICD-10-CM | POA: Diagnosis not present

## 2017-07-09 MED FILL — ROSUVASTATIN CALCIUM 10 MG: 10 | 30 days supply | Qty: 30 | Fill #2

## 2017-07-09 MED FILL — SPIRONOLACTONE 25 MG TABLET: 25 | 90 days supply | Qty: 90 | Fill #1

## 2017-07-09 MED FILL — AMLODIPINE BESYLATE 2.5 MG: 2.5 | 30 days supply | Qty: 30 | Fill #2

## 2017-07-16 DIAGNOSIS — G4733 Obstructive sleep apnea (adult) (pediatric): Secondary | ICD-10-CM | POA: Diagnosis not present

## 2017-07-18 ENCOUNTER — Ambulatory Visit (INDEPENDENT_AMBULATORY_CARE_PROVIDER_SITE_OTHER): Payer: 59 | Admitting: Internal Medicine

## 2017-07-18 ENCOUNTER — Encounter: Payer: Self-pay | Admitting: Internal Medicine

## 2017-07-18 VITALS — BP 122/82 | HR 66 | Ht 68.0 in | Wt 335.0 lb

## 2017-07-18 DIAGNOSIS — F419 Anxiety disorder, unspecified: Secondary | ICD-10-CM

## 2017-07-18 DIAGNOSIS — F329 Major depressive disorder, single episode, unspecified: Secondary | ICD-10-CM

## 2017-07-18 DIAGNOSIS — G4733 Obstructive sleep apnea (adult) (pediatric): Secondary | ICD-10-CM | POA: Diagnosis not present

## 2017-07-18 DIAGNOSIS — I10 Essential (primary) hypertension: Secondary | ICD-10-CM | POA: Diagnosis not present

## 2017-07-18 DIAGNOSIS — F32A Depression, unspecified: Secondary | ICD-10-CM

## 2017-07-18 DIAGNOSIS — J3489 Other specified disorders of nose and nasal sinuses: Secondary | ICD-10-CM | POA: Insufficient documentation

## 2017-07-18 MED ORDER — SULFAMETHOXAZOLE-TRIMETHOPRIM 800-160 MG PO TABS: 1.0000 | ORAL_TABLET | Freq: Two times a day (BID) | ORAL | 0 refills | Status: DC

## 2017-07-18 MED ORDER — MUPIROCIN CALCIUM 2 % NA OINT: 1.0000 "application " | TOPICAL_OINTMENT | Freq: Two times a day (BID) | NASAL | 0 refills | Status: DC

## 2017-07-18 MED FILL — MUPIROCIN 2% OINTMENT: 2 | 5 days supply | Qty: 22 | Fill #0

## 2017-07-18 MED FILL — SULFAMETHOXAZOLE/TMP DS TAB: 800-160 | 10 days supply | Qty: 20 | Fill #0

## 2017-07-18 NOTE — Patient Instructions (Signed)
Please take all new medication as prescribed - the ointment antibiotic and pill antibiotic  Please continue all other medications as before, and refills have been done if requested.  Please have the pharmacy call with any other refills you may need.  Please keep your appointments with your specialists as you may have planned  You are given the work note

## 2017-07-18 NOTE — Progress Notes (Signed)
Subjective:    Patient ID: Amber Bentley, female    DOB: 10/26/1976, 41 y.o.   MRN: [id number]  HPI  Here to f/u with c/o red, tender, painful right nares with a new "white spot" seeming to grow new onset since yesterday, with low grade temp but no drainage.  Has a picture from the day prior to this exam with small white central area, now seems slightly larger.  No HA, chills, ST, cough.  Partner at home is Nurse at [employer], no definite hx of MRSA.  Seemed to start the day after start of a certain size nasal CPAP that seemed to irritate her.  This was started due to recent dx and CPAP tx.  Pt denies chest pain, increased sob or doe, wheezing, orthopnea, PND, increased LE swelling, palpitations, dizziness or syncope. Past Medical History:  Diagnosis Date  . Anxiety   . CHF (congestive heart failure) (HCC)   . Depression    Past Surgical History:  Procedure Laterality Date  . ABDOMINAL HYSTERECTOMY      reports that she has quit smoking. Her smoking use included Cigarettes. She has a 2.50 pack-year smoking history. She has never used smokeless tobacco. She reports that she drinks alcohol. She reports that she does not use drugs. family history includes Cancer in her maternal aunt, mother, and paternal grandfather; Depression in her father; Diabetes in her father; Heart disease (age of onset: 37) in her father; Hyperlipidemia in her father; Hypertension in her father and mother; Sudden death in her brother; Thyroid disease in her sister. Allergies  Allergen Reactions  . 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   [name] [Naproxen] Other (See Comments)    Pt states that this medication makes her loopy.    Current Outpatient Prescriptions on File Prior to Visit  Medication Sig Dispense Refill  . butalbital-acetaminophen-caffeine (FIORICET, ESGIC) 50-325-40 MG tablet Take 1-2 tablets by mouth every 6 (six) hours as needed for headache or migraine. 20 tablet 0    . carvedilol (COREG) 12.5 MG tablet Take 1 tablet (12.5 mg total) by mouth 2 (two) times daily with a meal. 60 tablet 3  . fluticasone (FLONASE) 50 MCG/ACT nasal spray Place 2 sprays into both nostrils daily. 16 g 3  . lisinopril (PRINIVIL,ZESTRIL) 20 MG tablet Take 1 tablet (20 mg total) by mouth daily. 90 tablet 1  . pantoprazole (PROTONIX) 40 MG tablet Take 1 tablet (40 mg total) by mouth 2 (two) times daily. 180 tablet 1  . amLODipine (NORVASC) 2.5 MG tablet Take 1 tablet (2.5 mg total) by mouth daily. 30 tablet 11  . rosuvastatin (CRESTOR) 10 MG tablet Take 1 tablet (10 mg total) by mouth daily. 90 tablet 3  . spironolactone (ALDACTONE) 25 MG tablet Take 1 tablet (25 mg total) by mouth daily. 90 tablet 3   No current facility-administered medications on file prior to visit.    Review of Systems  Constitutional: Negative for other unusual diaphoresis or sweats HENT: Negative for ear discharge or swelling Eyes: Negative for other worsening visual disturbances Respiratory: Negative for stridor or other swelling  Gastrointestinal: Negative for worsening distension or other blood Genitourinary: Negative for retention or other urinary change Musculoskeletal: Negative for other MSK pain or swelling Skin: Negative for color change or other new lesions Neurological: Negative for worsening tremors and other numbness  Psychiatric/Behavioral: Negative for worsening agitation or other fatigue All other system neg per pt  Objective:   Physical Exam BP 122/82   Pulse 66   Ht 5\' 8"  (1.727 m)   Wt (!) 335 lb (152 kg)   SpO2 99%   BMI 50.94 kg/m  VS noted, not ill appearing, moderate nervous affect Constitutional: Pt appears in NAD HENT: Head: NCAT.  Right Ear: External ear normal.  Left Ear: External ear normal.  Eyes: . Pupils are equal, round, and reactive to light. Conjunctivae and EOM are normal Nose: without d/c or deformity but with right nares with approx 5 mm area central white  area with mild right nares erythema, all tender but no drainage Neck: Neck supple. Gross normal ROM Cardiovascular: Normal rate and regular rhythm.   Pulmonary/Chest: Effort normal and breath sounds without rales or wheezing.  Neurological: Pt is alert. At baseline orientation, motor grossly intact Skin: Skin is warm. No rashes, other new lesions, no LE edema Psychiatric: Pt behavior is normal without agitation , 1-2+ nervous No other exam findings    Assessment & Plan:

## 2017-07-19 ENCOUNTER — Encounter (HOSPITAL_COMMUNITY): Payer: Self-pay

## 2017-07-19 ENCOUNTER — Telehealth: Payer: Self-pay | Admitting: Internal Medicine

## 2017-07-19 ENCOUNTER — Emergency Department (HOSPITAL_COMMUNITY)
Admission: EM | Admit: 2017-07-19 | Discharge: 2017-07-19 | Disposition: A | Discharge status: Home / Self Care | Payer: 59 | Attending: Emergency Medicine | Admitting: Emergency Medicine

## 2017-07-19 DIAGNOSIS — J3489 Other specified disorders of nose and nasal sinuses: Secondary | ICD-10-CM | POA: Diagnosis present

## 2017-07-19 DIAGNOSIS — Z87891 Personal history of nicotine dependence: Secondary | ICD-10-CM | POA: Insufficient documentation

## 2017-07-19 DIAGNOSIS — J340 Abscess, furuncle and carbuncle of nose: Secondary | ICD-10-CM | POA: Diagnosis not present

## 2017-07-19 DIAGNOSIS — I5032 Chronic diastolic (congestive) heart failure: Secondary | ICD-10-CM | POA: Insufficient documentation

## 2017-07-19 DIAGNOSIS — I110 Hypertensive heart disease with heart failure: Secondary | ICD-10-CM | POA: Insufficient documentation

## 2017-07-19 DIAGNOSIS — Z79899 Other long term (current) drug therapy: Secondary | ICD-10-CM | POA: Insufficient documentation

## 2017-07-19 MED ORDER — AMOXICILLIN-POT CLAVULANATE 875-125 MG PO TABS: 1.0000 | ORAL_TABLET | Freq: Two times a day (BID) | ORAL | 0 refills | Status: DC

## 2017-07-19 NOTE — Telephone Encounter (Signed)
Noted  

## 2017-07-19 NOTE — Telephone Encounter (Signed)
Pt called stating that she believes that the infection is getting worse. She mentioned that a friend that saw her last night and again today could tell that it was more visible and looked to be much worse. After speaking with Shiron and Dr Jonny Ruiz it was recommend that she go to the ED. She expressed understanding.

## 2017-07-19 NOTE — ED Provider Notes (Signed)
[facility]-EMERGENCY DEPT Provider Note   CSN: [id number] Arrival date & time: 07/19/17  1457     History   Chief Complaint Chief Complaint  Patient presents with  . nose pain    HPI Amber Bentley is a 41 y.o. female with a PMHx of CHF, depression, pulmonary HTN, hypertensive cardiomyopathy, and other medical conditions listed below, who presents to the ED with complaints of concern for worsening R nostril infection. Patient states that she started using her new CPAP machine on 07/16/17 and the next morning she woke up and noticed that the right nostril had been rubbed due to the nose piece being the wrong size. Over the course of the day the area became more painful and erythematous, and then a white blister appeared, by the next day (yesterday) it was a larger pustule that eventually popped and crusted over and the swelling and redness was worse. Pt was seen at [facility] yesterday, and presumptively told it was MRSA (no actual culture done), and rx'd mupirocin and bactrim which she has been compliant with (2 doses so far of each). There are no notes from yesterday's office visit in the system yet, however there's a phone note from today where she called to inform them it seemed to be getting worse, and was advised to go to the ED for medical attention. Patient states that she feels as though today the redness and swelling has worsened, and now her right eye at the lacrimal gland and R cheek are itchy. She describes the pain is 7/10 intermittent tingly and burning right nostril pain that radiates into the right cheek, worse with palpation of the area, with no treatments tried prior to arrival. She reports associated symptoms including R eye/lacrimal gland itchiness, and crusting/erythema/swelling of the right nostril. She denies any ongoing drainage from the nostril, red streaking, periorbital swelling, eye pain or redness, eye discharge, vision changes, warmth to the area,  fevers, chills, CP, SOB, abd pain, N/V/D/C, hematuria, dysuria, arthralgias, numbness, tingling, focal weakness, or any other complaints at this time.    The history is provided by the patient and medical records. No language interpreter was used.    Past Medical History:  Diagnosis Date  . Anxiety   . CHF (congestive heart failure) (HCC)   . Depression     Patient Active Problem List   Diagnosis Date Noted  . Nasal sore 07/18/2017  . Chronic diastolic CHF (congestive heart failure) (HCC) 03/01/2017  . Pulmonary hypertension (HCC) 03/01/2017  . Obesity, morbid (HCC)   . SOB (shortness of breath)   . Migraine without status migrainosus, not intractable   . Altered mental state   . Hypertensive cardiomyopathy, 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HCC)   . Congestive heart failure (HCC)   . Pulmonary edema   . 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 HTN (hypertension), benign 02/21/2017  . Adjustment disorder with mixed anxiety and depressed mood 02/21/2017  . 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 Hypertensive emergency 02/21/2017  . Tobacco abuse 02/21/2017  . Chest pain 02/21/2017  . Anxiety and depression 02/21/2017    Past Surgical History:  Procedure Laterality Date  . ABDOMINAL HYSTERECTOMY      OB History    No data available       Home Medications    Prior to Admission medications   Medication Sig Start Date End Date Taking? Authorizing Provider  amLODipine (NORVASC) 2.5 MG tablet Take 1 tablet (2.5 mg  total) by mouth daily. 03/14/17 06/12/17  Nahser, Deloris Ping, MD  butalbital-acetaminophen-caffeine (FIORICET, ESGIC) [phone] MG tablet Take 1-2 tablets by mouth every 6 (six) hours as needed for headache or migraine. 06/20/17 06/20/18  Nche, Bonna Gains, NP  carvedilol (COREG) 12.5 MG tablet Take 1 tablet (12.5 mg total) by mouth 2 (two) times daily with a meal. 04/29/17   Nche, Bonna Gains, NP  fluticasone (FLONASE) 50 MCG/ACT  nasal spray Place 2 sprays into both nostrils daily. 04/01/17   Nche, Bonna Gains, NP  lisinopril (PRINIVIL,ZESTRIL) 20 MG tablet Take 1 tablet (20 mg total) by mouth daily. 05/21/17   Nche, Bonna Gains, NP  mupirocin nasal ointment (BACTROBAN) 2 % Place 1 application into the nose 2 (two) times daily. Use one-half of tube in each nostril twice daily for five (5) days. After application, press sides of nose together and gently massage. 07/18/17   Corwin Levins, MD  pantoprazole (PROTONIX) 40 MG tablet Take 1 tablet (40 mg total) by mouth 2 (two) times daily. 05/21/17   Nche, Bonna Gains, NP  rosuvastatin (CRESTOR) 10 MG tablet Take 1 tablet (10 mg total) by mouth daily. 04/10/17 07/09/17  Nahser, Deloris Ping, MD  spironolactone (ALDACTONE) 25 MG tablet Take 1 tablet (25 mg total) by mouth daily. 03/01/17 05/30/17  Nahser, Deloris Ping, MD  sulfamethoxazole-trimethoprim (BACTRIM DS,SEPTRA DS) 800-160 MG tablet Take 1 tablet by mouth 2 (two) times daily. 07/18/17   Corwin Levins, MD    Family History Family History  Problem Relation Age of Onset  . Hypertension Mother   . Cancer Mother        breast cancer  . Diabetes Father   . Heart disease Father 82  . Hyperlipidemia Father   . Hypertension Father   . Depression Father   . Thyroid disease Sister   . Sudden death Brother   . Cancer Maternal Aunt        breast cancer  . Cancer Paternal Grandfather        brain cancer    Social History Social History  Substance Use Topics  . Smoking status: Former Smoker    Packs/day: 0.25    Years: 10.00    Types: Cigarettes  . Smokeless tobacco: Never Used  . Alcohol use Yes     Comment: rarely     Allergies   Food and Aleve [naproxen]   Review of Systems Review of Systems  Constitutional: Negative for chills and fever.  Eyes: Positive for itching. Negative for pain, discharge, redness and visual disturbance.  Respiratory: Negative for shortness of breath.   Cardiovascular: Negative for chest  pain.  Gastrointestinal: Negative for abdominal pain, constipation, diarrhea, nausea and vomiting.  Genitourinary: Negative for dysuria and hematuria.  Musculoskeletal: Positive for myalgias (R nostril). Negative for arthralgias.  Skin: Positive for color change and wound.  Allergic/Immunologic: Negative for immunocompromised state.  Neurological: Negative for weakness and numbness.  Psychiatric/Behavioral: Negative for confusion.   All other systems reviewed and are negative for acute change except as noted in the HPI.    Physical Exam Updated Vital Signs BP (!) 159/83 (BP Location: Left Arm)   Pulse 63   Temp 98.5 F (36.9 C) (Oral)   Resp 18   Ht 5\' 8"  (1.727 m)   Wt (!) 152 kg (335 lb)   SpO2 96%   BMI 50.94 kg/m   Physical Exam  Constitutional: She is oriented to person, place, and time. Vital signs are normal. She appears well-developed  and well-nourished.  Non-toxic appearance. No distress.  Afebrile, nontoxic, NAD  HENT:  Head: Normocephalic and atraumatic.  Nose: Sinus tenderness present. No mucosal edema or rhinorrhea.  Mouth/Throat: Uvula is midline, oropharynx is clear and moist and mucous membranes are normal. No trismus in the jaw. No uvula swelling.    R nare with small pustules on outer nare, some have opened and have a scant amount of yellowish crusted material, no ongoing drainage noted; mildly swollen and erythematous around the edge of the nare, but no spreading erythema/warmth, no swelling to remainder of nose or face; no fluctuance or focal abscess. Nasal passage clear. Oropharynx clear and moist  Eyes: Pupils are equal, round, and reactive to light. Conjunctivae and EOM are normal. Right eye exhibits no discharge and no exudate. Left eye exhibits no discharge and no exudate.  PERRL, EOMI, no nystagmus, no visual field deficits, no periorbital swelling or erythema, no ocular drainage, conjunctiva clear  Neck: Normal range of motion. Neck supple.    Cardiovascular: Normal rate, regular rhythm, normal heart sounds and intact distal pulses.  Exam reveals no gallop and no friction rub.   No murmur heard. Pulmonary/Chest: Effort normal and breath sounds normal. No respiratory distress. She has no decreased breath sounds. She has no wheezes. She has no rhonchi. She has no rales.  Abdominal: Soft. Normal appearance and bowel sounds are normal. She exhibits no distension. There is no tenderness. There is no rigidity, no rebound, no guarding, no CVA tenderness, no tenderness at McBurney's point and negative Murphy's sign.  Musculoskeletal: Normal range of motion.  Neurological: She is alert and oriented to person, place, and time. She has normal strength. No sensory deficit.  Skin: Skin is warm, dry and intact. No rash noted.  Psychiatric: She has a normal mood and affect.  Nursing note and vitals reviewed.    ED Treatments / Results  Labs (all labs ordered are listed, but only abnormal results are displayed) Labs Reviewed - No data to display  EKG  EKG Interpretation None       Radiology No results found.  Procedures Procedures (including critical care time)  Medications Ordered in ED Medications - No data to display   Initial Impression / Assessment and Plan / ED Course  I have reviewed the triage vital signs and the nursing notes.  Pertinent labs & imaging results that were available during my care of the patient were reviewed by me and considered in my medical decision making (see chart for details).     41 y.o. female here with slight worsening of nasal swelling/redness since starting abx last night for suspected MRSA. Has only had 2 doses of bactrim and mupirocin. On exam, R nare with small pustules that have opened and are crusted, mildly erythematous around the edge of the nare, no red streaking, very mildly swollen but no significant swelling of remainder of nose/face, eyes clear. No fluctuance or focal abscess.  Actually doesn't look to be concerningly worse compared to photos from yesterday; no periorbital involvement, no spreading cellulitis. Seems just like a local cellulitic issue, and hasn't been on abx for even 24hrs so doubt this is failure of therapy. Mupirocin and bactrim would be good for MRSA coverage, but will add-on augmentin to cover HEENT bugs better. Advised warm compresses, tylenol/motrin for pain, and PCP f/up in 2 days for recheck. Strict return precautions advised. I explained the diagnosis and have given explicit precautions to return to the ER including for any other new or  worsening symptoms. The patient understands and accepts the medical plan as it's been dictated and I have answered their questions. Discharge instructions concerning home care and prescriptions have been given. The patient is STABLE and is discharged to home in good condition.    Final Clinical Impressions(s) / ED Diagnoses   Final diagnoses:  Cellulitis of external nose    New Prescriptions New Prescriptions   AMOXICILLIN-CLAVULANATE (AUGMENTIN) 875-125 MG TABLET    Take 1 tablet by mouth 2 (two) times daily. One po bid x 7 days     [address], [place], [place] 07/19/17 1819    Nira Conn, MD 07/20/17 [phone]

## 2017-07-19 NOTE — ED Triage Notes (Signed)
patient states she was diagnosed with MRSA yesterday. Patient states she had a blistered area to the right nares and now she has redness and swelling of entire nose. patient also reports that she has tenderness to the area below her right eye. Patient states she has been using Bactrim and Bactroban. Patient states she called [facility] and was told to come to the ED.

## 2017-07-19 NOTE — Discharge Instructions (Signed)
Keep wound clean and dry. Apply warm compresses to affected area throughout the day. Continue taking bactrim and using mupirocin as directed, and start using augmentin tonight; continue all of them until they're finished. Alternate between tylenol and motrin as needed for pain. Followup with your Primary Care doctor in 2 days for wound recheck. Monitor area for signs of infection to include, but not limited to: increasing pain, spreading redness, drainage/pus, worsening swelling, or fevers. Return to emergency department for emergent changing or worsening symptoms.

## 2017-07-20 DIAGNOSIS — G4733 Obstructive sleep apnea (adult) (pediatric): Secondary | ICD-10-CM | POA: Insufficient documentation

## 2017-07-20 NOTE — Assessment & Plan Note (Signed)
Mild elevated, likely situational, o/w stable overall by history and exam, recent data reviewed with pt, and pt to continue medical treatment as before,  to f/u any worsening symptoms or concerns BP Readings from Last 3 Encounters:  07/19/17 (!) 149/71  07/18/17 122/82  06/20/17 (!) 150/100

## 2017-07-20 NOTE — Assessment & Plan Note (Signed)
>>  ASSESSMENT AND PLAN FOR ANXIETY AND DEPRESSION WRITTEN ON 07/20/2017  8:03 PM BY Corwin Levins, MD  Pt remarkably nervous today, declines need for change in tx,  to f/u any worsening symptoms or concerns

## 2017-07-20 NOTE — Assessment & Plan Note (Signed)
With early cellulitiscant r/o mrsa but also cant r/o other ENT pathogens, for topical mupirocin asd, and septra ds; advised to go to ED for any worsening and to take a followup selfie picture today to be able to compare at ED if does not improved in 1-2 days

## 2017-07-20 NOTE — Assessment & Plan Note (Signed)
Pt remarkably nervous today, declines need for change in tx,  to f/u any worsening symptoms or concerns

## 2017-07-20 NOTE — Assessment & Plan Note (Signed)
Pt to follow up with provider to consider different size nasal CPAP

## 2017-07-24 ENCOUNTER — Other Ambulatory Visit: Payer: 59

## 2017-07-29 DIAGNOSIS — I5032 Chronic diastolic (congestive) heart failure: Secondary | ICD-10-CM | POA: Diagnosis not present

## 2017-07-29 DIAGNOSIS — I159 Secondary hypertension, unspecified: Secondary | ICD-10-CM | POA: Diagnosis not present

## 2017-07-29 DIAGNOSIS — I5041 Acute combined systolic (congestive) and diastolic (congestive) heart failure: Secondary | ICD-10-CM | POA: Diagnosis not present

## 2017-07-30 ENCOUNTER — Encounter: Payer: Self-pay | Admitting: Cardiovascular Disease

## 2017-07-30 ENCOUNTER — Ambulatory Visit (INDEPENDENT_AMBULATORY_CARE_PROVIDER_SITE_OTHER): Payer: 59 | Admitting: Cardiovascular Disease

## 2017-07-30 DIAGNOSIS — Z13220 Encounter for screening for lipoid disorders: Secondary | ICD-10-CM | POA: Diagnosis not present

## 2017-07-30 DIAGNOSIS — I272 Pulmonary hypertension, unspecified: Secondary | ICD-10-CM

## 2017-07-30 DIAGNOSIS — I10 Essential (primary) hypertension: Secondary | ICD-10-CM

## 2017-07-30 NOTE — Patient Instructions (Signed)

## 2017-07-30 NOTE — Progress Notes (Signed)
Cardiology Office Note   Date:  07/30/2017   ID:  Amber Bentley, DOB 24-Jun-1976, MRN [id number]  PCP:  Anne Ng, NP  Cardiologist:   Kristeen Miss, MD   Chief Complaint  Patient presents with  . Hypertension  . Congestive Heart Failure   Problem list 1. Hypertensive emergency 2. Morbid obesity 3. Pulmonary hypertension-moderate 4. Possible sleep apnea   History of Present Illness: Amber Bentley is a 41 y.o. female who presents for follow-up of her recent hospitalization for hypertensive emergency.  Amber Bentley has had high blood pressure for several years but has not been on medications.  I saw her in the [facility] emergency. Room. She has been started on medications. She was in the hospital for 4 days  She seems to be tolerating her medications without any problems.  She had an echocardiogram which revealed mildly depressed left ventricle systolic function with an EF of 45-50%. She has grade 2 diastolic dysfunction.  She has moderate pulmonary hypertension with an estimate PA pressure of 59.  Is avoiding salt. 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  Is a Administrator .  Smokes some .    April 08, 2017:  Amber Bentley is seen as a work in visit today for chest pain . Stopped smoking in March .  Has gained some weight .  Wt. Today is 303 .   Has cluster headaches.   Wt Readings from Last 3 Encounters:  07/30/17 (!) 333 lb 6.4 oz (151.2 kg)  07/19/17 (!) 335 lb (152 kg)  07/18/17 (!) 335 lb (152 kg)    Amber Bentley started having some CP several years ago . Overall had a pretty good weekend - not much in the way of CP. The CP is a heaviness,   Last for hours.   Lasted most of the day yesterday . Seems to related to upper body movement - was not worsened with walking .     Has a significant family hx of CAD   [name], 2018: Amber Bentley is seen  Wt is 333.   Has not been exercising .   Teaches  preschool .  Has 2 children - age 33 ( son) and 67 ( daughter) . Eats poorly,   Knows that she eats the wrong foods.  Blames stress   BP has been stable , no heart failure symptoms  Has cluster headaches.   Developed a MRSA infection on her nose related to her CPAP .    Past Medical History:  Diagnosis Date  . Anxiety   . CHF (congestive heart failure) (HCC)   . Depression     Past Surgical History:  Procedure Laterality Date  . ABDOMINAL HYSTERECTOMY       Current Outpatient Prescriptions  Medication Sig Dispense Refill  . amLODipine (NORVASC) 2.5 MG tablet Take 1 tablet (2.5 mg total) by mouth daily. 30 tablet 11  . butalbital-acetaminophen-caffeine (FIORICET, ESGIC) 50-325-40 MG tablet Take 1-2 tablets by mouth every 6 (six) hours as needed for headache or migraine. 20 tablet 0  . carvedilol (COREG) 12.5 MG tablet Take 1 tablet (12.5 mg total) by mouth 2 (two) times daily with a meal. 60 tablet 3  . fluticasone (FLONASE) 50 MCG/ACT nasal spray Place 2 sprays into both nostrils daily. 16 g 3  . lisinopril (PRINIVIL,ZESTRIL) 20 MG tablet Take 1 tablet (20 mg total) by mouth daily. 90 tablet 1  .  pantoprazole (PROTONIX) 40 MG tablet Take 1 tablet (40 mg total) by mouth 2 (two) times daily. 180 tablet 1  . rosuvastatin (CRESTOR) 10 MG tablet Take 1 tablet (10 mg total) by mouth daily. 90 tablet 3  . spironolactone (ALDACTONE) 25 MG tablet Take 1 tablet (25 mg total) by mouth daily. 90 tablet 3   No current facility-administered medications for this visit.     No flowsheet data found.    Allergies:   Food and Aleve [naproxen]    Social History:  The patient  reports that she has quit smoking. Her smoking use included Cigarettes. She has a 2.50 pack-year smoking history. She has never used smokeless tobacco. She reports that she drinks alcohol. She reports that she does not use drugs.   Family History:  The patient's family history includes Cancer in her maternal aunt,  mother, and paternal grandfather; Depression in her father; Diabetes in her father; Heart disease (age of onset: 64) in her father; Hyperlipidemia in her father; Hypertension in her father and mother; Sudden death in her brother; Thyroid disease in her sister.    ROS:  Please see the history of present illness.    Review of Systems: Constitutional:  denies fever, chills, diaphoresis, appetite change and fatigue.  HEENT: denies photophobia, eye pain, redness, hearing loss, ear pain, congestion, sore throat, rhinorrhea, sneezing, neck pain, neck stiffness and tinnitus.  Respiratory: denies SOB, DOE, cough, chest tightness, and wheezing.  Cardiovascular: denies chest pain, palpitations and leg swelling.  Gastrointestinal: denies nausea, vomiting, abdominal pain, diarrhea, constipation, blood in stool.  Genitourinary: denies dysuria, urgency, frequency, hematuria, flank pain and difficulty urinating.  Musculoskeletal: denies  myalgias, back pain, joint swelling, arthralgias and gait problem.   Skin: denies pallor, rash and wound.  Neurological: denies dizziness, seizures, syncope, weakness, light-headedness, numbness and headaches.   Hematological: denies adenopathy, easy bruising, personal or family bleeding history.  Psychiatric/ Behavioral: denies suicidal ideation, mood changes, confusion, nervousness, sleep disturbance and agitation.       All other systems are reviewed and negative.    PHYSICAL EXAM: VS:  BP 118/68   Pulse 68   Ht 5\' 8"  (1.727 m)   Wt (!) 333 lb 6.4 oz (151.2 kg)   SpO2 97%   BMI 50.69 kg/m  , BMI Body mass index is 50.69 kg/m. GEN: Well nourished, well developed, in no acute distress  HEENT: normal  Neck: no JVD, carotid bruits, or masses Cardiac: RRR; no murmurs, rubs, or gallops,no edema  Respiratory:  clear to auscultation bilaterally, normal work of breathing GI: soft, nontender, nondistended, + BS MS: no deformity or atrophy  Skin: warm and dry, no  rash Neuro:  Strength and sensation are intact Psych: normal   EKG:  EKG is ordered today.  Sinus brady 55.   No ST or T wave changes.      Recent Labs: 04/14/2017: ALT 23 06/20/2017: BUN 23; Creatinine, Ser 1.19; Hemoglobin 13.7; Platelets 224.0; Potassium 4.7; Pro B Natriuretic peptide (BNP) 37.0; Sodium 140; TSH 3.01    Lipid Panel    Component Value Date/Time   CHOL 204 (H) 04/08/2017 1124   TRIG 54 04/08/2017 1124   HDL 61 04/08/2017 1124   CHOLHDL 3.3 04/08/2017 1124   CHOLHDL 4.7 02/22/2017 0420   VLDL 11 02/22/2017 0420   LDLCALC 132 (H) 04/08/2017 1124      Wt Readings from Last 3 Encounters:  07/30/17 (!) 333 lb 6.4 oz (151.2 kg)  07/19/17 (!) 335 lb (  152 kg)  07/18/17 (!) 335 lb (152 kg)      Other studies Reviewed: Additional studies/ records that were reviewed today include: . Review of the above records demonstrates:    ASSESSMENT AND PLAN:  1. Chest pain: no further Episodes of chest pain.  2.   Hypertensive emergency:   Her echocardiogram reveals mild left ventricle systolic dysfunction. She also has grade 2  diastolic dysfunction.  BP has been much better  Continue current medications.  3.  Pulmonary hypertension: She was found have moderate pulmonary hypertension on echo. She has been found to have obstructive sleep apnea. I strongly encouraged her to work on weight loss program.  4. Morbid obesity: I've encouraged her to call her medical doctor and be set up with a nutritionist. Encouraged her to exercise regularly   Current medicines are reviewed at length with the patient today.  The patient does not have concerns regarding medicines.  Labs/ tests ordered today include:  No orders of the defined types were placed in this encounter.   Disposition:   FU with me in 6 months.    Kristeen Miss, MD  07/30/2017 4:46 PM    [facility] [address] [place], [place], [place]  [address] Phone: [phone]; Fax: [phone]

## 2017-08-06 ENCOUNTER — Ambulatory Visit: Payer: 59 | Admitting: Internal Medicine

## 2017-08-13 MED FILL — ROSUVASTATIN CALCIUM 10 MG: 10 | 30 days supply | Qty: 30 | Fill #3

## 2017-08-13 MED FILL — AMLODIPINE BESYLATE 2.5 MG: 2.5 | 30 days supply | Qty: 30 | Fill #3

## 2017-08-16 DIAGNOSIS — G4733 Obstructive sleep apnea (adult) (pediatric): Secondary | ICD-10-CM | POA: Diagnosis not present

## 2017-08-29 DIAGNOSIS — I5032 Chronic diastolic (congestive) heart failure: Secondary | ICD-10-CM | POA: Diagnosis not present

## 2017-08-29 DIAGNOSIS — I5041 Acute combined systolic (congestive) and diastolic (congestive) heart failure: Secondary | ICD-10-CM | POA: Diagnosis not present

## 2017-08-29 DIAGNOSIS — I159 Secondary hypertension, unspecified: Secondary | ICD-10-CM | POA: Diagnosis not present

## 2017-09-05 ENCOUNTER — Encounter: Payer: Self-pay | Admitting: Neurology

## 2017-09-05 ENCOUNTER — Ambulatory Visit (INDEPENDENT_AMBULATORY_CARE_PROVIDER_SITE_OTHER): Payer: 59 | Admitting: Neurology

## 2017-09-05 VITALS — BP 137/67 | HR 63 | Ht 68.0 in | Wt 341.0 lb

## 2017-09-05 DIAGNOSIS — G4733 Obstructive sleep apnea (adult) (pediatric): Secondary | ICD-10-CM | POA: Diagnosis not present

## 2017-09-05 DIAGNOSIS — G43009 Migraine without aura, not intractable, without status migrainosus: Secondary | ICD-10-CM | POA: Diagnosis not present

## 2017-09-05 DIAGNOSIS — Z6841 Body Mass Index (BMI) 40.0 and over, adult: Secondary | ICD-10-CM

## 2017-09-05 DIAGNOSIS — Z9989 Dependence on other enabling machines and devices: Secondary | ICD-10-CM

## 2017-09-05 MED ORDER — BUTALBITAL-APAP-CAFFEINE 50-325-40 MG PO TABS: 1.0000 | ORAL_TABLET | Freq: Four times a day (QID) | ORAL | 0 refills | Status: DC | PRN

## 2017-09-05 NOTE — Patient Instructions (Addendum)
Please continue using your CPAP regularly. While your insurance requires that you use CPAP at least 4 hours each night on 70% of the nights, I recommend, that you not skip any nights and use it throughout the night if you can. Getting used to CPAP and staying with the treatment long term does take time and patience and discipline. Untreated obstructive sleep apnea when it is moderate to severe can have an adverse impact on cardiovascular health and raise her risk for heart disease, arrhythmias, hypertension, congestive heart failure, stroke and diabetes. Untreated obstructive sleep apnea causes sleep disruption, nonrestorative sleep, and sleep deprivation. This can have an impact on your day to day functioning and cause daytime sleepiness and impairment of cognitive function, memory loss, mood disturbance, and problems focussing. Using CPAP regularly can improve these symptoms.  Please allow for 7 to 8 AM hours of sleep each night.   You can try Melatonin at night for sleep: take 1 mg to 3 mg, one to 2 hours before your bedtime. You can go up to 5 mg if needed. It is over the counter and comes in pill form, chewable form and spray, if you prefer.    We can try Fioricet for as needed use, however, we will do a one-time prescription with no refills. Please be aware that this is to be used cautiously as it can be addicting and sedating. It is not for daily use.   As discussed, we will request a referral to medical weight loss clinic, Dr. Dalbert Garnet.   Keep up the good work! I will see you back in 6 months for sleep apnea check up, and if you continue to do well on CPAP I will see you once a year thereafter.

## 2017-09-05 NOTE — Progress Notes (Signed)
Subjective:    Patient ID: Amber Bentley is a 41 y.o. female.  HPI      Interim history:   Amber Bentley is a 41 year old right-handed woman with an underlying medical history of hypertension, recent smoking cessation in March 2018, chest pain, hypertensive cardiomyopathy with heart failure, LE edema, pulm hypertension and morbid obesity with a BMI over 45, who presents for follow-up consultation of her obstructive sleep apnea, after her recent sleep studies. The patient is unaccompanied today. I first met her on 04/30/2017 at the request of her primary care provider, at which time she reported witnessed apneas, daytime somnolence, snoring, and oxygen desaturations during sleep. She also had complaints of nocturia and morning headaches.  I suggested we proceed with sleep study testing. She had a baseline sleep study, as well as a CPAP titration study. I went over her test results with her in detail today. Her baseline sleep study from 05/24/2017 showed a sleep latency of 10 minutes, sleep efficiency 94.5%, REM latency 82.5 minutes. She had an increased percentage of slow-wave sleep and REM sleep was near normal at 18.2%. She had a total AHI in the mild range of 9.6 per hour, REM AHI was severe at 49 per hour, supine AHI was 9.9 per hour, average oxygen saturation was 97%, nadir was 69% during REM sleep. Time below 89% saturation was 54 minutes. She had no significant PLMS. Given her medical history and severe desaturations during REM sleep I suggested she proceed with a full night CPAP titration study. She had this on 06/14/2017. Sleep efficiency was 85.5%, sleep latency 11 minutes, REM latency 53 minutes. She had a normal percentage of REM sleep at 21.6%. She was fitted with nasal pillows and CPAP was titrated from 5 cm to 9 cm. On the final pressure her AHI was 0 per hour, O2 nadir of 86% with nonsupine REM sleep achieved. Based on her test results I suggested we proceed with CPAP treatment at home at a  pressure of 10 cm.  Today, 09/05/2017: I reviewed her CPAP compliance data from 08/05/2017 through 09/03/2017 which is a total of 30 days, during which time she used her CPAP every night with percent used days greater than 4 hours at 87%, indicating very good compliance with an average usage of 6 hours and 1 minute, residual AHI low at 0.2 per hour, leak low with the 95th percentile at 5.4 L/m on a pressure of 10 cm with EPR of 3. She reports having had some recurrent headaches. She also had a nasal infection which was deemed to be a staph infection. She received an appointment for this. She is using extra small nasal pillows. She is compliant with treatment but does not notice much in the way of improvement in her daytime somnolence. She saw her cardiologist last month and was encouraged to work on weight loss. Otherwise, she was doing well, blood pressure under good control. She is struggling with her weight. She is interested in a referral to weight loss management.  Previously:  04/30/2017: (She) reports snoring and excessive daytime somnolence, as well as witnessed apneas, O2 desaturations in sleep, a FHx of OSA in her father, nocturia and AH HAs. I reviewed your office note from 04/01/2017, which you kindly included. She has been followed by cardiology for her CHF.  Of note, she has been to the emergency room recently twice. On 04/14/2017 she presented with several episodes of vomiting. On 04/27/2017 she presented with chest pain. Her Epworth sleepiness score is  19 out of 24, her fatigue score is 47 out of 63. She reports that she has been noted to have breathing pauses while asleep, as witnessed during her hospitalization recently. She was placed on oxygen while in the hospital and still uses oxygen at night.  Her BT is around 10-11 PM and WT is 6:45 for her work and to wake up kids, ages 82 and 60.  Her father had OSA and a CPAP machine, he passed away at age 21. She gained weight in the past 5  years and started losing weight again. She works as a Aeronautical engineer. She quit smoking in 3/18, drinks alcohol very infrequently, and about 20 oz of coffee per day, maybe less.   Her Past Medical History Is Significant For: Past Medical History:  Diagnosis Date  . Anxiety   . CHF (congestive heart failure) ([place])   . Depression     Her Past Surgical History Is Significant For: Past Surgical History:  Procedure Laterality Date  . ABDOMINAL HYSTERECTOMY      Her Family History Is Significant For: Family History  Problem Relation Age of Onset  . Hypertension Mother   . Cancer Mother        breast cancer  . Diabetes Father   . Heart disease Father 38  . Hyperlipidemia Father   . Hypertension Father   . Depression Father   . Thyroid disease Sister   . Sudden death Brother   . Cancer Maternal Aunt        breast cancer  . Cancer Paternal Grandfather        brain cancer    Her Social History Is Significant For: Social History   Social History  . Marital status: Married    Spouse name: N/A  . Number of children: 2  . Years of education: MA   Occupational History  . [employer]    Social History Main Topics  . Smoking status: Former Smoker    Packs/day: 0.25    Years: 10.00    Types: Cigarettes  . Smokeless tobacco: Never Used  . Alcohol use Yes     Comment: rarely  . Drug use: No  . Sexual activity: Not Asked   Other Topics Concern  . None   Social History Narrative   Drinks coffee daily     Her Allergies Are:  Allergies  Allergen Reactions  . 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   Tori Milks [Naproxen] Other (See Comments)    Pt states that this medication makes her loopy.   :   Her Current Medications Are:  Outpatient Encounter Prescriptions as of 09/05/2017  Medication Sig  . carvedilol (COREG) 12.5 MG tablet Take 1 tablet (12.5 mg total) by mouth 2 (two) times daily with a meal.  . lisinopril  (PRINIVIL,ZESTRIL) 20 MG tablet Take 1 tablet (20 mg total) by mouth daily.  . pantoprazole (PROTONIX) 40 MG tablet Take 1 tablet (40 mg total) by mouth 2 (two) times daily.  [name] amLODipine (NORVASC) 2.5 MG tablet Take 1 tablet (2.5 mg total) by mouth daily.  . butalbital-acetaminophen-caffeine (FIORICET, ESGIC) 50-325-40 MG tablet Take 1-2 tablets by mouth every 6 (six) hours as needed for headache or migraine. (Patient not taking: Reported on 09/05/2017)  . rosuvastatin (CRESTOR) 10 MG tablet Take 1 tablet (10 mg total) by mouth daily.  [name] spironolactone (ALDACTONE) 25 MG tablet Take 1 tablet (25 mg total) by mouth daily.  . [  DISCONTINUED] fluticasone (FLONASE) 50 MCG/ACT nasal spray Place 2 sprays into both nostrils daily.   No facility-administered encounter medications on file as of 09/05/2017.   :  Review of Systems:  Out of a complete 14 point review of systems, all are reviewed and negative with the exception of these symptoms as listed below: Review of Systems  Neurological:       Pt presents today to discuss her cpap. Pt does not notice much of a difference after using cpap. Pt is asking for an RX for fioricet to help with cluster headaches.    Objective:  Neurological Exam  Physical Exam Physical Examination:   Vitals:   09/05/17 0914  BP: 137/67  Pulse: 63   General Examination: The patient is a very pleasant 41 y.o. female in no acute distress. She appears well-developed and well-nourished and well groomed.   HEENT: Normocephalic, atraumatic, pupils are equal, round and reactive to light and accommodation. Funduscopic exam is normal with sharp disc margins noted. Extraocular tracking is good without limitation to gaze excursion or nystagmus noted. Normal smooth pursuit is noted. Hearing is grossly intact. Face is symmetric with normal facial animation and normal facial sensation. Speech is clear with no dysarthria noted. There is no hypophonia. There is no lip, neck/head, jaw  or voice tremor. Neck is supple with full range of passive and active motion. There are no carotid bruits on auscultation. Oropharynx exam reveals: mild mouth dryness, Mild pharyngeal erythema, adequate dental hygiene and mild airway crowding, due to smaller airway entry and tonsils in place. Mallampati is class I. Tongue protrudes centrally and palate elevates symmetrically. Tonsils are 1+ in size. Nasal inspection reveals no significant nasal mucosal bogginess or redness and no septal deviation, no rash or sore or swelling.   Chest: Clear to auscultation without wheezing, rhonchi or crackles noted.  Heart: S1+S2+0, regular and normal without murmurs, rubs or gallops noted.   Abdomen: Soft, non-tender and non-distended with normal bowel sounds appreciated on auscultation.  Extremities: There is trace edema in the distal lower extremities bilaterally. Pedal pulses are intact.  Skin: Warm and dry without trophic changes noted.  Musculoskeletal: exam reveals no obvious joint deformities, tenderness or joint swelling or erythema.   Neurologically:  Mental status: The patient is awake, alert and oriented in all 4 spheres. Her immediate and remote memory, attention, language skills and fund of knowledge are appropriate. There is no evidence of aphasia, agnosia, apraxia or anomia. Speech is clear with normal prosody and enunciation. Thought process is linear. Mood is normal and affect is normal.  Cranial nerves II - XII are as described above under HEENT exam. In addition: shoulder shrug is normal with equal shoulder height noted. Motor exam: Normal bulk, strength and tone is noted. There is no drift, tremor or rebound. Romberg is negative. Reflexes are 1+ throughout. Fine motor skills and coordination: intact grossly. Cerebellar testing: No dysmetria or intention tremor. There is no truncal or gait ataxia.  Sensory exam: intact to light touch in the upper and lower extremities.  Gait, station  and balance: She stands easily. No veering to one side is noted. No leaning to one side is noted. Posture is age-appropriate and stance is narrow based. Gait shows normal stride length and normal pace. No problems turning are noted. Tandem walk is unremarkable.       Assessment and Plan:  In summary, Embrie Mikkelsen is a very pleasant 41 year old female with an underlying medical history of hypertension, recent smoking  cessation in March 2018, chest pain, hypertensive cardiomyopathy with heart failure, LE edema, pulm hypertension and morbid obesity with a BMI over 50, who presents for follow-up consultation of her obstructive sleep apnea, now well established on CPAP therapy. We talked about her baseline sleep study results from early June 2018 as well as her CPAP titration study results from late June 2018 at length today and reviewed her compliance data. She is compliant with treatment and commended for this. She has some recurrent headaches and was given a prescription for Fioricet by her primary care provider, reports that she lost the prescription. I will provide a one-time prescription for her. In addition I suggested referral to medical weight loss clinic, she is agreeable. She is commended for her treatment adherence. For sleep onset difficulties she can try over-the-counter melatonin. I suggested a 6 month follow-up, sooner as needed. I answered all her questions today and the patient was in agreement.  I spent 25 minutes in total face-to-face time with the patient, more than 50% of which was spent in counseling and coordination of care, reviewing test results, reviewing medication and discussing or reviewing the diagnosis of OSA, its prognosis and treatment options. Pertinent laboratory and imaging test results that were available during this visit with the patient were reviewed by me and considered in my medical decision making (see chart for details).

## 2017-09-15 DIAGNOSIS — G4733 Obstructive sleep apnea (adult) (pediatric): Secondary | ICD-10-CM | POA: Diagnosis not present

## 2017-09-17 ENCOUNTER — Encounter: Payer: Self-pay | Admitting: Nurse Practitioner

## 2017-09-25 MED FILL — AMLODIPINE BESYLATE 2.5 MG: 2.5 | 30 days supply | Qty: 30 | Fill #4

## 2017-09-25 MED FILL — LISINOPRIL 20 MG TAB: 20 | 90 days supply | Qty: 90 | Fill #1

## 2017-09-25 MED FILL — ROSUVASTATIN CALCIUM 10 MG: 10 | 90 days supply | Qty: 90 | Fill #4

## 2017-09-28 DIAGNOSIS — I5041 Acute combined systolic (congestive) and diastolic (congestive) heart failure: Secondary | ICD-10-CM | POA: Diagnosis not present

## 2017-09-28 DIAGNOSIS — I159 Secondary hypertension, unspecified: Secondary | ICD-10-CM | POA: Diagnosis not present

## 2017-09-28 DIAGNOSIS — I5032 Chronic diastolic (congestive) heart failure: Secondary | ICD-10-CM | POA: Diagnosis not present

## 2017-10-16 DIAGNOSIS — G4733 Obstructive sleep apnea (adult) (pediatric): Secondary | ICD-10-CM | POA: Diagnosis not present

## 2017-10-29 DIAGNOSIS — I159 Secondary hypertension, unspecified: Secondary | ICD-10-CM | POA: Diagnosis not present

## 2017-10-29 DIAGNOSIS — I5041 Acute combined systolic (congestive) and diastolic (congestive) heart failure: Secondary | ICD-10-CM | POA: Diagnosis not present

## 2017-10-29 DIAGNOSIS — I5032 Chronic diastolic (congestive) heart failure: Secondary | ICD-10-CM | POA: Diagnosis not present

## 2017-11-21 ENCOUNTER — Ambulatory Visit (INDEPENDENT_AMBULATORY_CARE_PROVIDER_SITE_OTHER): Payer: 59 | Admitting: Behavioral Health

## 2017-11-21 DIAGNOSIS — Z23 Encounter for immunization: Secondary | ICD-10-CM

## 2017-11-21 MED FILL — CARVEDILOL 12.5 MG TABS: 12.5 | 90 days supply | Qty: 180 | Fill #0

## 2017-11-21 MED FILL — PANTOPRAZOLE SOD DR 40 MG T: 40 | 90 days supply | Qty: 180 | Fill #1

## 2017-11-21 MED FILL — AMLODIPINE 2.5 MG TABLET: 2.5 | 30 days supply | Qty: 30 | Fill #5

## 2017-11-21 MED FILL — SPIRONOLACTONE 25 MG TABLET: 25 | 90 days supply | Qty: 90 | Fill #2

## 2017-11-21 NOTE — Progress Notes (Signed)
Patient came in clinic today for influenza vaccination. IM injection was given in the left deltoid. Patient tolerated the injection well. No s/s of a reaction prior to patient leaving the nurse visit.

## 2017-11-28 DIAGNOSIS — I159 Secondary hypertension, unspecified: Secondary | ICD-10-CM | POA: Diagnosis not present

## 2017-11-28 DIAGNOSIS — I5041 Acute combined systolic (congestive) and diastolic (congestive) heart failure: Secondary | ICD-10-CM | POA: Diagnosis not present

## 2017-11-28 DIAGNOSIS — I5032 Chronic diastolic (congestive) heart failure: Secondary | ICD-10-CM | POA: Diagnosis not present

## 2017-12-13 ENCOUNTER — Ambulatory Visit: Payer: 59 | Admitting: Nurse Practitioner

## 2017-12-19 ENCOUNTER — Encounter (INDEPENDENT_AMBULATORY_CARE_PROVIDER_SITE_OTHER): Payer: 59

## 2017-12-29 DIAGNOSIS — I5041 Acute combined systolic (congestive) and diastolic (congestive) heart failure: Secondary | ICD-10-CM | POA: Diagnosis not present

## 2017-12-29 DIAGNOSIS — I159 Secondary hypertension, unspecified: Secondary | ICD-10-CM | POA: Diagnosis not present

## 2017-12-29 DIAGNOSIS — I5032 Chronic diastolic (congestive) heart failure: Secondary | ICD-10-CM | POA: Diagnosis not present

## 2017-12-30 ENCOUNTER — Ambulatory Visit (INDEPENDENT_AMBULATORY_CARE_PROVIDER_SITE_OTHER): Payer: 59 | Admitting: Family Medicine

## 2017-12-30 ENCOUNTER — Encounter (INDEPENDENT_AMBULATORY_CARE_PROVIDER_SITE_OTHER): Payer: Self-pay | Admitting: Family Medicine

## 2017-12-30 VITALS — BP 142/75 | HR 63 | Temp 97.8°F | Ht 67.0 in | Wt 351.0 lb

## 2017-12-30 DIAGNOSIS — Z0289 Encounter for other administrative examinations: Secondary | ICD-10-CM

## 2017-12-30 DIAGNOSIS — R5383 Other fatigue: Secondary | ICD-10-CM

## 2017-12-30 DIAGNOSIS — R0602 Shortness of breath: Secondary | ICD-10-CM | POA: Diagnosis not present

## 2017-12-30 DIAGNOSIS — Z1331 Encounter for screening for depression: Secondary | ICD-10-CM | POA: Diagnosis not present

## 2017-12-30 DIAGNOSIS — Z6841 Body Mass Index (BMI) 40.0 and over, adult: Secondary | ICD-10-CM | POA: Diagnosis not present

## 2017-12-30 DIAGNOSIS — R7303 Prediabetes: Secondary | ICD-10-CM | POA: Diagnosis not present

## 2017-12-30 DIAGNOSIS — I5042 Chronic combined systolic (congestive) and diastolic (congestive) heart failure: Secondary | ICD-10-CM

## 2017-12-30 NOTE — Progress Notes (Signed)
.  Office: [phone]  /  Fax: [phone]   HPI:   Chief Complaint: OBESITY  Amber Bentley (MR# [id number]) is a 43 y.o. female who presents on 12/30/2017 for obesity evaluation and treatment. Current BMI is Body mass index is 54.97 kg/m.[name] Amber Bentley has struggled with obesity for years and has been unsuccessful in either losing weight or maintaining long term weight loss. Amber Bentley attended our information session and states she is currently in the action stage of change and ready to dedicate time achieving and maintaining a healthier weight.  Amber Bentley states her family eats meals together she thinks her family will eat healthier with  her her desired weight loss is 66 lbs she has been heavy most of  her life she started gaining weight at the age of 12(ish) her heaviest weight ever was approximately 375 lbs. she has significant food cravings issues  she snacks frequently in the evenings she skips meals frequently she is frequently drinking liquids with calories she frequently makes poor food choices she has problems with excessive hunger  she frequently eats larger portions than normal  she has binge eating behaviors she struggles with emotional eating    Fatigue Amber Bentley feels her energy is lower than it should be. This has worsened with weight gain and has not worsened recently. Amber Bentley admits to daytime somnolence and  admits to waking up still tired. Patient has a diagnosis of obstructive sleep apnea which may be contributing to his fatigue. Patent has a history of symptoms of daytime fatigue, morning fatigue, morning headache and hypertension. Patient generally gets 6 to 8 hours of sleep per night, and states they generally have restless sleep. Snoring was present before CPAP. Apneic episodes are present. Epworth Sleepiness Score is 19  Dyspnea on exertion [name] notes increasing shortness of breath with exercising and seems to be worsening over time with weight gain. She notes getting out of  breath sooner with activity than she used to. This has not gotten worse recently. Amber Bentley denies orthopnea.  Pre-Diabetes Amber Bentley has a diagnosis of pre-diabetes based on a strong family history of diabetes and she has had a history of elevated fasting blood sugar. She was informed this puts her at greater risk of developing diabetes. She is not taking metformin currently and is attempting to work on diet and exercise to decrease risk of diabetes. She admits polyphagia and denies polyuria or polydipsia.  Congestive Heart Failure, Systolic and Diastolic Dysfunction Amber Bentley was hospitalized approximately 2 years ago and diuresed 60 lbs. She is no longer on Lasix, but is on Spironolactone, Ace inhibitor, Beta Blocker and Statin.  Hypertension Amber Bentley is a 42 y.o. female with hypertension. Her blood pressure is elevated today at 142/75 and she is fating, so she didn't take her medications. Amber Bentley states her blood pressure is normally controlled. Amber Bentley denies chest pain or shortness of breath on exertion. She is working weight loss to help control her blood pressure with the goal of decreasing her risk of heart attack and stroke. Amber Bentley blood pressure is not currently controlled.  Depression Screen Amber Bentley Food and Mood (modified PHQ-9) score was  Depression screen PHQ 2/9 12/30/2017  Decreased Interest 3  Down, Depressed, Hopeless 3  PHQ - 2 Score 6  Altered sleeping 3  Tired, decreased energy 3  Change in appetite 3  Feeling bad or failure about yourself  3  Trouble concentrating 3  Moving slowly or fidgety/restless 3  Suicidal thoughts 3  PHQ-9 Score 27  Difficult doing  work/chores Somewhat difficult    ALLERGIES: Allergies  Allergen Reactions  . 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   [name] [Naproxen] Other (See Comments)    Pt states that this medication makes her loopy.     MEDICATIONS: Current Outpatient Medications on File Prior to Visit    Medication Sig Dispense Refill  . amLODipine (NORVASC) 2.5 MG tablet Take 1 tablet (2.5 mg total) by mouth daily. 30 tablet 11  . carvedilol (COREG) 12.5 MG tablet Take 1 tablet (12.5 mg total) by mouth 2 (two) times daily with a meal. 60 tablet 3  . lisinopril (PRINIVIL,ZESTRIL) 20 MG tablet Take 1 tablet (20 mg total) by mouth daily. 90 tablet 1  . pantoprazole (PROTONIX) 40 MG tablet Take 1 tablet (40 mg total) by mouth 2 (two) times daily. 180 tablet 1  . rosuvastatin (CRESTOR) 10 MG tablet Take 1 tablet (10 mg total) by mouth daily. 90 tablet 3  . spironolactone (ALDACTONE) 25 MG tablet Take 1 tablet (25 mg total) by mouth daily. 90 tablet 3   No current facility-administered medications on file prior to visit.     PAST MEDICAL HISTORY: Past Medical History:  Diagnosis Date  . Anemia   . Anxiety   . Cardiomegaly   . Chest pain   . CHF (congestive heart failure) (HCC)   . Constipation   . Depression   . Dyspnea   . Fatigue   . Food allergy    celery  . GERD (gastroesophageal reflux disease)   . Hip pain   . HLD (hyperlipidemia)   . HTN (hypertension)   . Infertility, female   . Lactose intolerance   . Leg edema   . Lower back pain   . OSA on CPAP   . Palpitations   . Prediabetes   . Shoulder pain   . Stomach ulcer   . Varicose vein of leg     PAST SURGICAL HISTORY: Past Surgical History:  Procedure Laterality Date  . ABDOMINAL HYSTERECTOMY    . ENDOMETRIAL ABLATION  2007   prior to hysterectomy    SOCIAL HISTORY: Social History   Tobacco Use  . Smoking status: Former Smoker    Packs/day: 0.25    Years: 10.00    Pack years: 2.50    Types: Cigarettes  . Smokeless tobacco: Never Used  Substance Use Topics  . Alcohol use: Yes    Comment: rarely  . Drug use: No    FAMILY HISTORY: Family History  Problem Relation Age of Onset  . Hypertension Mother   . Cancer Mother        breast cancer  . Kidney disease Mother   . Depression Mother   . Obesity  Mother   . Diabetes Father   . Heart disease Father 59  . Hyperlipidemia Father   . Hypertension Father   . Depression Father   . Anxiety disorder Father   . Sleep apnea Father   . Obesity Father   . Thyroid disease Sister   . Sudden death Brother   . Cancer Maternal Aunt        breast cancer  . Cancer Paternal Grandfather        brain cancer    ROS: Review of Systems  Constitutional: Positive for malaise/fatigue.       Breasts Pain   HENT: Positive for congestion (nasal stuffiness).   Eyes:       Wear Glasses or  Contacts Floaters  Respiratory: Positive for cough and shortness of breath.        Painful or Difficulty Breathing  Cardiovascular: Positive for chest pain (chest pain/discomfort) and palpitations. Negative for orthopnea.       Chest Tightness Shortness of Breath with Activity Difficulty Breathing while Lying Down Sudden Awakening from Sleep with Shortness of Breath Calf/Leg Pain with Walking Leg Cramping  Gastrointestinal: Positive for constipation, heartburn and nausea.  Genitourinary: Positive for frequency.  Musculoskeletal: Positive for back pain and neck pain.       Neck stiffness Muscle Stiffness  Skin: Positive for itching.       Dryness   Neurological: Positive for dizziness, weakness and headaches.  Endo/Heme/Allergies: Negative for polydipsia. Bruises/bleeds easily (easy bruising).       Positive polyphagia   Psychiatric/Behavioral: Positive for depression. The patient is nervous/anxious (nervousness) and has insomnia.        Stress    PHYSICAL EXAM: Blood pressure (!) 142/75, pulse 63, temperature 97.8 F (36.6 C), temperature source Oral, height 5\' 7"  (1.702 m), weight (!) 351 lb (159.2 kg), SpO2 96 %. Body mass index is 54.97 kg/m. Physical Exam  Constitutional: She is oriented to person, place, and time. She appears well-developed and well-nourished.  HENT:  Head: Normocephalic and atraumatic.  Eyes: EOM are normal. No scleral  icterus.  Neck: Normal range of motion. Neck supple. No thyromegaly present.  Cardiovascular: Normal rate and regular rhythm.  Pulmonary/Chest: Effort normal. No respiratory distress.  Abdominal: Soft. There is no tenderness.  + obesity  Musculoskeletal: Normal range of motion. She exhibits no edema.  Range of Motion normal in all 4 extremities  Neurological: She is alert and oriented to person, place, and time. Coordination normal.  Skin: Skin is warm and dry.  Psychiatric: She has a normal mood and affect. Her behavior is normal.  Vitals reviewed.   RECENT LABS AND TESTS: BMET    Component Value Date/Time   NA 140 06/20/2017 0856   NA 143 04/08/2017 1124   K 4.7 06/20/2017 0856   CL 101 06/20/2017 0856   CO2 28 06/20/2017 0856   GLUCOSE 113 (H) 06/20/2017 0856   BUN 23 06/20/2017 0856   BUN 24 (H) 04/08/2017 1124   CREATININE 1.19 06/20/2017 0856   CALCIUM 9.3 06/20/2017 0856   GFRNONAA 57 (L) 04/27/2017 2055   GFRAA >60 04/27/2017 2055   Lab Results  Component Value Date   HGBA1C 5.8 02/21/2017   No results found for: INSULIN CBC    Component Value Date/Time   WBC 9.6 06/20/2017 0856   RBC 5.04 06/20/2017 0856   HGB 13.7 06/20/2017 0856   HCT 41.9 06/20/2017 0856   PLT 224.0 06/20/2017 0856   MCV 83.2 06/20/2017 0856   MCH 24.1 (L) 04/27/2017 2055   MCHC 32.7 06/20/2017 0856   RDW 14.6 06/20/2017 0856   LYMPHSABS 1.3 02/21/2017 1510   MONOABS 0.6 02/21/2017 1510   EOSABS 0.1 02/21/2017 1510   BASOSABS 0.1 02/21/2017 1510   Iron/TIBC/Ferritin/ %Sat    Component Value Date/Time   IRON 85 06/20/2017 0856   TIBC 344 06/20/2017 0856   FERRITIN 55.4 06/20/2017 0856   IRONPCTSAT 25 06/20/2017 0856   Lipid Panel     Component Value Date/Time   CHOL 204 (H) 04/08/2017 1124   TRIG 54 04/08/2017 1124   HDL 61 04/08/2017 1124   CHOLHDL 3.3 04/08/2017 1124   CHOLHDL 4.7 02/22/2017 0420   VLDL 11 02/22/2017 0420  LDLCALC 132 (H) 04/08/2017 1124   Hepatic  Function Panel     Component Value Date/Time   PROT 7.0 04/14/2017 0315   PROT 7.2 04/08/2017 1124   ALBUMIN 3.8 04/14/2017 0315   ALBUMIN 4.3 04/08/2017 1124   AST 30 04/14/2017 0315   ALT 23 04/14/2017 0315   ALKPHOS 72 04/14/2017 0315   BILITOT 1.1 04/14/2017 0315   BILITOT 0.3 04/08/2017 1124   BILIDIR 0.4 04/14/2017 0315   IBILI 0.7 04/14/2017 0315      Component Value Date/Time   TSH 3.01 06/20/2017 0856   Vitamin D There are no available results  ECG  shows NSR with a rate of 68 BPM INDIRECT CALORIMETER done today shows a VO2 of 300 and a REE of 2094. Her calculated basal metabolic rate is 4196 thus her basal metabolic rate is worse than expected.    ASSESSMENT AND PLAN: Other fatigue - Plan: EKG 12-Lead, Vitamin B12, CBC With Differential, Folate, T3, T4, free, TSH, VITAMIN D 25 Hydroxy (Vit-D Deficiency, Fractures)  Shortness of breath on exertion - Plan: CBC With Differential  Prediabetes - Plan: Comprehensive metabolic panel, Hemoglobin A1c, Insulin, random  Chronic combined systolic and diastolic congestive heart failure (HCC) - Plan: Lipid Panel With LDL/HDL Ratio  Depression screening  Class 3 severe obesity with serious comorbidity and body mass index (BMI) of 50.0 to 59.9 in adult, unspecified obesity type (HCC)  PLAN:  Fatigue Karaleigh was informed that her fatigue may be related to obesity, depression or many other causes. Labs will be ordered, and in the meanwhile Lakicia has agreed to work on diet, exercise and weight loss to help with fatigue. Proper sleep hygiene was discussed including the need for 7-8 hours of quality sleep each night. A sleep study was not ordered based on symptoms and Epworth score.  Dyspnea on exertion Caelie's shortness of breath appears to be obesity related and exercise induced. She has agreed to work on weight loss and gradually increase exercise to treat her exercise induced shortness of breath. If Babbie follows our  instructions and loses weight without improvement of her shortness of breath, we will plan to refer to pulmonology. We will monitor this condition regularly. Amber Bentley agrees to this plan.  Pre-Diabetes Amber Bentley will continue to work on weight loss, exercise, and decreasing simple carbohydrates in her diet to help decrease the risk of diabetes. She agrees to follow a low simple carbohydrate diet. She was informed that eating too many simple carbohydrates or too many calories at one sitting increases the likelihood of GI side effects. We will check labs and Amber Bentley agreed to follow up with [name] as directed to monitor her progress.  Congestive Heart Failure, Systolic and Diastolic Dysfunction Amber Bentley was advised to work on intensive lifestyle modifications and we discussed diet and weight loss in depth today. She will work on decreasing her sodium intake and we will follow closely.  Hypertension We discussed sodium restriction, working on healthy weight loss, and a regular exercise program as the means to achieve improved blood pressure control. Amber Bentley agreed with this plan and agreed to follow up as directed. We will continue to monitor her blood pressure as well as her progress with the above lifestyle modifications. We will check labs and she will continue her medications as prescribed and will watch for signs of hypotension as she continues her lifestyle modifications.  Depression Screen Amber Bentley had a strongly positive depression screening. Depression is commonly associated with obesity and often results in emotional eating  behaviors. We will monitor this closely and work on CBT to help improve the non-hunger eating patterns. Referral to Psychology may be required if no improvement is seen as she continues in our clinic.  Obesity Amber Bentley is currently in the action stage of change and her goal is to continue with weight loss efforts She has agreed to follow the Category 3 plan Amber Bentley has been instructed to work up to  a goal of 150 minutes of combined cardio and strengthening exercise per week for weight loss and overall health benefits. We discussed the following Behavioral Modification Strategies today: increasing lean protein intake, decreasing simple carbohydrates  and work on meal planning and easy cooking plans  Anayah has agreed to follow up with our clinic in 2 weeks. She was informed of the importance of frequent follow up visits to maximize her success with intensive lifestyle modifications for her multiple health conditions. She was informed we would discuss her lab results at her next visit unless there is a critical issue that needs to be addressed sooner. Amber Bentley agreed to keep her next visit at the agreed upon time to discuss these results.    OBESITY BEHAVIORAL INTERVENTION VISIT  Today's visit was # 1 out of 22.  Starting weight: 351 lbs Starting date: 12/30/17 Today's weight : 351 lbs  Today's date: 12/30/2017 Total lbs lost to date: 0 (Patients must lose 7 lbs in the first 6 months to continue with counseling)   ASK: We discussed the diagnosis of obesity with Amber Bentley today and Asiya agreed to give [name] permission to discuss obesity behavioral modification therapy today.  ASSESS: Jaslynne has the diagnosis of obesity and her BMI today is 54.96 Shanetta is in the action stage of change   ADVISE: Stashia was educated on the multiple health risks of obesity as well as the benefit of weight loss to improve her health. She was advised of the need for long term treatment and the importance of lifestyle modifications.  AGREE: Multiple dietary modification options and treatment options were discussed and  Tyreanna agreed to the above obesity treatment plan.   I, [place] Crane, am acting as transcriptionist for Quillian Quince, MD   I have reviewed the above documentation for accuracy and completeness, and I agree with the above. -Quillian Quince, MD

## 2018-01-01 LAB — CBC WITH DIFFERENTIAL
Basophils Absolute: 0 10*3/uL (ref 0.0–0.2)
Basos: 0 %
EOS (ABSOLUTE): 0.2 10*3/uL (ref 0.0–0.4)
EOS: 2 %
HEMATOCRIT: 43.1 % (ref 34.0–46.6)
Hemoglobin: 13.4 g/dL (ref 11.1–15.9)
IMMATURE GRANS (ABS): 0 10*3/uL (ref 0.0–0.1)
Immature Granulocytes: 0 %
LYMPHS: 21 %
Lymphocytes Absolute: 2 10*3/uL (ref 0.7–3.1)
MCH: 25.9 pg — AB (ref 26.6–33.0)
MCHC: 31.1 g/dL — ABNORMAL LOW (ref 31.5–35.7)
MCV: 83 fL (ref 79–97)
MONOS ABS: 0.6 10*3/uL (ref 0.1–0.9)
Monocytes: 6 %
NEUTROS ABS: 6.5 10*3/uL (ref 1.4–7.0)
Neutrophils: 71 %
RBC: 5.18 x10E6/uL (ref 3.77–5.28)
RDW: 13.6 % (ref 12.3–15.4)
WBC: 9.3 10*3/uL (ref 3.4–10.8)

## 2018-01-01 LAB — COMPREHENSIVE METABOLIC PANEL
A/G RATIO: 1.6 (ref 1.2–2.2)
ALT: 28 IU/L (ref 0–32)
AST: 24 IU/L (ref 0–40)
Albumin: 4.5 g/dL (ref 3.5–5.5)
Alkaline Phosphatase: 70 IU/L (ref 39–117)
BILIRUBIN TOTAL: 0.3 mg/dL (ref 0.0–1.2)
BUN / CREAT RATIO: 20 (ref 9–23)
BUN: 17 mg/dL (ref 6–20)
CALCIUM: 9.3 mg/dL (ref 8.7–10.2)
CO2: 23 mmol/L (ref 20–29)
Chloride: 102 mmol/L (ref 96–106)
Creatinine, Ser: 0.87 mg/dL (ref 0.57–1.00)
GFR, EST AFRICAN AMERICAN: 97 mL/min/{1.73_m2} (ref >59)
GFR, EST NON AFRICAN AMERICAN: 84 mL/min/{1.73_m2} (ref >59)
GLOBULIN, TOTAL: 2.8 g/dL (ref 1.5–4.5)
Glucose: 108 mg/dL — ABNORMAL HIGH (ref 65–99)
POTASSIUM: 4.9 mmol/L (ref 3.5–5.2)
SODIUM: 141 mmol/L (ref 134–144)
TOTAL PROTEIN: 7.3 g/dL (ref 6.0–8.5)

## 2018-01-01 LAB — VITAMIN B12: Vitamin B-12: 476 pg/mL (ref 232–1245)

## 2018-01-01 LAB — LIPID PANEL WITH LDL/HDL RATIO
Cholesterol, Total: 120 mg/dL (ref 100–199)
HDL: 45 mg/dL (ref >39)
LDL CALC: 62 mg/dL (ref 0–99)
LDL/HDL RATIO: 1.4 ratio (ref 0.0–3.2)
TRIGLYCERIDES: 65 mg/dL (ref 0–149)
VLDL Cholesterol Cal: 13 mg/dL (ref 5–40)

## 2018-01-01 LAB — FOLATE: Folate: 7 ng/mL (ref >3.0)

## 2018-01-01 LAB — TSH: TSH: 1.27 u[IU]/mL (ref 0.450–4.500)

## 2018-01-01 LAB — VITAMIN D 25 HYDROXY (VIT D DEFICIENCY, FRACTURES): VIT D 25 HYDROXY: 11 ng/mL — AB (ref 30.0–100.0)

## 2018-01-01 LAB — HEMOGLOBIN A1C
Est. average glucose Bld gHb Est-mCnc: 126 mg/dL
Hgb A1c MFr Bld: 6 % — ABNORMAL HIGH (ref 4.8–5.6)

## 2018-01-01 LAB — T3: T3, Total: 168 ng/dL (ref 71–180)

## 2018-01-01 LAB — INSULIN, RANDOM: INSULIN: 89.7 u[IU]/mL — ABNORMAL HIGH (ref 2.6–24.9)

## 2018-01-01 LAB — T4, FREE: Free T4: 0.93 ng/dL (ref 0.82–1.77)

## 2018-01-14 ENCOUNTER — Ambulatory Visit (INDEPENDENT_AMBULATORY_CARE_PROVIDER_SITE_OTHER): Payer: 59 | Admitting: Family Medicine

## 2018-01-14 VITALS — BP 113/69 | HR 61 | Temp 97.6°F | Ht 67.0 in | Wt 339.0 lb

## 2018-01-14 DIAGNOSIS — R7303 Prediabetes: Secondary | ICD-10-CM | POA: Diagnosis not present

## 2018-01-14 DIAGNOSIS — Z6841 Body Mass Index (BMI) 40.0 and over, adult: Secondary | ICD-10-CM | POA: Diagnosis not present

## 2018-01-14 DIAGNOSIS — Z9189 Other specified personal risk factors, not elsewhere classified: Secondary | ICD-10-CM

## 2018-01-14 DIAGNOSIS — E119 Type 2 diabetes mellitus without complications: Secondary | ICD-10-CM | POA: Insufficient documentation

## 2018-01-14 DIAGNOSIS — E559 Vitamin D deficiency, unspecified: Secondary | ICD-10-CM | POA: Diagnosis not present

## 2018-01-14 MED ORDER — VITAMIN D (ERGOCALCIFEROL) 1.25 MG (50000 UNIT) PO CAPS: 50000.0000 [IU] | ORAL_CAPSULE | ORAL | 0 refills | Status: DC

## 2018-01-14 MED ORDER — METFORMIN HCL 500 MG PO TABS
500.0000 mg | ORAL_TABLET | Freq: Every day | ORAL | 0 refills | Status: DC
Start: 2018-01-14 — End: 2018-02-25

## 2018-01-14 MED FILL — VIT D2 1.25 MG (50,000 UNIT: 1.25 MG | 28 days supply | Qty: 4 | Fill #0

## 2018-01-14 MED FILL — metFORMIN HCL 500 MG TABS: 500 | 30 days supply | Qty: 30 | Fill #0

## 2018-01-14 NOTE — Progress Notes (Signed)
Office: [phone]  /  Fax: [phone]   HPI:   Chief Complaint: OBESITY Kjirsten is here to discuss her progress with her obesity treatment plan. She is on the  follow the Category 3 plan and is following her eating plan approximately 80 % of the time. She is not currently exercising.  Ayde is doing very well with weight loss. She tolerated the Cat 3 plan well, but struggles to eat all of her protein for dinner. She also struggled with sabotage at home from her wife.  Her weight is (!) 339 lb (153.8 kg) today and has had a weight loss of 12 pounds over a period of 2 weeks since her last visit. She has lost 12 lbs since starting treatment with [name].  Vitamin D deficiency Lastarr has a diagnosis of vitamin D deficiency. She is not currently taking vit D. Rubani admits to being fatigue, but denies nausea, vomiting or muscle weakness.  Ref. Range 12/30/2017 11:47  Vitamin D, 25-Hydroxy Latest Ref Range: 30.0 - 100.0 ng/mL 11.0 (L)    Pre-Diabetes Shukrona has a diagnosis of prediabetes based on her elevated HgA1c at 6.1 and fasting insulin very increased. Yanett was informed this puts her at greater risk of developing diabetes. She is not taking metformin currently and continues to work on diet and exercise to decrease risk of diabetes. She denies nausea or hypoglycemia.  Diabetes II Khelani has a diagnosis of diabetes type II. Antaniya states patient does not check sugars and denies any hypoglycemic episodes. Last A1c was Hemoglobin A1C Latest Ref Rng & Units 12/30/2017 02/21/2017  HGBA1C 4.8 - 5.6 % 6.0(H) 5.8  Some recent data might be hidden    She has been working on intensive lifestyle modifications including diet, exercise, and weight loss to help control her blood glucose levels.  ALLERGIES: Allergies  Allergen Reactions  . 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   [name] [Naproxen] Other (See Comments)    Pt states that this medication makes her loopy.      MEDICATIONS: Current Outpatient Medications on File Prior to Visit  Medication Sig Dispense Refill  . amLODipine (NORVASC) 2.5 MG tablet Take 1 tablet (2.5 mg total) by mouth daily. 30 tablet 11  . carvedilol (COREG) 12.5 MG tablet Take 1 tablet (12.5 mg total) by mouth 2 (two) times daily with a meal. 60 tablet 3  . lisinopril (PRINIVIL,ZESTRIL) 20 MG tablet Take 1 tablet (20 mg total) by mouth daily. 90 tablet 1  . pantoprazole (PROTONIX) 40 MG tablet Take 1 tablet (40 mg total) by mouth 2 (two) times daily. 180 tablet 1  . rosuvastatin (CRESTOR) 10 MG tablet Take 1 tablet (10 mg total) by mouth daily. 90 tablet 3  . spironolactone (ALDACTONE) 25 MG tablet Take 1 tablet (25 mg total) by mouth daily. 90 tablet 3   No current facility-administered medications on file prior to visit.     PAST MEDICAL HISTORY: Past Medical History:  Diagnosis Date  . Anemia   . Anxiety   . Cardiomegaly   . Chest pain   . CHF (congestive heart failure) (HCC)   . Constipation   . Depression   . Dyspnea   . Fatigue   . Food allergy    celery  . GERD (gastroesophageal reflux disease)   . Hip pain   . HLD (hyperlipidemia)   . HTN (hypertension)   . Infertility, female   . Lactose intolerance   .  Leg edema   . Lower back pain   . OSA on CPAP   . Palpitations   . Prediabetes   . Shoulder pain   . Stomach ulcer   . Varicose vein of leg     PAST SURGICAL HISTORY: Past Surgical History:  Procedure Laterality Date  . ABDOMINAL HYSTERECTOMY    . ENDOMETRIAL ABLATION  2007   prior to hysterectomy    SOCIAL HISTORY: Social History   Tobacco Use  . Smoking status: Former Smoker    Packs/day: 0.25    Years: 10.00    Pack years: 2.50    Types: Cigarettes  . Smokeless tobacco: Never Used  Substance Use Topics  . Alcohol use: Yes    Comment: rarely  . Drug use: No    FAMILY HISTORY: Family History  Problem Relation Age of Onset  . Hypertension Mother   . Cancer Mother         breast cancer  . Kidney disease Mother   . Depression Mother   . Obesity Mother   . Diabetes Father   . Heart disease Father 62  . Hyperlipidemia Father   . Hypertension Father   . Depression Father   . Anxiety disorder Father   . Sleep apnea Father   . Obesity Father   . Thyroid disease Sister   . Sudden death Brother   . Cancer Maternal Aunt        breast cancer  . Cancer Paternal Grandfather        brain cancer    ROS: Review of Systems  Constitutional: Positive for malaise/fatigue and weight loss.  Gastrointestinal: Negative for nausea and vomiting.  Musculoskeletal:       Negative for muscle weakness   Endo/Heme/Allergies:       Positive polyphagia  Negative hypoglycemia     PHYSICAL EXAM: Blood pressure 113/69, pulse 61, temperature 97.6 F (36.4 C), temperature source Oral, height 5\' 7"  (1.702 m), weight (!) 339 lb (153.8 kg), SpO2 100 %. Body mass index is 53.09 kg/m. Physical Exam  Constitutional: She is oriented to person, place, and time. She appears well-developed and well-nourished.  HENT:  Head: Normocephalic.  Eyes: EOM are normal.  Neck: Normal range of motion.  Cardiovascular: Normal rate.  Pulmonary/Chest: Effort normal.  Musculoskeletal: Normal range of motion.  Neurological: She is alert and oriented to person, place, and time.  Skin: Skin is warm and dry.  Psychiatric: She has a normal mood and affect. Her behavior is normal.  Vitals reviewed.   RECENT LABS AND TESTS: BMET    Component Value Date/Time   NA 141 12/30/2017 1147   K 4.9 12/30/2017 1147   CL 102 12/30/2017 1147   CO2 23 12/30/2017 1147   GLUCOSE 108 (H) 12/30/2017 1147   GLUCOSE 113 (H) 06/20/2017 0856   BUN 17 12/30/2017 1147   CREATININE 0.87 12/30/2017 1147   CALCIUM 9.3 12/30/2017 1147   GFRNONAA 84 12/30/2017 1147   GFRAA 97 12/30/2017 1147   Lab Results  Component Value Date   HGBA1C 6.0 (H) 12/30/2017   HGBA1C 5.8 02/21/2017   Lab Results  Component  Value Date   INSULIN 89.7 (H) 12/30/2017   CBC    Component Value Date/Time   WBC 9.3 12/30/2017 1147   WBC 9.6 06/20/2017 0856   RBC 5.18 12/30/2017 1147   RBC 5.04 06/20/2017 0856   HGB 13.4 12/30/2017 1147   HCT 43.1 12/30/2017 1147   PLT 224.0 06/20/2017 0856  MCV 83 12/30/2017 1147   MCH 25.9 (L) 12/30/2017 1147   MCH 24.1 (L) 04/27/2017 2055   MCHC 31.1 (L) 12/30/2017 1147   MCHC 32.7 06/20/2017 0856   RDW 13.6 12/30/2017 1147   LYMPHSABS 2.0 12/30/2017 1147   MONOABS 0.6 02/21/2017 1510   EOSABS 0.2 12/30/2017 1147   BASOSABS 0.0 12/30/2017 1147   Iron/TIBC/Ferritin/ %Sat    Component Value Date/Time   IRON 85 06/20/2017 0856   TIBC 344 06/20/2017 0856   FERRITIN 55.4 06/20/2017 0856   IRONPCTSAT 25 06/20/2017 0856   Lipid Panel     Component Value Date/Time   CHOL 120 12/30/2017 1147   TRIG 65 12/30/2017 1147   HDL 45 12/30/2017 1147   CHOLHDL 3.3 04/08/2017 1124   CHOLHDL 4.7 02/22/2017 0420   VLDL 11 02/22/2017 0420   LDLCALC 62 12/30/2017 1147   Hepatic Function Panel     Component Value Date/Time   PROT 7.3 12/30/2017 1147   ALBUMIN 4.5 12/30/2017 1147   AST 24 12/30/2017 1147   ALT 28 12/30/2017 1147   ALKPHOS 70 12/30/2017 1147   BILITOT 0.3 12/30/2017 1147   BILIDIR 0.4 04/14/2017 0315   IBILI 0.7 04/14/2017 0315      Component Value Date/Time   TSH 1.270 12/30/2017 1147   TSH 3.01 06/20/2017 0856   TSH 4.78 (H) 02/21/2017 1510     Ref. Range 12/30/2017 11:47  Vitamin D, 25-Hydroxy Latest Ref Range: 30.0 - 100.0 ng/mL 11.0 (L)    ASSESSMENT AND PLAN: Vitamin D deficiency - Plan: Vitamin D, Ergocalciferol, (DRISDOL) 50000 units CAPS capsule  Prediabetes - Plan: metFORMIN (GLUCOPHAGE) 500 MG tablet  At risk for diabetes mellitus  Class 3 severe obesity with serious comorbidity and body mass index (BMI) of 50.0 to 59.9 in adult, unspecified obesity type (HCC)  PLAN:  Vitamin D Deficiency Tyra was informed that low vitamin D  levels contributes to fatigue and are associated with obesity, breast, and colon cancer. She agrees to start to take prescription Vit D @50 ,000 IU every week #4 with no refills and will follow up for routine testing of vitamin D, at least 2-3 times per year. She was informed of the risk of over-replacement of vitamin D and agrees to not increase her dose unless she discusses this with [name] first. Ranee agrees to follow up with our clinic in 2 weeks  Pre-Diabetes Dominga will continue to work on weight loss, exercise, and decreasing simple carbohydrates in her diet to help decrease the risk of diabetes. We dicussed metformin including benefits and risks. She was informed that eating too many simple carbohydrates or too many calories at one sitting increases the likelihood of GI side effects. Emmah will start metformin 500 mg every morning #30 with no refills. Taite agreed to follow up with [name] as directed to monitor her progress.  Diabetes risk counselling Arabell was given extended (30 minutes) diabetes prevention counseling today. She is 42 y.o. female and has risk factors for diabetes including obesity. We discussed intensive lifestyle modifications today with an emphasis on weight loss as well as increasing exercise and decreasing simple carbohydrates in her diet.   Obesity Rebekka is currently in the action stage of change. As such, her goal is to continue with weight loss efforts She has agreed to follow the Category 3 plan Sherida has been instructed to work up to a goal of 150 minutes of combined cardio and strengthening exercise per week for weight loss and overall health benefits.  We discussed the following Behavioral Modification Stratagies today: decreasing simple carbohydrates , increasing vegetables and work on meal planning and easy cooking plans   Yuliet has agreed to follow up with our clinic in 2 weeks. She was informed of the importance of frequent follow up visits to maximize her success with  intensive lifestyle modifications for her multiple health conditions.   OBESITY BEHAVIORAL INTERVENTION VISIT  Today's visit was # 2 out of 22.  Starting weight: 351 lbs  Starting date: 12/30/17 Today's weight : 339 lb Today's date: 01/14/2018 Total lbs lost to date: 12 (Patients must lose 7 lbs in the first 6 months to continue with counseling)   ASK: We discussed the diagnosis of obesity with Beverly Sessions today and Shakina agreed to give [name] permission to discuss obesity behavioral modification therapy today.  ASSESS: Josely has the diagnosis of obesity and her BMI today is 53.08 Arnita is in the action stage of change   ADVISE: Dove was educated on the multiple health risks of obesity as well as the benefit of weight loss to improve her health. She was advised of the need for long term treatment and the importance of lifestyle modifications.  AGREE: Multiple dietary modification options and treatment options were discussed and  Takaya agreed to the above obesity treatment plan.  I, Jeralene Peters, am acting as transcriptionist for Quillian Quince, MD  I have reviewed the above documentation for accuracy and completeness, and I agree with the above. -Quillian Quince, MD

## 2018-01-28 ENCOUNTER — Ambulatory Visit (INDEPENDENT_AMBULATORY_CARE_PROVIDER_SITE_OTHER): Payer: 59 | Admitting: Family Medicine

## 2018-01-28 VITALS — BP 107/72 | HR 65 | Temp 98.4°F | Ht 67.0 in | Wt 331.0 lb

## 2018-01-28 DIAGNOSIS — R7303 Prediabetes: Secondary | ICD-10-CM | POA: Diagnosis not present

## 2018-01-28 DIAGNOSIS — Z6841 Body Mass Index (BMI) 40.0 and over, adult: Secondary | ICD-10-CM

## 2018-01-28 NOTE — Progress Notes (Signed)
Office: [phone]  /  Fax: [phone]   HPI:   Chief Complaint: OBESITY Amber Bentley is here to discuss her progress with her obesity treatment plan. She is on the Category 3 plan and is following her eating plan approximately 99 % of the time. She states she is swimming for 45 minutes 2 times per week. Amber Bentley is doing very well with weight loss on her category 3 plan. She has increased lower sugar fruit and hunger is controlled. She is getting sabotage from her wife, but her kids are starting to eat healthier with her. Her weight is (!) 331 lb (150.1 kg) today and has had a weight loss of 8 pounds over a period of 2 weeks since her last visit. She has lost 20 lbs since starting treatment with [name].  Pre-Diabetes Amber Bentley has a diagnosis of pre-diabetes based on her elevated Hgb A1c and was informed this puts her at greater risk of developing diabetes. She is tolerating metformin well and she denies any GI upset. She denies nausea, vomiting or hypoglycemia. Amber Bentley notes decreased polyphagia and is doing well on her diet prescription. Amber Bentley continues to work on diet and exercise to decrease risk of diabetes.   ALLERGIES: Allergies  Allergen Reactions  . 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   [name] [Naproxen] Other (See Comments)    Pt states that this medication makes her loopy.     MEDICATIONS: Current Outpatient Medications on File Prior to Visit  Medication Sig Dispense Refill  . carvedilol (COREG) 12.5 MG tablet Take 1 tablet (12.5 mg total) by mouth 2 (two) times daily with a meal. 60 tablet 3  . lisinopril (PRINIVIL,ZESTRIL) 20 MG tablet Take 1 tablet (20 mg total) by mouth daily. 90 tablet 1  . metFORMIN (GLUCOPHAGE) 500 MG tablet Take 1 tablet (500 mg total) by mouth daily with breakfast. 30 tablet 0  . pantoprazole (PROTONIX) 40 MG tablet Take 1 tablet (40 mg total) by mouth 2 (two) times daily. 180 tablet 1  . Vitamin D, Ergocalciferol, (DRISDOL)  50000 units CAPS capsule Take 1 capsule (50,000 Units total) by mouth every 7 (seven) days. 4 capsule 0  . amLODipine (NORVASC) 2.5 MG tablet Take 1 tablet (2.5 mg total) by mouth daily. 30 tablet 11  . rosuvastatin (CRESTOR) 10 MG tablet Take 1 tablet (10 mg total) by mouth daily. 90 tablet 3  . spironolactone (ALDACTONE) 25 MG tablet Take 1 tablet (25 mg total) by mouth daily. 90 tablet 3   No current facility-administered medications on file prior to visit.     PAST MEDICAL HISTORY: Past Medical History:  Diagnosis Date  . Anemia   . Anxiety   . Cardiomegaly   . Chest pain   . CHF (congestive heart failure) (HCC)   . Constipation   . Depression   . Dyspnea   . Fatigue   . Food allergy    celery  . GERD (gastroesophageal reflux disease)   . Hip pain   . HLD (hyperlipidemia)   . HTN (hypertension)   . Infertility, female   . Lactose intolerance   . Leg edema   . Lower back pain   . OSA on CPAP   . Palpitations   . Prediabetes   . Shoulder pain   . Stomach ulcer   . Varicose vein of leg     PAST SURGICAL HISTORY: Past Surgical History:  Procedure Laterality Date  . ABDOMINAL HYSTERECTOMY    .  ENDOMETRIAL ABLATION  2007   prior to hysterectomy    SOCIAL HISTORY: Social History   Tobacco Use  . Smoking status: Former Smoker    Packs/day: 0.25    Years: 10.00    Pack years: 2.50    Types: Cigarettes  . Smokeless tobacco: Never Used  Substance Use Topics  . Alcohol use: Yes    Comment: rarely  . Drug use: No    FAMILY HISTORY: Family History  Problem Relation Age of Onset  . Hypertension Mother   . Cancer Mother        breast cancer  . Kidney disease Mother   . Depression Mother   . Obesity Mother   . Diabetes Father   . Heart disease Father 58  . Hyperlipidemia Father   . Hypertension Father   . Depression Father   . Anxiety disorder Father   . Sleep apnea Father   . Obesity Father   . Thyroid disease Sister   . Sudden death Brother   .  Cancer Maternal Aunt        breast cancer  . Cancer Paternal Grandfather        brain cancer    ROS: Review of Systems  Constitutional: Positive for weight loss.  Gastrointestinal: Negative for nausea and vomiting.  Endo/Heme/Allergies:       Negative for hypoglycemia Positive for polyphagia    PHYSICAL EXAM: Blood pressure 107/72, pulse 65, temperature 98.4 F (36.9 C), temperature source Oral, height 5\' 7"  (1.702 m), weight (!) 331 lb (150.1 kg), SpO2 98 %. Body mass index is 51.84 kg/m. Physical Exam  Constitutional: She is oriented to person, place, and time. She appears well-developed and well-nourished.  Cardiovascular: Normal rate.  Pulmonary/Chest: Effort normal.  Musculoskeletal: Normal range of motion.  Neurological: She is oriented to person, place, and time.  Skin: Skin is warm and dry.  Psychiatric: She has a normal mood and affect. Her behavior is normal.  Vitals reviewed.   RECENT LABS AND TESTS: BMET    Component Value Date/Time   NA 141 12/30/2017 1147   K 4.9 12/30/2017 1147   CL 102 12/30/2017 1147   CO2 23 12/30/2017 1147   GLUCOSE 108 (H) 12/30/2017 1147   GLUCOSE 113 (H) 06/20/2017 0856   BUN 17 12/30/2017 1147   CREATININE 0.87 12/30/2017 1147   CALCIUM 9.3 12/30/2017 1147   GFRNONAA 84 12/30/2017 1147   GFRAA 97 12/30/2017 1147   Lab Results  Component Value Date   HGBA1C 6.0 (H) 12/30/2017   HGBA1C 5.8 02/21/2017   Lab Results  Component Value Date   INSULIN 89.7 (H) 12/30/2017   CBC    Component Value Date/Time   WBC 9.3 12/30/2017 1147   WBC 9.6 06/20/2017 0856   RBC 5.18 12/30/2017 1147   RBC 5.04 06/20/2017 0856   HGB 13.4 12/30/2017 1147   HCT 43.1 12/30/2017 1147   PLT 224.0 06/20/2017 0856   MCV 83 12/30/2017 1147   MCH 25.9 (L) 12/30/2017 1147   MCH 24.1 (L) 04/27/2017 2055   MCHC 31.1 (L) 12/30/2017 1147   MCHC 32.7 06/20/2017 0856   RDW 13.6 12/30/2017 1147   LYMPHSABS 2.0 12/30/2017 1147   MONOABS 0.6  02/21/2017 1510   EOSABS 0.2 12/30/2017 1147   BASOSABS 0.0 12/30/2017 1147   Iron/TIBC/Ferritin/ %Sat    Component Value Date/Time   IRON 85 06/20/2017 0856   TIBC 344 06/20/2017 0856   FERRITIN 55.4 06/20/2017 0856   IRONPCTSAT 25  06/20/2017 0856   Lipid Panel     Component Value Date/Time   CHOL 120 12/30/2017 1147   TRIG 65 12/30/2017 1147   HDL 45 12/30/2017 1147   CHOLHDL 3.3 04/08/2017 1124   CHOLHDL 4.7 02/22/2017 0420   VLDL 11 02/22/2017 0420   LDLCALC 62 12/30/2017 1147   Hepatic Function Panel     Component Value Date/Time   PROT 7.3 12/30/2017 1147   ALBUMIN 4.5 12/30/2017 1147   AST 24 12/30/2017 1147   ALT 28 12/30/2017 1147   ALKPHOS 70 12/30/2017 1147   BILITOT 0.3 12/30/2017 1147   BILIDIR 0.4 04/14/2017 0315   IBILI 0.7 04/14/2017 0315      Component Value Date/Time   TSH 1.270 12/30/2017 1147   TSH 3.01 06/20/2017 0856   TSH 4.78 (H) 02/21/2017 1510    ASSESSMENT AND PLAN: Prediabetes  Class 3 severe obesity with serious comorbidity and body mass index (BMI) of 50.0 to 59.9 in adult, unspecified obesity type (HCC)  PLAN:  Pre-Diabetes Amber Bentley will continue to work on weight loss, exercise, and decreasing simple carbohydrates in her diet to help decrease the risk of diabetes. We dicussed metformin including benefits and risks. She was informed that eating too many simple carbohydrates or too many calories at one sitting increases the likelihood of GI side effects. Amber Bentley agrees to continue metformin for now and a prescription was not written today. We will recheck labs in 2 weeks and Amber Bentley agreed to follow up with [name] as directed to monitor her progress.  We spent > than 50% of the 15 minute visit on the counseling as documented in the note.  Obesity Amber Bentley is currently in the action stage of change. As such, her goal is to continue with weight loss efforts She has agreed to follow the Category 3 plan Amber Bentley has been instructed to work up to a  goal of 150 minutes of combined cardio and strengthening exercise per week or continue sweimming for 45 minutes 2 times per week for weight loss and overall health benefits. We discussed the following Behavioral Modification Strategies today: work on meal planning and easy cooking plans and dealing with family or coworker sabotage  Amber Bentley has agreed to follow up with our clinic in 2 weeks. She was informed of the importance of frequent follow up visits to maximize her success with intensive lifestyle modifications for her multiple health conditions.   OBESITY BEHAVIORAL INTERVENTION VISIT  Today's visit was # 3 out of 22.  Starting weight: 351 lbs Starting date: 12/30/17 Today's weight : 331 lbs Today's date: 01/28/2018 Total lbs lost to date: 20 (Patients must lose 7 lbs in the first 6 months to continue with counseling)   ASK: We discussed the diagnosis of obesity with Amber Bentley today and Amber Bentley agreed to give [name] permission to discuss obesity behavioral modification therapy today.  ASSESS: Amber Bentley has the diagnosis of obesity and her BMI today is 51.83 Amber Bentley is in the action stage of change   ADVISE: Amber Bentley was educated on the multiple health risks of obesity as well as the benefit of weight loss to improve her health. She was advised of the need for long term treatment and the importance of lifestyle modifications.  AGREE: Multiple dietary modification options and treatment options were discussed and  Amber Bentley agreed to the above obesity treatment plan.  I, [place] Crane, am acting as transcriptionist for Quillian Quince, MD  I have reviewed the above documentation for accuracy and completeness, and I  agree with the above. -Dennard Nip, MD

## 2018-01-29 DIAGNOSIS — I5041 Acute combined systolic (congestive) and diastolic (congestive) heart failure: Secondary | ICD-10-CM | POA: Diagnosis not present

## 2018-01-29 DIAGNOSIS — I159 Secondary hypertension, unspecified: Secondary | ICD-10-CM | POA: Diagnosis not present

## 2018-01-29 DIAGNOSIS — I5032 Chronic diastolic (congestive) heart failure: Secondary | ICD-10-CM | POA: Diagnosis not present

## 2018-01-31 ENCOUNTER — Other Ambulatory Visit: Payer: Self-pay | Admitting: Nurse Practitioner

## 2018-01-31 DIAGNOSIS — I10 Essential (primary) hypertension: Secondary | ICD-10-CM

## 2018-02-11 ENCOUNTER — Ambulatory Visit (INDEPENDENT_AMBULATORY_CARE_PROVIDER_SITE_OTHER): Payer: 59 | Admitting: Family Medicine

## 2018-02-11 VITALS — BP 135/82 | HR 65 | Temp 98.1°F | Ht 67.0 in | Wt 330.0 lb

## 2018-02-11 DIAGNOSIS — E559 Vitamin D deficiency, unspecified: Secondary | ICD-10-CM

## 2018-02-11 DIAGNOSIS — Z6841 Body Mass Index (BMI) 40.0 and over, adult: Secondary | ICD-10-CM | POA: Diagnosis not present

## 2018-02-11 DIAGNOSIS — Z9189 Other specified personal risk factors, not elsewhere classified: Secondary | ICD-10-CM

## 2018-02-11 DIAGNOSIS — I10 Essential (primary) hypertension: Secondary | ICD-10-CM

## 2018-02-11 MED ORDER — VITAMIN D (ERGOCALCIFEROL) 1.25 MG (50000 UNIT) PO CAPS: 50000.0000 [IU] | ORAL_CAPSULE | ORAL | 0 refills | Status: DC

## 2018-02-12 NOTE — Progress Notes (Signed)
Office: [phone]  /  Fax: [phone]   HPI:   Chief Complaint: OBESITY Amber Bentley is here to discuss her progress with her obesity treatment plan. She is on the Category 3 plan and is following her eating plan approximately 80 to 85 % of the time. She states she is swimming 60 minutes 3 to 5 times per week. Amber Bentley is getting slightly bored with lunch and is interested in options besides what's on the category 3 plan. Her weight is (!) 330 lb (149.7 kg) today and has had a weight loss of 1 pound over a period of 2 weeks since her last visit. She has lost 21 lbs since starting treatment with [name].  Vitamin D deficiency Amber Bentley has a diagnosis of vitamin D deficiency. She is currently taking vit D and denies nausea, vomiting or muscle weakness.   Ref. Range 12/30/2017 11:47  Vitamin D, 25-Hydroxy Latest Ref Range: 30.0 - 100.0 ng/mL 11.0 (L)    At risk for osteopenia and osteoporosis Amber Bentley is at higher risk of osteopenia and osteoporosis due to vitamin D deficiency.   Hypertension Amber Bentley is a 42 y.o. female with hypertension. Amber Bentley denies chest pain, chest pressure or headache. She is working weight loss to help control her blood pressure with the goal of decreasing her risk of heart attack and stroke. Amber Bentley blood pressure is controlled today.  ALLERGIES: Allergies  Allergen Reactions  . 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   [name] [Naproxen] Other (See Comments)    Pt states that this medication makes her loopy.     MEDICATIONS: Current Outpatient Medications on File Prior to Visit  Medication Sig Dispense Refill  . carvedilol (COREG) 12.5 MG tablet Take 1 tablet (12.5 mg total) by mouth 2 (two) times daily with a meal. 60 tablet 3  . lisinopril (PRINIVIL,ZESTRIL) 20 MG tablet TAKE 1 TABLET BY MOUTH ONCE DAILY 90 tablet 1  . metFORMIN (GLUCOPHAGE) 500 MG tablet Take 1 tablet (500 mg total) by mouth daily with breakfast. 30 tablet 0  .  pantoprazole (PROTONIX) 40 MG tablet Take 1 tablet (40 mg total) by mouth 2 (two) times daily. 180 tablet 1  . rosuvastatin (CRESTOR) 10 MG tablet Take 1 tablet (10 mg total) by mouth daily. 90 tablet 3  . spironolactone (ALDACTONE) 25 MG tablet Take 1 tablet (25 mg total) by mouth daily. 90 tablet 3  . amLODipine (NORVASC) 2.5 MG tablet Take 1 tablet (2.5 mg total) by mouth daily. 30 tablet 11   No current facility-administered medications on file prior to visit.     PAST MEDICAL HISTORY: Past Medical History:  Diagnosis Date  . Anemia   . Anxiety   . Cardiomegaly   . Chest pain   . CHF (congestive heart failure) (HCC)   . Constipation   . Depression   . Dyspnea   . Fatigue   . Food allergy    celery  . GERD (gastroesophageal reflux disease)   . Hip pain   . HLD (hyperlipidemia)   . HTN (hypertension)   . Infertility, female   . Lactose intolerance   . Leg edema   . Lower back pain   . OSA on CPAP   . Palpitations   . Prediabetes   . Shoulder pain   . Stomach ulcer   . Varicose vein of leg     PAST SURGICAL HISTORY: Past Surgical History:  Procedure Laterality Date  .  ABDOMINAL HYSTERECTOMY    . ENDOMETRIAL ABLATION  2007   prior to hysterectomy    SOCIAL HISTORY: Social History   Tobacco Use  . Smoking status: Former Smoker    Packs/day: 0.25    Years: 10.00    Pack years: 2.50    Types: Cigarettes  . Smokeless tobacco: Never Used  Substance Use Topics  . Alcohol use: Yes    Comment: rarely  . Drug use: No    FAMILY HISTORY: Family History  Problem Relation Age of Onset  . Hypertension Mother   . Cancer Mother        breast cancer  . Kidney disease Mother   . Depression Mother   . Obesity Mother   . Diabetes Father   . Heart disease Father 83  . Hyperlipidemia Father   . Hypertension Father   . Depression Father   . Anxiety disorder Father   . Sleep apnea Father   . Obesity Father   . Thyroid disease Sister   . Sudden death Brother     . Cancer Maternal Aunt        breast cancer  . Cancer Paternal Grandfather        brain cancer    ROS: Review of Systems  Constitutional: Positive for weight loss.  Cardiovascular: Negative for chest pain.       Negative for chest pressure  Gastrointestinal: Negative for nausea and vomiting.  Musculoskeletal:       Negative for muscle weakness  Neurological: Negative for headaches.    PHYSICAL EXAM: Blood pressure 135/82, pulse 65, temperature 98.1 F (36.7 C), temperature source Oral, height 5\' 7"  (1.702 m), weight (!) 330 lb (149.7 kg), SpO2 98 %. Body mass index is 51.69 kg/m. Physical Exam  Constitutional: She is oriented to person, place, and time. She appears well-developed and well-nourished.  Cardiovascular: Normal rate.  Pulmonary/Chest: Effort normal.  Musculoskeletal: Normal range of motion.  Neurological: She is oriented to person, place, and time.  Skin: Skin is warm and dry.  Psychiatric: She has a normal mood and affect. Her behavior is normal.  Vitals reviewed.   RECENT LABS AND TESTS: BMET    Component Value Date/Time   NA 141 12/30/2017 1147   K 4.9 12/30/2017 1147   CL 102 12/30/2017 1147   CO2 23 12/30/2017 1147   GLUCOSE 108 (H) 12/30/2017 1147   GLUCOSE 113 (H) 06/20/2017 0856   BUN 17 12/30/2017 1147   CREATININE 0.87 12/30/2017 1147   CALCIUM 9.3 12/30/2017 1147   GFRNONAA 84 12/30/2017 1147   GFRAA 97 12/30/2017 1147   Lab Results  Component Value Date   HGBA1C 6.0 (H) 12/30/2017   HGBA1C 5.8 02/21/2017   Lab Results  Component Value Date   INSULIN 89.7 (H) 12/30/2017   CBC    Component Value Date/Time   WBC 9.3 12/30/2017 1147   WBC 9.6 06/20/2017 0856   RBC 5.18 12/30/2017 1147   RBC 5.04 06/20/2017 0856   HGB 13.4 12/30/2017 1147   HCT 43.1 12/30/2017 1147   PLT 224.0 06/20/2017 0856   MCV 83 12/30/2017 1147   MCH 25.9 (L) 12/30/2017 1147   MCH 24.1 (L) 04/27/2017 2055   MCHC 31.1 (L) 12/30/2017 1147   MCHC 32.7  06/20/2017 0856   RDW 13.6 12/30/2017 1147   LYMPHSABS 2.0 12/30/2017 1147   MONOABS 0.6 02/21/2017 1510   EOSABS 0.2 12/30/2017 1147   BASOSABS 0.0 12/30/2017 1147   Iron/TIBC/Ferritin/ %Sat  Component Value Date/Time   IRON 85 06/20/2017 0856   TIBC 344 06/20/2017 0856   FERRITIN 55.4 06/20/2017 0856   IRONPCTSAT 25 06/20/2017 0856   Lipid Panel     Component Value Date/Time   CHOL 120 12/30/2017 1147   TRIG 65 12/30/2017 1147   HDL 45 12/30/2017 1147   CHOLHDL 3.3 04/08/2017 1124   CHOLHDL 4.7 02/22/2017 0420   VLDL 11 02/22/2017 0420   LDLCALC 62 12/30/2017 1147   Hepatic Function Panel     Component Value Date/Time   PROT 7.3 12/30/2017 1147   ALBUMIN 4.5 12/30/2017 1147   AST 24 12/30/2017 1147   ALT 28 12/30/2017 1147   ALKPHOS 70 12/30/2017 1147   BILITOT 0.3 12/30/2017 1147   BILIDIR 0.4 04/14/2017 0315   IBILI 0.7 04/14/2017 0315      Component Value Date/Time   TSH 1.270 12/30/2017 1147   TSH 3.01 06/20/2017 0856   TSH 4.78 (H) 02/21/2017 1510    ASSESSMENT AND PLAN: Vitamin D deficiency - Plan: Vitamin D, Ergocalciferol, (DRISDOL) 50000 units CAPS capsule  Essential hypertension  At risk for osteopenia  Class 3 severe obesity with serious comorbidity and body mass index (BMI) of 50.0 to 59.9 in adult, unspecified obesity type (HCC)  PLAN:  Vitamin D Deficiency Amber Bentley was informed that low vitamin D levels contributes to fatigue and are associated with obesity, breast, and colon cancer. She agrees to continue to take prescription Vit D @50 ,000 IU every week #4 with no refills and will follow up for routine testing of vitamin D, at least 2-3 times per year. She was informed of the risk of over-replacement of vitamin D and agrees to not increase her dose unless she discusses this with [name] first. Amber Bentley agrees to follow up with our clinic in 2 weeks.  At risk for osteopenia and osteoporosis Amber Bentley is at risk for osteopenia and osteoporosis due to her  vitamin D deficiency. She was encouraged to take her vitamin D and follow her higher calcium diet and increase strengthening exercise to help strengthen her bones and decrease her risk of osteopenia and osteoporosis.  Hypertension We discussed sodium restriction, working on healthy weight loss, and a regular exercise program as the means to achieve improved blood pressure control. Amber Bentley agreed with this plan and agreed to follow up as directed. We will monitor her blood pressure at the next visit and will continue to monitor her blood pressure as well as her progress with the above lifestyle modifications. She will continue her medications as prescribed and will watch for signs of hypotension as she continues her lifestyle modifications.  Obesity Amber Bentley is currently in the action stage of change. As such, her goal is to continue with weight loss efforts She has agreed to keep a food journal with 350 to 500 calories and 35 grams of protein at lunch daily and follow the Category 3 plan Amber Bentley has been instructed to work up to a goal of 150 minutes of combined cardio and strengthening exercise per week for weight loss and overall health benefits. We discussed the following Behavioral Modification Strategies today: better snacking choices, increasing lean protein intake and work on meal planning and easy cooking plans  Amber Bentley has agreed to follow up with our clinic in 2 weeks. She was informed of the importance of frequent follow up visits to maximize her success with intensive lifestyle modifications for her multiple health conditions.   OBESITY BEHAVIORAL INTERVENTION VISIT  Today's visit was # 4  out of 22.  Starting weight: 351 lbs Starting date: 12/30/17 Today's weight : 330 lbs Today's date: 02/11/2018 Total lbs lost to date: 21 (Patients must lose 7 lbs in the first 6 months to continue with counseling)   ASK: We discussed the diagnosis of obesity with Amber Bentley today and Mariea agreed to  give [name] permission to discuss obesity behavioral modification therapy today.  ASSESS: Brinda has the diagnosis of obesity and her BMI today is 51.67 Neera is in the action stage of change   ADVISE: Etosha was educated on the multiple health risks of obesity as well as the benefit of weight loss to improve her health. She was advised of the need for long term treatment and the importance of lifestyle modifications.  AGREE: Multiple dietary modification options and treatment options were discussed and  Cledith agreed to the above obesity treatment plan.  I, [place] Crane, am acting as transcriptionist for Debbra Riding, MD  I have reviewed the above documentation for accuracy and completeness, and I agree with the above. - Debbra Riding, MD

## 2018-02-25 ENCOUNTER — Ambulatory Visit (INDEPENDENT_AMBULATORY_CARE_PROVIDER_SITE_OTHER): Payer: 59 | Admitting: Family Medicine

## 2018-02-25 ENCOUNTER — Other Ambulatory Visit (INDEPENDENT_AMBULATORY_CARE_PROVIDER_SITE_OTHER): Payer: Self-pay | Admitting: Family Medicine

## 2018-02-25 ENCOUNTER — Other Ambulatory Visit: Payer: Self-pay | Admitting: Nurse Practitioner

## 2018-02-25 VITALS — BP 113/70 | HR 61 | Temp 98.3°F | Ht 67.0 in | Wt 324.0 lb

## 2018-02-25 DIAGNOSIS — R7303 Prediabetes: Secondary | ICD-10-CM

## 2018-02-25 DIAGNOSIS — I10 Essential (primary) hypertension: Secondary | ICD-10-CM | POA: Diagnosis not present

## 2018-02-25 DIAGNOSIS — Z9189 Other specified personal risk factors, not elsewhere classified: Secondary | ICD-10-CM | POA: Diagnosis not present

## 2018-02-25 DIAGNOSIS — Z6841 Body Mass Index (BMI) 40.0 and over, adult: Secondary | ICD-10-CM | POA: Diagnosis not present

## 2018-02-25 MED ORDER — METFORMIN HCL 500 MG PO TABS: 500.0000 mg | ORAL_TABLET | Freq: Every day | ORAL | 0 refills | Status: DC

## 2018-02-25 MED FILL — AMLODIPINE 2.5 MG TABLET: 2.5 | 90 days supply | Qty: 90 | Fill #6

## 2018-02-25 MED FILL — LISINOPRIL 20 MG TABLET: 20 | 90 days supply | Qty: 90 | Fill #0

## 2018-02-25 MED FILL — CARVEDILOL 12.5 MG TABS: 12.5 | 30 days supply | Qty: 60 | Fill #1

## 2018-02-25 MED FILL — ROSUVASTATIN CALCIUM 10 MG: 10 | 90 days supply | Qty: 90 | Fill #5

## 2018-02-25 MED FILL — VIT D2 1.25 MG (50,000 UNIT: 1.25 MG | 28 days supply | Qty: 4 | Fill #0

## 2018-02-25 MED FILL — metFORMIN HCL 500 MG TABS: 500 | 30 days supply | Qty: 30 | Fill #0

## 2018-02-25 MED FILL — SPIRONOLACTONE 25 MG TABLET: 25 | 30 days supply | Qty: 30 | Fill #3

## 2018-02-25 NOTE — Progress Notes (Signed)
Office: [phone]  /  Fax: [phone]   HPI:   Chief Complaint: OBESITY Amber Bentley is here to discuss her progress with her obesity treatment plan. She is on the keep a food journal with 350 to 500 calories and 35 grams of protein at lunch daily and the Category 3 plan and is following her eating plan approximately 95 % of the time. She states she is exercising 0 minutes 0 times per week. Amber Bentley had celebratory dinner for her 1 year anniversary of surviving congestive heart failure. She is alternating between journaling and the category 3 plan. Her weight is (!) 324 lb (147 kg) today and has had a weight loss of 6 pounds over a period of 2 weeks since her last visit. She has lost 27 lbs since starting treatment with [name].  Pre-Diabetes Amber Bentley has a diagnosis of pre-diabetes based on her elevated Hgb A1c and was informed this puts her at greater risk of developing diabetes. She is taking metformin currently and continues to work on diet and exercise to decrease risk of diabetes. She denies any cravings, nausea or hypoglycemia.  At risk for diabetes Amber Bentley is at higher than average risk for developing diabetes due to her obesity and pre-diabetes. She currently denies polyuria or polydipsia.  Hypertension Amber Bentley is a 42 y.o. female with hypertension. She is on medications for congestive heart failure and hypertension. Amber Bentley denies chest pain, chest Bentley or headache. She is working weight loss to help control her blood Bentley with the goal of decreasing her risk of heart attack and stroke. Amber Bentley is controlled today.  ALLERGIES: Allergies  Allergen Reactions  . 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   [name] [Naproxen] Other (See Comments)    Pt states that this medication makes her loopy.     MEDICATIONS: Current Outpatient Medications on File Prior to Visit  Medication Sig Dispense Refill  . carvedilol (COREG) 12.5 MG tablet  Take 1 tablet (12.5 mg total) by mouth 2 (two) times daily with a meal. 60 tablet 3  . lisinopril (PRINIVIL,ZESTRIL) 20 MG tablet TAKE 1 TABLET BY MOUTH ONCE DAILY 90 tablet 1  . metFORMIN (GLUCOPHAGE) 500 MG tablet Take 1 tablet (500 mg total) by mouth daily with breakfast. 30 tablet 0  . pantoprazole (PROTONIX) 40 MG tablet Take 1 tablet (40 mg total) by mouth 2 (two) times daily. 180 tablet 1  . Vitamin D, Ergocalciferol, (DRISDOL) 50000 units CAPS capsule Take 1 capsule (50,000 Units total) by mouth every 7 (seven) days. 4 capsule 0  . amLODipine (NORVASC) 2.5 MG tablet Take 1 tablet (2.5 mg total) by mouth daily. 30 tablet 11  . rosuvastatin (CRESTOR) 10 MG tablet Take 1 tablet (10 mg total) by mouth daily. 90 tablet 3  . spironolactone (ALDACTONE) 25 MG tablet Take 1 tablet (25 mg total) by mouth daily. 90 tablet 3   No current facility-administered medications on file prior to visit.     PAST MEDICAL HISTORY: Past Medical History:  Diagnosis Date  . Anemia   . Anxiety   . Cardiomegaly   . Chest pain   . CHF (congestive heart failure) (HCC)   . Constipation   . Depression   . Dyspnea   . Fatigue   . Food allergy    celery  . GERD (gastroesophageal reflux disease)   . Hip pain   . HLD (hyperlipidemia)   . HTN (hypertension)   .  Infertility, female   . Lactose intolerance   . Leg edema   . Lower back pain   . OSA on CPAP   . Palpitations   . Prediabetes   . Shoulder pain   . Stomach ulcer   . Varicose vein of leg     PAST SURGICAL HISTORY: Past Surgical History:  Procedure Laterality Date  . ABDOMINAL HYSTERECTOMY    . ENDOMETRIAL ABLATION  2007   prior to hysterectomy    SOCIAL HISTORY: Social History   Tobacco Use  . Smoking status: Former Smoker    Packs/day: 0.25    Years: 10.00    Pack years: 2.50    Types: Cigarettes  . Smokeless tobacco: Never Used  Substance Use Topics  . Alcohol use: Yes    Comment: rarely  . Drug use: No    FAMILY  HISTORY: Family History  Problem Relation Age of Onset  . Hypertension Mother   . Cancer Mother        breast cancer  . Kidney disease Mother   . Depression Mother   . Obesity Mother   . Diabetes Father   . Heart disease Father 65  . Hyperlipidemia Father   . Hypertension Father   . Depression Father   . Anxiety disorder Father   . Sleep apnea Father   . Obesity Father   . Thyroid disease Sister   . Sudden death Brother   . Cancer Maternal Aunt        breast cancer  . Cancer Paternal Grandfather        brain cancer    ROS: Review of Systems  Constitutional: Positive for weight loss.  Cardiovascular: Negative for chest pain.       Negative for chest Bentley  Gastrointestinal: Negative for nausea.  Genitourinary: Negative for frequency.  Neurological: Negative for headaches.  Endo/Heme/Allergies: Negative for polydipsia.       Negative for hypoglycemia    PHYSICAL EXAM: Blood Bentley 113/70, pulse 61, temperature 98.3 F (36.8 C), temperature source Oral, height 5\' 7"  (1.702 m), weight (!) 324 lb (147 kg), SpO2 98 %. Body mass index is 50.75 kg/m. Physical Exam  Constitutional: She is oriented to person, place, and time. She appears well-developed and well-nourished.  Cardiovascular: Normal rate.  Pulmonary/Chest: Effort normal.  Musculoskeletal: Normal range of motion.  Neurological: She is oriented to person, place, and time.  Skin: Skin is warm and dry.  Psychiatric: She has a normal mood and affect. Her behavior is normal.  Vitals reviewed.   RECENT LABS AND TESTS: BMET    Component Value Date/Time   NA 141 12/30/2017 1147   K 4.9 12/30/2017 1147   CL 102 12/30/2017 1147   CO2 23 12/30/2017 1147   GLUCOSE 108 (H) 12/30/2017 1147   GLUCOSE 113 (H) 06/20/2017 0856   BUN 17 12/30/2017 1147   CREATININE 0.87 12/30/2017 1147   CALCIUM 9.3 12/30/2017 1147   GFRNONAA 84 12/30/2017 1147   GFRAA 97 12/30/2017 1147   Lab Results  Component Value Date    HGBA1C 6.0 (H) 12/30/2017   HGBA1C 5.8 02/21/2017   Lab Results  Component Value Date   INSULIN 89.7 (H) 12/30/2017   CBC    Component Value Date/Time   WBC 9.3 12/30/2017 1147   WBC 9.6 06/20/2017 0856   RBC 5.18 12/30/2017 1147   RBC 5.04 06/20/2017 0856   HGB 13.4 12/30/2017 1147   HCT 43.1 12/30/2017 1147   PLT 224.0 06/20/2017 0856  MCV 83 12/30/2017 1147   MCH 25.9 (L) 12/30/2017 1147   MCH 24.1 (L) 04/27/2017 2055   MCHC 31.1 (L) 12/30/2017 1147   MCHC 32.7 06/20/2017 0856   RDW 13.6 12/30/2017 1147   LYMPHSABS 2.0 12/30/2017 1147   MONOABS 0.6 02/21/2017 1510   EOSABS 0.2 12/30/2017 1147   BASOSABS 0.0 12/30/2017 1147   Iron/TIBC/Ferritin/ %Sat    Component Value Date/Time   IRON 85 06/20/2017 0856   TIBC 344 06/20/2017 0856   FERRITIN 55.4 06/20/2017 0856   IRONPCTSAT 25 06/20/2017 0856   Lipid Panel     Component Value Date/Time   CHOL 120 12/30/2017 1147   TRIG 65 12/30/2017 1147   HDL 45 12/30/2017 1147   CHOLHDL 3.3 04/08/2017 1124   CHOLHDL 4.7 02/22/2017 0420   VLDL 11 02/22/2017 0420   LDLCALC 62 12/30/2017 1147   Hepatic Function Panel     Component Value Date/Time   PROT 7.3 12/30/2017 1147   ALBUMIN 4.5 12/30/2017 1147   AST 24 12/30/2017 1147   ALT 28 12/30/2017 1147   ALKPHOS 70 12/30/2017 1147   BILITOT 0.3 12/30/2017 1147   BILIDIR 0.4 04/14/2017 0315   IBILI 0.7 04/14/2017 0315      Component Value Date/Time   TSH 1.270 12/30/2017 1147   TSH 3.01 06/20/2017 0856   TSH 4.78 (H) 02/21/2017 1510    ASSESSMENT AND PLAN: Prediabetes - Plan: metFORMIN (GLUCOPHAGE) 500 MG tablet  Essential hypertension  At risk for diabetes mellitus  Class 3 severe obesity with serious comorbidity and body mass index (BMI) of 50.0 to 59.9 in adult, unspecified obesity type (HCC)  PLAN:  Pre-Diabetes Amber Bentley will continue to work on weight loss, exercise, and decreasing simple carbohydrates in her diet to help decrease the risk of diabetes.  We dicussed metformin including benefits and risks. She was informed that eating too many simple carbohydrates or too many calories at one sitting increases the likelihood of GI side effects. Amber Bentley requested metformin for now and a prescription was written today for 1 month refill. Amber Bentley agreed to follow up with [name] as directed to monitor her progress.  Diabetes risk counseling Amber Bentley was given extended (15 minutes) diabetes prevention counseling today. She is 42 y.o. female and has risk factors for diabetes including obesity and pre-diabetes. We discussed intensive lifestyle modifications today with an emphasis on weight loss as well as increasing exercise and decreasing simple carbohydrates in her diet.  Hypertension We discussed sodium restriction, working on healthy weight loss, and a regular exercise program as the means to achieve improved blood Bentley control. Amber Bentley agreed with this plan and agreed to follow up as directed. We will continue to monitor her blood Bentley as well as her progress with the above lifestyle modifications. She will continue her medications as prescribed and will watch for signs of hypotension as she continues her lifestyle modifications.  Obesity Amber Bentley is currently in the action stage of change. As such, her goal is to continue with weight loss efforts She has agreed to keep a food journal with 1400 to 1600 calories and 90+ grams of protein daily or follow the Category 3 plan Amber Bentley has been instructed to work up to a goal of 150 minutes of combined cardio and strengthening exercise per week for weight loss and overall health benefits. We discussed the following Behavioral Modification Strategies today: keeping healthy foods in the home, planning for success, keep a strict food journal, increasing lean protein intake and work on meal  planning and easy cooking plans  Amber Bentley has agreed to follow up with our clinic in 2 weeks. She was informed of the importance of frequent  follow up visits to maximize her success with intensive lifestyle modifications for her multiple health conditions.   OBESITY BEHAVIORAL INTERVENTION VISIT  Today's visit was # 5 out of 22.  Starting weight: 351 lbs Starting date: 12/30/17 Today's weight : 324 lbs  Today's date: 02/25/2018 Total lbs lost to date: 61 (Patients must lose 7 lbs in the first 6 months to continue with counseling)   ASK: We discussed the diagnosis of obesity with Amber Bentley today and Ariele agreed to give [name] permission to discuss obesity behavioral modification therapy today.  ASSESS: Aamori has the diagnosis of obesity and her BMI today is 50.73 Kainat is in the action stage of change   ADVISE: Amber Bentley was educated on the multiple health risks of obesity as well as the benefit of weight loss to improve her health. She was advised of the need for long term treatment and the importance of lifestyle modifications.  AGREE: Multiple dietary modification options and treatment options were discussed and  Mckenzie agreed to the above obesity treatment plan.  I, [place] Crane, am acting as transcriptionist for Filbert Schilder, MD  I have reviewed the above documentation for accuracy and completeness, and I agree with the above. - Debbra Riding, MD

## 2018-02-26 DIAGNOSIS — I5032 Chronic diastolic (congestive) heart failure: Secondary | ICD-10-CM | POA: Diagnosis not present

## 2018-02-26 DIAGNOSIS — I5041 Acute combined systolic (congestive) and diastolic (congestive) heart failure: Secondary | ICD-10-CM | POA: Diagnosis not present

## 2018-02-26 DIAGNOSIS — I159 Secondary hypertension, unspecified: Secondary | ICD-10-CM | POA: Diagnosis not present

## 2018-02-26 MED FILL — PANTOPRAZOLE SOD DR 40 MG T: 40 | 90 days supply | Qty: 180 | Fill #0

## 2018-03-06 ENCOUNTER — Ambulatory Visit: Payer: 59 | Admitting: Neurology

## 2018-03-11 ENCOUNTER — Encounter: Payer: Self-pay | Admitting: Neurology

## 2018-03-18 ENCOUNTER — Ambulatory Visit (INDEPENDENT_AMBULATORY_CARE_PROVIDER_SITE_OTHER): Payer: 59 | Admitting: Family Medicine

## 2018-03-18 VITALS — BP 104/67 | HR 57 | Temp 98.2°F | Ht 67.0 in | Wt 311.0 lb

## 2018-03-18 DIAGNOSIS — E559 Vitamin D deficiency, unspecified: Secondary | ICD-10-CM | POA: Diagnosis not present

## 2018-03-18 DIAGNOSIS — Z6841 Body Mass Index (BMI) 40.0 and over, adult: Secondary | ICD-10-CM | POA: Diagnosis not present

## 2018-03-18 DIAGNOSIS — I509 Heart failure, unspecified: Secondary | ICD-10-CM | POA: Diagnosis not present

## 2018-03-18 DIAGNOSIS — Z9189 Other specified personal risk factors, not elsewhere classified: Secondary | ICD-10-CM

## 2018-03-18 MED ORDER — VITAMIN D (ERGOCALCIFEROL) 1.25 MG (50000 UNIT) PO CAPS: 50000.0000 [IU] | ORAL_CAPSULE | ORAL | 0 refills | Status: DC

## 2018-03-19 MED FILL — VIT D2 1.25 MG (50,000 UNIT: 1.25 MG | 28 days supply | Qty: 4 | Fill #0

## 2018-03-19 NOTE — Progress Notes (Signed)
Office: [phone]  /  Fax: [phone]   HPI:   Chief Complaint: OBESITY Amber Bentley is here to discuss her progress with her obesity treatment plan. She is on the keep a food journal with 1400-1600 calories and 90+ grams of protein daily and is following her eating plan approximately 75 % of the time. She states she is walking for 20-30 minutes 5 times per week. Amber Bentley is finding getting in all protein and calories to be difficult.  Her weight is (!) 311 lb (141.1 kg) today and has had a weight loss of 13 pounds over a period of 3 weeks since her last visit. She has lost 40 lbs since starting treatment with [name].  Vitamin D Deficiency Amber Bentley has a diagnosis of vitamin D deficiency. She is currently taking prescription Vit D and denies nausea, vomiting or muscle weakness.  At risk for osteopenia and osteoporosis Amber Bentley is at higher risk of osteopenia and osteoporosis due to vitamin D deficiency.   Congestive Heart Failure Amber Bentley is a 42 y.o. female with congestive heart failure. Amber Bentley's blood pressure is in low 100's. She notes occasional dizziness with standing. She is on Coreg, amlodipine, lisinopril, and Aldactone.  Amber Bentley's blood pressure is not currently controlled.  ALLERGIES: Allergies  Allergen Reactions  . 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   Amber Bentley [Naproxen] Other (See Comments)    Pt states that this medication makes her loopy.     MEDICATIONS: Current Outpatient Medications on File Prior to Visit  Medication Sig Dispense Refill  . carvedilol (COREG) 12.5 MG tablet Take 1 tablet (12.5 mg total) by mouth 2 (two) times daily with a meal. 60 tablet 3  . lisinopril (PRINIVIL,ZESTRIL) 20 MG tablet TAKE 1 TABLET BY MOUTH ONCE DAILY 90 tablet 1  . metFORMIN (GLUCOPHAGE) 500 MG tablet Take 1 tablet (500 mg total) by mouth daily with breakfast. 30 tablet 0  . pantoprazole (PROTONIX) 40 MG tablet TAKE 1 TABLET BY MOUTH TWICE DAILY 180 tablet 1   . amLODipine (NORVASC) 2.5 MG tablet Take 1 tablet (2.5 mg total) by mouth daily. 30 tablet 11  . rosuvastatin (CRESTOR) 10 MG tablet Take 1 tablet (10 mg total) by mouth daily. 90 tablet 3  . spironolactone (ALDACTONE) 25 MG tablet Take 1 tablet (25 mg total) by mouth daily. 90 tablet 3   No current facility-administered medications on file prior to visit.     PAST MEDICAL HISTORY: Past Medical History:  Diagnosis Date  . Anemia   . Anxiety   . Cardiomegaly   . Chest pain   . CHF (congestive heart failure) (HCC)   . Constipation   . Depression   . Dyspnea   . Fatigue   . Food allergy    celery  . GERD (gastroesophageal reflux disease)   . Hip pain   . HLD (hyperlipidemia)   . HTN (hypertension)   . Infertility, female   . Lactose intolerance   . Leg edema   . Lower back pain   . OSA on CPAP   . Palpitations   . Prediabetes   . Shoulder pain   . Stomach ulcer   . Varicose vein of leg     PAST SURGICAL HISTORY: Past Surgical History:  Procedure Laterality Date  . ABDOMINAL HYSTERECTOMY    . ENDOMETRIAL ABLATION  2007   prior to hysterectomy    SOCIAL HISTORY: Social History   Tobacco Use  . Smoking  status: Former Smoker    Packs/day: 0.25    Years: 10.00    Pack years: 2.50    Types: Cigarettes  . Smokeless tobacco: Never Used  Substance Use Topics  . Alcohol use: Yes    Comment: rarely  . Drug use: No    FAMILY HISTORY: Family History  Problem Relation Age of Onset  . Hypertension Mother   . Cancer Mother        breast cancer  . Kidney disease Mother   . Depression Mother   . Obesity Mother   . Diabetes Father   . Heart disease Father 37  . Hyperlipidemia Father   . Hypertension Father   . Depression Father   . Anxiety disorder Father   . Sleep apnea Father   . Obesity Father   . Thyroid disease Sister   . Sudden death Brother   . Cancer Maternal Aunt        breast cancer  . Cancer Paternal Grandfather        brain cancer     ROS: Review of Systems  Constitutional: Positive for weight loss.  Gastrointestinal: Negative for nausea and vomiting.  Musculoskeletal:       Negative muscle weakness  Neurological: Positive for dizziness.    PHYSICAL EXAM: Blood pressure 104/67, pulse (!) 57, temperature 98.2 F (36.8 C), temperature source Oral, height 5\' 7"  (1.702 m), weight (!) 311 lb (141.1 kg), SpO2 99 %. Body mass index is 48.71 kg/m. Physical Exam  Constitutional: She is oriented to person, place, and time. She appears well-developed and well-nourished.  Cardiovascular: Normal rate.  Pulmonary/Chest: Effort normal.  Musculoskeletal: Normal range of motion.  Neurological: She is oriented to person, place, and time.  Skin: Skin is warm and dry.  Psychiatric: She has a normal mood and affect. Her behavior is normal.  Vitals reviewed.   RECENT LABS AND TESTS: BMET    Component Value Date/Time   NA 141 12/30/2017 1147   K 4.9 12/30/2017 1147   CL 102 12/30/2017 1147   CO2 23 12/30/2017 1147   GLUCOSE 108 (H) 12/30/2017 1147   GLUCOSE 113 (H) 06/20/2017 0856   BUN 17 12/30/2017 1147   CREATININE 0.87 12/30/2017 1147   CALCIUM 9.3 12/30/2017 1147   GFRNONAA 84 12/30/2017 1147   GFRAA 97 12/30/2017 1147   Lab Results  Component Value Date   HGBA1C 6.0 (H) 12/30/2017   HGBA1C 5.8 02/21/2017   Lab Results  Component Value Date   INSULIN 89.7 (H) 12/30/2017   CBC    Component Value Date/Time   WBC 9.3 12/30/2017 1147   WBC 9.6 06/20/2017 0856   RBC 5.18 12/30/2017 1147   RBC 5.04 06/20/2017 0856   HGB 13.4 12/30/2017 1147   HCT 43.1 12/30/2017 1147   PLT 224.0 06/20/2017 0856   MCV 83 12/30/2017 1147   MCH 25.9 (L) 12/30/2017 1147   MCH 24.1 (L) 04/27/2017 2055   MCHC 31.1 (L) 12/30/2017 1147   MCHC 32.7 06/20/2017 0856   RDW 13.6 12/30/2017 1147   LYMPHSABS 2.0 12/30/2017 1147   MONOABS 0.6 02/21/2017 1510   EOSABS 0.2 12/30/2017 1147   BASOSABS 0.0 12/30/2017 1147    Iron/TIBC/Ferritin/ %Sat    Component Value Date/Time   IRON 85 06/20/2017 0856   TIBC 344 06/20/2017 0856   FERRITIN 55.4 06/20/2017 0856   IRONPCTSAT 25 06/20/2017 0856   Lipid Panel     Component Value Date/Time   CHOL 120 12/30/2017 1147  TRIG 65 12/30/2017 1147   HDL 45 12/30/2017 1147   CHOLHDL 3.3 04/08/2017 1124   CHOLHDL 4.7 02/22/2017 0420   VLDL 11 02/22/2017 0420   LDLCALC 62 12/30/2017 1147   Hepatic Function Panel     Component Value Date/Time   PROT 7.3 12/30/2017 1147   ALBUMIN 4.5 12/30/2017 1147   AST 24 12/30/2017 1147   ALT 28 12/30/2017 1147   ALKPHOS 70 12/30/2017 1147   BILITOT 0.3 12/30/2017 1147   BILIDIR 0.4 04/14/2017 0315   IBILI 0.7 04/14/2017 0315      Component Value Date/Time   TSH 1.270 12/30/2017 1147   TSH 3.01 06/20/2017 0856   TSH 4.78 (H) 02/21/2017 1510  Results for SEIDY, LABRECK (MRN [id number]) as of 03/19/2018 08:32  Ref. Range 12/30/2017 11:47  Vitamin D, 25-Hydroxy Latest Ref Range: 30.0 - 100.0 ng/mL 11.0 (L)    ASSESSMENT AND PLAN: Other congestive heart failure (HCC)  Vitamin D deficiency - Plan: Vitamin D, Ergocalciferol, (DRISDOL) 50000 units CAPS capsule  At risk for osteoporosis  Class 3 severe obesity with serious comorbidity and body mass index (BMI) of 45.0 to 49.9 in adult, unspecified obesity type (HCC)  PLAN:  Vitamin D Deficiency Amber Bentley was informed that low vitamin D levels contributes to fatigue and are associated with obesity, breast, and colon cancer. Amber Bentley agrees to continue taking prescription Vit D @50 ,000 IU every week #4 with no refills. She will follow up for routine testing of vitamin D, at least 2-3 times per year. She was informed of the risk of over-replacement of vitamin D and agrees to not increase her dose unless she discusses this with [name] first. Amber Bentley agrees to follow up with our clinic in 2 weeks.  At risk for osteopenia and osteoporosis Amber Bentley is at risk for osteopenia and osteoporsis  due to her vitamin D deficiency. She was encouraged to take her vitamin D and follow her higher calcium diet and increase strengthening exercise to help strengthen her bones and decrease her risk of osteopenia and osteoporosis.  Congestive Heart Failure We will continue to monitor her blood pressure as well as her progress with her lifestyle modifications. She will continue her current medications as prescribed and will follow up on blood medications at next visit. Amber Bentley agrees to follow up with our clinic in 2 weeks.  Obesity Amber Bentley is currently in the action stage of change. As such, her goal is to continue with weight loss efforts She has agreed to keep a food journal with 1400-1600 calories and 90+ grams of protein daily Amber Bentley has been instructed to work up to a goal of 150 minutes of combined cardio and strengthening exercise per week for weight loss and overall health benefits. Start resistance training 2 times per week for 5 minutes each time. We discussed the following Behavioral Modification Strategies today: increasing lean protein intake, work on meal planning and easy cooking plans, keeping healthy foods in the home, and better snacking choices   Amber Bentley has agreed to follow up with our clinic in 2 weeks. She was informed of the importance of frequent follow up visits to maximize her success with intensive lifestyle modifications for her multiple health conditions.   OBESITY BEHAVIORAL INTERVENTION VISIT  Today's visit was # 6 out of 22.  Starting weight: 351 lbs Starting date: 12/30/17 Today's weight : 311 lbs  Today's date: 03/18/2018 Total lbs lost to date: 40 (Patients must lose 7 lbs in the first 6 months to continue with counseling)  ASK: We discussed the diagnosis of obesity with Amber Bentley today and Amber Bentley agreed to give [name] permission to discuss obesity behavioral modification therapy today.  ASSESS: Amber Bentley has the diagnosis of obesity and her BMI today is 48.7 Amber Bentley is  in the action stage of change   ADVISE: Amber Bentley was educated on the multiple health risks of obesity as well as the benefit of weight loss to improve her health. She was advised of the need for long term treatment and the importance of lifestyle modifications.  AGREE: Multiple dietary modification options and treatment options were discussed and  Amber Bentley agreed to the above obesity treatment plan.  I, Burt Knack, am acting as transcriptionist for Debbra Riding, MD  I have reviewed the above documentation for accuracy and completeness, and I agree with the above. - Debbra Riding, MD

## 2018-03-29 DIAGNOSIS — I5032 Chronic diastolic (congestive) heart failure: Secondary | ICD-10-CM | POA: Diagnosis not present

## 2018-03-29 DIAGNOSIS — I159 Secondary hypertension, unspecified: Secondary | ICD-10-CM | POA: Diagnosis not present

## 2018-03-29 DIAGNOSIS — I5041 Acute combined systolic (congestive) and diastolic (congestive) heart failure: Secondary | ICD-10-CM | POA: Diagnosis not present

## 2018-03-31 ENCOUNTER — Ambulatory Visit (INDEPENDENT_AMBULATORY_CARE_PROVIDER_SITE_OTHER): Payer: 59 | Admitting: Family Medicine

## 2018-03-31 VITALS — BP 142/84 | HR 62 | Temp 97.6°F | Ht 67.0 in | Wt 310.0 lb

## 2018-03-31 DIAGNOSIS — I509 Heart failure, unspecified: Secondary | ICD-10-CM | POA: Diagnosis not present

## 2018-03-31 DIAGNOSIS — Z9189 Other specified personal risk factors, not elsewhere classified: Secondary | ICD-10-CM

## 2018-03-31 DIAGNOSIS — Z6841 Body Mass Index (BMI) 40.0 and over, adult: Secondary | ICD-10-CM | POA: Diagnosis not present

## 2018-03-31 DIAGNOSIS — E559 Vitamin D deficiency, unspecified: Secondary | ICD-10-CM | POA: Diagnosis not present

## 2018-03-31 DIAGNOSIS — E66813 Obesity, class 3: Secondary | ICD-10-CM

## 2018-03-31 DIAGNOSIS — R7303 Prediabetes: Secondary | ICD-10-CM

## 2018-04-01 NOTE — Progress Notes (Signed)
Office: [phone]  /  Fax: [phone]   HPI:   Chief Complaint: OBESITY Amber Bentley is here to discuss her progress with her obesity treatment plan. She is on the keep a food journal with 1400-1600 calories and 90+ grams of protein daily and is following her eating plan approximately 95 % of the time. She states she is exercising 0 minutes 0 times per week. Amber Bentley is still enjoying journaling, only went over calories 1 day.  Her weight is (!) 310 lb (140.6 kg) today and has had a weight loss of 1 pound over a period of 2 weeks since her last visit. She has lost 41 lbs since starting treatment with [name].  Congestive Heart Failure Lashondra had a bit of chest pain over the past weekend. She denies pain currently, slept with oxygen last night. No change in oxygen saturation today.  Vitamin D Deficiency Amber Bentley has a diagnosis of vitamin D deficiency. She is currently taking prescription Vit D and denies nausea, vomiting or muscle weakness.  At risk for osteopenia and osteoporosis Amber Bentley is at higher risk of osteopenia and osteoporosis due to vitamin D deficiency.   Pre-Diabetes Amber Bentley has a diagnosis of pre-diabetes based on her elevated Hgb A1c and was informed this puts her at greater risk of developing diabetes. Last Hgb A1c of 6.0, on metformin, and she denies carbohydrate cravings. She continues to work on diet and exercise to decrease risk of diabetes. She denies nausea or hypoglycemia.  ALLERGIES: Allergies  Allergen Reactions  . 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   [name] [Naproxen] Other (See Comments)    Pt states that this medication makes her loopy.     MEDICATIONS: Current Outpatient Medications on File Prior to Visit  Medication Sig Dispense Refill  . carvedilol (COREG) 12.5 MG tablet Take 1 tablet (12.5 mg total) by mouth 2 (two) times daily with a meal. 60 tablet 3  . lisinopril (PRINIVIL,ZESTRIL) 20 MG tablet TAKE 1 TABLET BY MOUTH ONCE  DAILY 90 tablet 1  . metFORMIN (GLUCOPHAGE) 500 MG tablet Take 1 tablet (500 mg total) by mouth daily with breakfast. 30 tablet 0  . pantoprazole (PROTONIX) 40 MG tablet TAKE 1 TABLET BY MOUTH TWICE DAILY 180 tablet 1  . Vitamin D, Ergocalciferol, (DRISDOL) 50000 units CAPS capsule Take 1 capsule (50,000 Units total) by mouth every 7 (seven) days. 4 capsule 0  . amLODipine (NORVASC) 2.5 MG tablet Take 1 tablet (2.5 mg total) by mouth daily. 30 tablet 11  . rosuvastatin (CRESTOR) 10 MG tablet Take 1 tablet (10 mg total) by mouth daily. 90 tablet 3  . spironolactone (ALDACTONE) 25 MG tablet Take 1 tablet (25 mg total) by mouth daily. 90 tablet 3   No current facility-administered medications on file prior to visit.     PAST MEDICAL HISTORY: Past Medical History:  Diagnosis Date  . Anemia   . Anxiety   . Cardiomegaly   . Chest pain   . CHF (congestive heart failure) (HCC)   . Constipation   . Depression   . Dyspnea   . Fatigue   . Food allergy    celery  . GERD (gastroesophageal reflux disease)   . Hip pain   . HLD (hyperlipidemia)   . HTN (hypertension)   . Infertility, female   . Lactose intolerance   . Leg edema   . Lower back pain   . OSA on CPAP   . Palpitations   .  Prediabetes   . Shoulder pain   . Stomach ulcer   . Varicose vein of leg     PAST SURGICAL HISTORY: Past Surgical History:  Procedure Laterality Date  . ABDOMINAL HYSTERECTOMY    . ENDOMETRIAL ABLATION  2007   prior to hysterectomy    SOCIAL HISTORY: Social History   Tobacco Use  . Smoking status: Former Smoker    Packs/day: 0.25    Years: 10.00    Pack years: 2.50    Types: Cigarettes  . Smokeless tobacco: Never Used  Substance Use Topics  . Alcohol use: Yes    Comment: rarely  . Drug use: No    FAMILY HISTORY: Family History  Problem Relation Age of Onset  . Hypertension Mother   . Cancer Mother        breast cancer  . Kidney disease Mother   . Depression Mother   . Obesity  Mother   . Diabetes Father   . Heart disease Father 90  . Hyperlipidemia Father   . Hypertension Father   . Depression Father   . Anxiety disorder Father   . Sleep apnea Father   . Obesity Father   . Thyroid disease Sister   . Sudden death Brother   . Cancer Maternal Aunt        breast cancer  . Cancer Paternal Grandfather        brain cancer    ROS: Review of Systems  Constitutional: Positive for weight loss.  Cardiovascular: Negative for chest pain.  Gastrointestinal: Negative for nausea and vomiting.  Musculoskeletal:       Negative muscle weakness  Endo/Heme/Allergies:       Negative hypoglycemia    PHYSICAL EXAM: Blood pressure (!) 142/84, pulse 62, temperature 97.6 F (36.4 C), temperature source Oral, height 5\' 7"  (1.702 m), weight (!) 310 lb (140.6 kg), SpO2 100 %. Body mass index is 48.55 kg/m. Physical Exam  Constitutional: She is oriented to person, place, and time. She appears well-developed and well-nourished.  Cardiovascular: Normal rate.  Pulmonary/Chest: Effort normal.  Musculoskeletal: Normal range of motion.  Neurological: She is oriented to person, place, and time.  Skin: Skin is warm and dry.  Psychiatric: She has a normal mood and affect. Her behavior is normal.  Vitals reviewed.   RECENT LABS AND TESTS: BMET    Component Value Date/Time   NA 141 12/30/2017 1147   K 4.9 12/30/2017 1147   CL 102 12/30/2017 1147   CO2 23 12/30/2017 1147   GLUCOSE 108 (H) 12/30/2017 1147   GLUCOSE 113 (H) 06/20/2017 0856   BUN 17 12/30/2017 1147   CREATININE 0.87 12/30/2017 1147   CALCIUM 9.3 12/30/2017 1147   GFRNONAA 84 12/30/2017 1147   GFRAA 97 12/30/2017 1147   Lab Results  Component Value Date   HGBA1C 6.0 (H) 12/30/2017   HGBA1C 5.8 02/21/2017   Lab Results  Component Value Date   INSULIN 89.7 (H) 12/30/2017   CBC    Component Value Date/Time   WBC 9.3 12/30/2017 1147   WBC 9.6 06/20/2017 0856   RBC 5.18 12/30/2017 1147   RBC 5.04  06/20/2017 0856   HGB 13.4 12/30/2017 1147   HCT 43.1 12/30/2017 1147   PLT 224.0 06/20/2017 0856   MCV 83 12/30/2017 1147   MCH 25.9 (L) 12/30/2017 1147   MCH 24.1 (L) 04/27/2017 2055   MCHC 31.1 (L) 12/30/2017 1147   MCHC 32.7 06/20/2017 0856   RDW 13.6 12/30/2017 1147  LYMPHSABS 2.0 12/30/2017 1147   MONOABS 0.6 02/21/2017 1510   EOSABS 0.2 12/30/2017 1147   BASOSABS 0.0 12/30/2017 1147   Iron/TIBC/Ferritin/ %Sat    Component Value Date/Time   IRON 85 06/20/2017 0856   TIBC 344 06/20/2017 0856   FERRITIN 55.4 06/20/2017 0856   IRONPCTSAT 25 06/20/2017 0856   Lipid Panel     Component Value Date/Time   CHOL 120 12/30/2017 1147   TRIG 65 12/30/2017 1147   HDL 45 12/30/2017 1147   CHOLHDL 3.3 04/08/2017 1124   CHOLHDL 4.7 02/22/2017 0420   VLDL 11 02/22/2017 0420   LDLCALC 62 12/30/2017 1147   Hepatic Function Panel     Component Value Date/Time   PROT 7.3 12/30/2017 1147   ALBUMIN 4.5 12/30/2017 1147   AST 24 12/30/2017 1147   ALT 28 12/30/2017 1147   ALKPHOS 70 12/30/2017 1147   BILITOT 0.3 12/30/2017 1147   BILIDIR 0.4 04/14/2017 0315   IBILI 0.7 04/14/2017 0315      Component Value Date/Time   TSH 1.270 12/30/2017 1147   TSH 3.01 06/20/2017 0856   TSH 4.78 (H) 02/21/2017 1510  Results for Amber Bentley, Amber Bentley (MRN [id number]) as of 04/01/2018 08:19  Ref. Range 12/30/2017 11:47  Vitamin D, 25-Hydroxy Latest Ref Range: 30.0 - 100.0 ng/mL 11.0 (L)    ASSESSMENT AND PLAN: Other congestive heart failure (HCC) - Plan: Lipid Panel With LDL/HDL Ratio  Vitamin D deficiency - Plan: VITAMIN D 25 Hydroxy (Vit-D Deficiency, Fractures)  Prediabetes - Plan: Hemoglobin A1c, Insulin, random, Lipid Panel With LDL/HDL Ratio  At risk for osteoporosis  Class 3 severe obesity with serious comorbidity and body mass index (BMI) of 45.0 to 49.9 in adult, unspecified obesity type (HCC)  PLAN:  Congestive Heart Failure We will follow up at next visit; Amber Bentley is to notify her  Cardiologist if chest pain continues. Amber Bentley agrees to follow up with our clinic in 2 weeks.  Vitamin D Deficiency Amber Bentley was informed that low vitamin D levels contributes to fatigue and are associated with obesity, breast, and colon cancer. Amber Bentley agrees to continue taking prescription Vit D @50 ,000 IU every week, no refill needed and will follow up for routine testing of vitamin D, at least 2-3 times per year. She was informed of the risk of over-replacement of vitamin D and agrees to not increase her dose unless she discusses this with [name] first. We will check labs and Amber Bentley agrees to follow up with our clinic in 2 weeks.  At risk for osteopenia and osteoporosis Amber Bentley is at risk for osteopenia and osteoporsis due to her vitamin D deficiency. She was encouraged to take her vitamin D and follow her higher calcium diet and increase strengthening exercise to help strengthen her bones and decrease her risk of osteopenia and osteoporosis.  Pre-Diabetes Amber Bentley will continue to work on weight loss, exercise, and decreasing simple carbohydrates in her diet to help decrease the risk of diabetes. We dicussed metformin including benefits and risks. She was informed that eating too many simple carbohydrates or too many calories at one sitting increases the likelihood of GI side effects. Amber Bentley agrees to continue taking metformin and we will check labs. Amber Bentley agrees to follow up with our clinic in 2 weeks as directed to monitor her progress.  Obesity Amber Bentley is currently in the action stage of change. As such, her goal is to continue with weight loss efforts She has agreed to keep a food journal with 1600 calories and 90+ grams of  protein daily Amber Bentley has been instructed to work up to a goal of 150 minutes of combined cardio and strengthening exercise per week for weight loss and overall health benefits. We discussed the following Behavioral Modification Strategies today: increasing lean protein intake, increasing  vegetables, work on meal planning and easy cooking plans, and planning for success   Amber Bentley has agreed to follow up with our clinic in 2 weeks. She was informed of the importance of frequent follow up visits to maximize her success with intensive lifestyle modifications for her multiple health conditions.   OBESITY BEHAVIORAL INTERVENTION VISIT  Today's visit was # 7 out of 22.  Starting weight: 351 lbs Starting date: 12/30/17 Today's weight : 310 lbs  Today's date: 03/31/2018 Total lbs lost to date: 6 (Patients must lose 7 lbs in the first 6 months to continue with counseling)   ASK: We discussed the diagnosis of obesity with Amber Bentley today and Amber Bentley agreed to give [name] permission to discuss obesity behavioral modification therapy today.  ASSESS: Amber Bentley has the diagnosis of obesity and her BMI today is 48.54 Amber Bentley is in the action stage of change   ADVISE: Amber Bentley was educated on the multiple health risks of obesity as well as the benefit of weight loss to improve her health. She was advised of the need for long term treatment and the importance of lifestyle modifications.  AGREE: Multiple dietary modification options and treatment options were discussed and  Amber Bentley agreed to the above obesity treatment plan.  I, Burt Knack, am acting as transcriptionist for Debbra Riding, MD  I have reviewed the above documentation for accuracy and completeness, and I agree with the above. - Debbra Riding, MD

## 2018-04-03 ENCOUNTER — Other Ambulatory Visit (INDEPENDENT_AMBULATORY_CARE_PROVIDER_SITE_OTHER): Payer: Self-pay | Admitting: Family Medicine

## 2018-04-03 DIAGNOSIS — R7303 Prediabetes: Secondary | ICD-10-CM

## 2018-04-07 MED FILL — metFORMIN HCL 500 MG TABS: 500 | 30 days supply | Qty: 30 | Fill #0

## 2018-04-14 ENCOUNTER — Other Ambulatory Visit (HOSPITAL_COMMUNITY)
Admission: RE | Admit: 2018-04-14 | Discharge: 2018-04-14 | Disposition: A | Discharge status: Home / Self Care | Payer: 59 | Source: Ambulatory Visit | Attending: Family Medicine | Admitting: Family Medicine

## 2018-04-14 ENCOUNTER — Other Ambulatory Visit: Payer: 59

## 2018-04-14 DIAGNOSIS — Z13228 Encounter for screening for other metabolic disorders: Secondary | ICD-10-CM | POA: Insufficient documentation

## 2018-04-15 ENCOUNTER — Other Ambulatory Visit: Payer: Self-pay | Admitting: Cardiovascular Disease

## 2018-04-15 ENCOUNTER — Encounter: Payer: Self-pay | Admitting: Cardiovascular Disease

## 2018-04-15 ENCOUNTER — Ambulatory Visit (INDEPENDENT_AMBULATORY_CARE_PROVIDER_SITE_OTHER): Payer: 59 | Admitting: Family Medicine

## 2018-04-15 VITALS — BP 115/76 | HR 59 | Temp 98.1°F | Ht 67.0 in | Wt 306.0 lb

## 2018-04-15 DIAGNOSIS — E559 Vitamin D deficiency, unspecified: Secondary | ICD-10-CM | POA: Diagnosis not present

## 2018-04-15 DIAGNOSIS — I5032 Chronic diastolic (congestive) heart failure: Secondary | ICD-10-CM | POA: Diagnosis not present

## 2018-04-15 DIAGNOSIS — Z9189 Other specified personal risk factors, not elsewhere classified: Secondary | ICD-10-CM | POA: Diagnosis not present

## 2018-04-15 DIAGNOSIS — Z6841 Body Mass Index (BMI) 40.0 and over, adult: Secondary | ICD-10-CM | POA: Diagnosis not present

## 2018-04-15 DIAGNOSIS — R7303 Prediabetes: Secondary | ICD-10-CM

## 2018-04-15 MED ORDER — VITAMIN D (ERGOCALCIFEROL) 1.25 MG (50000 UNIT) PO CAPS: 50000.0000 [IU] | ORAL_CAPSULE | ORAL | 0 refills | Status: DC

## 2018-04-15 MED FILL — SPIRONOLACTONE 25 MG TABLET: 25 | 90 days supply | Qty: 90 | Fill #0

## 2018-04-16 ENCOUNTER — Encounter: Payer: Self-pay | Admitting: Cardiovascular Disease

## 2018-04-16 ENCOUNTER — Ambulatory Visit (INDEPENDENT_AMBULATORY_CARE_PROVIDER_SITE_OTHER): Payer: 59 | Admitting: Cardiovascular Disease

## 2018-04-16 VITALS — BP 110/60 | HR 61 | Ht 65.0 in | Wt 311.4 lb

## 2018-04-16 DIAGNOSIS — I10 Essential (primary) hypertension: Secondary | ICD-10-CM

## 2018-04-16 DIAGNOSIS — I5042 Chronic combined systolic (congestive) and diastolic (congestive) heart failure: Secondary | ICD-10-CM

## 2018-04-16 MED ORDER — CARVEDILOL 6.25 MG PO TABS: 6.2500 mg | ORAL_TABLET | Freq: Two times a day (BID) | ORAL | 3 refills | Status: DC

## 2018-04-16 MED FILL — VIT D2 1.25 MG (50,000 UNIT: 1.25 MG | 28 days supply | Qty: 4 | Fill #0

## 2018-04-16 MED FILL — CARVEDILOL 6.25 MG TABLET: 6.25 | 90 days supply | Qty: 180 | Fill #0

## 2018-04-16 NOTE — Patient Instructions (Addendum)
Medication Instructions:  Your physician has recommended you make the following change in your medication:   STOP Amlodipine DECREASE Coreg (Carvedilol) to 6.25 mg twice daily   Labwork: None Ordered   Testing/Procedures: None Ordered   Follow-Up: Your physician recommends that you return for your follow-up appointment in: 3-4 months with Dr. Elease Hashimoto on Monday Sept. 9   If you need a refill on your cardiac medications before your next appointment, please call your pharmacy.   Thank you for choosing [facility]! Eligha Bridegroom, RN [phone]

## 2018-04-16 NOTE — Progress Notes (Signed)
Cardiology Office Note   Date:  04/16/2018   ID:  Moniesha Maners, DOB 1976/09/18, MRN [id number]  PCP:  Anne Ng, NP  Cardiologist:   Kristeen Miss, MD   Chief Complaint  Patient presents with  . Hypertension   Problem list 1. Hypertensive emergency 2. Morbid obesity 3. Pulmonary hypertension-moderate 4. Possible sleep apnea    Estel Almon is a 42 y.o. female who presents for follow-up of her recent hospitalization for hypertensive emergency.  Yarlin has had high blood pressure for several years but has not been on medications.  I saw her in the [facility] emergency. Room. She has been started on medications. She was in the hospital for 4 days  She seems to be tolerating her medications without any problems.  She had an echocardiogram which revealed mildly depressed left ventricle systolic function with an EF of 45-50%. She has grade 2 diastolic dysfunction.  She has moderate pulmonary hypertension with an estimate PA pressure of 59.  Is avoiding salt. 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  Is a Administrator .  Smokes some .    April 08, 2017:  Mystery is seen as a work in visit today for chest pain . Stopped smoking in March .  Has gained some weight .  Wt. Today is 303 .   Has cluster headaches.   Wt Readings from Last 3 Encounters:  04/16/18 (!) 311 lb 6.4 oz (141.3 kg)  04/15/18 (!) 306 lb (138.8 kg)  03/31/18 (!) 310 lb (140.6 kg)    Jarrah started having some CP several years ago . Overall had a pretty good weekend - not much in the way of CP. The CP is a heaviness,   Last for hours.   Lasted most of the day yesterday . Seems to related to upper body movement - was not worsened with walking .     Has a significant family hx of CAD   [name], 2018: Vannette is seen  Wt is 333.   Has not been exercising .   Teaches preschool .  Has 2 children - age 53 ( son) and 70 (  daughter) . Eats poorly,   Knows that she eats the wrong foods.  Blames stress   BP has been stable , no heart failure symptoms  Has cluster headaches.   Developed a MRSA infection on her nose related to her CPAP .   Apr 16, 2018: Chealsy is seen today for follow-up of her hypertension and chronic combined systolic and diastolic congestive heart failure.  Her blood pressures is well controlled.  Wt = 311 ( down  from 324 on March, 2019  Has lost 45 lbs total   Has been having some dizziness since she has been losing weight  Lots of orthostatic hypotension   Still has chest pain  - especially if she puls her CPAP off at night   Has stopped smoking   Is exercising .  Walks 4 days a week .   Breakfast - 2 egg omlett with thin slice of cheese on a wrap , fruit  Lunch -  Lean cuisine, yogart, cup of fruit Dinner - 6 oz of lean meat, 2 cups of veggies   Past Medical History:  Diagnosis Date  . Anemia   . Anxiety   . Cardiomegaly   . Chest pain   . CHF (congestive heart  failure) (HCC)   . Constipation   . Depression   . Dyspnea   . Fatigue   . Food allergy    celery  . GERD (gastroesophageal reflux disease)   . Hip pain   . HLD (hyperlipidemia)   . HTN (hypertension)   . Infertility, female   . Lactose intolerance   . Leg edema   . Lower back pain   . OSA on CPAP   . Palpitations   . Prediabetes   . Shoulder pain   . Stomach ulcer   . Varicose vein of leg     Past Surgical History:  Procedure Laterality Date  . ABDOMINAL HYSTERECTOMY    . ENDOMETRIAL ABLATION  2007   prior to hysterectomy     Current Outpatient Medications  Medication Sig Dispense Refill  . carvedilol (COREG) 12.5 MG tablet Take 1 tablet (12.5 mg total) by mouth 2 (two) times daily with a meal. 60 tablet 3  . lisinopril (PRINIVIL,ZESTRIL) 20 MG tablet TAKE 1 TABLET BY MOUTH ONCE DAILY 90 tablet 1  . metFORMIN (GLUCOPHAGE) 500 MG tablet TAKE 1 TABLET BY MOUTH ONCE DAILY WITH BREAKFAST 30  tablet 0  . pantoprazole (PROTONIX) 40 MG tablet TAKE 1 TABLET BY MOUTH TWICE DAILY 180 tablet 1  . rosuvastatin (CRESTOR) 10 MG tablet Take 1 tablet (10 mg total) by mouth daily. 90 tablet 3  . spironolactone (ALDACTONE) 25 MG tablet Take 25 mg by mouth daily.    . Vitamin D, Ergocalciferol, (DRISDOL) 50000 units CAPS capsule Take 1 capsule (50,000 Units total) by mouth every 7 (seven) days. 4 capsule 0  . amLODipine (NORVASC) 2.5 MG tablet Take 1 tablet (2.5 mg total) by mouth daily. 30 tablet 11   No current facility-administered medications for this visit.     No flowsheet data found.    Allergies:   Food and Aleve [naproxen]    Social History:  The patient  reports that she has quit smoking. Her smoking use included cigarettes. She has a 2.50 pack-year smoking history. She has never used smokeless tobacco. She reports that she drinks alcohol. She reports that she does not use drugs.   Family History:  The patient's family history includes Anxiety disorder in her father; Cancer in her maternal aunt, mother, and paternal grandfather; Depression in her father and mother; Diabetes in her father; Heart disease (age of onset: 69) in her father; Hyperlipidemia in her father; Hypertension in her father and mother; Kidney disease in her mother; Obesity in her father and mother; Sleep apnea in her father; Sudden death in her brother; Thyroid disease in her sister.    ROS:    Noted in current history, otherwise review of systems is negative.  Physical Exam: Blood pressure 110/60, pulse 61, height 5\' 5"  (1.651 m), weight (!) 311 lb 6.4 oz (141.3 kg), SpO2 97 %.  GEN:   Obese , young female  HEENT: Normal NECK: No JVD; No carotid bruits LYMPHATICS: No lymphadenopathy CARDIAC: RRR , no significant murmur  RESPIRATORY:  Clear to auscultation without rales, wheezing or rhonchi  ABDOMEN: Soft, non-tender, non-distended MUSCULOSKELETAL:  No edema; No deformity  SKIN: Warm and dry NEUROLOGIC:   Alert and oriented x 3   EKG:     Recent Labs: 06/20/2017: Platelets 224.0; Pro B Natriuretic peptide (BNP) 37.0 12/30/2017: ALT 28; BUN 17; Creatinine, Ser 0.87; Hemoglobin 13.4; Potassium 4.9; Sodium 141; TSH 1.270    Lipid Panel    Component Value Date/Time   CHOL  120 12/30/2017 1147   TRIG 65 12/30/2017 1147   HDL 45 12/30/2017 1147   CHOLHDL 3.3 04/08/2017 1124   CHOLHDL 4.7 02/22/2017 0420   VLDL 11 02/22/2017 0420   LDLCALC 62 12/30/2017 1147      Wt Readings from Last 3 Encounters:  04/16/18 (!) 311 lb 6.4 oz (141.3 kg)  04/15/18 (!) 306 lb (138.8 kg)  03/31/18 (!) 310 lb (140.6 kg)      Other studies Reviewed: Additional studies/ records that were reviewed today include: . Review of the above records demonstrates:    ASSESSMENT AND PLAN:  1. Chest pain: Has very rare atypical episodes of chest pain.  I doubt that these are cardiac   2.   Hypertensive emergency:  BP is better, in fact, she is having some episodes of hypotension and orthostatic hypotension because of her weight loss. We will stop the amlodipine completely.  We will reduce the carvedilol to 6.25 mg twice a day.  3.  Chronic combined systolic and diastolic congestive heart failure: Original echocardiogram performed March, 2018 showed ejection fraction of 45 to 50%.  She has grade 2 diastolic dysfunction.  She was also found to have moderate pulmonary artery hypertension with an estimated PA pressure of 59.  We have not repeated her echocardiogram her echocardiogram yet but will after she has lost more weight Clinically she is doing very well    3.  Pulmonary hypertension:  -Has sleep apnea.  She has been using her CPAP mask.  4. Morbid obesity:    Is participating in wellness program at [facility]. Doing very well with her weight loss.   Current medicines are reviewed at length with the patient today.  The patient does not have concerns regarding medicines.  Labs/ tests ordered today include:   No orders of the defined types were placed in this encounter.   Disposition:   FU with me in 3-4  months.    Kristeen Miss, MD  04/16/2018 3:28 PM    [facility] [address] [place], [place], [place]  [address] Phone: [phone]; Fax: [phone]

## 2018-04-17 NOTE — Progress Notes (Signed)
Office: [phone]  /  Fax: [phone]   HPI:   Chief Complaint: OBESITY Amber Bentley is here to discuss her progress with her obesity treatment plan. She is on the  keep a food journal with 1600 calories and 90+ grams of protein daily and is following her eating plan approximately 75 % of the time. She states she is exercising 30 to 60 minutes 7 times per week. Amber Bentley found indulging at [place] to be a slippery slope of craving more sugar. She has increased walking secondary to the nice weather. Her weight is (!) 306 lb (138.8 kg) today and has had a weight loss of 4 pounds over a period of 2 weeks since her last visit. She has lost 40 lbs since starting treatment with [name].  Vitamin D deficiency Amber Bentley has a diagnosis of vitamin D deficiency. She is currently taking vit D and denies nausea, vomiting or muscle weakness.  At risk for osteopenia and osteoporosis Amber Bentley is at higher risk of osteopenia and osteoporosis due to vitamin D deficiency.   Pre-Diabetes Amber Bentley has a diagnosis of pre-diabetes based on her elevated Hgb A1c and was informed this puts her at greater risk of developing diabetes. She had a few days without metformin and had an increase in carb cravings over Easter. Amber Bentley  continues to work on diet and exercise to decrease risk of diabetes. She denies nausea or hypoglycemia.  Congestive Heart Failure Amber Bentley occasionally had dizziness and lightheadedness. Her blood pressure is controlled currently. She is slightly bradycardic.  ALLERGIES: Allergies  Allergen Reactions   Food Anaphylaxis and Other (See Comments)    Pt is allergic to celery.    Aleve [Naproxen] Other (See Comments)    Pt states that this medication makes her loopy.     MEDICATIONS: Current Outpatient Medications on File Prior to Visit  Medication Sig Dispense Refill   lisinopril (PRINIVIL,ZESTRIL) 20 MG tablet TAKE 1 TABLET BY MOUTH ONCE DAILY 90 tablet 1   metFORMIN (GLUCOPHAGE) 500 MG tablet TAKE 1  TABLET BY MOUTH ONCE DAILY WITH BREAKFAST 30 tablet 0   pantoprazole (PROTONIX) 40 MG tablet TAKE 1 TABLET BY MOUTH TWICE DAILY 180 tablet 1   rosuvastatin (CRESTOR) 10 MG tablet Take 1 tablet (10 mg total) by mouth daily. 90 tablet 3   No current facility-administered medications on file prior to visit.     PAST MEDICAL HISTORY: Past Medical History:  Diagnosis Date   Anemia    Anxiety    Cardiomegaly    Chest pain    CHF (congestive heart failure) (HCC)    Constipation    Depression    Dyspnea    Fatigue    Food allergy    celery   GERD (gastroesophageal reflux disease)    Hip pain    HLD (hyperlipidemia)    HTN (hypertension)    Infertility, female    Lactose intolerance    Leg edema    Lower back pain    OSA on CPAP    Palpitations    Prediabetes    Shoulder pain    Stomach ulcer    Varicose vein of leg     PAST SURGICAL HISTORY: Past Surgical History:  Procedure Laterality Date   ABDOMINAL HYSTERECTOMY     ENDOMETRIAL ABLATION  2007   prior to hysterectomy    SOCIAL HISTORY: Social History   Tobacco Use   Smoking status: Former Smoker    Packs/day: 0.25    Years: 10.00    Pack  years: 2.50    Types: Cigarettes   Smokeless tobacco: Never Used  Substance Use Topics   Alcohol use: Yes    Comment: rarely   Drug use: No    FAMILY HISTORY: Family History  Problem Relation Age of Onset   Hypertension Mother    Cancer Mother        breast cancer   Kidney disease Mother    Depression Mother    Obesity Mother    Diabetes Father    Heart disease Father 88   Hyperlipidemia Father    Hypertension Father    Depression Father    Anxiety disorder Father    Sleep apnea Father    Obesity Father    Thyroid disease Sister    Sudden death Brother    Cancer Maternal Aunt        breast cancer   Cancer Paternal Grandfather        brain cancer    ROS: Review of Systems  Constitutional: Positive for  weight loss.  Gastrointestinal: Negative for nausea and vomiting.  Musculoskeletal:       Negative for muscle weakness  Neurological: Positive for dizziness.       Positive for lightheadedness  Endo/Heme/Allergies:       Negative for hypoglycemia    PHYSICAL EXAM: Blood pressure 115/76, pulse (!) 59, temperature 98.1 F (36.7 C), height 5\' 7"  (1.702 m), weight (!) 306 lb (138.8 kg), SpO2 98 %. Body mass index is 47.93 kg/m. Physical Exam  Constitutional: She is oriented to person, place, and time. She appears well-developed and well-nourished.  Cardiovascular: Normal rate.  Pulmonary/Chest: Effort normal.  Musculoskeletal: Normal range of motion.  Neurological: She is oriented to person, place, and time.  Skin: Skin is warm and dry.  Psychiatric: She has a normal mood and affect. Her behavior is normal.  Vitals reviewed.   RECENT LABS AND TESTS: BMET    Component Value Date/Time   NA 141 12/30/2017 1147   K 4.9 12/30/2017 1147   CL 102 12/30/2017 1147   CO2 23 12/30/2017 1147   GLUCOSE 108 (H) 12/30/2017 1147   GLUCOSE 113 (H) 06/20/2017 0856   BUN 17 12/30/2017 1147   CREATININE 0.87 12/30/2017 1147   CALCIUM 9.3 12/30/2017 1147   GFRNONAA 84 12/30/2017 1147   GFRAA 97 12/30/2017 1147   Lab Results  Component Value Date   HGBA1C 6.0 (H) 12/30/2017   HGBA1C 5.8 02/21/2017   Lab Results  Component Value Date   INSULIN 89.7 (H) 12/30/2017   CBC    Component Value Date/Time   WBC 9.3 12/30/2017 1147   WBC 9.6 06/20/2017 0856   RBC 5.18 12/30/2017 1147   RBC 5.04 06/20/2017 0856   HGB 13.4 12/30/2017 1147   HCT 43.1 12/30/2017 1147   PLT 224.0 06/20/2017 0856   MCV 83 12/30/2017 1147   MCH 25.9 (L) 12/30/2017 1147   MCH 24.1 (L) 04/27/2017 2055   MCHC 31.1 (L) 12/30/2017 1147   MCHC 32.7 06/20/2017 0856   RDW 13.6 12/30/2017 1147   LYMPHSABS 2.0 12/30/2017 1147   MONOABS 0.6 02/21/2017 1510   EOSABS 0.2 12/30/2017 1147   BASOSABS 0.0 12/30/2017 1147     Iron/TIBC/Ferritin/ %Sat    Component Value Date/Time   IRON 85 06/20/2017 0856   TIBC 344 06/20/2017 0856   FERRITIN 55.4 06/20/2017 0856   IRONPCTSAT 25 06/20/2017 0856   Lipid Panel     Component Value Date/Time   CHOL 120 12/30/2017  1147   TRIG 65 12/30/2017 1147   HDL 45 12/30/2017 1147   CHOLHDL 3.3 04/08/2017 1124   CHOLHDL 4.7 02/22/2017 0420   VLDL 11 02/22/2017 0420   LDLCALC 62 12/30/2017 1147   Hepatic Function Panel     Component Value Date/Time   PROT 7.3 12/30/2017 1147   ALBUMIN 4.5 12/30/2017 1147   AST 24 12/30/2017 1147   ALT 28 12/30/2017 1147   ALKPHOS 70 12/30/2017 1147   BILITOT 0.3 12/30/2017 1147   BILIDIR 0.4 04/14/2017 0315   IBILI 0.7 04/14/2017 0315      Component Value Date/Time   TSH 1.270 12/30/2017 1147   TSH 3.01 06/20/2017 0856   TSH 4.78 (H) 02/21/2017 1510   Results for CHARNAY, NAZARIO (MRN [id number]) as of 04/17/2018 09:39  Ref. Range 12/30/2017 11:47  Vitamin D, 25-Hydroxy Latest Ref Range: 30.0 - 100.0 ng/mL 11.0 (L)   ASSESSMENT AND PLAN: Vitamin D deficiency - Plan: Vitamin D, Ergocalciferol, (DRISDOL) 50000 units CAPS capsule  Prediabetes  Chronic diastolic CHF (congestive heart failure) (HCC)  At risk for osteoporosis  Class 3 severe obesity with serious comorbidity and body mass index (BMI) of 45.0 to 49.9 in adult, unspecified obesity type (HCC)  PLAN:  Vitamin D Deficiency Amber Bentley was informed that low vitamin D levels contributes to fatigue and are associated with obesity, breast, and colon cancer. She agrees to continue to take prescription Vit D @50 ,000 IU every week #4 with no refills and will follow up for routine testing of vitamin D, at least 2-3 times per year. She was informed of the risk of over-replacement of vitamin D and agrees to not increase her dose unless she discusses this with [name] first. Amber Bentley agrees to follow up with our clinic in 2 weeks.  At risk for osteopenia and osteoporosis Mercadies is at risk  for osteopenia and osteoporosis due to her vitamin D deficiency. She was encouraged to take her vitamin D and follow her higher calcium diet and increase strengthening exercise to help strengthen her bones and decrease her risk of osteopenia and osteoporosis.  Pre-Diabetes Amber Bentley will continue to work on weight loss, exercise, and decreasing simple carbohydrates in her diet to help decrease the risk of diabetes. We dicussed metformin including benefits and risks. She was informed that eating too many simple carbohydrates or too many calories at one sitting increases the likelihood of GI side effects. Amber Bentley agrees to continue  metformin 500 mg and follow up with [name] as directed to monitor her progress.  Congestive Heart Failure Amber Bentley is to discuss medication adjustment with her cardiologist. She agrees to follow up with our clinic in 2 weeks.  Obesity Amber Bentley is currently in the action stage of change. As such, her goal is to continue with weight loss efforts She has agreed to keep a food journal with 1600 calories and 90 grams of protein daily Amber Bentley has been instructed to work up to a goal of 150 minutes of combined cardio and strengthening exercise per week for weight loss and overall health benefits. We discussed the following Behavioral Modification Strategies today: planning for success, keep a strict food journal, increasing lean protein intake, decreasing simple carbohydrates  and work on meal planning and easy cooking plans  Amber Bentley has agreed to follow up with our clinic in 2 weeks. She was informed of the importance of frequent follow up visits to maximize her success with intensive lifestyle modifications for her multiple health conditions.   OBESITY BEHAVIORAL INTERVENTION VISIT  Today's visit was # 8 out of 22.  Starting weight: 351 lbs Starting date: 12/30/17 Today's weight : 311 Today's date: 04/15/2018 Total lbs lost to date: 40 (Patients must lose 7 lbs in the first 6 months to  continue with counseling)   ASK: We discussed the diagnosis of obesity with Amber Bentley today and Amber Bentley agreed to give [name] permission to discuss obesity behavioral modification therapy today.  ASSESS: Amber Bentley has the diagnosis of obesity and her BMI today is 51.82 Amber Bentley is in the action stage of change   ADVISE: Amber Bentley was educated on the multiple health risks of obesity as well as the benefit of weight loss to improve her health. She was advised of the need for long term treatment and the importance of lifestyle modifications.  AGREE: Multiple dietary modification options and treatment options were discussed and  Amber Bentley agreed to the above obesity treatment plan.  I, Nevada Crane, am acting as transcriptionist for Filbert Schilder, MD  I have reviewed the above documentation for accuracy and completeness, and I agree with the above. - Debbra Riding, MD

## 2018-04-28 DIAGNOSIS — I5041 Acute combined systolic (congestive) and diastolic (congestive) heart failure: Secondary | ICD-10-CM | POA: Diagnosis not present

## 2018-04-28 DIAGNOSIS — I5032 Chronic diastolic (congestive) heart failure: Secondary | ICD-10-CM | POA: Diagnosis not present

## 2018-04-28 DIAGNOSIS — I159 Secondary hypertension, unspecified: Secondary | ICD-10-CM | POA: Diagnosis not present

## 2018-05-01 ENCOUNTER — Ambulatory Visit (INDEPENDENT_AMBULATORY_CARE_PROVIDER_SITE_OTHER): Payer: 59 | Admitting: Family Medicine

## 2018-05-01 VITALS — BP 141/85 | HR 50 | Temp 98.0°F | Ht 65.0 in | Wt 307.0 lb

## 2018-05-01 DIAGNOSIS — I5032 Chronic diastolic (congestive) heart failure: Secondary | ICD-10-CM

## 2018-05-01 DIAGNOSIS — T148XXA Other injury of unspecified body region, initial encounter: Secondary | ICD-10-CM

## 2018-05-01 DIAGNOSIS — Z6841 Body Mass Index (BMI) 40.0 and over, adult: Secondary | ICD-10-CM | POA: Diagnosis not present

## 2018-05-01 DIAGNOSIS — E66813 Obesity, class 3: Secondary | ICD-10-CM

## 2018-05-01 DIAGNOSIS — R7303 Prediabetes: Secondary | ICD-10-CM | POA: Diagnosis not present

## 2018-05-02 ENCOUNTER — Encounter (HOSPITAL_COMMUNITY): Payer: Self-pay | Admitting: *Deleted

## 2018-05-02 ENCOUNTER — Ambulatory Visit (HOSPITAL_COMMUNITY)
Admission: EM | Admit: 2018-05-02 | Discharge: 2018-05-02 | Disposition: A | Discharge status: Home / Self Care | Payer: 59 | Attending: Family Medicine | Admitting: Family Medicine

## 2018-05-02 ENCOUNTER — Ambulatory Visit (INDEPENDENT_AMBULATORY_CARE_PROVIDER_SITE_OTHER): Payer: 59

## 2018-05-02 DIAGNOSIS — S92352A Displaced fracture of fifth metatarsal bone, left foot, initial encounter for closed fracture: Secondary | ICD-10-CM

## 2018-05-02 DIAGNOSIS — S92351A Displaced fracture of fifth metatarsal bone, right foot, initial encounter for closed fracture: Secondary | ICD-10-CM | POA: Diagnosis not present

## 2018-05-02 NOTE — ED Provider Notes (Signed)
[facility]    CSN: [id number] Arrival date & time: 05/02/18  1849     History   Chief Complaint Chief Complaint  Patient presents with  . Fall  . Foot Injury    HPI Amber Bentley is a 42 y.o. female.   HPI  She is here for left foot pain.  She states she fell 1/3 and rolled her foot over, injuring the lateral foot.  Is been swollen and painful ever since.  She is been trying to walk on it.  She saw her primary care physician today and they recommended she come for an x-ray. No prior x-rays, no prior care for this injury of the foot.  No osteoporosis or bone disorder.  No prior fractures or foot problems.   Past Medical History:  Diagnosis Date  . Anemia   . Anxiety   . Cardiomegaly   . Chest pain   . CHF (congestive heart failure) (HCC)   . Constipation   . Depression   . Dyspnea   . Fatigue   . Food allergy    celery  . GERD (gastroesophageal reflux disease)   . Hip pain   . HLD (hyperlipidemia)   . HTN (hypertension)   . Infertility, female   . Lactose intolerance   . Leg edema   . Lower back pain   . OSA on CPAP   . Palpitations   . Prediabetes   . Shoulder pain   . Stomach ulcer   . Varicose vein of leg     Patient Active Problem List   Diagnosis Date Noted  . Vitamin D deficiency 01/14/2018  . Prediabetes 01/14/2018  . Essential hypertension 07/30/2017  . OSA (obstructive sleep apnea) 07/20/2017  . Nasal sore 07/18/2017  . Chronic diastolic CHF (congestive heart failure) (HCC) 03/01/2017  . Pulmonary hypertension (HCC) 03/01/2017  . Morbid obesity (HCC)   . SOB (shortness of breath)   . Migraine without status migrainosus, not intractable   . Altered mental state   . Hypertensive cardiomyopathy, 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HCC)   . Congestive heart failure (HCC)   . Pulmonary edema   . 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 HTN (hypertension), benign 02/21/2017  . Adjustment disorder with mixed anxiety and depressed mood 02/21/2017    . 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 Hypertensive emergency 02/21/2017  . Tobacco abuse 02/21/2017  . Chest pain 02/21/2017  . Anxiety and depression 02/21/2017    Past Surgical History:  Procedure Laterality Date  . ABDOMINAL HYSTERECTOMY    . ENDOMETRIAL ABLATION  2007   prior to hysterectomy    OB History   None      Home Medications    Prior to Admission medications   Medication Sig Start Date End Date Taking? Authorizing Provider  carvedilol (COREG) 6.25 MG tablet Take 1 tablet (6.25 mg total) by mouth 2 (two) times daily. 04/16/18   Nahser, Deloris Ping, MD  lisinopril (PRINIVIL,ZESTRIL) 20 MG tablet TAKE 1 TABLET BY MOUTH ONCE DAILY 01/31/18   Nche, Bonna Gains, NP  metFORMIN (GLUCOPHAGE) 500 MG tablet TAKE 1 TABLET BY MOUTH ONCE DAILY WITH BREAKFAST 04/07/18   Filbert Schilder, MD  pantoprazole (PROTONIX) 40 MG tablet TAKE 1 TABLET BY MOUTH TWICE DAILY 02/26/18   Nche, Bonna Gains, NP  rosuvastatin (CRESTOR) 10 MG tablet Take 1 tablet (10 mg total) by mouth daily. 04/10/17 04/16/18  Nahser, Deloris Ping, MD  spironolactone (ALDACTONE) 25 MG tablet  Take 25 mg by mouth daily.    [provider]  Vitamin D, Ergocalciferol, (DRISDOL) 50000 units CAPS capsule Take 1 capsule (50,000 Units total) by mouth every 7 (seven) days. 04/15/18   Filbert Schilder, MD    Family History Family History  Problem Relation Age of Onset  . Hypertension Mother   . Cancer Mother        breast cancer  . Kidney disease Mother   . Depression Mother   . Obesity Mother   . Diabetes Father   . Heart disease Father 19  . Hyperlipidemia Father   . Hypertension Father   . Depression Father   . Anxiety disorder Father   . Sleep apnea Father   . Obesity Father   . Thyroid disease Sister   . Sudden death Brother   . Cancer Maternal Aunt        breast cancer  . Cancer Paternal Grandfather        brain cancer    Social History Social  History   Tobacco Use  . Smoking status: Former Smoker    Packs/day: 0.25    Years: 10.00    Pack years: 2.50    Types: Cigarettes  . Smokeless tobacco: Never Used  Substance Use Topics  . Alcohol use: Yes    Comment: rarely  . Drug use: No     Allergies   Food and Aleve [naproxen]   Review of Systems Review of Systems  Constitutional: Negative for chills and fever.  HENT: Negative for ear pain and sore throat.   Eyes: Negative for pain and visual disturbance.  Respiratory: Negative for cough and shortness of breath.   Cardiovascular: Negative for chest pain and palpitations.  Gastrointestinal: Negative for abdominal pain and vomiting.  Genitourinary: Negative for dysuria and hematuria.  Musculoskeletal: Positive for gait problem. Negative for arthralgias and back pain.  Skin: Negative for color change and rash.  Neurological: Negative for seizures and syncope.  All other systems reviewed and are negative.    Physical Exam Triage Vital Signs ED Triage Vitals  Enc Vitals Group     BP 05/02/18 1925 136/82     Pulse Rate 05/02/18 1925 (!) 55     Resp 05/02/18 1925 18     Temp 05/02/18 1925 98.6 F (37 C)     Temp Source 05/02/18 1925 Oral     SpO2 05/02/18 1925 100 %     Weight --      Height --      Head Circumference --      Peak Flow --      Pain Score 05/02/18 1928 6     Pain Loc --      Pain Edu? --      Excl. in GC? --    No data found.  Updated Vital Signs BP 136/82 (BP Location: Left Arm)   Pulse (!) 55   Temp 98.6 F (37 C) (Oral)   Resp 18   SpO2 100%   Visual Acuity Right Eye Distance:   Left Eye Distance:   Bilateral Distance:    Right Eye Near:   Left Eye Near:    Bilateral Near:     Physical Exam  Constitutional: She appears well-developed and well-nourished. No distress.  HENT:  Head: Normocephalic and atraumatic.  Mouth/Throat: Oropharynx is clear and moist.  Eyes: Conjunctivae are normal.  Neck: Neck supple.    Cardiovascular: Normal rate and regular rhythm.  No murmur heard. Pulmonary/Chest:  Effort normal and breath sounds normal. No respiratory distress.  Abdominal: Soft. There is no tenderness.  Musculoskeletal: She exhibits no edema.  Left foot has swelling across the dorsum.  Point tenderness over the mid fifth metatarsal.  No obvious deformity  Neurological: She is alert.  Skin: Skin is warm and dry.  Psychiatric: She has a normal mood and affect.  Nursing note and vitals reviewed.    UC Treatments / Results  Labs (all labs ordered are listed, but only abnormal results are displayed) Labs Reviewed - No data to display  EKG None  Radiology Dg Foot Complete Left  Result Date: 05/02/2018 CLINICAL DATA:  Acute LEFT foot pain following fall 2 weeks ago. Initial encounter. EXAM: LEFT FOOT - COMPLETE 3+ VIEW COMPARISON:  None. FINDINGS: A mildly comminuted oblique fracture of the mid fifth metatarsal is noted with 5 mm dorsal/medial displacement. There is no evidence of subluxation or dislocation. No other bony abnormalities are noted. Soft tissue swelling is present. IMPRESSION: Mildly comminuted oblique fracture of the mid fifth metatarsal with 5 mm dorsal/medial displacement. Electronically Signed   By: Harmon Pier M.D.   On: 05/02/2018 19:50    Procedures Procedures (including critical care time)  Medications Ordered in UC Medications - No data to display  Initial Impression / Assessment and Plan / UC Course  I have reviewed the triage vital signs and the nursing notes.  Pertinent labs & imaging results that were available during my care of the patient were reviewed by me and considered in my medical decision making (see chart for details).      Final Clinical Impressions(s) / UC Diagnoses   Final diagnoses:  Displaced fracture of fifth metatarsal bone, left foot, initial encounter for closed fracture     Discharge Instructions     NO WEIGHT ON LEFT FOOT WITHOUT  BOOT Elevate to reduce the swelling Activity as tolerated See Darien Ramus in follow up   ED Prescriptions    None     Controlled Substance Prescriptions [place] Controlled Substance Registry consulted? Not Applicable   Eustace Moore, MD 05/02/18 2018

## 2018-05-02 NOTE — Discharge Instructions (Signed)
NO WEIGHT ON LEFT FOOT WITHOUT BOOT Elevate to reduce the swelling Activity as tolerated See Darien Ramus in follow up

## 2018-05-02 NOTE — ED Triage Notes (Signed)
Patient sent to urgent care to get xray of left foot after falling 2 weeks ago on April 3rd.   Patient reports PCP suggested xray to rule out fracture of 5th metatarsal.   Patient reports pain, swelling, and minimal bruising (states there was a lot initially)

## 2018-05-05 NOTE — Progress Notes (Signed)
Office: [phone]  /  Fax: [phone]   HPI:   Chief Complaint: OBESITY Barby is here to discuss her progress with her obesity treatment plan. She is on the keep a food journal with 1600 calories and 90 grams of protein daily and is following her eating plan approximately 85 % of the time. She states she is exercising 0 minutes 0 times per week. Miku sprained her left foot two weeks ago. Some days Burnice is not meeting 1600 calories and is saving too many calories for later in the day, and then she can't get all her calories in. Her weight is (!) 307 lb (139.3 kg) today and has had a weight gain of 1 pound over a period of 2 weeks since her last visit. She has gained 44 lb since starting treatment with [name].  Pre-Diabetes Inioluwa has a diagnosis of prediabetes based on her elevated Hgb A1c and was informed this puts her at greater risk of developing diabetes. She is taking metformin currently and continues to work on diet and exercise to decrease risk of diabetes. She denies carb cravings or hypoglycemia.  Congestive Heart Failure Tema has a diagnosis of congestive heart failure. She was taken off amlodipine and carvedilol was decreased by half, by her cardiologist.  Foot Sprain Calandra has a diagnosis of left foot sprain. She has tenderness at the proximal fifth  Metatarsal. She is positive for swelling and inability to bear weight.  ALLERGIES: Allergies  Allergen Reactions  . 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   [name] [Naproxen] Other (See Comments)    Pt states that this medication makes her loopy.     MEDICATIONS: Current Outpatient Medications on File Prior to Visit  Medication Sig Dispense Refill  . carvedilol (COREG) 6.25 MG tablet Take 1 tablet (6.25 mg total) by mouth 2 (two) times daily. 180 tablet 3  . lisinopril (PRINIVIL,ZESTRIL) 20 MG tablet TAKE 1 TABLET BY MOUTH ONCE DAILY 90 tablet 1  . metFORMIN (GLUCOPHAGE) 500 MG tablet  TAKE 1 TABLET BY MOUTH ONCE DAILY WITH BREAKFAST 30 tablet 0  . pantoprazole (PROTONIX) 40 MG tablet TAKE 1 TABLET BY MOUTH TWICE DAILY 180 tablet 1  . spironolactone (ALDACTONE) 25 MG tablet Take 25 mg by mouth daily.    . Vitamin D, Ergocalciferol, (DRISDOL) 50000 units CAPS capsule Take 1 capsule (50,000 Units total) by mouth every 7 (seven) days. 4 capsule 0  . rosuvastatin (CRESTOR) 10 MG tablet Take 1 tablet (10 mg total) by mouth daily. 90 tablet 3   No current facility-administered medications on file prior to visit.     PAST MEDICAL HISTORY: Past Medical History:  Diagnosis Date  . Anemia   . Anxiety   . Cardiomegaly   . Chest pain   . CHF (congestive heart failure) (HCC)   . Constipation   . Depression   . Dyspnea   . Fatigue   . Food allergy    celery  . GERD (gastroesophageal reflux disease)   . Hip pain   . HLD (hyperlipidemia)   . HTN (hypertension)   . Infertility, female   . Lactose intolerance   . Leg edema   . Lower back pain   . OSA on CPAP   . Palpitations   . Prediabetes   . Shoulder pain   . Stomach ulcer   . Varicose vein of leg     PAST SURGICAL HISTORY: Past Surgical History:  Procedure  Laterality Date  . ABDOMINAL HYSTERECTOMY    . ENDOMETRIAL ABLATION  2007   prior to hysterectomy    SOCIAL HISTORY: Social History   Tobacco Use  . Smoking status: Former Smoker    Packs/day: 0.25    Years: 10.00    Pack years: 2.50    Types: Cigarettes  . Smokeless tobacco: Never Used  Substance Use Topics  . Alcohol use: Yes    Comment: rarely  . Drug use: No    FAMILY HISTORY: Family History  Problem Relation Age of Onset  . Hypertension Mother   . Cancer Mother        breast cancer  . Kidney disease Mother   . Depression Mother   . Obesity Mother   . Diabetes Father   . Heart disease Father 49  . Hyperlipidemia Father   . Hypertension Father   . Depression Father   . Anxiety disorder Father   . Sleep apnea Father   . Obesity  Father   . Thyroid disease Sister   . Sudden death Brother   . Cancer Maternal Aunt        breast cancer  . Cancer Paternal Grandfather        brain cancer    ROS: Review of Systems  Constitutional: Negative for weight loss.  Musculoskeletal:       Positive for left foot swelling Positive for left foot tenderness  Endo/Heme/Allergies:       Negative for carb cravings Negative for hypoglycemia    PHYSICAL EXAM: Blood pressure (!) 141/85, pulse (!) 50, temperature 98 F (36.7 C), temperature source Oral, height 5\' 5"  (1.651 m), weight (!) 307 lb (139.3 kg), SpO2 98 %. Body mass index is 51.09 kg/m. Physical Exam  Constitutional: She is oriented to person, place, and time. She appears well-developed and well-nourished.  Cardiovascular: Normal rate.  Pulmonary/Chest: Effort normal.  Musculoskeletal: Normal range of motion.  Positive for left foot swelling Positive for left foot tenderness Positive for inability to bear weight on left foot  Neurological: She is oriented to person, place, and time.  Skin: Skin is warm and dry.  Psychiatric: She has a normal mood and affect. Her behavior is normal.  Vitals reviewed.   RECENT LABS AND TESTS: BMET    Component Value Date/Time   NA 141 12/30/2017 1147   K 4.9 12/30/2017 1147   CL 102 12/30/2017 1147   CO2 23 12/30/2017 1147   GLUCOSE 108 (H) 12/30/2017 1147   GLUCOSE 113 (H) 06/20/2017 0856   BUN 17 12/30/2017 1147   CREATININE 0.87 12/30/2017 1147   CALCIUM 9.3 12/30/2017 1147   GFRNONAA 84 12/30/2017 1147   GFRAA 97 12/30/2017 1147   Lab Results  Component Value Date   HGBA1C 6.0 (H) 12/30/2017   HGBA1C 5.8 02/21/2017   Lab Results  Component Value Date   INSULIN 89.7 (H) 12/30/2017   CBC    Component Value Date/Time   WBC 9.3 12/30/2017 1147   WBC 9.6 06/20/2017 0856   RBC 5.18 12/30/2017 1147   RBC 5.04 06/20/2017 0856   HGB 13.4 12/30/2017 1147   HCT 43.1 12/30/2017 1147   PLT 224.0 06/20/2017 0856     MCV 83 12/30/2017 1147   MCH 25.9 (L) 12/30/2017 1147   MCH 24.1 (L) 04/27/2017 2055   MCHC 31.1 (L) 12/30/2017 1147   MCHC 32.7 06/20/2017 0856   RDW 13.6 12/30/2017 1147   LYMPHSABS 2.0 12/30/2017 1147   MONOABS 0.6 02/21/2017  1510   EOSABS 0.2 12/30/2017 1147   BASOSABS 0.0 12/30/2017 1147   Iron/TIBC/Ferritin/ %Sat    Component Value Date/Time   IRON 85 06/20/2017 0856   TIBC 344 06/20/2017 0856   FERRITIN 55.4 06/20/2017 0856   IRONPCTSAT 25 06/20/2017 0856   Lipid Panel     Component Value Date/Time   CHOL 120 12/30/2017 1147   TRIG 65 12/30/2017 1147   HDL 45 12/30/2017 1147   CHOLHDL 3.3 04/08/2017 1124   CHOLHDL 4.7 02/22/2017 0420   VLDL 11 02/22/2017 0420   LDLCALC 62 12/30/2017 1147   Hepatic Function Panel     Component Value Date/Time   PROT 7.3 12/30/2017 1147   ALBUMIN 4.5 12/30/2017 1147   AST 24 12/30/2017 1147   ALT 28 12/30/2017 1147   ALKPHOS 70 12/30/2017 1147   BILITOT 0.3 12/30/2017 1147   BILIDIR 0.4 04/14/2017 0315   IBILI 0.7 04/14/2017 0315      Component Value Date/Time   TSH 1.270 12/30/2017 1147   TSH 3.01 06/20/2017 0856   TSH 4.78 (H) 02/21/2017 1510   Results for MAKALLA, GROPP (MRN [id number]) as of 05/05/2018 09:06  Ref. Range 12/30/2017 11:47  Vitamin D, 25-Hydroxy Latest Ref Range: 30.0 - 100.0 ng/mL 11.0 (L)   ASSESSMENT AND PLAN: Prediabetes  Chronic diastolic CHF (congestive heart failure) (HCC)  Sprain - foot  Class 3 severe obesity with serious comorbidity and body mass index (BMI) of 45.0 to 49.9 in adult, unspecified obesity type (HCC)  PLAN:  Pre-Diabetes Latisha will continue to work on weight loss, exercise, and decreasing simple carbohydrates in her diet to help decrease the risk of diabetes. We dicussed metformin including benefits and risks. She was informed that eating too many simple carbohydrates or too many calories at one sitting increases the likelihood of GI side effects. Gabija agrees to continue  metformin 500 mg daily (no refill needed) and follow up with [name] as directed to monitor her progress.  Congestive Heart Failure Khamiya is to follow up with her cardiologist in 3 months. She will continue her current medications as prescribed and  follow up with our clinic in 2 to 3 weeks.  Foot Sprain Esmirna was encouraged to seek xray evaluation for fifth metatarsal fracture. Elton will follow up with our clinic in 2 to 3 weeks.  We spent > than 50% of the 15 minute visit on the counseling as documented in the note.  Obesity  Sabena is currently in the action stage of change. As such, her goal is to continue with weight loss efforts She has agreed to keep a food journal with 1600 calories and 90+ grams of protein daily Ralyn has been instructed to work up to a goal of 150 minutes of combined cardio and strengthening exercise per week for weight loss and overall health benefits. We discussed the following Behavioral Modification Strategies today: better snacking choices, keep a strict food journal, planning for success, increasing lean protein intake and work on meal planning and easy cooking plans  Mayley has agreed to follow up with our clinic in 2 to 3 weeks. She was informed of the importance of frequent follow up visits to maximize her success with intensive lifestyle modifications for her multiple health conditions.   OBESITY BEHAVIORAL INTERVENTION VISIT  Today's visit was # 9 out of 22.  Starting weight: 351 lbs Starting date: 12/30/17 Today's weight : 307 lbs Today's date: 05/01/2018 Total lbs lost to date: 25 (Patients must lose 7 lbs in  the first 6 months to continue with counseling)   ASK: We discussed the diagnosis of obesity with Beverly Sessions today and Miana agreed to give [name] permission to discuss obesity behavioral modification therapy today.  ASSESS: Tameka has the diagnosis of obesity and her BMI today is 51.09 Lyndy is in the action stage of change   ADVISE: Barbarann was  educated on the multiple health risks of obesity as well as the benefit of weight loss to improve her health. She was advised of the need for long term treatment and the importance of lifestyle modifications.  AGREE: Multiple dietary modification options and treatment options were discussed and  Fate agreed to the above obesity treatment plan.  I, [place] Crane, am acting as transcriptionist for Filbert Schilder, MD  I have reviewed the above documentation for accuracy and completeness, and I agree with the above. - Debbra Riding, MD

## 2018-05-06 IMAGING — DX DG CHEST 2V
2 series · 2 of 2 positions shown · non-contrast
Comparison: None.

CLINICAL DATA: 40 y/o female with cough fatigue congestion and
fluid retention for 1 month. Initial encounter. Smoker.

EXAM:
CHEST  2 VIEW

[chest pa]
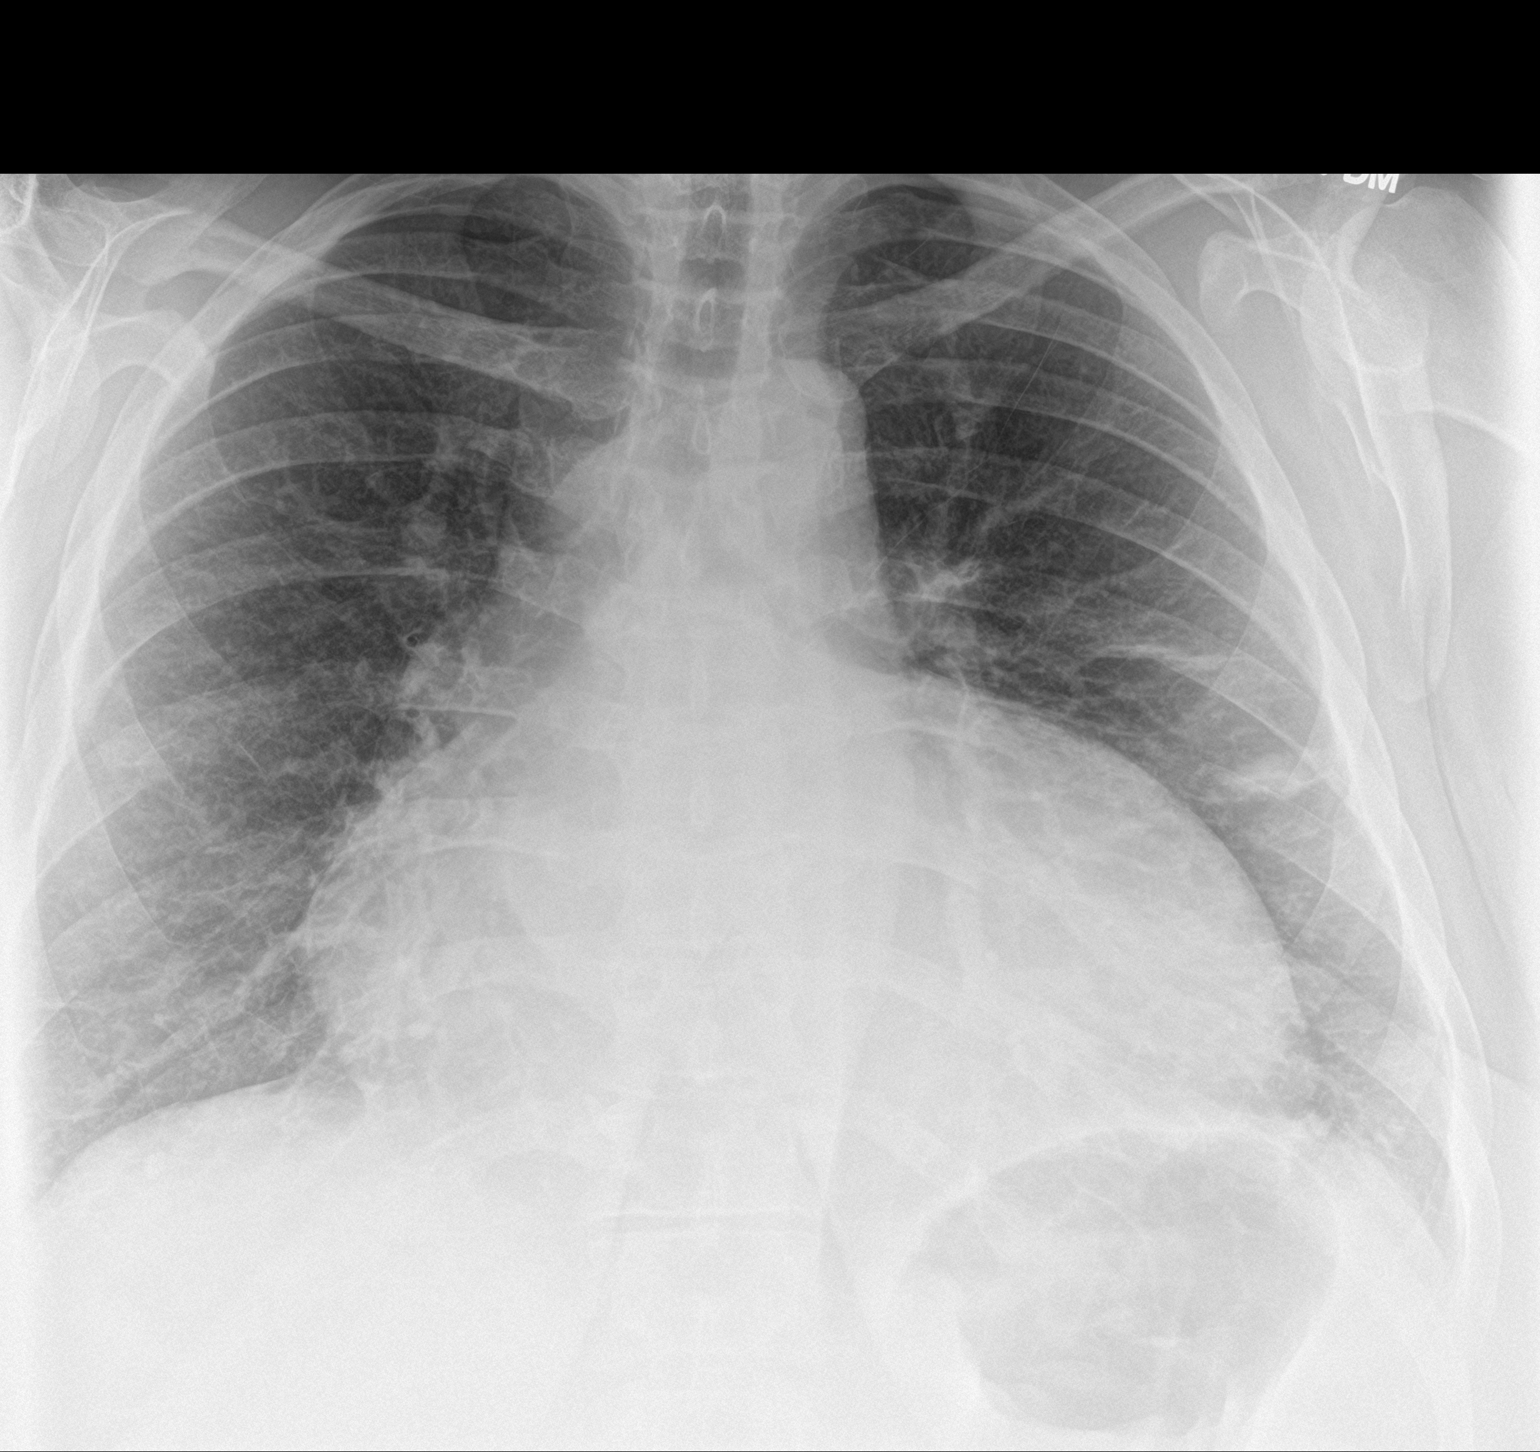

[chest lat]
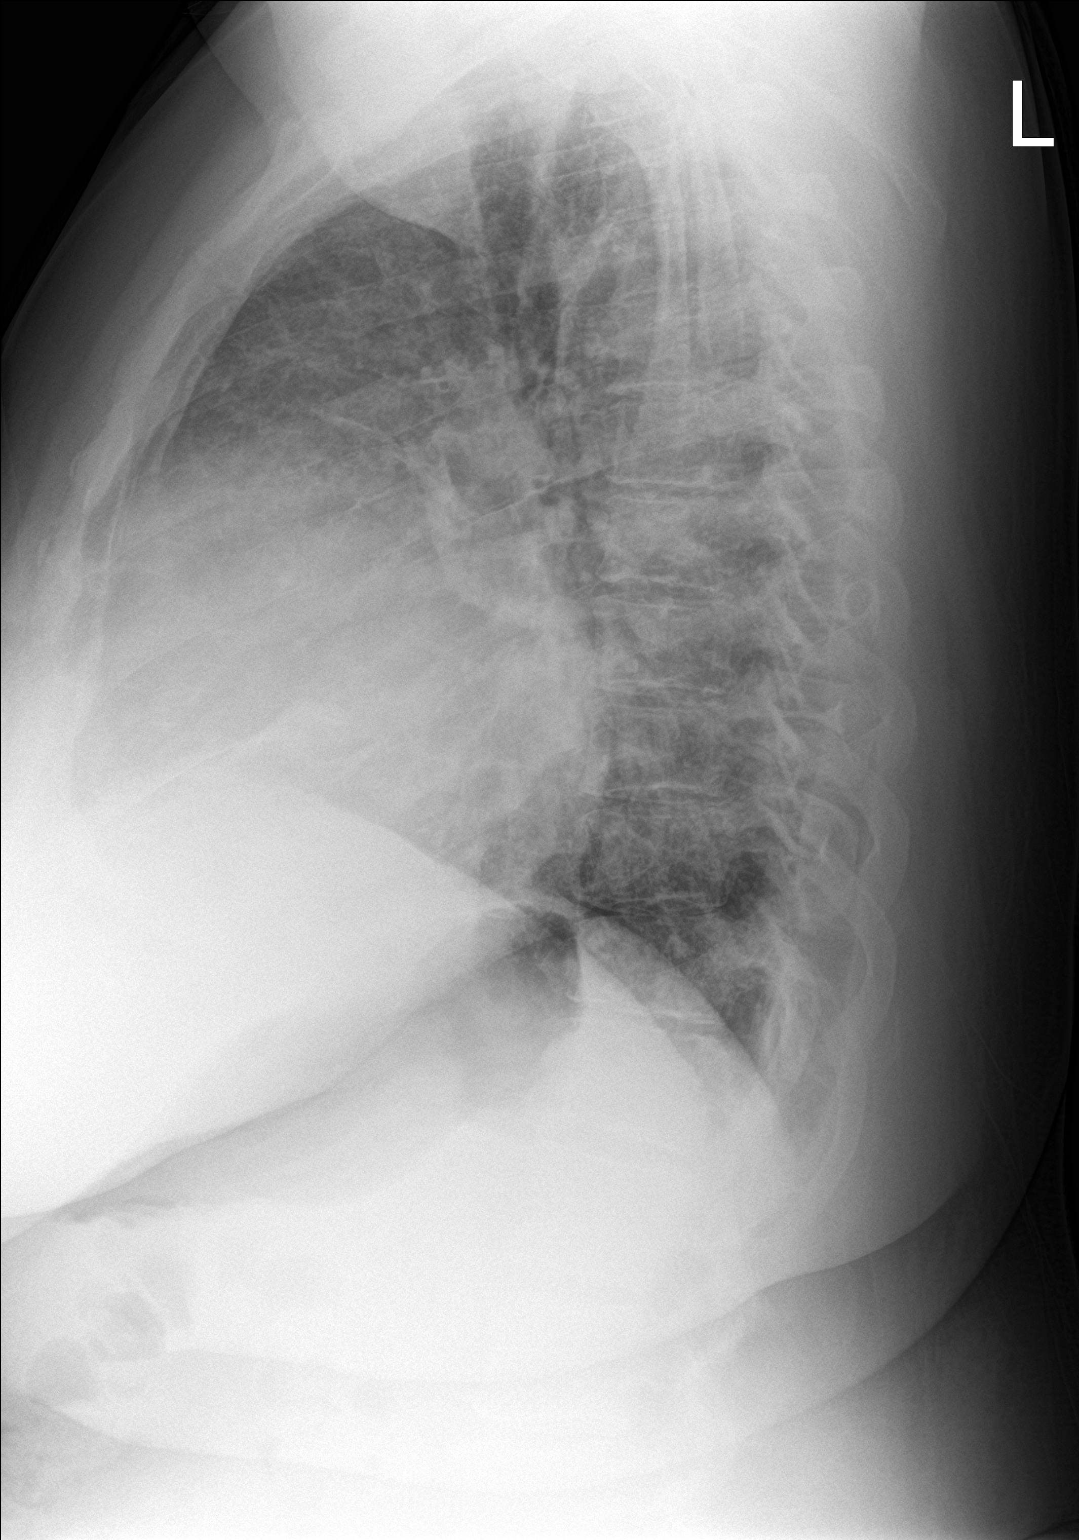

[2 of 2 positions shown; findings below may reference images not displayed]

FINDINGS: Large body habitus. Moderate to severe cardiomegaly. No
pneumothorax, pleural effusion or consolidation, but there is
peripheral linear increased mostly interstitial appearing opacity in
both lungs, more apparent on the left. No acute osseous abnormality
identified. Negative visible bowel gas pattern.
IMPRESSION: 1. Mild pulmonary interstitial edema suspected. No pleural effusion.
2. Moderate to severe cardiomegaly.  Consider pericardial effusion.

## 2018-05-07 ENCOUNTER — Ambulatory Visit (INDEPENDENT_AMBULATORY_CARE_PROVIDER_SITE_OTHER): Payer: 59 | Admitting: Family

## 2018-05-07 DIAGNOSIS — S93402A Sprain of unspecified ligament of left ankle, initial encounter: Secondary | ICD-10-CM | POA: Diagnosis not present

## 2018-05-07 DIAGNOSIS — S92352A Displaced fracture of fifth metatarsal bone, left foot, initial encounter for closed fracture: Secondary | ICD-10-CM | POA: Diagnosis not present

## 2018-05-08 ENCOUNTER — Encounter (INDEPENDENT_AMBULATORY_CARE_PROVIDER_SITE_OTHER): Payer: Self-pay | Admitting: Family

## 2018-05-08 NOTE — Progress Notes (Signed)
Office Visit Note   Patient: Amber Bentley           Date of Birth: 04/14/1976           MRN: [id number] Visit Date: 05/07/2018              Requested by: Anne Ng, NP [address] [place], [place] [address] PCP: Anne Ng, NP  No chief complaint on file.     HPI: The patient is a 42 year old woman seen today for initial evaluation of left foot fracture following a fall about 2 weeks ago. Sought initial treatment 2 weeks following fall. Had been limping and weight bearing on foot. Complains of left lateral foot and ankle pain today. Today presents in CAM walker, seen and  Evaluated in urgent care for the same a few days ago and placed in boot. Denies pain with ambulation in CAM. Some swelling, states is improving.   Assessment & Plan: Visit Diagnoses:  1. Displaced fracture of fifth metatarsal bone, left foot, initial encounter for closed fracture     Plan: continue CAM. Minimize weight bearing. If ambulation painful is to nonweight bear. Follow up in office in 2 weeks with repeat radiographs   Follow-Up Instructions: Return in about 2 weeks (around 05/21/2018).   Left Ankle Exam   Tenderness  The patient is experiencing tenderness in the ATF and CF.  Swelling: mild  Tests  Anterior drawer: negative  Other  Erythema: absent Sensation: normal Pulse: present  Comments:  Tenderness along 5th MT shaft      Patient is alert, oriented, no adenopathy, well-dressed, normal affect, normal respiratory effort.   Imaging: No results found. No images are attached to the encounter.  Labs: Lab Results  Component Value Date   HGBA1C 6.0 (H) 12/30/2017   HGBA1C 5.8 02/21/2017     Lab Results  Component Value Date   ALBUMIN 4.5 12/30/2017   ALBUMIN 3.8 04/14/2017   ALBUMIN 4.3 04/08/2017    There is no height or weight on file to calculate BMI.  Orders:  No orders of the defined types were placed in this encounter.  No orders of the  defined types were placed in this encounter.    Procedures: No procedures performed  Clinical Data: No additional findings.  ROS:  All other systems negative, except as noted in the HPI. Review of Systems  Constitutional: Negative for chills and fever.  Musculoskeletal: Positive for arthralgias and joint swelling.  Skin: Negative for color change and wound.    Objective: Vital Signs: There were no vitals taken for this visit.  Specialty Comments:  No specialty comments available.  PMFS History: Patient Active Problem List   Diagnosis Date Noted  . Vitamin D deficiency 01/14/2018  . Prediabetes 01/14/2018  . Essential hypertension 07/30/2017  . OSA (obstructive sleep apnea) 07/20/2017  . Nasal sore 07/18/2017  . Chronic diastolic CHF (congestive heart failure) (HCC) 03/01/2017  . Pulmonary hypertension (HCC) 03/01/2017  . Morbid obesity (HCC)   . SOB (shortness of breath)   . Migraine without status migrainosus, not intractable   . Altered mental state   . Hypertensive cardiomyopathy, 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HCC)   . Congestive heart failure (HCC)   . Pulmonary edema   . 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 HTN (hypertension), benign 02/21/2017  . Adjustment disorder with mixed anxiety and depressed mood 02/21/2017  . 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 Hypertensive emergency 02/21/2017  .  Tobacco abuse 02/21/2017  . Chest pain 02/21/2017  . Anxiety and depression 02/21/2017   Past Medical History:  Diagnosis Date  . Anemia   . Anxiety   . Cardiomegaly   . Chest pain   . CHF (congestive heart failure) (HCC)   . Constipation   . Depression   . Dyspnea   . Fatigue   . Food allergy    celery  . GERD (gastroesophageal reflux disease)   . Hip pain   . HLD (hyperlipidemia)   . HTN (hypertension)   . Infertility, female   . Lactose intolerance   . Leg edema   . Lower back pain   . OSA on CPAP   .  Palpitations   . Prediabetes   . Shoulder pain   . Stomach ulcer   . Varicose vein of leg     Family History  Problem Relation Age of Onset  . Hypertension Mother   . Cancer Mother        breast cancer  . Kidney disease Mother   . Depression Mother   . Obesity Mother   . Diabetes Father   . Heart disease Father 1  . Hyperlipidemia Father   . Hypertension Father   . Depression Father   . Anxiety disorder Father   . Sleep apnea Father   . Obesity Father   . Thyroid disease Sister   . Sudden death Brother   . Cancer Maternal Aunt        breast cancer  . Cancer Paternal Grandfather        brain cancer    Past Surgical History:  Procedure Laterality Date  . ABDOMINAL HYSTERECTOMY    . ENDOMETRIAL ABLATION  2007   prior to hysterectomy   Social History   Occupational History  . Occupation: [employer]  Tobacco Use  . Smoking status: Former Smoker    Packs/day: 0.25    Years: 10.00    Pack years: 2.50    Types: Cigarettes  . Smokeless tobacco: Never Used  Substance and Sexual Activity  . Alcohol use: Yes    Comment: rarely  . Drug use: No  . Sexual activity: Not on file

## 2018-05-20 ENCOUNTER — Ambulatory Visit (INDEPENDENT_AMBULATORY_CARE_PROVIDER_SITE_OTHER): Payer: 59

## 2018-05-20 ENCOUNTER — Encounter (INDEPENDENT_AMBULATORY_CARE_PROVIDER_SITE_OTHER): Payer: Self-pay | Admitting: Orthopedic Surgery

## 2018-05-20 ENCOUNTER — Ambulatory Visit (INDEPENDENT_AMBULATORY_CARE_PROVIDER_SITE_OTHER): Payer: 59 | Admitting: Orthopedic Surgery

## 2018-05-20 VITALS — Ht 65.0 in | Wt 307.0 lb

## 2018-05-20 DIAGNOSIS — S92352A Displaced fracture of fifth metatarsal bone, left foot, initial encounter for closed fracture: Secondary | ICD-10-CM

## 2018-05-20 NOTE — Progress Notes (Signed)
Office Visit Note   Patient: Amber Bentley           Date of Birth: 1976-12-14           MRN: [id number] Visit Date: 05/20/2018              Requested by: Anne Ng, NP [address] [place], [place] [address] PCP: Anne Ng, NP  Chief Complaint  Patient presents with  . Left Foot - Follow-up    Left foot 5th MT fx       HPI: Patient is a 42 year old woman with a displaced spiral fracture fifth metatarsal left foot.  Patient is currently in a fracture boot.  Review of her medication shows that she is on 50,000 units of vitamin D3 a week.  Patient has no pain with ambulating in the boot.  Assessment & Plan: Visit Diagnoses:  1. Displaced fracture of fifth metatarsal bone, left foot, initial encounter for closed fracture     Plan: Discussed that she will need to be immobilized for 4 additional weeks.  Discussed that she could continue with the boot or she could get a pair of Trail running sneakers or hiking sneakers which are stiff and do not flex.  At follow-up in 3 weeks repeat 2 view radiographs of the left foot at which time anticipate she can be released without restrictions.  Follow-Up Instructions: Return in about 1 month (around 06/17/2018).   Ortho Exam  Patient is alert, oriented, no adenopathy, well-dressed, normal affect, normal respiratory effort. Examination patient's foot is plantigrade there is no redness no cellulitis no signs of infection.  She is asymptomatic with walking with the boot.  Imaging: Xr Foot 2 Views Left  Result Date: 05/20/2018 2 view radiographs of the left foot shows a spiral fracture midshaft left fifth metatarsal no callus formation at this time.  There is stable alignment.  No images are attached to the encounter.  Labs: Lab Results  Component Value Date   HGBA1C 6.0 (H) 12/30/2017   HGBA1C 5.8 02/21/2017     Lab Results  Component Value Date   ALBUMIN 4.5 12/30/2017   ALBUMIN 3.8 04/14/2017   ALBUMIN  4.3 04/08/2017    Body mass index is 51.09 kg/m.  Orders:  Orders Placed This Encounter  Procedures  . XR Foot 2 Views Left   No orders of the defined types were placed in this encounter.    Procedures: No procedures performed  Clinical Data: No additional findings.  ROS:  All other systems negative, except as noted in the HPI. Review of Systems  Objective: Vital Signs: Ht 5\' 5"  (1.651 m)   Wt (!) 307 lb (139.3 kg)   BMI 51.09 kg/m   Specialty Comments:  No specialty comments available.  PMFS History: Patient Active Problem List   Diagnosis Date Noted  . Vitamin D deficiency 01/14/2018  . Prediabetes 01/14/2018  . Essential hypertension 07/30/2017  . OSA (obstructive sleep apnea) 07/20/2017  . Nasal sore 07/18/2017  . Chronic diastolic CHF (congestive heart failure) (HCC) 03/01/2017  . Pulmonary hypertension (HCC) 03/01/2017  . Morbid obesity (HCC)   . SOB (shortness of breath)   . Migraine without status migrainosus, not intractable   . Altered mental state   . Hypertensive cardiomyopathy, 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HCC)   . Congestive heart failure (HCC)   . Pulmonary edema   . 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 HTN (hypertension), benign 02/21/2017  . Adjustment disorder with mixed anxiety and  depressed mood 02/21/2017  . 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 Hypertensive emergency 02/21/2017  . Tobacco abuse 02/21/2017  . Chest pain 02/21/2017  . Anxiety and depression 02/21/2017   Past Medical History:  Diagnosis Date  . Anemia   . Anxiety   . Cardiomegaly   . Chest pain   . CHF (congestive heart failure) (HCC)   . Constipation   . Depression   . Dyspnea   . Fatigue   . Food allergy    celery  . GERD (gastroesophageal reflux disease)   . Hip pain   . HLD (hyperlipidemia)   . HTN (hypertension)   . Infertility, female   . Lactose intolerance   . Leg edema   . Lower back pain   . OSA on  CPAP   . Palpitations   . Prediabetes   . Shoulder pain   . Stomach ulcer   . Varicose vein of leg     Family History  Problem Relation Age of Onset  . Hypertension Mother   . Cancer Mother        breast cancer  . Kidney disease Mother   . Depression Mother   . Obesity Mother   . Diabetes Father   . Heart disease Father 68  . Hyperlipidemia Father   . Hypertension Father   . Depression Father   . Anxiety disorder Father   . Sleep apnea Father   . Obesity Father   . Thyroid disease Sister   . Sudden death Brother   . Cancer Maternal Aunt        breast cancer  . Cancer Paternal Grandfather        brain cancer    Past Surgical History:  Procedure Laterality Date  . ABDOMINAL HYSTERECTOMY    . ENDOMETRIAL ABLATION  2007   prior to hysterectomy   Social History   Occupational History  . Occupation: [employer]  Tobacco Use  . Smoking status: Former Smoker    Packs/day: 0.25    Years: 10.00    Pack years: 2.50    Types: Cigarettes  . Smokeless tobacco: Never Used  Substance and Sexual Activity  . Alcohol use: Yes    Comment: rarely  . Drug use: No  . Sexual activity: Not on file

## 2018-05-26 ENCOUNTER — Encounter: Payer: Self-pay | Admitting: Neurology

## 2018-05-26 ENCOUNTER — Ambulatory Visit (INDEPENDENT_AMBULATORY_CARE_PROVIDER_SITE_OTHER): Payer: 59 | Admitting: Neurology

## 2018-05-26 VITALS — BP 136/53 | HR 54 | Ht 67.0 in | Wt 310.0 lb

## 2018-05-26 DIAGNOSIS — G4733 Obstructive sleep apnea (adult) (pediatric): Secondary | ICD-10-CM

## 2018-05-26 DIAGNOSIS — Z9989 Dependence on other enabling machines and devices: Secondary | ICD-10-CM

## 2018-05-26 DIAGNOSIS — G43109 Migraine with aura, not intractable, without status migrainosus: Secondary | ICD-10-CM | POA: Diagnosis not present

## 2018-05-26 NOTE — Patient Instructions (Addendum)
Please continue using your CPAP regularly. While your insurance requires that you use CPAP at least 4 hours each night on 70% of the nights, I recommend, that you not skip any nights and use it throughout the night if you can. Getting used to CPAP and staying with the treatment long term does take time and patience and discipline. Untreated obstructive sleep apnea when it is moderate to severe can have an adverse impact on cardiovascular health and raise her risk for heart disease, arrhythmias, hypertension, congestive heart failure, stroke and diabetes. Untreated obstructive sleep apnea causes sleep disruption, nonrestorative sleep, and sleep deprivation. This can have an impact on your day to day functioning and cause daytime sleepiness and impairment of cognitive function, memory loss, mood disturbance, and problems focussing. Using CPAP regularly can improve these symptoms.   Please change your filter every 1-2 months, your mask about every 3 months, hose about 6 months, humidifier chamber about yearly. Some restrictions are imposed by her insurance carrier with regard to how frequently you can get certain supplies.   The address for [facility] is: [address] [phone].   Please remember, common headache triggers are: sleep deprivation, dehydration, overheating, stress, hypoglycemia or skipping meals and blood sugar fluctuations, excessive pain medications or excessive alcohol use or caffeine withdrawal. Some people have food triggers such as aged cheese, orange juice or chocolate, especially dark chocolate, or MSG (monosodium glutamate). Try to avoid these headache triggers as much possible. It may be helpful to keep a headache diary to figure out what makes your headaches worse or brings them on and what alleviates them. Some people report headache onset after exercise but studies have shown that regular exercise may actually prevent headaches from coming. If you have exercise-induced  headaches, please make sure that you drink plenty of fluid before and after exercising and that you do not over do it and do not overheat.  We can see you in one year.

## 2018-05-26 NOTE — Progress Notes (Signed)
Subjective:    Patient ID: Amber Bentley is a 42 y.o. female.  HPI     Interim history:   Amber Bentley is a 42 year old right-handed woman with an underlying medical history of hypertension, prior smoking, chest pain, hypertension cardiomyopathy with history of congestive heart failure, lower extremity edema, pulmonary hypertension and morbid obesity, who presents for follow-up consultation of her OSA, on CPAP therapy. The patient is unaccompanied today. She was not able to keep her appointment on 03/06/2018. I last saw her on 09/05/2017, at which time she was compliant with CPAP therapy, we talked about her sleep study results at the time including her baseline sleep study from June 2018 and her CPAP titration study from June 2018 as well. She reported some recurrent headaches, reported no telltale improvement of her sleep quality her sleep consolidation. She was struggling with her weight. I gave her a prescription for Fioricet for abortive use for headaches as needed. I made a referral to the medical weight loss clinic with Dr. Leafy Ro.   Today, 05/26/2018: I reviewed her CPAP compliance data from 04/22/2018 through 05/21/2018 which is a total of 30 days, during which time she used her CPAP only 8 days with percent used days greater than 4 hours at 13% only, indicating poor compliance. In the past 90 days she has used her CPAP 56 days with percent used days greater than 4 hours at 43%, indicating suboptimal compliance, AHI at goal at 0.4 per hour, leak acceptable with the 95th percentile at 12 L per minute on a pressure of 10 cm with EPR of 3. She reports having a tendency to pull the middle of the middle of the night, she has also forgotten to use the CPAP some night. She has not had any updated supplies through her DME company. Her weight has been fluctuating, currently less than about 6 months ago. She sees Dr. Adair Patter. She has had some recent stressors, she does admit that she does not always get  enough sleep, sometimes only 5 hours. She has noticed in the past few weeks an increase in migraine auras with some nausea, some light sensitivity, not always with a headache. She does have an upcoming yearly appointment for her eyes later this month. She uses contact lenses. She takes Excedrin Migraine as needed.   The patient's allergies, current medications, family history, past medical history, past social history, past surgical history and problem list were reviewed and updated as appropriate.   Previously (copied from previous notes for reference):   I first met her on 04/30/2017 at the request of her primary care provider, at which time she reported witnessed apneas, daytime somnolence, snoring, and oxygen desaturations during sleep. She also had complaints of nocturia and morning headaches.  I suggested we proceed with sleep study testing. She had a baseline sleep study, as well as a CPAP titration study. I went over her test results with her in detail today. Her baseline sleep study from 05/24/2017 showed a sleep latency of 10 minutes, sleep efficiency 94.5%, REM latency 82.5 minutes. She had an increased percentage of slow-wave sleep and REM sleep was near normal at 18.2%. She had a total AHI in the mild range of 9.6 per hour, REM AHI was severe at 49 per hour, supine AHI was 9.9 per hour, average oxygen saturation was 97%, nadir was 69% during REM sleep. Time below 89% saturation was 54 minutes. She had no significant PLMS. Given her medical history and severe desaturations during REM sleep I  suggested she proceed with a full night CPAP titration study. She had this on 06/14/2017. Sleep efficiency was 85.5%, sleep latency 11 minutes, REM latency 53 minutes. She had a normal percentage of REM sleep at 21.6%. She was fitted with nasal pillows and CPAP was titrated from 5 cm to 9 cm. On the final pressure her AHI was 0 per hour, O2 nadir of 86% with nonsupine REM sleep achieved. Based on her test  results I suggested we proceed with CPAP treatment at home at a pressure of 10 cm.   I reviewed her CPAP compliance data from 08/05/2017 through 09/03/2017 which is a total of 30 days, during which time she used her CPAP every night with percent used days greater than 4 hours at 87%, indicating very good compliance with an average usage of 6 hours and 1 minute, residual AHI low at 0.2 per hour, leak low with the 95th percentile at 5.4 L/m on a pressure of 10 cm with EPR of 3.   04/30/2017: (She) reports snoring and excessive daytime somnolence, as well as witnessed apneas, O2 desaturations in sleep, a FHx of OSA in her father, nocturia and AH HAs. I reviewed your office note from 04/01/2017, which you kindly included. She has been followed by cardiology for her CHF.  Of note, she has been to the emergency room recently twice. On 04/14/2017 she presented with several episodes of vomiting. On 04/27/2017 she presented with chest pain. Her Epworth sleepiness score is 19 out of 24, her fatigue score is 47 out of 63. She reports that she has been noted to have breathing pauses while asleep, as witnessed during her hospitalization recently. She was placed on oxygen while in the hospital and still uses oxygen at night.  Her BT is around 10-11 PM and WT is 6:45 for her work and to wake up kids, ages 69 and 65.  Her father had OSA and a CPAP machine, he passed away at age 40. She gained weight in the past 5 years and started losing weight again. She works as a Aeronautical engineer. She quit smoking in 3/18, drinks alcohol very infrequently, and about 20 oz of coffee per day, maybe less.    Her Past Medical History Is Significant For: Past Medical History:  Diagnosis Date  . Anemia   . Anxiety   . Cardiomegaly   . Chest pain   . CHF (congestive heart failure) ([place])   . Constipation   . Depression   . Dyspnea   . Fatigue   . Food allergy    celery  . GERD (gastroesophageal reflux disease)   . Hip pain   .  HLD (hyperlipidemia)   . HTN (hypertension)   . Infertility, female   . Lactose intolerance   . Leg edema   . Lower back pain   . OSA on CPAP   . Palpitations   . Prediabetes   . Shoulder pain   . Stomach ulcer   . Varicose vein of leg     Her Past Surgical History Is Significant For: Past Surgical History:  Procedure Laterality Date  . ABDOMINAL HYSTERECTOMY    . ENDOMETRIAL ABLATION  2007   prior to hysterectomy    Her Family History Is Significant For: Family History  Problem Relation Age of Onset  . Hypertension Mother   . Cancer Mother        breast cancer  . Kidney disease Mother   . Depression Mother   . Obesity Mother   .  Diabetes Father   . Heart disease Father 73  . Hyperlipidemia Father   . Hypertension Father   . Depression Father   . Anxiety disorder Father   . Sleep apnea Father   . Obesity Father   . Thyroid disease Sister   . Sudden death Brother   . Cancer Maternal Aunt        breast cancer  . Cancer Paternal Grandfather        brain cancer    Her Social History Is Significant For: Social History   Socioeconomic History  . Marital status: Married    Spouse name: Anderson Malta  . Number of children: 2  . Years of education: MA  . Highest education level: Not on file  Occupational History  . Occupation: [employer]  . Financial resource strain: Not on file  . Food insecurity:    Worry: Not on file    Inability: Not on file  . Transportation needs:    Medical: Not on file    Non-medical: Not on file  Tobacco Use  . Smoking status: Former Smoker    Packs/day: 0.25    Years: 10.00    Pack years: 2.50    Types: Cigarettes  . Smokeless tobacco: Never Used  Substance and Sexual Activity  . Alcohol use: Yes    Comment: rarely  . Drug use: No  . Sexual activity: Not on file  Lifestyle  . Physical activity:    Days per week: Not on file    Minutes per session: Not on file  . Stress: Not on file   Relationships  . Social connections:    Talks on phone: Not on file    Gets together: Not on file    Attends religious service: Not on file    Active member of club or organization: Not on file    Attends meetings of clubs or organizations: Not on file    Relationship status: Not on file  Other Topics Concern  . Not on file  Social History Narrative   Drinks coffee daily     Her Allergies Are:  Allergies  Allergen Reactions  . 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   Tori Milks [Naproxen] Other (See Comments)    Pt states that this medication makes her loopy.   :   Her Current Medications Are:  Outpatient Encounter Medications as of 05/26/2018  Medication Sig  . carvedilol (COREG) 6.25 MG tablet Take 1 tablet (6.25 mg total) by mouth 2 (two) times daily.  [name] lisinopril (PRINIVIL,ZESTRIL) 20 MG tablet TAKE 1 TABLET BY MOUTH ONCE DAILY  . metFORMIN (GLUCOPHAGE) 500 MG tablet TAKE 1 TABLET BY MOUTH ONCE DAILY WITH BREAKFAST  . pantoprazole (PROTONIX) 40 MG tablet TAKE 1 TABLET BY MOUTH TWICE DAILY  . spironolactone (ALDACTONE) 25 MG tablet Take 25 mg by mouth daily.  . Vitamin D, Ergocalciferol, (DRISDOL) 50000 units CAPS capsule Take 1 capsule (50,000 Units total) by mouth every 7 (seven) days.  . rosuvastatin (CRESTOR) 10 MG tablet Take 1 tablet (10 mg total) by mouth daily.   No facility-administered encounter medications on file as of 05/26/2018.   :  Review of Systems:  Out of a complete 14 point review of systems, all are reviewed and negative with the exception of these symptoms as listed below: Review of Systems  Neurological:       Pt presents today to discuss her cpap. Pt  has been unknowingly taking her cpap off during the night. Pt has not gotten supplies from [employer].    Objective:  Neurological Exam  Physical Exam Physical Examination:   Vitals:   05/26/18 1323  BP: (!) 136/53  Pulse: (!) 54    General Examination: The  patient is a very pleasant 42 y.o. female in no acute distress. She appears well-developed and well-nourished and well groomed.   HEENT:Normocephalic, atraumatic, pupils are equal, round and reactive to light and accommodation. Extraocular tracking is good without limitation to gaze excursion or nystagmus noted. Normal smooth pursuit is noted. Hearing is grossly intact. Face is symmetric with normal facial animation and normal facial sensation. Speech is clear with no dysarthria noted. There is no hypophonia. There is no lip, neck/head, jaw or voice tremor. Oropharynx exam reveals: mildmouth dryness, adequatedental hygiene and mildairway crowding. Tongue protrudes centrally and palate elevates symmetrically. Tonsils are 1+ in size.   Chest:Clear to auscultation without wheezing, rhonchi or crackles noted.  Heart:S1+S2+0, regular and normal without murmurs, rubs or gallops noted.   Abdomen:Soft, non-tender and non-distended with normal bowel sounds appreciated on auscultation.  Extremities:There is trace edema in the distal lower extremities bilaterally. Pedal pulses are intact.  Skin: Warm and dry without trophic changes noted.  Musculoskeletal: exam reveals no obvious joint deformities, tenderness or joint swelling or erythema.   Neurologically:  Mental status: The patient is awake, alert and oriented in all 4 spheres. Herimmediate and remote memory, attention, language skills and fund of knowledge are appropriate. There is no evidence of aphasia, agnosia, apraxia or anomia. Speech is clear with normal prosody and enunciation. Thought process is linear. Mood is normaland affect is normal.  Cranial nerves II - XII are as described above under HEENT exam. In addition: shoulder shrug is normal with equal shoulder height noted. Motor exam: Normal bulk, strength and tone is noted. There is no drift, tremor or rebound. Fine motor skills and coordination: intact grossly. Cerebellar  testing: No dysmetria or intention tremor. There is no truncal or gait ataxia.  Sensory exam: intact to light touch in the upper and lower extremities.  Gait, station and balance: Shestands easily. No veering to one side is noted. No leaning to one side is noted. Posture is age-appropriate and stance is narrow based. Gait shows normalstride length and normalpace. No problems turning are noted.   Assessment and Plan:  In summary, Amber Bentley a very pleasant 42 year old female with an underlying medical history of hypertension, recent smoking cessation in March 2018, chest pain, hypertensive cardiomyopathy with heart failure, LE edema, pulm hypertension and morbid obesity, who presents for follow-up consultation of her obstructive sleep apnea, on CPAP therapy. She had a baseline sleep study in June 2018 and a CPAP titration study in June 2018. She has had a decline in her CPAP compliance and is encouraged to get back on track with treatment especially as she may notice an improvement in her migraine auras/ocular migraines. She is advised about headache triggers, including dehydration, sleep deprivation and stress. She can continue with Excedrin Migraine as needed. Physical exam is stable, she is trying to lose weight and is commended for weight loss endeavors and success thus far. She can follow-up routinely in one year, she can see one of our NPs. I renewed a CPAP supply order and we will fax this to her current DME company. I answered all her questions today and she was in agreement.

## 2018-05-27 ENCOUNTER — Ambulatory Visit (INDEPENDENT_AMBULATORY_CARE_PROVIDER_SITE_OTHER): Payer: 59 | Admitting: Family Medicine

## 2018-05-27 VITALS — BP 121/80 | HR 58 | Temp 97.5°F | Ht 67.0 in | Wt 304.0 lb

## 2018-05-27 DIAGNOSIS — R7303 Prediabetes: Secondary | ICD-10-CM | POA: Diagnosis not present

## 2018-05-27 DIAGNOSIS — Z9189 Other specified personal risk factors, not elsewhere classified: Secondary | ICD-10-CM

## 2018-05-27 DIAGNOSIS — Z6841 Body Mass Index (BMI) 40.0 and over, adult: Secondary | ICD-10-CM

## 2018-05-27 DIAGNOSIS — E559 Vitamin D deficiency, unspecified: Secondary | ICD-10-CM

## 2018-05-27 MED ORDER — METFORMIN HCL 500 MG PO TABS: ORAL_TABLET | ORAL | 0 refills | Status: DC

## 2018-05-27 MED ORDER — VITAMIN D (ERGOCALCIFEROL) 1.25 MG (50000 UNIT) PO CAPS: 50000.0000 [IU] | ORAL_CAPSULE | ORAL | 0 refills | Status: DC

## 2018-05-28 MED FILL — metFORMIN HCL 500 MG TABS: 500 | 30 days supply | Qty: 30 | Fill #0

## 2018-05-28 MED FILL — VIT D2 1.25 MG (50,000 UNIT: 1.25 MG | 28 days supply | Qty: 4 | Fill #0

## 2018-05-28 NOTE — Progress Notes (Signed)
Office: [phone]  /  Fax: [phone]   HPI:   Chief Complaint: OBESITY Amber Bentley is here to discuss her progress with her obesity treatment plan. She is on the keep a food journal with 1600 calories and 90+ grams of protein daily and is following her eating plan approximately 75 % of the time. She states she is exercising 0 minutes 0 times per week. Amber Bentley struggled the past two weeks with some self perception, feels she is overwhelmed with lots of demands to eat out and numerous outings.  Her weight is (!) 304 lb (137.9 kg) today and has had a weight loss of 3 pounds over a period of 4 weeks since her last visit. She has lost 47 lbs since starting treatment with [name].  Vitamin D Deficiency Amber Bentley has a diagnosis of vitamin D deficiency. She is currently taking prescription Vit D. She notes improving fatigue and denies nausea, vomiting or muscle weakness.  Pre-Diabetes Amber Bentley has a diagnosis of pre-diabetes based on her elevated Hgb A1c and was informed this puts her at greater risk of developing diabetes. She notes carbohydrate cravings and eating out indulgences. She is taking metformin currently and continues to work on diet and exercise to decrease risk of diabetes. She denies nausea or hypoglycemia.  At risk for diabetes Amber Bentley is at higher than average risk for developing diabetes due to her obesity and pre-diabetes. She currently denies polyuria or polydipsia.  ALLERGIES: Allergies  Allergen Reactions  . 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   [name] [Naproxen] Other (See Comments)    Pt states that this medication makes her loopy.     MEDICATIONS: Current Outpatient Medications on File Prior to Visit  Medication Sig Dispense Refill  . carvedilol (COREG) 6.25 MG tablet Take 1 tablet (6.25 mg total) by mouth 2 (two) times daily. 180 tablet 3  . lisinopril (PRINIVIL,ZESTRIL) 20 MG tablet TAKE 1 TABLET BY MOUTH ONCE DAILY 90 tablet 1  .  pantoprazole (PROTONIX) 40 MG tablet TAKE 1 TABLET BY MOUTH TWICE DAILY 180 tablet 1  . spironolactone (ALDACTONE) 25 MG tablet Take 25 mg by mouth daily.    . rosuvastatin (CRESTOR) 10 MG tablet Take 1 tablet (10 mg total) by mouth daily. 90 tablet 3   No current facility-administered medications on file prior to visit.     PAST MEDICAL HISTORY: Past Medical History:  Diagnosis Date  . Anemia   . Anxiety   . Cardiomegaly   . Chest pain   . CHF (congestive heart failure) ([facility])   . Constipation   . Depression   . Dyspnea   . Fatigue   . Food allergy    celery  . GERD (gastroesophageal reflux disease)   . Hip pain   . HLD (hyperlipidemia)   . HTN (hypertension)   . Infertility, female   . Lactose intolerance   . Leg edema   . Lower back pain   . OSA on CPAP   . Palpitations   . Prediabetes   . Shoulder pain   . Stomach ulcer   . Varicose vein of leg     PAST SURGICAL HISTORY: Past Surgical History:  Procedure Laterality Date  . ABDOMINAL HYSTERECTOMY    . ENDOMETRIAL ABLATION  2007   prior to hysterectomy    SOCIAL HISTORY: Social History   Tobacco Use  . Smoking status: Former Smoker    Packs/day: 0.25    Years: 10.00  Pack years: 2.50    Types: Cigarettes  . Smokeless tobacco: Never Used  Substance Use Topics  . Alcohol use: Yes    Comment: rarely  . Drug use: No    FAMILY HISTORY: Family History  Problem Relation Age of Onset  . Hypertension Mother   . Cancer Mother        breast cancer  . Kidney disease Mother   . Depression Mother   . Obesity Mother   . Diabetes Father   . Heart disease Father 53  . Hyperlipidemia Father   . Hypertension Father   . Depression Father   . Anxiety disorder Father   . Sleep apnea Father   . Obesity Father   . Thyroid disease Sister   . Sudden death Brother   . Cancer Maternal Aunt        breast cancer  . Cancer Paternal Grandfather        brain cancer    ROS: Review of Systems  Constitutional:  Positive for malaise/fatigue and weight loss.  Gastrointestinal: Negative for nausea and vomiting.  Genitourinary: Negative for frequency.  Musculoskeletal:       Negative muscle weakness  Endo/Heme/Allergies: Negative for polydipsia.       Negative hypoglycemia    PHYSICAL EXAM: Blood pressure 121/80, pulse (!) 58, temperature (!) 97.5 F (36.4 C), temperature source Oral, height 5\' 7"  (1.702 m), weight (!) 304 lb (137.9 kg), SpO2 98 %. Body mass index is 47.61 kg/m. Physical Exam  Constitutional: She is oriented to person, place, and time. She appears well-developed and well-nourished.  Cardiovascular: Normal rate.  Pulmonary/Chest: Effort normal.  Musculoskeletal: Normal range of motion.  Neurological: She is oriented to person, place, and time.  Skin: Skin is warm and dry.  Psychiatric: She has a normal mood and affect. Her behavior is normal.  Vitals reviewed.   RECENT LABS AND TESTS: BMET    Component Value Date/Time   NA 141 12/30/2017 1147   K 4.9 12/30/2017 1147   CL 102 12/30/2017 1147   CO2 23 12/30/2017 1147   GLUCOSE 108 (H) 12/30/2017 1147   GLUCOSE 113 (H) 06/20/2017 0856   BUN 17 12/30/2017 1147   CREATININE 0.87 12/30/2017 1147   CALCIUM 9.3 12/30/2017 1147   GFRNONAA 84 12/30/2017 1147   GFRAA 97 12/30/2017 1147   Lab Results  Component Value Date   HGBA1C 6.0 (H) 12/30/2017   HGBA1C 5.8 02/21/2017   Lab Results  Component Value Date   INSULIN 89.7 (H) 12/30/2017   CBC    Component Value Date/Time   WBC 9.3 12/30/2017 1147   WBC 9.6 06/20/2017 0856   RBC 5.18 12/30/2017 1147   RBC 5.04 06/20/2017 0856   HGB 13.4 12/30/2017 1147   HCT 43.1 12/30/2017 1147   PLT 224.0 06/20/2017 0856   MCV 83 12/30/2017 1147   MCH 25.9 (L) 12/30/2017 1147   MCH 24.1 (L) 04/27/2017 2055   MCHC 31.1 (L) 12/30/2017 1147   MCHC 32.7 06/20/2017 0856   RDW 13.6 12/30/2017 1147   LYMPHSABS 2.0 12/30/2017 1147   MONOABS 0.6 02/21/2017 1510   EOSABS 0.2  12/30/2017 1147   BASOSABS 0.0 12/30/2017 1147   Iron/TIBC/Ferritin/ %Sat    Component Value Date/Time   IRON 85 06/20/2017 0856   TIBC 344 06/20/2017 0856   FERRITIN 55.4 06/20/2017 0856   IRONPCTSAT 25 06/20/2017 0856   Lipid Panel     Component Value Date/Time   CHOL 120 12/30/2017 1147  TRIG 65 12/30/2017 1147   HDL 45 12/30/2017 1147   CHOLHDL 3.3 04/08/2017 1124   CHOLHDL 4.7 02/22/2017 0420   VLDL 11 02/22/2017 0420   LDLCALC 62 12/30/2017 1147   Hepatic Function Panel     Component Value Date/Time   PROT 7.3 12/30/2017 1147   ALBUMIN 4.5 12/30/2017 1147   AST 24 12/30/2017 1147   ALT 28 12/30/2017 1147   ALKPHOS 70 12/30/2017 1147   BILITOT 0.3 12/30/2017 1147   BILIDIR 0.4 04/14/2017 0315   IBILI 0.7 04/14/2017 0315      Component Value Date/Time   TSH 1.270 12/30/2017 1147   TSH 3.01 06/20/2017 0856   TSH 4.78 (H) 02/21/2017 1510  Results for TELLA, HOPFENSPERGER (MRN [id number]) as of 05/28/2018 08:07  Ref. Range 12/30/2017 11:47  Vitamin D, 25-Hydroxy Latest Ref Range: 30.0 - 100.0 ng/mL 11.0 (L)    ASSESSMENT AND PLAN: Vitamin D deficiency - Plan: Vitamin D, Ergocalciferol, (DRISDOL) 50000 units CAPS capsule  Prediabetes - Plan: metFORMIN (GLUCOPHAGE) 500 MG tablet  At risk for diabetes mellitus  Class 3 severe obesity with serious comorbidity and body mass index (BMI) of 50.0 to 59.9 in adult, unspecified obesity type (HCC)  PLAN:  Vitamin D Deficiency Amber Bentley was informed that low vitamin D levels contributes to fatigue and are associated with obesity, breast, and colon cancer. Amber Bentley agrees to continue taking prescription Vit D @50 ,000 IU every week #4 and we will refill for 1 month. She will follow up for routine testing of vitamin D, at least 2-3 times per year. She was informed of the risk of over-replacement of vitamin D and agrees to not increase her dose unless she discusses this with [name] first. Daveah agrees to follow up with our clinic in 2  weeks.  Pre-Diabetes Amber Bentley will continue to work on weight loss, exercise, and decreasing simple carbohydrates in her diet to help decrease the risk of diabetes. We dicussed metformin including benefits and risks. She was informed that eating too many simple carbohydrates or too many calories at one sitting increases the likelihood of GI side effects. Amber Bentley agrees to continue taking metformin 500 mg PO q AM #30 and we will refill for 1 month. Amber Bentley agrees to follow up with our clinic in 2 weeks as directed to monitor her progress.  Diabetes risk counselling Amber Bentley was given extended (15 minutes) diabetes prevention counseling today. She is 42 y.o. female and has risk factors for diabetes including obesity and pre-diabetes. We discussed intensive lifestyle modifications today with an emphasis on weight loss as well as increasing exercise and decreasing simple carbohydrates in her diet.  Obesity Amber Bentley is currently in the action stage of change. As such, her goal is to continue with weight loss efforts She has agreed to keep a food journal with 1600 calories and 90+ grams of protein daily Kamlyn has been instructed to work up to a goal of 150 minutes of combined cardio and strengthening exercise per week for weight loss and overall health benefits. We discussed the following Behavioral Modification Strategies today: increasing lean protein intake, increasing vegetables, work on meal planning and easy cooking plans, better snacking choices, and planning for success   Amber Bentley has agreed to follow up with our clinic in 2 weeks. She was informed of the importance of frequent follow up visits to maximize her success with intensive lifestyle modifications for her multiple health conditions.   OBESITY BEHAVIORAL INTERVENTION VISIT  Today's visit was # 10 out of 22.  Starting weight: 351 lbs Starting date: 12/30/17 Today's weight : 304 lbs  Today's date: 05/27/2018 Total lbs lost to date: 84 (Patients  must lose 7 lbs in the first 6 months to continue with counseling)   ASK: We discussed the diagnosis of obesity with Beverly Sessions today and Tilly agreed to give [name] permission to discuss obesity behavioral modification therapy today.  ASSESS: Merry has the diagnosis of obesity and her BMI today is 26.6 Terrel is in the action stage of change   ADVISE: Takyla was educated on the multiple health risks of obesity as well as the benefit of weight loss to improve her health. She was advised of the need for long term treatment and the importance of lifestyle modifications.  AGREE: Multiple dietary modification options and treatment options were discussed and  Jacinda agreed to the above obesity treatment plan.  I, Burt Knack, am acting as transcriptionist for Debbra Riding, MD  I have reviewed the above documentation for accuracy and completeness, and I agree with the above. - Debbra Riding, MD

## 2018-05-29 DIAGNOSIS — I5041 Acute combined systolic (congestive) and diastolic (congestive) heart failure: Secondary | ICD-10-CM | POA: Diagnosis not present

## 2018-05-29 DIAGNOSIS — I159 Secondary hypertension, unspecified: Secondary | ICD-10-CM | POA: Diagnosis not present

## 2018-05-29 DIAGNOSIS — I5032 Chronic diastolic (congestive) heart failure: Secondary | ICD-10-CM | POA: Diagnosis not present

## 2018-06-09 DIAGNOSIS — H5213 Myopia, bilateral: Secondary | ICD-10-CM | POA: Diagnosis not present

## 2018-06-16 ENCOUNTER — Ambulatory Visit (INDEPENDENT_AMBULATORY_CARE_PROVIDER_SITE_OTHER): Payer: Self-pay

## 2018-06-16 ENCOUNTER — Encounter (INDEPENDENT_AMBULATORY_CARE_PROVIDER_SITE_OTHER): Payer: Self-pay | Admitting: Orthopedic Surgery

## 2018-06-16 ENCOUNTER — Ambulatory Visit (INDEPENDENT_AMBULATORY_CARE_PROVIDER_SITE_OTHER): Payer: 59 | Admitting: Family Medicine

## 2018-06-16 ENCOUNTER — Ambulatory Visit (INDEPENDENT_AMBULATORY_CARE_PROVIDER_SITE_OTHER): Payer: 59 | Admitting: Orthopedic Surgery

## 2018-06-16 VITALS — Ht 67.0 in | Wt 304.0 lb

## 2018-06-16 VITALS — BP 126/80 | HR 56 | Temp 98.0°F | Ht 67.0 in | Wt 304.0 lb

## 2018-06-16 DIAGNOSIS — S92352A Displaced fracture of fifth metatarsal bone, left foot, initial encounter for closed fracture: Secondary | ICD-10-CM

## 2018-06-16 DIAGNOSIS — Z6841 Body Mass Index (BMI) 40.0 and over, adult: Secondary | ICD-10-CM

## 2018-06-16 DIAGNOSIS — Z9189 Other specified personal risk factors, not elsewhere classified: Secondary | ICD-10-CM

## 2018-06-16 DIAGNOSIS — E559 Vitamin D deficiency, unspecified: Secondary | ICD-10-CM | POA: Diagnosis not present

## 2018-06-16 DIAGNOSIS — R7303 Prediabetes: Secondary | ICD-10-CM

## 2018-06-16 DIAGNOSIS — S92356A Nondisplaced fracture of fifth metatarsal bone, unspecified foot, initial encounter for closed fracture: Secondary | ICD-10-CM

## 2018-06-16 NOTE — Progress Notes (Signed)
Office Visit Note   Patient: Amber Bentley           Date of Birth: [birth year]           MRN: [id number] Visit Date: 06/16/2018              Requested by: Anne Ng, NP [address] [place], [place] [address] PCP: Anne Ng, NP  Chief Complaint  Patient presents with  . Left Foot - Follow-up    5th MT fx      HPI: Patient is a 42 year old woman who is currently in regular shoewear.  Patient states she still has some soreness and fatigues easily with prolonged walking.  Assessment & Plan: Visit Diagnoses:  1. Displaced fracture of fifth metatarsal bone, left foot, initial encounter for closed fracture     Plan: Patient will continue with her stiff soled Trail running sneakers increase her activities as tolerated.  Follow-Up Instructions: Return if symptoms worsen or fail to improve.   Ortho Exam  Patient is alert, oriented, no adenopathy, well-dressed, normal affect, normal respiratory effort. Examination patient does have swelling in her foot there is no redness no cellulitis.  Patient has minimal tenderness to palpation over the fifth metatarsal.  The ankle is dorsiflexion past neutral.  She has a normal gait.  Imaging: Xr Foot 2 Views Left  Result Date: 06/16/2018 2 view radiographs of the left foot shows good callus formation at the fracture site.  No images are attached to the encounter.  Labs: Lab Results  Component Value Date   HGBA1C 6.0 (H) 12/30/2017   HGBA1C 5.8 02/21/2017     Lab Results  Component Value Date   ALBUMIN 4.5 12/30/2017   ALBUMIN 3.8 04/14/2017   ALBUMIN 4.3 04/08/2017    Body mass index is 47.61 kg/m.  Orders:  Orders Placed This Encounter  Procedures  . XR Foot 2 Views Left   No orders of the defined types were placed in this encounter.    Procedures: No procedures performed  Clinical Data: No additional findings.  ROS:  All other systems negative, except as noted in the HPI. Review of  Systems  Objective: Vital Signs: Ht 5\' 7"  (1.702 m)   Wt (!) 304 lb (137.9 kg)   BMI 47.61 kg/m   Specialty Comments:  No specialty comments available.  PMFS History: Patient Active Problem List   Diagnosis Date Noted  . Vitamin D deficiency 01/14/2018  . Prediabetes 01/14/2018  . Essential hypertension 07/30/2017  . OSA (obstructive sleep apnea) 07/20/2017  . Nasal sore 07/18/2017  . Chronic diastolic CHF (congestive heart failure) (HCC) 03/01/2017  . Pulmonary hypertension (HCC) 03/01/2017  . Morbid obesity (HCC)   . SOB (shortness of breath)   . Migraine without status migrainosus, not intractable   . Altered mental state   . Hypertensive cardiomyopathy, 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HCC)   . Congestive heart failure (HCC)   . Pulmonary edema   . 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 HTN (hypertension), benign 02/21/2017  . Adjustment disorder with mixed anxiety and depressed mood 02/21/2017  . 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 Hypertensive emergency 02/21/2017  . Tobacco abuse 02/21/2017  . Chest pain 02/21/2017  . Anxiety and depression 02/21/2017   Past Medical History:  Diagnosis Date  . Anemia   . Anxiety   . Cardiomegaly   . Chest pain   . CHF (congestive heart failure) (HCC)   . Constipation   .  Depression   . Dyspnea   . Fatigue   . Food allergy    celery  . GERD (gastroesophageal reflux disease)   . Hip pain   . HLD (hyperlipidemia)   . HTN (hypertension)   . Infertility, female   . Lactose intolerance   . Leg edema   . Lower back pain   . OSA on CPAP   . Palpitations   . Prediabetes   . Shoulder pain   . Stomach ulcer   . Varicose vein of leg     Family History  Problem Relation Age of Onset  . Hypertension Mother   . Cancer Mother        breast cancer  . Kidney disease Mother   . Depression Mother   . Obesity Mother   . Diabetes Father   . Heart disease Father 71  . Hyperlipidemia  Father   . Hypertension Father   . Depression Father   . Anxiety disorder Father   . Sleep apnea Father   . Obesity Father   . Thyroid disease Sister   . Sudden death Brother   . Cancer Maternal Aunt        breast cancer  . Cancer Paternal Grandfather        brain cancer    Past Surgical History:  Procedure Laterality Date  . ABDOMINAL HYSTERECTOMY    . ENDOMETRIAL ABLATION  2007   prior to hysterectomy   Social History   Occupational History  . Occupation: [employer]  Tobacco Use  . Smoking status: Former Smoker    Packs/day: 0.25    Years: 10.00    Pack years: 2.50    Types: Cigarettes  . Smokeless tobacco: Never Used  Substance and Sexual Activity  . Alcohol use: Yes    Comment: rarely  . Drug use: No  . Sexual activity: Not on file

## 2018-06-17 MED ORDER — VITAMIN D (ERGOCALCIFEROL) 1.25 MG (50000 UNIT) PO CAPS: 50000.0000 [IU] | ORAL_CAPSULE | ORAL | 0 refills | Status: DC

## 2018-06-17 NOTE — Progress Notes (Signed)
Office: [phone]  /  Fax: [phone]   HPI:   Chief Complaint: OBESITY Amber Bentley is here to discuss her progress with her obesity treatment plan. She is on the keep a food journal with 1600 calories and 90+ grams of protein daily and is following her eating plan approximately 100 % of the time. She states she is walking 30 minutes 5 times per week. Amber Bentley has been keeping track of food and protein in her head and has been able to be successful with maintenance. She feels relaxing with journaling has been helpful with resetting her mood. Her weight is (!) 304 lb (137.9 kg) today and has maintained weight over a period of 3 weeks since her last visit. She has lost 47 lbs since starting treatment with [name].  Vitamin D deficiency Amber Bentley has a diagnosis of vitamin D deficiency. She is currently taking vit D. Amber Bentley admits fatigue and denies nausea, vomiting or muscle weakness.  At risk for osteopenia and osteoporosis Amber Bentley is at higher risk of osteopenia and osteoporosis due to vitamin D deficiency.   Fracture of fifth Metatarsal Amber Bentley has good callus formation on xray today. She is out of the walking boot.  ALLERGIES: Allergies  Allergen Reactions  . 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   Amber Bentley [Naproxen] Other (See Comments)    Pt states that this medication makes her loopy.     MEDICATIONS: Current Outpatient Medications on File Prior to Visit  Medication Sig Dispense Refill  . carvedilol (COREG) 6.25 MG tablet Take 1 tablet (6.25 mg total) by mouth 2 (two) times daily. 180 tablet 3  . lisinopril (PRINIVIL,ZESTRIL) 20 MG tablet TAKE 1 TABLET BY MOUTH ONCE DAILY 90 tablet 1  . metFORMIN (GLUCOPHAGE) 500 MG tablet TAKE 1 TABLET BY MOUTH ONCE DAILY WITH BREAKFAST 30 tablet 0  . pantoprazole (PROTONIX) 40 MG tablet TAKE 1 TABLET BY MOUTH TWICE DAILY 180 tablet 1  . spironolactone (ALDACTONE) 25 MG tablet Take 25 mg by mouth daily.    . Vitamin D,  Ergocalciferol, (DRISDOL) 50000 units CAPS capsule Take 1 capsule (50,000 Units total) by mouth every 7 (seven) days. 4 capsule 0  . rosuvastatin (CRESTOR) 10 MG tablet Take 1 tablet (10 mg total) by mouth daily. 90 tablet 3   No current facility-administered medications on file prior to visit.     PAST MEDICAL HISTORY: Past Medical History:  Diagnosis Date  . Anemia   . Anxiety   . Cardiomegaly   . Chest pain   . CHF (congestive heart failure) (HCC)   . Constipation   . Depression   . Dyspnea   . Fatigue   . Food allergy    celery  . GERD (gastroesophageal reflux disease)   . Hip pain   . HLD (hyperlipidemia)   . HTN (hypertension)   . Infertility, female   . Lactose intolerance   . Leg edema   . Lower back pain   . OSA on CPAP   . Palpitations   . Prediabetes   . Shoulder pain   . Stomach ulcer   . Varicose vein of leg     PAST SURGICAL HISTORY: Past Surgical History:  Procedure Laterality Date  . ABDOMINAL HYSTERECTOMY    . ENDOMETRIAL ABLATION  2007   prior to hysterectomy    SOCIAL HISTORY: Social History   Tobacco Use  . Smoking status: Former Smoker    Packs/day: 0.25  Years: 10.00    Pack years: 2.50    Types: Cigarettes  . Smokeless tobacco: Never Used  Substance Use Topics  . Alcohol use: Yes    Comment: rarely  . Drug use: No    FAMILY HISTORY: Family History  Problem Relation Age of Onset  . Hypertension Mother   . Cancer Mother        breast cancer  . Kidney disease Mother   . Depression Mother   . Obesity Mother   . Diabetes Father   . Heart disease Father 57  . Hyperlipidemia Father   . Hypertension Father   . Depression Father   . Anxiety disorder Father   . Sleep apnea Father   . Obesity Father   . Thyroid disease Sister   . Sudden death Brother   . Cancer Maternal Aunt        breast cancer  . Cancer Paternal Grandfather        brain cancer    ROS: Review of Systems  Constitutional: Positive for malaise/fatigue.  Negative for weight loss.  Gastrointestinal: Negative for nausea and vomiting.  Musculoskeletal:       Negative for muscle weakness    PHYSICAL EXAM: Blood pressure 126/80, pulse (!) 56, temperature 98 F (36.7 C), temperature source Oral, height 5\' 7"  (1.702 m), weight (!) 304 lb (137.9 kg), SpO2 99 %. Body mass index is 47.61 kg/m. Physical Exam  Constitutional: She is oriented to person, place, and time. She appears well-developed and well-nourished.  Cardiovascular: Normal rate.  Pulmonary/Chest: Effort normal.  Musculoskeletal: Normal range of motion.  Neurological: She is oriented to person, place, and time.  Skin: Skin is warm and dry.  Psychiatric: She has a normal mood and affect. Her behavior is normal.  Vitals reviewed.   RECENT LABS AND TESTS: BMET    Component Value Date/Time   NA 141 12/30/2017 1147   K 4.9 12/30/2017 1147   CL 102 12/30/2017 1147   CO2 23 12/30/2017 1147   GLUCOSE 108 (H) 12/30/2017 1147   GLUCOSE 113 (H) 06/20/2017 0856   BUN 17 12/30/2017 1147   CREATININE 0.87 12/30/2017 1147   CALCIUM 9.3 12/30/2017 1147   GFRNONAA 84 12/30/2017 1147   GFRAA 97 12/30/2017 1147   Lab Results  Component Value Date   HGBA1C 6.0 (H) 12/30/2017   HGBA1C 5.8 02/21/2017   Lab Results  Component Value Date   INSULIN 89.7 (H) 12/30/2017   CBC    Component Value Date/Time   WBC 9.3 12/30/2017 1147   WBC 9.6 06/20/2017 0856   RBC 5.18 12/30/2017 1147   RBC 5.04 06/20/2017 0856   HGB 13.4 12/30/2017 1147   HCT 43.1 12/30/2017 1147   PLT 224.0 06/20/2017 0856   MCV 83 12/30/2017 1147   MCH 25.9 (L) 12/30/2017 1147   MCH 24.1 (L) 04/27/2017 2055   MCHC 31.1 (L) 12/30/2017 1147   MCHC 32.7 06/20/2017 0856   RDW 13.6 12/30/2017 1147   LYMPHSABS 2.0 12/30/2017 1147   MONOABS 0.6 02/21/2017 1510   EOSABS 0.2 12/30/2017 1147   BASOSABS 0.0 12/30/2017 1147   Iron/TIBC/Ferritin/ %Sat    Component Value Date/Time   IRON 85 06/20/2017 0856   TIBC  344 06/20/2017 0856   FERRITIN 55.4 06/20/2017 0856   IRONPCTSAT 25 06/20/2017 0856   Lipid Panel     Component Value Date/Time   CHOL 120 12/30/2017 1147   TRIG 65 12/30/2017 1147   HDL 45 12/30/2017 1147  CHOLHDL 3.3 04/08/2017 1124   CHOLHDL 4.7 02/22/2017 0420   VLDL 11 02/22/2017 0420   LDLCALC 62 12/30/2017 1147   Hepatic Function Panel     Component Value Date/Time   PROT 7.3 12/30/2017 1147   ALBUMIN 4.5 12/30/2017 1147   AST 24 12/30/2017 1147   ALT 28 12/30/2017 1147   ALKPHOS 70 12/30/2017 1147   BILITOT 0.3 12/30/2017 1147   BILIDIR 0.4 04/14/2017 0315   IBILI 0.7 04/14/2017 0315      Component Value Date/Time   TSH 1.270 12/30/2017 1147   TSH 3.01 06/20/2017 0856   TSH 4.78 (H) 02/21/2017 1510   Results for KIMMBERLY, WISSER (MRN [id number]) as of 06/17/2018 08:09  Ref. Range 12/30/2017 11:47  Vitamin D, 25-Hydroxy Latest Ref Range: 30.0 - 100.0 ng/mL 11.0 (L)   ASSESSMENT AND PLAN: Closed nondisplaced fracture of fifth metatarsal bone, unspecified laterality, initial encounter  Vitamin D deficiency - Plan: VITAMIN D 25 Hydroxy (Vit-D Deficiency, Fractures), Vitamin D, Ergocalciferol, (DRISDOL) 50000 units CAPS capsule  Prediabetes - Plan: Hemoglobin A1c, Insulin, random  At risk for osteoporosis  Class 3 severe obesity with serious comorbidity and body mass index (BMI) of 45.0 to 49.9 in adult, unspecified obesity type (HCC)  PLAN:  Vitamin D Deficiency Amber Bentley was informed that low vitamin D levels contributes to fatigue and are associated with obesity, breast, and colon cancer. She agrees to continue to take prescription Vit D @50 ,000 IU every week #4 with no refills and will follow up for routine testing of vitamin D, at least 2-3 times per year. She was informed of the risk of over-replacement of vitamin D and agrees to not increase her dose unless she discusses this with [name] first. Amber Bentley agrees to follow up as directed.  At risk for osteopenia and  osteoporosis Amber Bentley is at risk for osteopenia and osteoporosis due to her vitamin D deficiency. She was encouraged to take her vitamin D and follow her higher calcium diet and increase strengthening exercise to help strengthen her bones and decrease her risk of osteopenia and osteoporosis.  Fracture of fifth Metatarsal Amber Bentley can start light activity per Dr. Lajoyce Corners.  Obesity Amber Bentley is currently in the action stage of change. As such, her goal is to continue with weight loss efforts She has agreed to keep a food journal with 1600 calories and 90+ grams of protein daily Amber Bentley has been instructed to work up to a goal of 150 minutes of combined cardio and strengthening exercise per week for weight loss and overall health benefits. We discussed the following Behavioral Modification Strategies today: planning for success, increasing lean protein intake, increasing vegetables and work on meal planning and easy cooking plans  Amber Bentley has agreed to follow up with our clinic in 2 weeks. She was informed of the importance of frequent follow up visits to maximize her success with intensive lifestyle modifications for her multiple health conditions.   OBESITY BEHAVIORAL INTERVENTION VISIT  Today's visit was # 11 out of 22.  Starting weight: 351 lbs Starting date: 12/30/17 Today's weight : 304 lbs Today's date: 06/16/2018 Total lbs lost to date: 40 (Patients must lose 7 lbs in the first 6 months to continue with counseling)   ASK: We discussed the diagnosis of obesity with Amber Bentley today and Amber Bentley agreed to give [name] permission to discuss obesity behavioral modification therapy today.  ASSESS: Amber Bentley has the diagnosis of obesity and her BMI today is 37.6 Amber Bentley is in the action stage of change  ADVISE: Amber Bentley was educated on the multiple health risks of obesity as well as the benefit of weight loss to improve her health. She was advised of the need for long term treatment and the importance of lifestyle  modifications.  AGREE: Multiple dietary modification options and treatment options were discussed and  [place] agreed to the above obesity treatment plan.  I, [place] Crane, am acting as transcriptionist for Filbert Schilder, MD  I have reviewed the above documentation for accuracy and completeness, and I agree with the above. - Debbra Riding, MD

## 2018-06-28 DIAGNOSIS — I159 Secondary hypertension, unspecified: Secondary | ICD-10-CM | POA: Diagnosis not present

## 2018-06-28 DIAGNOSIS — I5041 Acute combined systolic (congestive) and diastolic (congestive) heart failure: Secondary | ICD-10-CM | POA: Diagnosis not present

## 2018-06-28 DIAGNOSIS — I5032 Chronic diastolic (congestive) heart failure: Secondary | ICD-10-CM | POA: Diagnosis not present

## 2018-07-07 ENCOUNTER — Ambulatory Visit (INDEPENDENT_AMBULATORY_CARE_PROVIDER_SITE_OTHER): Payer: 59 | Admitting: Family Medicine

## 2018-07-07 ENCOUNTER — Encounter (INDEPENDENT_AMBULATORY_CARE_PROVIDER_SITE_OTHER): Payer: Self-pay | Admitting: Family Medicine

## 2018-07-07 VITALS — BP 136/93 | HR 57 | Temp 98.0°F | Ht 67.0 in | Wt 300.0 lb

## 2018-07-07 DIAGNOSIS — E559 Vitamin D deficiency, unspecified: Secondary | ICD-10-CM | POA: Diagnosis not present

## 2018-07-07 DIAGNOSIS — Z6841 Body Mass Index (BMI) 40.0 and over, adult: Secondary | ICD-10-CM | POA: Diagnosis not present

## 2018-07-07 DIAGNOSIS — R7303 Prediabetes: Secondary | ICD-10-CM

## 2018-07-07 DIAGNOSIS — Z9189 Other specified personal risk factors, not elsewhere classified: Secondary | ICD-10-CM

## 2018-07-07 MED ORDER — VITAMIN D (ERGOCALCIFEROL) 1.25 MG (50000 UNIT) PO CAPS: 50000.0000 [IU] | ORAL_CAPSULE | ORAL | 0 refills | Status: DC

## 2018-07-07 MED ORDER — METFORMIN HCL 500 MG PO TABS: ORAL_TABLET | ORAL | 0 refills | Status: DC

## 2018-07-07 MED FILL — VIT D2 1.25 MG (50,000 UNIT: 1.25 MG | 28 days supply | Qty: 4 | Fill #0

## 2018-07-07 MED FILL — metFORMIN HCL 500 MG TABS: 500 | 30 days supply | Qty: 30 | Fill #0

## 2018-07-07 NOTE — Progress Notes (Signed)
Office: [phone]  /  Fax: [phone]   HPI:   Chief Complaint: OBESITY Amber Bentley is here to discuss her progress with her obesity treatment plan. She is on the  keep a food journal with 1600 calories and 90+ grams of protein daily and is following her eating plan approximately 100 % of the time. She states she is walking, swimming and doing strength exercises for 30 minutes 4 times per week. Amber Bentley is doing well with journaling mentally and she is occasionally using the app. Marnie is getting more comfortable with her body. Her weight is 300 lb (136.1 kg) today and has had a weight loss of 4 pounds over a period of 3 weeks since her last visit. She has lost 51 lbs since starting treatment with [name].  Vitamin D deficiency Amber Bentley has a diagnosis of vitamin D deficiency. She is currently taking vit D and admits fatigue but denies nausea, vomiting or muscle weakness.  Pre-Diabetes Amber Bentley has a diagnosis of prediabetes based on her elevated Hgb A1c and was informed this puts her at greater risk of developing diabetes. She has no GI side effects of metformin and she has minimal carb cravings. Amber Bentley continues to work on diet and exercise to decrease risk of diabetes. She denies hypoglycemia.  At risk for diabetes Amber Bentley is at higher than average risk for developing diabetes due to her obesity and pre-diabetes. She currently denies polyuria or polydipsia.  ALLERGIES: Allergies  Allergen Reactions  . 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   [name] [Naproxen] Other (See Comments)    Pt states that this medication makes her loopy.     MEDICATIONS: Current Outpatient Medications on File Prior to Visit  Medication Sig Dispense Refill  . carvedilol (COREG) 6.25 MG tablet Take 1 tablet (6.25 mg total) by mouth 2 (two) times daily. 180 tablet 3  . lisinopril (PRINIVIL,ZESTRIL) 20 MG tablet TAKE 1 TABLET BY MOUTH ONCE DAILY 90 tablet 1  . pantoprazole (PROTONIX) 40 MG  tablet TAKE 1 TABLET BY MOUTH TWICE DAILY 180 tablet 1  . spironolactone (ALDACTONE) 25 MG tablet Take 25 mg by mouth daily.    . rosuvastatin (CRESTOR) 10 MG tablet Take 1 tablet (10 mg total) by mouth daily. 90 tablet 3   No current facility-administered medications on file prior to visit.     PAST MEDICAL HISTORY: Past Medical History:  Diagnosis Date  . Anemia   . Anxiety   . Cardiomegaly   . Chest pain   . CHF (congestive heart failure) (HCC)   . Constipation   . Depression   . Dyspnea   . Fatigue   . Food allergy    celery  . GERD (gastroesophageal reflux disease)   . Hip pain   . HLD (hyperlipidemia)   . HTN (hypertension)   . Infertility, female   . Lactose intolerance   . Leg edema   . Lower back pain   . OSA on CPAP   . Palpitations   . Prediabetes   . Shoulder pain   . Stomach ulcer   . Varicose vein of leg     PAST SURGICAL HISTORY: Past Surgical History:  Procedure Laterality Date  . ABDOMINAL HYSTERECTOMY    . ENDOMETRIAL ABLATION  2007   prior to hysterectomy    SOCIAL HISTORY: Social History   Tobacco Use  . Smoking status: Former Smoker    Packs/day: 0.25    Years: 10.00  Pack years: 2.50    Types: Cigarettes  . Smokeless tobacco: Never Used  Substance Use Topics  . Alcohol use: Yes    Comment: rarely  . Drug use: No    FAMILY HISTORY: Family History  Problem Relation Age of Onset  . Hypertension Mother   . Cancer Mother        breast cancer  . Kidney disease Mother   . Depression Mother   . Obesity Mother   . Diabetes Father   . Heart disease Father 84  . Hyperlipidemia Father   . Hypertension Father   . Depression Father   . Anxiety disorder Father   . Sleep apnea Father   . Obesity Father   . Thyroid disease Sister   . Sudden death Brother   . Cancer Maternal Aunt        breast cancer  . Cancer Paternal Grandfather        brain cancer    ROS: Review of Systems  Constitutional: Positive for malaise/fatigue  and weight loss.  Gastrointestinal: Negative for diarrhea, nausea and vomiting.  Genitourinary: Negative for frequency.  Musculoskeletal:       Negative for muscle weakness  Endo/Heme/Allergies: Negative for polydipsia.       Negative for hypoglycemia Positive for carb cravings    PHYSICAL EXAM: Blood pressure (!) 136/93, pulse (!) 57, temperature 98 F (36.7 C), temperature source Oral, height 5\' 7"  (1.702 m), weight 300 lb (136.1 kg), SpO2 96 %. Body mass index is 46.99 kg/m. Physical Exam  Constitutional: She is oriented to person, place, and time. She appears well-developed and well-nourished.  Cardiovascular: Normal rate.  Pulmonary/Chest: Effort normal.  Musculoskeletal: Normal range of motion.  Neurological: She is oriented to person, place, and time.  Skin: Skin is warm and dry.  Psychiatric: She has a normal mood and affect. Her behavior is normal.  Vitals reviewed.   RECENT LABS AND TESTS: BMET    Component Value Date/Time   NA 141 12/30/2017 1147   K 4.9 12/30/2017 1147   CL 102 12/30/2017 1147   CO2 23 12/30/2017 1147   GLUCOSE 108 (H) 12/30/2017 1147   GLUCOSE 113 (H) 06/20/2017 0856   BUN 17 12/30/2017 1147   CREATININE 0.87 12/30/2017 1147   CALCIUM 9.3 12/30/2017 1147   GFRNONAA 84 12/30/2017 1147   GFRAA 97 12/30/2017 1147   Lab Results  Component Value Date   HGBA1C 6.0 (H) 12/30/2017   HGBA1C 5.8 02/21/2017   Lab Results  Component Value Date   INSULIN 89.7 (H) 12/30/2017   CBC    Component Value Date/Time   WBC 9.3 12/30/2017 1147   WBC 9.6 06/20/2017 0856   RBC 5.18 12/30/2017 1147   RBC 5.04 06/20/2017 0856   HGB 13.4 12/30/2017 1147   HCT 43.1 12/30/2017 1147   PLT 224.0 06/20/2017 0856   MCV 83 12/30/2017 1147   MCH 25.9 (L) 12/30/2017 1147   MCH 24.1 (L) 04/27/2017 2055   MCHC 31.1 (L) 12/30/2017 1147   MCHC 32.7 06/20/2017 0856   RDW 13.6 12/30/2017 1147   LYMPHSABS 2.0 12/30/2017 1147   MONOABS 0.6 02/21/2017 1510    EOSABS 0.2 12/30/2017 1147   BASOSABS 0.0 12/30/2017 1147   Iron/TIBC/Ferritin/ %Sat    Component Value Date/Time   IRON 85 06/20/2017 0856   TIBC 344 06/20/2017 0856   FERRITIN 55.4 06/20/2017 0856   IRONPCTSAT 25 06/20/2017 0856   Lipid Panel     Component Value Date/Time  CHOL 120 12/30/2017 1147   TRIG 65 12/30/2017 1147   HDL 45 12/30/2017 1147   CHOLHDL 3.3 04/08/2017 1124   CHOLHDL 4.7 02/22/2017 0420   VLDL 11 02/22/2017 0420   LDLCALC 62 12/30/2017 1147   Hepatic Function Panel     Component Value Date/Time   PROT 7.3 12/30/2017 1147   ALBUMIN 4.5 12/30/2017 1147   AST 24 12/30/2017 1147   ALT 28 12/30/2017 1147   ALKPHOS 70 12/30/2017 1147   BILITOT 0.3 12/30/2017 1147   BILIDIR 0.4 04/14/2017 0315   IBILI 0.7 04/14/2017 0315      Component Value Date/Time   TSH 1.270 12/30/2017 1147   TSH 3.01 06/20/2017 0856   TSH 4.78 (H) 02/21/2017 1510   Results for ALAZNE, QUANT (MRN [id number]) as of 07/07/2018 17:25  Ref. Range 12/30/2017 11:47  Vitamin D, 25-Hydroxy Latest Ref Range: 30.0 - 100.0 ng/mL 11.0 (L)   ASSESSMENT AND PLAN: Vitamin D deficiency - Plan: Vitamin D, Ergocalciferol, (DRISDOL) 50000 units CAPS capsule  Prediabetes - Plan: metFORMIN (GLUCOPHAGE) 500 MG tablet  At risk for diabetes mellitus  Class 3 severe obesity with serious comorbidity and body mass index (BMI) of 45.0 to 49.9 in adult, unspecified obesity type (HCC)  PLAN:  Vitamin D Deficiency Amber Bentley was informed that low vitamin D levels contributes to fatigue and are associated with obesity, breast, and colon cancer. She agrees to continue to take prescription Vit D @50 ,000 IU every week #4 with no refills and will follow up for routine testing of vitamin D, at least 2-3 times per year. She was informed of the risk of over-replacement of vitamin D and agrees to not increase her dose unless she discusses this with [name] first. Amber Bentley agrees to follow up as directed.  Pre-Diabetes Amber Bentley  will continue to work on weight loss, exercise, and decreasing simple carbohydrates in her diet to help decrease the risk of diabetes. We dicussed metformin including benefits and risks. She was informed that eating too many simple carbohydrates or too many calories at one sitting increases the likelihood of GI side effects. Viera requested metformin for now and a prescription was written today for 1 month refill. Amber Bentley agreed to follow up with [name] as directed to monitor her progress.  Diabetes risk counseling Amber Bentley was given extended (15 minutes) diabetes prevention counseling today. She is 42 y.o. female and has risk factors for diabetes including obesity and pre-diabetes. We discussed intensive lifestyle modifications today with an emphasis on weight loss as well as increasing exercise and decreasing simple carbohydrates in her diet.  Obesity Amber Bentley is currently in the action stage of change. As such, her goal is to continue with weight loss efforts She has agreed to keep a food journal with 1600 calories and 90+ grams of protein daily Amber Bentley has been instructed to work up to a goal of 150 minutes of combined cardio and strengthening exercise per week for weight loss and overall health benefits. We discussed the following Behavioral Modification Strategies today: planning for success, increasing lean protein intake, increasing vegetables and work on meal planning and easy cooking plans  Amber Bentley has agreed to follow up with our clinic in 3 to 4 weeks. She was informed of the importance of frequent follow up visits to maximize her success with intensive lifestyle modifications for her multiple health conditions.   OBESITY BEHAVIORAL INTERVENTION VISIT  Today's visit was # 12 out of 22.  Starting weight: 351 lbs Starting date: 12/30/17 Today's weight : 300  lbs Today's date: 07/07/2018 Total lbs lost to date: 86    ASK: We discussed the diagnosis of obesity with Amber Bentley today and Amber Bentley agreed  to give [name] permission to discuss obesity behavioral modification therapy today.  ASSESS: Amber Bentley has the diagnosis of obesity and her BMI today is 46.98 Amber Bentley is in the action stage of change   ADVISE: Amber Bentley was educated on the multiple health risks of obesity as well as the benefit of weight loss to improve her health. She was advised of the need for long term treatment and the importance of lifestyle modifications.  AGREE: Multiple dietary modification options and treatment options were discussed and  Amber Bentley agreed to the above obesity treatment plan.  I, [place] Crane, am acting as transcriptionist for Filbert Schilder, MD  I have reviewed the above documentation for accuracy and completeness, and I agree with the above. - Debbra Riding, MD

## 2018-07-08 LAB — VITAMIN D 25 HYDROXY (VIT D DEFICIENCY, FRACTURES): Vit D, 25-Hydroxy: 35.5 ng/mL (ref 30.0–100.0)

## 2018-07-08 LAB — HEMOGLOBIN A1C
ESTIMATED AVERAGE GLUCOSE: 108 mg/dL
HEMOGLOBIN A1C: 5.4 % (ref 4.8–5.6)

## 2018-07-08 LAB — INSULIN, RANDOM: INSULIN: 18 u[IU]/mL (ref 2.6–24.9)

## 2018-07-11 ENCOUNTER — Other Ambulatory Visit: Payer: Self-pay | Admitting: Cardiovascular Disease

## 2018-07-11 MED FILL — ROSUVASTATIN CALCIUM 10 MG: 10 | 90 days supply | Qty: 90 | Fill #0

## 2018-07-11 MED FILL — LISINOPRIL 20 MG TABLET: 20 | 90 days supply | Qty: 90 | Fill #1

## 2018-07-29 DIAGNOSIS — I5032 Chronic diastolic (congestive) heart failure: Secondary | ICD-10-CM | POA: Diagnosis not present

## 2018-07-29 DIAGNOSIS — I5041 Acute combined systolic (congestive) and diastolic (congestive) heart failure: Secondary | ICD-10-CM | POA: Diagnosis not present

## 2018-07-29 DIAGNOSIS — I159 Secondary hypertension, unspecified: Secondary | ICD-10-CM | POA: Diagnosis not present

## 2018-08-04 ENCOUNTER — Ambulatory Visit (INDEPENDENT_AMBULATORY_CARE_PROVIDER_SITE_OTHER): Payer: 59 | Admitting: Family Medicine

## 2018-08-04 VITALS — BP 175/94 | HR 70 | Temp 97.7°F | Ht 67.0 in | Wt 309.0 lb

## 2018-08-04 DIAGNOSIS — Z6841 Body Mass Index (BMI) 40.0 and over, adult: Secondary | ICD-10-CM

## 2018-08-04 DIAGNOSIS — Z9189 Other specified personal risk factors, not elsewhere classified: Secondary | ICD-10-CM | POA: Diagnosis not present

## 2018-08-04 DIAGNOSIS — E559 Vitamin D deficiency, unspecified: Secondary | ICD-10-CM

## 2018-08-04 DIAGNOSIS — I10 Essential (primary) hypertension: Secondary | ICD-10-CM

## 2018-08-04 MED ORDER — VITAMIN D (ERGOCALCIFEROL) 1.25 MG (50000 UNIT) PO CAPS: 50000.0000 [IU] | ORAL_CAPSULE | ORAL | 0 refills | Status: DC

## 2018-08-04 NOTE — Progress Notes (Signed)
Office: [phone]  /  Fax: [phone]   HPI:   Chief Complaint: OBESITY Amber Bentley is here to discuss her progress with her obesity treatment plan. She is on the keep a food journal with 1600 calories and 90+ grams of protein daily and is following her eating plan approximately 100 % of the time. She states she is hiking, swimming, and weight training for 150 minutes per week. Amber Bentley found herself stress eating especially later at night. She realizes she isn't eating nearly enough protein. She notes minimal hunger during th day.  Her weight is (!) 309 lb (140.2 kg) today and has gained 9 pounds since her last visit. She has lost 42 lbs since starting treatment with [name].  Vitamin D Deficiency Amber Bentley has a diagnosis of vitamin D deficiency. She is currently taking prescription Vit D. She notes fatigue and denies nausea, vomiting or muscle weakness.  At risk for osteopenia and osteoporosis Amber Bentley is at higher risk of osteopenia and osteoporosis due to vitamin D deficiency.   Hypertension Amber Bentley is a 42 y.o. female with hypertension. [name]'s blood pressure is elevated today. She thinks she may have skipped blood pressure medications today. She denies chest pain, chest pressure, or headache. She is working weight loss to help control her blood pressure with the goal of decreasing her risk of heart attack and stroke. Amber Bentley's blood pressure is not currently controlled.  ALLERGIES: Allergies  Allergen Reactions  . 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   [name] [Naproxen] Other (See Comments)    Pt states that this medication makes her loopy.     MEDICATIONS: Current Outpatient Medications on File Prior to Visit  Medication Sig Dispense Refill  . carvedilol (COREG) 6.25 MG tablet Take 1 tablet (6.25 mg total) by mouth 2 (two) times daily. 180 tablet 3  . lisinopril (PRINIVIL,ZESTRIL) 20 MG tablet TAKE 1 TABLET BY MOUTH ONCE DAILY 90 tablet 1  . metFORMIN  (GLUCOPHAGE) 500 MG tablet TAKE 1 TABLET BY MOUTH ONCE DAILY WITH BREAKFAST 30 tablet 0  . pantoprazole (PROTONIX) 40 MG tablet TAKE 1 TABLET BY MOUTH TWICE DAILY 180 tablet 1  . rosuvastatin (CRESTOR) 10 MG tablet TAKE 1 TABLET BY MOUTH DAILY. 90 tablet 3  . spironolactone (ALDACTONE) 25 MG tablet Take 25 mg by mouth daily.     No current facility-administered medications on file prior to visit.     PAST MEDICAL HISTORY: Past Medical History:  Diagnosis Date  . Anemia   . Anxiety   . Cardiomegaly   . Chest pain   . CHF (congestive heart failure) (HCC)   . Constipation   . Depression   . Dyspnea   . Fatigue   . Food allergy    celery  . GERD (gastroesophageal reflux disease)   . Hip pain   . HLD (hyperlipidemia)   . HTN (hypertension)   . Infertility, female   . Lactose intolerance   . Leg edema   . Lower back pain   . OSA on CPAP   . Palpitations   . Prediabetes   . Shoulder pain   . Stomach ulcer   . Varicose vein of leg     PAST SURGICAL HISTORY: Past Surgical History:  Procedure Laterality Date  . ABDOMINAL HYSTERECTOMY    . ENDOMETRIAL ABLATION  2007   prior to hysterectomy    SOCIAL HISTORY: Social History   Tobacco Use  . Smoking status: Former Smoker  Packs/day: 0.25    Years: 10.00    Pack years: 2.50    Types: Cigarettes  . Smokeless tobacco: Never Used  Substance Use Topics  . Alcohol use: Yes    Comment: rarely  . Drug use: No    FAMILY HISTORY: Family History  Problem Relation Age of Onset  . Hypertension Mother   . Cancer Mother        breast cancer  . Kidney disease Mother   . Depression Mother   . Obesity Mother   . Diabetes Father   . Heart disease Father 64  . Hyperlipidemia Father   . Hypertension Father   . Depression Father   . Anxiety disorder Father   . Sleep apnea Father   . Obesity Father   . Thyroid disease Sister   . Sudden death Brother   . Cancer Maternal Aunt        breast cancer  . Cancer Paternal  Grandfather        brain cancer    ROS: Review of Systems  Constitutional: Positive for malaise/fatigue. Negative for weight loss.  Cardiovascular: Negative for chest pain.       Negative chest pressure  Gastrointestinal: Negative for nausea and vomiting.  Musculoskeletal:       Negative muscle weakness  Neurological: Negative for headaches.    PHYSICAL EXAM: Blood pressure (!) 175/94, pulse 70, temperature 97.7 F (36.5 C), temperature source Oral, height 5\' 7"  (1.702 m), weight (!) 309 lb (140.2 kg), SpO2 96 %. Body mass index is 48.4 kg/m. Physical Exam  Constitutional: She is oriented to person, place, and time. She appears well-developed and well-nourished.  Cardiovascular: Normal rate.  Pulmonary/Chest: Effort normal.  Musculoskeletal: Normal range of motion.  Neurological: She is oriented to person, place, and time.  Skin: Skin is warm and dry.  Psychiatric: She has a normal mood and affect. Her behavior is normal.  Vitals reviewed.   RECENT LABS AND TESTS: BMET    Component Value Date/Time   NA 141 12/30/2017 1147   K 4.9 12/30/2017 1147   CL 102 12/30/2017 1147   CO2 23 12/30/2017 1147   GLUCOSE 108 (H) 12/30/2017 1147   GLUCOSE 113 (H) 06/20/2017 0856   BUN 17 12/30/2017 1147   CREATININE 0.87 12/30/2017 1147   CALCIUM 9.3 12/30/2017 1147   GFRNONAA 84 12/30/2017 1147   GFRAA 97 12/30/2017 1147   Lab Results  Component Value Date   HGBA1C 5.4 07/07/2018   HGBA1C 6.0 (H) 12/30/2017   HGBA1C 5.8 02/21/2017   Lab Results  Component Value Date   INSULIN 18.0 07/07/2018   INSULIN 89.7 (H) 12/30/2017   CBC    Component Value Date/Time   WBC 9.3 12/30/2017 1147   WBC 9.6 06/20/2017 0856   RBC 5.18 12/30/2017 1147   RBC 5.04 06/20/2017 0856   HGB 13.4 12/30/2017 1147   HCT 43.1 12/30/2017 1147   PLT 224.0 06/20/2017 0856   MCV 83 12/30/2017 1147   MCH 25.9 (L) 12/30/2017 1147   MCH 24.1 (L) 04/27/2017 2055   MCHC 31.1 (L) 12/30/2017 1147    MCHC 32.7 06/20/2017 0856   RDW 13.6 12/30/2017 1147   LYMPHSABS 2.0 12/30/2017 1147   MONOABS 0.6 02/21/2017 1510   EOSABS 0.2 12/30/2017 1147   BASOSABS 0.0 12/30/2017 1147   Iron/TIBC/Ferritin/ %Sat    Component Value Date/Time   IRON 85 06/20/2017 0856   TIBC 344 06/20/2017 0856   FERRITIN 55.4 06/20/2017 0856  IRONPCTSAT 25 06/20/2017 0856   Lipid Panel     Component Value Date/Time   CHOL 120 12/30/2017 1147   TRIG 65 12/30/2017 1147   HDL 45 12/30/2017 1147   CHOLHDL 3.3 04/08/2017 1124   CHOLHDL 4.7 02/22/2017 0420   VLDL 11 02/22/2017 0420   LDLCALC 62 12/30/2017 1147   Hepatic Function Panel     Component Value Date/Time   PROT 7.3 12/30/2017 1147   ALBUMIN 4.5 12/30/2017 1147   AST 24 12/30/2017 1147   ALT 28 12/30/2017 1147   ALKPHOS 70 12/30/2017 1147   BILITOT 0.3 12/30/2017 1147   BILIDIR 0.4 04/14/2017 0315   IBILI 0.7 04/14/2017 0315      Component Value Date/Time   TSH 1.270 12/30/2017 1147   TSH 3.01 06/20/2017 0856   TSH 4.78 (H) 02/21/2017 1510  Results for CANA, OBLINGER (MRN [id number]) as of 08/04/2018 16:59  Ref. Range 07/07/2018 15:38  Vitamin D, 25-Hydroxy Latest Ref Range: 30.0 - 100.0 ng/mL 35.5    ASSESSMENT AND PLAN: Vitamin D deficiency - Plan: Vitamin D, Ergocalciferol, (DRISDOL) 50000 units CAPS capsule  Essential hypertension  At risk for osteoporosis  Class 3 severe obesity with serious comorbidity and body mass index (BMI) of 45.0 to 49.9 in adult, unspecified obesity type (HCC)  PLAN:  Vitamin D Deficiency Amber Bentley was informed that low vitamin D levels contributes to fatigue and are associated with obesity, breast, and colon cancer. Amber Bentley agrees to continue taking prescription Vit D @50 ,000 IU every week #4 and we will refill for 1 month. She will follow up for routine testing of vitamin D, at least 2-3 times per year. She was informed of the risk of over-replacement of vitamin D and agrees to not increase her dose unless she  discusses this with [name] first. Amber Bentley agrees to follow up with our clinic in 2 weeks.  At risk for osteopenia and osteoporosis Amber Bentley is at risk for osteopenia and osteoporsis due to her vitamin D deficiency. She was encouraged to take her vitamin D and follow her higher calcium diet and increase strengthening exercise to help strengthen her bones and decrease her risk of osteopenia and osteoporosis.  Hypertension We discussed sodium restriction, working on healthy weight loss, and a regular exercise program as the means to achieve improved blood pressure control. Amber Bentley agreed with this plan and agreed to follow up as directed. We will continue to monitor her blood pressure as well as her progress with the above lifestyle modifications. We will follow up on blood pressure at next appointment. She will continue her medications and will watch for signs of hypotension as she continues her lifestyle modifications. Amber Bentley agrees to follow up with our clinic in 2 weeks.  Obesity Amber Bentley is currently in the action stage of change. As such, her goal is to continue with weight loss efforts She has agreed to keep a food journal with 1600-1650 calories and 90+ grams of protein daily Amber Bentley has been instructed to work up to a goal of 150 minutes of combined cardio and strengthening exercise per week for weight loss and overall health benefits. We discussed the following Behavioral Modification Strategies today: increasing lean protein intake, increasing vegetables, and better snacking choices   Amber Bentley has agreed to follow up with our clinic in 2 weeks. She was informed of the importance of frequent follow up visits to maximize her success with intensive lifestyle modifications for her multiple health conditions.   OBESITY BEHAVIORAL INTERVENTION VISIT  Today's visit was #  13 out of 22.  Starting weight: 351 lbs Starting date: 12/30/17 Today's weight : 309 lbs Today's date: 08/04/2018 Total lbs lost to date:  73    ASK: We discussed the diagnosis of obesity with Amber Bentley today and Amber Bentley agreed to give [name] permission to discuss obesity behavioral modification therapy today.  ASSESS: Amber Bentley has the diagnosis of obesity and her BMI today is 70.38 Amber Bentley is in the action stage of change   ADVISE: Amber Bentley was educated on the multiple health risks of obesity as well as the benefit of weight loss to improve her health. She was advised of the need for long term treatment and the importance of lifestyle modifications.  AGREE: Multiple dietary modification options and treatment options were discussed and  Amber Bentley agreed to the above obesity treatment plan.  I, Burt Knack, am acting as transcriptionist for Debbra Riding, MD  I have reviewed the above documentation for accuracy and completeness, and I agree with the above. - Debbra Riding, MD

## 2018-08-19 ENCOUNTER — Ambulatory Visit (INDEPENDENT_AMBULATORY_CARE_PROVIDER_SITE_OTHER): Payer: 59 | Admitting: Family Medicine

## 2018-08-19 VITALS — BP 152/82 | HR 67 | Temp 97.8°F | Ht 67.0 in | Wt 299.0 lb

## 2018-08-19 DIAGNOSIS — E559 Vitamin D deficiency, unspecified: Secondary | ICD-10-CM

## 2018-08-19 DIAGNOSIS — Z6841 Body Mass Index (BMI) 40.0 and over, adult: Secondary | ICD-10-CM | POA: Diagnosis not present

## 2018-08-19 DIAGNOSIS — I10 Essential (primary) hypertension: Secondary | ICD-10-CM | POA: Diagnosis not present

## 2018-08-20 NOTE — Progress Notes (Signed)
Office: [phone]  /  Fax: [phone]   HPI:   Chief Complaint: OBESITY Amber Bentley is here to discuss her progress with her obesity treatment plan. She is on the keep a food journal with 1600 to 1650 calories and 90+ grams of protein daily and is following her eating plan approximately 85 % of the time. She states she is exercising 150 minutes per week, walking and biking. 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 Amber Bentley is skipping meals and she is not getting all of her calories or protein intake. Her weight is 299 lb (135.6 kg) today and has had a weight loss of 10 pounds over a period of 2 weeks since her last visit. She has lost 62 lbs since starting treatment with [name].  Hypertension Amber Bentley is a 41 y.o. female with hypertension. Her blood pressure is slightly elevated today, but it is better controlled than at her last visit. She hasn't been taking her medications consistently as she forgot to take them for a few days. Amber Bentley denies chest pain or shortness of breath on exertion. She is working weight loss to help control her blood pressure with the goal of decreasing her risk of heart attack and stroke. [name] blood pressure is not currently controlled.  Vitamin D deficiency Amber Bentley has a diagnosis of vitamin D deficiency. Amber Bentley is currently taking vit D and she admits fatigue, but denies nausea, vomiting or muscle weakness.  ALLERGIES: Allergies  Allergen Reactions  . 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   Amber Bentley [Naproxen] Other (See Comments)    Pt states that this medication makes her loopy.     MEDICATIONS: Current Outpatient Medications on File Prior to Visit  Medication Sig Dispense Refill  . carvedilol (COREG) 6.25 MG tablet Take 1 tablet (6.25 mg total) by mouth 2 (two) times daily. 180 tablet 3  . lisinopril (PRINIVIL,ZESTRIL) 20 MG tablet TAKE 1 TABLET BY MOUTH ONCE DAILY 90  tablet 1  . metFORMIN (GLUCOPHAGE) 500 MG tablet TAKE 1 TABLET BY MOUTH ONCE DAILY WITH BREAKFAST 30 tablet 0  . pantoprazole (PROTONIX) 40 MG tablet TAKE 1 TABLET BY MOUTH TWICE DAILY 180 tablet 1  . rosuvastatin (CRESTOR) 10 MG tablet TAKE 1 TABLET BY MOUTH DAILY. 90 tablet 3  . spironolactone (ALDACTONE) 25 MG tablet Take 25 mg by mouth daily.    . Vitamin D, Ergocalciferol, (DRISDOL) 50000 units CAPS capsule Take 1 capsule (50,000 Units total) by mouth every 7 (seven) days. 4 capsule 0   No current facility-administered medications on file prior to visit.     PAST MEDICAL HISTORY: Past Medical History:  Diagnosis Date  . Anemia   . Anxiety   . Cardiomegaly   . Chest pain   . CHF (congestive heart failure) (HCC)   . Constipation   . Depression   . Dyspnea   . Fatigue   . Food allergy    celery  . GERD (gastroesophageal reflux disease)   . Hip pain   . HLD (hyperlipidemia)   . HTN (hypertension)   . Infertility, female   . Lactose intolerance   . Leg edema   . Lower back pain   . OSA on CPAP   . Palpitations   . Prediabetes   . Shoulder pain   . Stomach ulcer   . Varicose vein of leg     PAST SURGICAL  HISTORY: Past Surgical History:  Procedure Laterality Date  . ABDOMINAL HYSTERECTOMY    . ENDOMETRIAL ABLATION  2007   prior to hysterectomy    SOCIAL HISTORY: Social History   Tobacco Use  . Smoking status: Former Smoker    Packs/day: 0.25    Years: 10.00    Pack years: 2.50    Types: Cigarettes  . Smokeless tobacco: Never Used  Substance Use Topics  . Alcohol use: Yes    Comment: rarely  . Drug use: No    FAMILY HISTORY: Family History  Problem Relation Age of Onset  . Hypertension Mother   . Cancer Mother        breast cancer  . Kidney disease Mother   . Depression Mother   . Obesity Mother   . Diabetes Father   . Heart disease Father 15  . Hyperlipidemia Father   . Hypertension Father   . Depression Father   . Anxiety disorder Father     . Sleep apnea Father   . Obesity Father   . Thyroid disease Sister   . Sudden death Brother   . Cancer Maternal Aunt        breast cancer  . Cancer Paternal Grandfather        brain cancer    ROS: Review of Systems  Constitutional: Positive for malaise/fatigue and weight loss.  Respiratory: Negative for shortness of breath (on exertion).   Cardiovascular: Negative for chest pain.  Gastrointestinal: Negative for nausea and vomiting.  Musculoskeletal:       Negative for muscle weakness    PHYSICAL EXAM: Blood pressure (!) 152/82, pulse 67, temperature 97.8 F (36.6 C), temperature source Oral, height 5\' 7"  (1.702 m), weight 299 lb (135.6 kg), SpO2 98 %. Body mass index is 46.83 kg/m. Physical Exam  Constitutional: She is oriented to person, place, and time. She appears well-developed and well-nourished.  Cardiovascular: Normal rate.  Pulmonary/Chest: Effort normal.  Musculoskeletal: Normal range of motion.  Neurological: She is oriented to person, place, and time.  Skin: Skin is warm and dry.  Psychiatric: She has a normal mood and affect. Her behavior is normal.  Vitals reviewed.   RECENT LABS AND TESTS: BMET    Component Value Date/Time   NA 141 12/30/2017 1147   K 4.9 12/30/2017 1147   CL 102 12/30/2017 1147   CO2 23 12/30/2017 1147   GLUCOSE 108 (H) 12/30/2017 1147   GLUCOSE 113 (H) 06/20/2017 0856   BUN 17 12/30/2017 1147   CREATININE 0.87 12/30/2017 1147   CALCIUM 9.3 12/30/2017 1147   GFRNONAA 84 12/30/2017 1147   GFRAA 97 12/30/2017 1147   Lab Results  Component Value Date   HGBA1C 5.4 07/07/2018   HGBA1C 6.0 (H) 12/30/2017   HGBA1C 5.8 02/21/2017   Lab Results  Component Value Date   INSULIN 18.0 07/07/2018   INSULIN 89.7 (H) 12/30/2017   CBC    Component Value Date/Time   WBC 9.3 12/30/2017 1147   WBC 9.6 06/20/2017 0856   RBC 5.18 12/30/2017 1147   RBC 5.04 06/20/2017 0856   HGB 13.4 12/30/2017 1147   HCT 43.1 12/30/2017 1147   PLT  224.0 06/20/2017 0856   MCV 83 12/30/2017 1147   MCH 25.9 (L) 12/30/2017 1147   MCH 24.1 (L) 04/27/2017 2055   MCHC 31.1 (L) 12/30/2017 1147   MCHC 32.7 06/20/2017 0856   RDW 13.6 12/30/2017 1147   LYMPHSABS 2.0 12/30/2017 1147   MONOABS 0.6 02/21/2017 1510  EOSABS 0.2 12/30/2017 1147   BASOSABS 0.0 12/30/2017 1147   Iron/TIBC/Ferritin/ %Sat    Component Value Date/Time   IRON 85 06/20/2017 0856   TIBC 344 06/20/2017 0856   FERRITIN 55.4 06/20/2017 0856   IRONPCTSAT 25 06/20/2017 0856   Lipid Panel     Component Value Date/Time   CHOL 120 12/30/2017 1147   TRIG 65 12/30/2017 1147   HDL 45 12/30/2017 1147   CHOLHDL 3.3 04/08/2017 1124   CHOLHDL 4.7 02/22/2017 0420   VLDL 11 02/22/2017 0420   LDLCALC 62 12/30/2017 1147   Hepatic Function Panel     Component Value Date/Time   PROT 7.3 12/30/2017 1147   ALBUMIN 4.5 12/30/2017 1147   AST 24 12/30/2017 1147   ALT 28 12/30/2017 1147   ALKPHOS 70 12/30/2017 1147   BILITOT 0.3 12/30/2017 1147   BILIDIR 0.4 04/14/2017 0315   IBILI 0.7 04/14/2017 0315      Component Value Date/Time   TSH 1.270 12/30/2017 1147   TSH 3.01 06/20/2017 0856   TSH 4.78 (H) 02/21/2017 1510   Results for ARLETT, GOOLD (MRN [id number]) as of 08/20/2018 10:40  Ref. Range 07/07/2018 15:38  Vitamin D, 25-Hydroxy Latest Ref Range: 30.0 - 100.0 ng/mL 35.5   ASSESSMENT AND PLAN: Essential hypertension  Vitamin D deficiency  Class 3 severe obesity with serious comorbidity and body mass index (BMI) of 45.0 to 49.9 in adult, unspecified obesity type (HCC)  PLAN:  Hypertension We discussed sodium restriction, working on healthy weight loss, and a regular exercise program as the means to achieve improved blood pressure control. Amber Bentley agreed with this plan and agreed to follow up as directed. We will follow up with her blood pressure at the next appointment and will continue to monitor her blood pressure as well as her progress with the above lifestyle  modifications. She will continue her medications as prescribed and will watch for signs of hypotension as she continues her lifestyle modifications.  Vitamin D Deficiency Amber Bentley was informed that low vitamin D levels contributes to fatigue and are associated with obesity, breast, and colon cancer. She agrees to continue to take prescription Vit D @50 ,000 IU every week (no refill needed) and will follow up for routine testing of vitamin D, at least 2-3 times per year. She was informed of the risk of over-replacement of vitamin D and agrees to not increase her dose unless she discusses this with [name] first.  I spent > than 50% of the 15 minute visit on counseling as documented in the note.  Obesity Amber Bentley is currently in the action stage of change. As such, her goal is to continue with weight loss efforts She has agreed to keep a food journal with 1600 calories and 90+ grams of protein daily Amber Bentley has been instructed to work up to a goal of 150 minutes of combined cardio and strengthening exercise per week for weight loss and overall health benefits. We discussed the following Behavioral Modification Strategies today: no skipping meals, keep a strict food journal and work on meal planning and easy cooking plans  Amber Bentley has agreed to follow up with our clinic in 2 weeks. She was informed of the importance of frequent follow up visits to maximize her success with intensive lifestyle modifications for her multiple health conditions.   OBESITY BEHAVIORAL INTERVENTION VISIT  Today's visit was # 14   Starting weight: 351 lbs Starting date: 12/30/17 Today's weight : 299 lbs  Today's date: 08/19/2018 Total lbs lost to date:  62   ASK: We discussed the diagnosis of obesity with Amber Bentley today and Amber Bentley agreed to give [name] permission to discuss obesity behavioral modification therapy today.  ASSESS: Amber Bentley has the diagnosis of obesity and her BMI today is 70.82 Amber Bentley is in the action stage of change    ADVISE: Amber Bentley was educated on the multiple health risks of obesity as well as the benefit of weight loss to improve her health. She was advised of the need for long term treatment and the importance of lifestyle modifications to improve her current health and to decrease her risk of future health problems.  AGREE: Multiple dietary modification options and treatment options were discussed and  Amber Bentley agreed to follow the recommendations documented in the above note.  ARRANGE: Amber Bentley was educated on the importance of frequent visits to treat obesity as outlined per CMS and USPSTF guidelines and agreed to schedule her next follow up appointment today.  I, Nevada Crane, am acting as transcriptionist for Filbert Schilder, MD  I have reviewed the above documentation for accuracy and completeness, and I agree with the above. - Debbra Riding, MD

## 2018-08-25 ENCOUNTER — Ambulatory Visit (INDEPENDENT_AMBULATORY_CARE_PROVIDER_SITE_OTHER): Payer: 59 | Admitting: Cardiovascular Disease

## 2018-08-25 ENCOUNTER — Encounter: Payer: Self-pay | Admitting: Cardiovascular Disease

## 2018-08-25 VITALS — BP 122/80 | HR 68 | Ht 67.0 in | Wt 302.8 lb

## 2018-08-25 DIAGNOSIS — I10 Essential (primary) hypertension: Secondary | ICD-10-CM

## 2018-08-25 DIAGNOSIS — I5022 Chronic systolic (congestive) heart failure: Secondary | ICD-10-CM

## 2018-08-25 NOTE — Progress Notes (Signed)
Cardiology Office Note   Date:  08/25/2018   ID:  Amber Bentley, DOB 03-31-1976, MRN [id number]  PCP:  Amber Ng, NP  Cardiologist:   Amber Miss, MD   Chief Complaint  Patient presents with  . Congestive Heart Failure  . Hypertension   Problem list 1. Hypertensive emergency 2. Morbid obesity 3. Pulmonary hypertension-moderate 4. Possible sleep apnea    Amber Bentley is a 43 y.o. female who presents for follow-up of her recent hospitalization for hypertensive emergency.  Amber Bentley has had high blood pressure for several years but has not been on medications.  I saw her in the [facility] emergency. Room. She has been started on medications. She was in the hospital for 4 days  She seems to be tolerating her medications without any problems.  She had an echocardiogram which revealed mildly depressed left ventricle systolic function with an EF of 45-50%. She has grade 2 diastolic dysfunction.  She has moderate pulmonary hypertension with an estimate PA pressure of 59.  Is avoiding salt. 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  Is a Administrator .  Smokes some .    April 08, 2017:  Amber Bentley is seen as a work in visit today for chest pain . Stopped smoking in March .  Has gained some weight .  Wt. Today is 303 .   Has cluster headaches.   Wt Readings from Last 3 Encounters:  08/25/18 (!) 302 lb 12.8 oz (137.3 kg)  08/19/18 299 lb (135.6 kg)  08/04/18 (!) 309 lb (140.2 kg)    Amber Bentley started having some CP several years ago . Overall had a pretty good weekend - not much in the way of CP. The CP is a heaviness,   Last for hours.   Lasted most of the day yesterday . Seems to related to upper body movement - was not worsened with walking .     Has a significant family hx of CAD   [name], 2018: Amber Bentley is seen  Wt is 333.   Has not been exercising .   Teaches preschool .  Has 2 children  - age 75 ( son) and 36 ( daughter) . Eats poorly,   Knows that she eats the wrong foods.  Blames stress   BP has been stable , no heart failure symptoms  Has cluster headaches.   Developed a MRSA infection on her nose related to her CPAP .   Apr 16, 2018: Amber Bentley is seen today for follow-up of her hypertension and chronic combined systolic and diastolic congestive heart failure.  Her blood pressures is well controlled.  Wt = 311 ( down  from 324 on March, 2019  Has lost 45 lbs total   Has been having some dizziness since she has been losing weight  Lots of orthostatic hypotension   Still has chest pain  - especially if she puls her CPAP off at night   Has stopped smoking   Is exercising .  Walks 4 days a week .   Breakfast - 2 egg omlett with thin slice of cheese on a wrap , fruit  Lunch -  Lean cuisine, yogart, cup of fruit Dinner - 6 oz of lean meat, 2 cups of veggies  [name], 2019:  Has continued to lose weight .  Seen with wife , Amber Bentley.   Wt is 302 now .  Has mild CP  Still doing well with her diet  Exercising .       Past Medical History:  Diagnosis Date  . Anemia   . Anxiety   . Cardiomegaly   . Chest pain   . CHF (congestive heart failure) (HCC)   . Constipation   . Depression   . Dyspnea   . Fatigue   . Food allergy    celery  . GERD (gastroesophageal reflux disease)   . Hip pain   . HLD (hyperlipidemia)   . HTN (hypertension)   . Infertility, female   . Lactose intolerance   . Leg edema   . Lower back pain   . OSA on CPAP   . Palpitations   . Prediabetes   . Shoulder pain   . Stomach ulcer   . Varicose vein of leg     Past Surgical History:  Procedure Laterality Date  . ABDOMINAL HYSTERECTOMY    . ENDOMETRIAL ABLATION  2007   prior to hysterectomy     Current Outpatient Medications  Medication Sig Dispense Refill  . carvedilol (COREG) 6.25 MG tablet Take 1 tablet (6.25 mg total) by mouth 2 (two) times daily. 180 tablet 3  .  lisinopril (PRINIVIL,ZESTRIL) 20 MG tablet TAKE 1 TABLET BY MOUTH ONCE DAILY 90 tablet 1  . metFORMIN (GLUCOPHAGE) 500 MG tablet TAKE 1 TABLET BY MOUTH ONCE DAILY WITH BREAKFAST 30 tablet 0  . pantoprazole (PROTONIX) 40 MG tablet TAKE 1 TABLET BY MOUTH TWICE DAILY 180 tablet 1  . rosuvastatin (CRESTOR) 10 MG tablet TAKE 1 TABLET BY MOUTH DAILY. 90 tablet 3  . spironolactone (ALDACTONE) 25 MG tablet Take 25 mg by mouth daily.    . Vitamin D, Ergocalciferol, (DRISDOL) 50000 units CAPS capsule Take 1 capsule (50,000 Units total) by mouth every 7 (seven) days. 4 capsule 0   No current facility-administered medications for this visit.     No flowsheet data found.    Allergies:   Food and Aleve [naproxen]    Social History:  The patient  reports that she has quit smoking. Her smoking use included cigarettes. She has a 2.50 pack-year smoking history. She has never used smokeless tobacco. She reports that she drinks alcohol. She reports that she does not use drugs.   Family History:  The patient's family history includes Anxiety disorder in her father; Cancer in her maternal aunt, mother, and paternal grandfather; Depression in her father and mother; Diabetes in her father; Heart disease (age of onset: 61) in her father; Hyperlipidemia in her father; Hypertension in her father and mother; Kidney disease in her mother; Obesity in her father and mother; Sleep apnea in her father; Sudden death in her brother; Thyroid disease in her sister.    ROS:    Noted in current history, otherwise review of systems is negative.  Physical Exam: Blood pressure 122/80, pulse 68, height 5\' 7"  (1.702 m), weight (!) 302 lb 12.8 oz (137.3 kg), SpO2 99 %.  GEN:  Young female,   HEENT: Normal NECK: No JVD; No carotid bruits LYMPHATICS: No lymphadenopathy CARDIAC: RRR   RESPIRATORY:  Clear to auscultation without rales, wheezing or rhonchi  ABDOMEN:   Obese, soft,  Non tender  MUSCULOSKELETAL:  No edema; No  deformity  SKIN: Warm and dry NEUROLOGIC:  Alert and oriented x 3     EKG:     Recent Labs: 12/30/2017: ALT 28; BUN 17; Creatinine, Ser 0.87; Hemoglobin 13.4; Potassium 4.9; Sodium 141; TSH  1.270    Lipid Panel    Component Value Date/Time   CHOL 120 12/30/2017 1147   TRIG 65 12/30/2017 1147   HDL 45 12/30/2017 1147   CHOLHDL 3.3 04/08/2017 1124   CHOLHDL 4.7 02/22/2017 0420   VLDL 11 02/22/2017 0420   LDLCALC 62 12/30/2017 1147      Wt Readings from Last 3 Encounters:  08/25/18 (!) 302 lb 12.8 oz (137.3 kg)  08/19/18 299 lb (135.6 kg)  08/04/18 (!) 309 lb (140.2 kg)      Other studies Reviewed: Additional studies/ records that were reviewed today include: . Review of the above records demonstrates:    ASSESSMENT AND PLAN:  1. Chest pain: Fairly atypical.  I do not think this is cardiac.  2.   Hypertensive emergency:   Her blood pressure has been well controlled.    3.  Chronic combined systolic and diastolic congestive heart failure: Original echocardiogram performed March, 2018 showed ejection fraction of 45 to 50%.  She has grade 2 diastolic dysfunction.  Will repeat her echo    3.  Pulmonary hypertension:  -  Wears her CPAP mask  4. Morbid obesity:    Participating in the weight loss program at [employer] .  She really is doing well today  I've encouraged her to continue   Current medicines are reviewed at length with the patient today.  The patient does not have concerns regarding medicines.  Labs/ tests ordered today include:   Orders Placed This Encounter  Procedures  . ECHOCARDIOGRAM COMPLETE    Disposition:   FU with me in 6 months     Amber Miss, MD  08/25/2018 6:47 PM    [facility] [address] [place], [place], [place]  [address] Phone: [phone]; Fax: [phone]

## 2018-08-25 NOTE — Patient Instructions (Signed)
Medication Instructions:  Your physician recommends that you continue on your current medications as directed. Please refer to the Current Medication list given to you today.   Labwork: None Ordered   Testing/Procedures: Your physician has requested that you have an echocardiogram. Echocardiography is a painless test that uses sound waves to create images of your heart. It provides your doctor with information about the size and shape of your heart and how well your heart's chambers and valves are working. This procedure takes approximately one hour. There are no restrictions for this procedure.   Follow-Up Your physician wants you to follow-up in: 6 months with Dr. Nahser.  You will receive a reminder letter in the mail two months in advance. If you don't receive a letter, please call our office to schedule the follow-up appointment.   If you need a refill on your cardiac medications before your next appointment, please call your pharmacy.   Thank you for choosing [facility]! Michelle Swinyer, RN [phone]    

## 2018-08-29 ENCOUNTER — Other Ambulatory Visit: Payer: Self-pay

## 2018-08-29 ENCOUNTER — Ambulatory Visit (HOSPITAL_COMMUNITY): Payer: 59 | Attending: Cardiology

## 2018-08-29 DIAGNOSIS — G4733 Obstructive sleep apnea (adult) (pediatric): Secondary | ICD-10-CM | POA: Diagnosis not present

## 2018-08-29 DIAGNOSIS — I5032 Chronic diastolic (congestive) heart failure: Secondary | ICD-10-CM | POA: Diagnosis not present

## 2018-08-29 DIAGNOSIS — I371 Nonrheumatic pulmonary valve insufficiency: Secondary | ICD-10-CM | POA: Diagnosis not present

## 2018-08-29 DIAGNOSIS — I272 Pulmonary hypertension, unspecified: Secondary | ICD-10-CM | POA: Diagnosis not present

## 2018-08-29 DIAGNOSIS — I159 Secondary hypertension, unspecified: Secondary | ICD-10-CM | POA: Diagnosis not present

## 2018-08-29 DIAGNOSIS — I5022 Chronic systolic (congestive) heart failure: Secondary | ICD-10-CM | POA: Diagnosis not present

## 2018-08-29 DIAGNOSIS — R079 Chest pain, unspecified: Secondary | ICD-10-CM | POA: Insufficient documentation

## 2018-08-29 DIAGNOSIS — I110 Hypertensive heart disease with heart failure: Secondary | ICD-10-CM | POA: Diagnosis not present

## 2018-08-29 DIAGNOSIS — I5041 Acute combined systolic (congestive) and diastolic (congestive) heart failure: Secondary | ICD-10-CM | POA: Diagnosis not present

## 2018-08-29 DIAGNOSIS — I071 Rheumatic tricuspid insufficiency: Secondary | ICD-10-CM | POA: Diagnosis not present

## 2018-08-29 DIAGNOSIS — E785 Hyperlipidemia, unspecified: Secondary | ICD-10-CM | POA: Insufficient documentation

## 2018-09-02 MED FILL — VIT D2 1.25 MG (50,000 UNIT: 1.25 MG | 28 days supply | Qty: 4 | Fill #0

## 2018-09-03 ENCOUNTER — Ambulatory Visit (INDEPENDENT_AMBULATORY_CARE_PROVIDER_SITE_OTHER): Payer: 59 | Admitting: Family Medicine

## 2018-09-03 VITALS — BP 132/79 | HR 64 | Temp 97.5°F | Ht 67.0 in | Wt 299.0 lb

## 2018-09-03 DIAGNOSIS — I10 Essential (primary) hypertension: Secondary | ICD-10-CM | POA: Diagnosis not present

## 2018-09-03 DIAGNOSIS — R7303 Prediabetes: Secondary | ICD-10-CM | POA: Diagnosis not present

## 2018-09-03 DIAGNOSIS — I5032 Chronic diastolic (congestive) heart failure: Secondary | ICD-10-CM | POA: Diagnosis not present

## 2018-09-03 DIAGNOSIS — Z6841 Body Mass Index (BMI) 40.0 and over, adult: Secondary | ICD-10-CM

## 2018-09-03 DIAGNOSIS — Z9189 Other specified personal risk factors, not elsewhere classified: Secondary | ICD-10-CM | POA: Diagnosis not present

## 2018-09-03 MED ORDER — METFORMIN HCL 500 MG PO TABS: ORAL_TABLET | ORAL | 0 refills | Status: DC

## 2018-09-03 MED FILL — metFORMIN HCL 500 MG TABS: 500 | 30 days supply | Qty: 30 | Fill #0

## 2018-09-04 NOTE — Progress Notes (Signed)
Office: [phone]  /  Fax: [phone]   HPI:   Chief Complaint: OBESITY Amber Bentley is here to discuss her progress with her obesity treatment plan. She is keeping a food journal with 1600 calories and 100+ grams of protein and is following her eating plan approximately 100 % of the time. She states she is walking 150 minutes per week. Amber Bentley is doing better with journaling. She is doing well during the day, but it is getting trickier to journal in the evening secondary to home cooking.  Her weight is 299 lb (135.6 kg) today and has not lost weight since her last visit. She has lost 52 lbs since starting treatment with [name]. She thinks that she is hitting her protein most of the time.  Congestive Heart Failure Amber Bentley saw cardiology last week. Her blood pressure is controlled today.  Pre-Diabetes Amber Bentley has a diagnosis of pre-diabetes based on her elevated Hgb A1c and was informed that this puts her at greater risk of developing diabetes. She does have occasional cravings. She has been eating cotton candy and grapes for sweets.  She is taking metformin currently and continues to work on diet and exercise to decrease risk of diabetes.   At risk for diabetes Amber Bentley is at higher than average risk for developing diabetes due to her pre-diabetes and obesity.   ALLERGIES: Allergies  Allergen Reactions  . 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   [name] [Naproxen] Other (See Comments)    Pt states that this medication makes her loopy.     MEDICATIONS: Current Outpatient Medications on File Prior to Visit  Medication Sig Dispense Refill  . carvedilol (COREG) 6.25 MG tablet Take 1 tablet (6.25 mg total) by mouth 2 (two) times daily. 180 tablet 3  . lisinopril (PRINIVIL,ZESTRIL) 20 MG tablet TAKE 1 TABLET BY MOUTH ONCE DAILY 90 tablet 1  . pantoprazole (PROTONIX) 40 MG tablet TAKE 1 TABLET BY MOUTH TWICE DAILY 180 tablet 1  . rosuvastatin (CRESTOR) 10 MG tablet  TAKE 1 TABLET BY MOUTH DAILY. 90 tablet 3  . spironolactone (ALDACTONE) 25 MG tablet Take 25 mg by mouth daily.    . Vitamin D, Ergocalciferol, (DRISDOL) 50000 units CAPS capsule Take 1 capsule (50,000 Units total) by mouth every 7 (seven) days. 4 capsule 0   No current facility-administered medications on file prior to visit.     PAST MEDICAL HISTORY: Past Medical History:  Diagnosis Date  . Anemia   . Anxiety   . Cardiomegaly   . Chest pain   . CHF (congestive heart failure) (HCC)   . Constipation   . Depression   . Dyspnea   . Fatigue   . Food allergy    celery  . GERD (gastroesophageal reflux disease)   . Hip pain   . HLD (hyperlipidemia)   . HTN (hypertension)   . Infertility, female   . Lactose intolerance   . Leg edema   . Lower back pain   . OSA on CPAP   . Palpitations   . Prediabetes   . Shoulder pain   . Stomach ulcer   . Varicose vein of leg     PAST SURGICAL HISTORY: Past Surgical History:  Procedure Laterality Date  . ABDOMINAL HYSTERECTOMY    . ENDOMETRIAL ABLATION  2007   prior to hysterectomy    SOCIAL HISTORY: Social History   Tobacco Use  . Smoking status: Former Smoker    Packs/day: 0.25  Years: 10.00    Pack years: 2.50    Types: Cigarettes  . Smokeless tobacco: Never Used  Substance Use Topics  . Alcohol use: Yes    Comment: rarely  . Drug use: No    FAMILY HISTORY: Family History  Problem Relation Age of Onset  . Hypertension Mother   . Cancer Mother        breast cancer  . Kidney disease Mother   . Depression Mother   . Obesity Mother   . Diabetes Father   . Heart disease Father 59  . Hyperlipidemia Father   . Hypertension Father   . Depression Father   . Anxiety disorder Father   . Sleep apnea Father   . Obesity Father   . Thyroid disease Sister   . Sudden death Brother   . Cancer Maternal Aunt        breast cancer  . Cancer Paternal Grandfather        brain cancer    ROS: Review of Systems    Constitutional: Negative for weight loss.  Endo/Heme/Allergies:       Positive for cravings.    PHYSICAL EXAM: Blood pressure 132/79, pulse 64, temperature (!) 97.5 F (36.4 C), temperature source Oral, height 5\' 7"  (1.702 m), weight 299 lb (135.6 kg), SpO2 95 %. Body mass index is 46.83 kg/m. Physical Exam  Constitutional: She is oriented to person, place, and time. She appears well-developed and well-nourished.  Cardiovascular: Normal rate.  Pulmonary/Chest: Effort normal.  Musculoskeletal: Normal range of motion.  Neurological: She is oriented to person, place, and time.  Skin: Skin is warm and dry.  Psychiatric: She has a normal mood and affect. Her behavior is normal.  Vitals reviewed.   RECENT LABS AND TESTS: BMET    Component Value Date/Time   NA 141 12/30/2017 1147   K 4.9 12/30/2017 1147   CL 102 12/30/2017 1147   CO2 23 12/30/2017 1147   GLUCOSE 108 (H) 12/30/2017 1147   GLUCOSE 113 (H) 06/20/2017 0856   BUN 17 12/30/2017 1147   CREATININE 0.87 12/30/2017 1147   CALCIUM 9.3 12/30/2017 1147   GFRNONAA 84 12/30/2017 1147   GFRAA 97 12/30/2017 1147   Lab Results  Component Value Date   HGBA1C 5.4 07/07/2018   HGBA1C 6.0 (H) 12/30/2017   HGBA1C 5.8 02/21/2017   Lab Results  Component Value Date   INSULIN 18.0 07/07/2018   INSULIN 89.7 (H) 12/30/2017   CBC    Component Value Date/Time   WBC 9.3 12/30/2017 1147   WBC 9.6 06/20/2017 0856   RBC 5.18 12/30/2017 1147   RBC 5.04 06/20/2017 0856   HGB 13.4 12/30/2017 1147   HCT 43.1 12/30/2017 1147   PLT 224.0 06/20/2017 0856   MCV 83 12/30/2017 1147   MCH 25.9 (L) 12/30/2017 1147   MCH 24.1 (L) 04/27/2017 2055   MCHC 31.1 (L) 12/30/2017 1147   MCHC 32.7 06/20/2017 0856   RDW 13.6 12/30/2017 1147   LYMPHSABS 2.0 12/30/2017 1147   MONOABS 0.6 02/21/2017 1510   EOSABS 0.2 12/30/2017 1147   BASOSABS 0.0 12/30/2017 1147   Iron/TIBC/Ferritin/ %Sat    Component Value Date/Time   IRON 85 06/20/2017  0856   TIBC 344 06/20/2017 0856   FERRITIN 55.4 06/20/2017 0856   IRONPCTSAT 25 06/20/2017 0856   Lipid Panel     Component Value Date/Time   CHOL 120 12/30/2017 1147   TRIG 65 12/30/2017 1147   HDL 45 12/30/2017 1147  CHOLHDL 3.3 04/08/2017 1124   CHOLHDL 4.7 02/22/2017 0420   VLDL 11 02/22/2017 0420   LDLCALC 62 12/30/2017 1147   Hepatic Function Panel     Component Value Date/Time   PROT 7.3 12/30/2017 1147   ALBUMIN 4.5 12/30/2017 1147   AST 24 12/30/2017 1147   ALT 28 12/30/2017 1147   ALKPHOS 70 12/30/2017 1147   BILITOT 0.3 12/30/2017 1147   BILIDIR 0.4 04/14/2017 0315   IBILI 0.7 04/14/2017 0315      Component Value Date/Time   TSH 1.270 12/30/2017 1147   TSH 3.01 06/20/2017 0856   TSH 4.78 (H) 02/21/2017 1510   Results for MINAH, AXELROD (MRN [id number]) as of 09/04/2018 17:52  Ref. Range 07/07/2018 15:38  Vitamin D, 25-Hydroxy Latest Ref Range: 30.0 - 100.0 ng/mL 35.5   ASSESSMENT AND PLAN: Chronic diastolic CHF (congestive heart failure) (HCC)  Essential hypertension  Prediabetes - Plan: metFORMIN (GLUCOPHAGE) 500 MG tablet  At risk for diabetes mellitus  Class 3 severe obesity with serious comorbidity and body mass index (BMI) of 45.0 to 49.9 in adult, unspecified obesity type (HCC)  PLAN:  Congestive Heart Failure Amber Bentley agreed to continue with her current medications.  Pre-Diabetes Amber Bentley will continue to work on weight loss, exercise, and decreasing simple carbohydrates in her diet to help decrease the risk of diabetes. She was informed that eating too many simple carbohydrates or too many calories at one sitting increases the likelihood of GI side effects. Amber Bentley to continue metformin 500 mg PO qAM #30 with no refills and a prescription was written today. Amber Bentley agreed to follow up with [name] as directed to monitor her progress in 3 weeks.  Diabetes risk counseling Amber Bentley was given extended (15 minutes) diabetes prevention counseling today. She is  42 y.o. femaleand has risk factors for diabetes including pre-diabetes and obesity. We discussed intensive lifestyle modifications today with an emphasis on weight loss as well as increasing exercise and decreasing simple carbohydrates in her diet.  Obesity Amber Bentley is currently in the action stage of change. As such, her goal is to continue with weight loss efforts. She has agreed to keep a food journal with 1600 calories and 100+ grams protein daily. Amber Bentley has been instructed to work up to a goal of 150 minutes of combined cardio and strengthening exercise per week for weight loss and overall health benefits. We discussed the following Behavioral Modification Strategies today: increasing lean protein intake, increasing vegetables, and work on meal planning and easy cooking plans, and planning for success.  Amber Bentley has agreed to follow up with our clinic in 3 weeks. She was informed of the importance of frequent follow up visits to maximize her success with intensive lifestyle modifications for her multiple health conditions.   OBESITY BEHAVIORAL INTERVENTION VISIT  Today's visit was # 15  Starting weight: 351 lbs Starting date: 12/30/17 Today's weight : Weight: 299 lb (135.6 kg)  Today's date: 09/03/2018 Total lbs lost to date: 20  ASK: We discussed the diagnosis of obesity with Amber Bentley today and Amber Bentley agreed to give [name] permission to discuss obesity behavioral modification therapy today.  ASSESS: Amber Bentley has the diagnosis of obesity and her BMI today is 46.82. Amber Bentley is in the action stage of change.   ADVISE: Amber Bentley was educated on the multiple health risks of obesity as well as the benefit of weight loss to improve her health. She was advised of the need for long term treatment and the importance of lifestyle modifications to improve her  current health and to decrease her risk of future health problems.  AGREE: Multiple dietary modification options and treatment options were discussed  and Amber Bentley agreed to follow the recommendations documented in the above note.  ARRANGE: Amber Bentley was educated on the importance of frequent visits to treat obesity as outlined per CMS and USPSTF guidelines and agreed to schedule her next follow up appointment today.  I, Kirke Corin, am acting as Energy manager for Filbert Schilder, MD  I have reviewed the above documentation for accuracy and completeness, and I agree with the above. - Debbra Riding, MD

## 2018-09-16 ENCOUNTER — Encounter: Payer: Self-pay | Admitting: Nurse Practitioner

## 2018-09-16 ENCOUNTER — Encounter (INDEPENDENT_AMBULATORY_CARE_PROVIDER_SITE_OTHER): Payer: Self-pay | Admitting: Family Medicine

## 2018-09-16 ENCOUNTER — Ambulatory Visit (INDEPENDENT_AMBULATORY_CARE_PROVIDER_SITE_OTHER): Payer: 59 | Admitting: Nurse Practitioner

## 2018-09-16 VITALS — BP 118/80 | HR 66 | Temp 98.1°F | Ht 67.0 in | Wt 307.0 lb

## 2018-09-16 DIAGNOSIS — N644 Mastodynia: Secondary | ICD-10-CM

## 2018-09-16 DIAGNOSIS — Z0001 Encounter for general adult medical examination with abnormal findings: Secondary | ICD-10-CM | POA: Diagnosis not present

## 2018-09-16 DIAGNOSIS — I10 Essential (primary) hypertension: Secondary | ICD-10-CM

## 2018-09-16 DIAGNOSIS — Z23 Encounter for immunization: Secondary | ICD-10-CM | POA: Diagnosis not present

## 2018-09-16 DIAGNOSIS — Z1231 Encounter for screening mammogram for malignant neoplasm of breast: Secondary | ICD-10-CM

## 2018-09-16 MED ORDER — SPIRONOLACTONE 25 MG PO TABS: 25.0000 mg | ORAL_TABLET | Freq: Every day | ORAL | 0 refills | Status: DC

## 2018-09-16 MED FILL — SPIRONOLACTONE 25 MG TABS: 25 | 30 days supply | Qty: 30 | Fill #0

## 2018-09-16 NOTE — Patient Instructions (Addendum)
May use tylenol or ibuprofen for breast pain.  Schedule mammogram on or after 10/24/2018.  Check with cardiology about continuation of spironolactone.  Breast Tenderness Breast tenderness is a common problem for women of all ages. Breast tenderness may cause mild discomfort to severe pain. The pain usually comes and goes in association with your menstrual cycle, but it can be constant. Breast tenderness has many possible causes, including hormone changes and some medicines. Your health care provider may order tests, such as a mammogram or an ultrasound, to check for any unusual findings. Having breast tenderness usually does not mean that you have breast cancer. Follow these instructions at home: Sometimes, reassurance that you do not have breast cancer is all that is needed. In general, follow these home care instructions: Managing pain and discomfort  If directed, apply ice to the area: ? Put ice in a plastic bag. ? Place a towel between your skin and the bag. ? Leave the ice on for 20 minutes, 2-3 times a day.  Make sure you are wearing a supportive bra, especially during exercise. You may also want to wear a supportive bra while sleeping if your breasts are very tender. Medicines  Take over-the-counter and prescription medicines only as told by your health care provider. If the cause of your pain is infection, you may be prescribed an antibiotic medicine.  If you were prescribed an antibiotic, take it as told by your health care provider. Do not stop taking the antibiotic even if you start to feel better. General instructions  Your health care provider may recommend that you reduce the amount of fat in your diet. You can do this by: ? Limiting fried foods. ? Cooking foods using methods, such as baking, boiling, grilling, and broiling.  Decrease the amount of caffeine in your diet. You can do this by drinking more water and choosing caffeine-free options.  Keep a log of the days and  times when your breasts are most tender.  Ask your health care provider how to do breast exams at home. This will help you notice if you have an unusual growth or lump. Contact a health care provider if:  Any part of your breast is hard, red, and hot to the touch. This may be a sign of infection.  You are not breastfeeding and you have fluid, especially blood or pus, coming out of your nipples.  You have a fever.  You have a new or painful lump in your breast that remains after your menstrual period ends.  Your pain does not improve or it gets worse.  Your pain is interfering with your daily activities. This information is not intended to replace advice given to you by your health care provider. Make sure you discuss any questions you have with your health care provider. Document Released: 11/15/2008 Document Revised: 08/31/2016 Document Reviewed: 08/31/2016 Elsevier Interactive Patient Education  [employer].

## 2018-09-16 NOTE — Progress Notes (Signed)
Subjective:    Patient ID: Amber Bentley, female    DOB: Jan 08, 1976, 42 y.o.   MRN: [id number]  Patient presents today for complete physical   HPI   She also complains of bilateral breast pain, chronic but worse in last 3weeks, pain is intermittent, no known trigger, no nipple discharge, no change in skin. she is concerned about breast cancer due to FHx of breast cancer(mother and grandmother). Drinks 1cup of coffee per day. No other caffeinated drinks.  S/p hysterectomy in 30s due to menorrhagia. Normal pathology per patient. No vaginal  Symptoms, no FHx of cervical cancer  Sexual History (orientation,birth control, marital status, STD):sexually active, female partner  Depression/Suicide: Depression screen [facility] 2/9 09/16/2018 12/30/2017 05/14/2017 02/21/2017  Decreased Interest 0 3 0 0  Down, Depressed, Hopeless 0 3 0 0  PHQ - 2 Score 0 6 0 0  Altered sleeping - 3 - -  Tired, decreased energy - 3 - -  Change in appetite - 3 - -  Feeling bad or failure about yourself  - 3 - -  Trouble concentrating - 3 - -  Moving slowly or fidgety/restless - 3 - -  Suicidal thoughts - 3 - -  PHQ-9 Score - 27 - -  Difficult doing work/chores - Somewhat difficult - -   Vision:up to date  Dental:not interested in scheduling  Immunizations: (TDAP, Hep C screen, Pneumovax, Influenza, zoster)  Health Maintenance  Topic Date Due  . Flu Shot  07/17/2018  . Tetanus Vaccine  09/16/2024  . HIV Screening  Completed  . Pap Smear  Discontinued   Diet:heart healthy, small portions, calorie count Weight: seen by weight loss clinic monthly Wt Readings from Last 3 Encounters:  09/16/18 (!) 307 lb (139.3 kg)  09/03/18 299 lb (135.6 kg)  08/25/18 (!) 302 lb 12.8 oz (137.3 kg)   Exercise:walking and swimming daily  Fall Risk: Fall Risk  09/16/2018 05/14/2017 02/21/2017  Falls in the past year? No No No   Home Safety:home with children and female partner  Advanced Directive: Advanced Directives 07/19/2017    Does Patient Have a Medical Advance Directive? No  Type of Advance Directive -  Does patient want to make changes to medical advance directive? -  Copy of Healthcare Power of Attorney in Chart? -  Would patient like information on creating a medical advance directive? No - Patient declined     Medications and allergies reviewed with patient and updated if appropriate.  Patient Active Problem List   Diagnosis Date Noted  . Vitamin D deficiency 01/14/2018  . Prediabetes 01/14/2018  . Essential hypertension 07/30/2017  . OSA (obstructive sleep apnea) 07/20/2017  . Nasal sore 07/18/2017  . Chronic diastolic CHF (congestive heart failure) (HCC) 03/01/2017  . Pulmonary hypertension (HCC) 03/01/2017  . Morbid obesity (HCC)   . SOB (shortness of breath)   . Migraine without status migrainosus, not intractable   . Altered mental state   . Hypertensive cardiomyopathy, 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HCC)   . Congestive heart failure (HCC)   . Pulmonary edema   . 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 HTN (hypertension), benign 02/21/2017  . Adjustment disorder with mixed anxiety and depressed mood 02/21/2017  . 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 Hypertensive emergency 02/21/2017  . Tobacco abuse 02/21/2017  . Chest pain 02/21/2017  . Anxiety and depression 02/21/2017    Current Outpatient Medications on File Prior to Visit  Medication Sig Dispense Refill  . carvedilol (  COREG) 6.25 MG tablet Take 1 tablet (6.25 mg total) by mouth 2 (two) times daily. 180 tablet 3  . lisinopril (PRINIVIL,ZESTRIL) 20 MG tablet TAKE 1 TABLET BY MOUTH ONCE DAILY 90 tablet 1  . metFORMIN (GLUCOPHAGE) 500 MG tablet TAKE 1 TABLET BY MOUTH ONCE DAILY WITH BREAKFAST 30 tablet 0  . pantoprazole (PROTONIX) 40 MG tablet TAKE 1 TABLET BY MOUTH TWICE DAILY 180 tablet 1  . rosuvastatin (CRESTOR) 10 MG tablet TAKE 1 TABLET BY MOUTH DAILY. 90 tablet 3  . Vitamin D,  Ergocalciferol, (DRISDOL) 50000 units CAPS capsule Take 1 capsule (50,000 Units total) by mouth every 7 (seven) days. 4 capsule 0   No current facility-administered medications on file prior to visit.     Past Medical History:  Diagnosis Date  . Anemia   . Anxiety   . Cardiomegaly   . Chest pain   . CHF (congestive heart failure) (HCC)   . Constipation   . Depression   . Dyspnea   . Fatigue   . Food allergy    celery  . GERD (gastroesophageal reflux disease)   . Hip pain   . HLD (hyperlipidemia)   . HTN (hypertension)   . Infertility, female   . Lactose intolerance   . Leg edema   . Lower back pain   . OSA on CPAP   . Palpitations   . Prediabetes   . Shoulder pain   . Stomach ulcer   . Varicose vein of leg     Past Surgical History:  Procedure Laterality Date  . ABDOMINAL HYSTERECTOMY    . ENDOMETRIAL ABLATION  2007   prior to hysterectomy    Social History   Socioeconomic History  . Marital status: Married    Spouse name: Victorino Dike  . Number of children: 2  . Years of education: MA  . Highest education level: Not on file  Occupational History  . Occupation: [employer]  Social Needs  . Financial resource strain: Not on file  . Food insecurity:    Worry: Not on file    Inability: Not on file  . Transportation needs:    Medical: Not on file    Non-medical: Not on file  Tobacco Use  . Smoking status: Former Smoker    Packs/day: 0.25    Years: 10.00    Pack years: 2.50    Types: Cigarettes  . Smokeless tobacco: Never Used  Substance and Sexual Activity  . Alcohol use: Yes    Comment: rarely  . Drug use: No  . Sexual activity: Not on file  Lifestyle  . Physical activity:    Days per week: Not on file    Minutes per session: Not on file  . Stress: Not on file  Relationships  . Social connections:    Talks on phone: Not on file    Gets together: Not on file    Attends religious service: Not on file    Active member of club or  organization: Not on file    Attends meetings of clubs or organizations: Not on file    Relationship status: Not on file  Other Topics Concern  . Not on file  Social History Narrative   Drinks coffee daily     Family History  Problem Relation Age of Onset  . Hypertension Mother   . Cancer Mother        breast cancer  . Kidney disease Mother   . Depression Mother   .  Obesity Mother   . Diabetes Father   . Heart disease Father 16  . Hyperlipidemia Father   . Hypertension Father   . Depression Father   . Anxiety disorder Father   . Sleep apnea Father   . Obesity Father   . Thyroid disease Sister   . Sudden death Brother   . Cancer Maternal Aunt        breast cancer  . Cancer Paternal Grandfather        brain cancer  . Cancer Maternal Grandmother        breast        Review of Systems  Constitutional: Negative for fever, malaise/fatigue and weight loss.  HENT: Negative for congestion and sore throat.   Eyes:       Negative for visual changes  Respiratory: Negative for cough and shortness of breath.   Cardiovascular: Negative for chest pain, palpitations and leg swelling.       Bilateral breast pain  Gastrointestinal: Negative for blood in stool, constipation, diarrhea and heartburn.  Genitourinary: Negative for dysuria, frequency and urgency.  Musculoskeletal: Negative for falls, joint pain and myalgias.  Skin: Negative for rash.  Neurological: Negative for dizziness, sensory change and headaches.  Endo/Heme/Allergies: Does not bruise/bleed easily.  Psychiatric/Behavioral: Negative for depression, substance abuse and suicidal ideas. The patient is not nervous/anxious.     Objective:   Vitals:   09/16/18 1433  BP: 118/80  Pulse: 66  Temp: 98.1 F (36.7 C)  SpO2: 98%    Body mass index is 48.08 kg/m.   Physical Examination:  Physical Exam  Constitutional: She is oriented to person, place, and time. She appears well-developed and well-nourished.    Cardiovascular: Normal rate, regular rhythm, normal heart sounds and intact distal pulses.  Pulmonary/Chest: Effort normal and breath sounds normal. She exhibits no tenderness and no crepitus. Right breast exhibits tenderness. Right breast exhibits no inverted nipple, no mass, no nipple discharge and no skin change. Left breast exhibits tenderness. Left breast exhibits no inverted nipple, no mass, no nipple discharge and no skin change. No breast swelling. Breasts are symmetrical.  Bilateral lateral breast tenderness  Abdominal: Soft. Bowel sounds are normal.  Genitourinary: No breast swelling.  Genitourinary Comments: declined  Musculoskeletal: Normal range of motion.  Neurological: She is alert and oriented to person, place, and time.  Psychiatric: She has a normal mood and affect. Her behavior is normal. Thought content normal.  Vitals reviewed.   ASSESSMENT and PLAN:  Amber Bentley was seen today for annual exam.  Diagnoses and all orders for this visit:  Encounter for preventative adult health care examination  Pain of both breasts  Breast cancer screening by mammogram -     MM 3D SCREEN BREAST BILATERAL; Future  Need for influenza vaccination -     Flu Vaccine QUAD 36+ mos IM  Essential hypertension -     spironolactone (ALDACTONE) 25 MG tablet; Take 1 tablet (25 mg total) by mouth daily.   No problem-specific Assessment & Plan notes found for this encounter.     Follow up: Return if symptoms worsen or fail to improve.  Alysia Penna, NP

## 2018-09-17 ENCOUNTER — Encounter: Payer: Self-pay | Admitting: Nurse Practitioner

## 2018-09-24 ENCOUNTER — Ambulatory Visit (INDEPENDENT_AMBULATORY_CARE_PROVIDER_SITE_OTHER): Payer: 59 | Admitting: Family Medicine

## 2018-09-24 VITALS — BP 137/82 | HR 58 | Temp 97.9°F | Ht 67.0 in | Wt 308.0 lb

## 2018-09-24 DIAGNOSIS — Z6841 Body Mass Index (BMI) 40.0 and over, adult: Secondary | ICD-10-CM

## 2018-09-24 DIAGNOSIS — Z9189 Other specified personal risk factors, not elsewhere classified: Secondary | ICD-10-CM

## 2018-09-24 DIAGNOSIS — E559 Vitamin D deficiency, unspecified: Secondary | ICD-10-CM | POA: Diagnosis not present

## 2018-09-24 DIAGNOSIS — I5032 Chronic diastolic (congestive) heart failure: Secondary | ICD-10-CM | POA: Diagnosis not present

## 2018-09-24 MED ORDER — VITAMIN D (ERGOCALCIFEROL) 1.25 MG (50000 UNIT) PO CAPS
50000.0000 [IU] | ORAL_CAPSULE | ORAL | 0 refills | Status: DC
Start: 2018-09-24 — End: 2018-11-10

## 2018-09-28 DIAGNOSIS — I159 Secondary hypertension, unspecified: Secondary | ICD-10-CM | POA: Diagnosis not present

## 2018-09-28 DIAGNOSIS — I5032 Chronic diastolic (congestive) heart failure: Secondary | ICD-10-CM | POA: Diagnosis not present

## 2018-09-28 DIAGNOSIS — I5041 Acute combined systolic (congestive) and diastolic (congestive) heart failure: Secondary | ICD-10-CM | POA: Diagnosis not present

## 2018-09-29 NOTE — Progress Notes (Signed)
Office: [phone]  /  Fax: [phone]   HPI:   Chief Complaint: OBESITY Amber Bentley is here to discuss her progress with her obesity treatment plan. She is on the keep a food journal with 1600 calories and 100+ grams of protein daily and is following her eating plan approximately 50 % of the time. She states she is walking for 30 minutes 4-5 times per week. Amber Bentley has plans to camp for 3-4 weekends. She has noticed increase in eating our recently for numerous celebrations with friends.  Her weight is (!) 308 lb (139.7 kg) today and has gained 9 pounds since her last visit. She has lost 43 lbs since starting treatment with [name].  Vitamin D Deficiency Amber Bentley has a diagnosis of vitamin D deficiency. She is currently taking prescription Vit D. She notes fatigue and denies nausea, vomiting or muscle weakness.  At risk for osteopenia and osteoporosis Amber Bentley is at higher risk of osteopenia and osteoporosis due to vitamin D deficiency.   Congestive Heart Failure Amber Bentley has congestive heart failure. She ran out of spironolactone recently and noticed increase in edema of ankles.  ALLERGIES: Allergies  Allergen Reactions  . 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   Amber Bentley [Naproxen] Other (See Comments)    Pt states that this medication makes her loopy.     MEDICATIONS: Current Outpatient Medications on File Prior to Visit  Medication Sig Dispense Refill  . carvedilol (COREG) 6.25 MG tablet Take 1 tablet (6.25 mg total) by mouth 2 (two) times daily. 180 tablet 3  . lisinopril (PRINIVIL,ZESTRIL) 20 MG tablet TAKE 1 TABLET BY MOUTH ONCE DAILY 90 tablet 1  . metFORMIN (GLUCOPHAGE) 500 MG tablet TAKE 1 TABLET BY MOUTH ONCE DAILY WITH BREAKFAST 30 tablet 0  . pantoprazole (PROTONIX) 40 MG tablet TAKE 1 TABLET BY MOUTH TWICE DAILY 180 tablet 1  . rosuvastatin (CRESTOR) 10 MG tablet TAKE 1 TABLET BY MOUTH DAILY. 90 tablet 3  . spironolactone (ALDACTONE) 25 MG tablet  Take 1 tablet (25 mg total) by mouth daily. 30 tablet 0   No current facility-administered medications on file prior to visit.     PAST MEDICAL HISTORY: Past Medical History:  Diagnosis Date  . Anemia   . Anxiety   . Cardiomegaly   . Chest pain   . CHF (congestive heart failure) (HCC)   . Constipation   . Depression   . Dyspnea   . Fatigue   . Food allergy    celery  . GERD (gastroesophageal reflux disease)   . Hip pain   . HLD (hyperlipidemia)   . HTN (hypertension)   . Infertility, female   . Lactose intolerance   . Leg edema   . Lower back pain   . OSA on CPAP   . Palpitations   . Prediabetes   . Shoulder pain   . Stomach ulcer   . Varicose vein of leg     PAST SURGICAL HISTORY: Past Surgical History:  Procedure Laterality Date  . ABDOMINAL HYSTERECTOMY    . ENDOMETRIAL ABLATION  2007   prior to hysterectomy    SOCIAL HISTORY: Social History   Tobacco Use  . Smoking status: Former Smoker    Packs/day: 0.25    Years: 10.00    Pack years: 2.50    Types: Cigarettes  . Smokeless tobacco: Never Used  Substance Use Topics  . Alcohol use: Yes    Comment: rarely  . Drug  use: No    FAMILY HISTORY: Family History  Problem Relation Age of Onset  . Hypertension Mother   . Cancer Mother        breast cancer  . Kidney disease Mother   . Depression Mother   . Obesity Mother   . Diabetes Father   . Heart disease Father 62  . Hyperlipidemia Father   . Hypertension Father   . Depression Father   . Anxiety disorder Father   . Sleep apnea Father   . Obesity Father   . Thyroid disease Sister   . Sudden death Brother   . Cancer Maternal Aunt        breast cancer  . Cancer Paternal Grandfather        brain cancer  . Cancer Maternal Grandmother        breast    ROS: Review of Systems  Constitutional: Positive for malaise/fatigue. Negative for weight loss.  Gastrointestinal: Negative for nausea and vomiting.  Musculoskeletal:       Negative muscle  weakness    PHYSICAL EXAM: Blood pressure 137/82, pulse (!) 58, temperature 97.9 F (36.6 C), temperature source Oral, height 5\' 7"  (1.702 m), weight (!) 308 lb (139.7 kg), SpO2 97 %. Body mass index is 48.24 kg/m. Physical Exam  Constitutional: She is oriented to person, place, and time. She appears well-developed and well-nourished.  Cardiovascular: Normal rate.  Pulmonary/Chest: Effort normal.  Musculoskeletal: Normal range of motion.  Neurological: She is oriented to person, place, and time.  Skin: Skin is warm and dry.  Psychiatric: She has a normal mood and affect. Her behavior is normal.  Vitals reviewed.   RECENT LABS AND TESTS: BMET    Component Value Date/Time   NA 141 12/30/2017 1147   K 4.9 12/30/2017 1147   CL 102 12/30/2017 1147   CO2 23 12/30/2017 1147   GLUCOSE 108 (H) 12/30/2017 1147   GLUCOSE 113 (H) 06/20/2017 0856   BUN 17 12/30/2017 1147   CREATININE 0.87 12/30/2017 1147   CALCIUM 9.3 12/30/2017 1147   GFRNONAA 84 12/30/2017 1147   GFRAA 97 12/30/2017 1147   Lab Results  Component Value Date   HGBA1C 5.4 07/07/2018   HGBA1C 6.0 (H) 12/30/2017   HGBA1C 5.8 02/21/2017   Lab Results  Component Value Date   INSULIN 18.0 07/07/2018   INSULIN 89.7 (H) 12/30/2017   CBC    Component Value Date/Time   WBC 9.3 12/30/2017 1147   WBC 9.6 06/20/2017 0856   RBC 5.18 12/30/2017 1147   RBC 5.04 06/20/2017 0856   HGB 13.4 12/30/2017 1147   HCT 43.1 12/30/2017 1147   PLT 224.0 06/20/2017 0856   MCV 83 12/30/2017 1147   MCH 25.9 (L) 12/30/2017 1147   MCH 24.1 (L) 04/27/2017 2055   MCHC 31.1 (L) 12/30/2017 1147   MCHC 32.7 06/20/2017 0856   RDW 13.6 12/30/2017 1147   LYMPHSABS 2.0 12/30/2017 1147   MONOABS 0.6 02/21/2017 1510   EOSABS 0.2 12/30/2017 1147   BASOSABS 0.0 12/30/2017 1147   Iron/TIBC/Ferritin/ %Sat    Component Value Date/Time   IRON 85 06/20/2017 0856   TIBC 344 06/20/2017 0856   FERRITIN 55.4 06/20/2017 0856   IRONPCTSAT 25  06/20/2017 0856   Lipid Panel     Component Value Date/Time   CHOL 120 12/30/2017 1147   TRIG 65 12/30/2017 1147   HDL 45 12/30/2017 1147   CHOLHDL 3.3 04/08/2017 1124   CHOLHDL 4.7 02/22/2017 0420   VLDL  11 02/22/2017 0420   LDLCALC 62 12/30/2017 1147   Hepatic Function Panel     Component Value Date/Time   PROT 7.3 12/30/2017 1147   ALBUMIN 4.5 12/30/2017 1147   AST 24 12/30/2017 1147   ALT 28 12/30/2017 1147   ALKPHOS 70 12/30/2017 1147   BILITOT 0.3 12/30/2017 1147   BILIDIR 0.4 04/14/2017 0315   IBILI 0.7 04/14/2017 0315      Component Value Date/Time   TSH 1.270 12/30/2017 1147   TSH 3.01 06/20/2017 0856   TSH 4.78 (H) 02/21/2017 1510   Results for INGRID, SHIFRIN (MRN [id number]) as of 09/29/2018 12:40  Ref. Range 07/07/2018 15:38  Vitamin D, 25-Hydroxy Latest Ref Range: 30.0 - 100.0 ng/mL 35.5   ASSESSMENT AND PLAN: Vitamin D deficiency - Plan: Vitamin D, Ergocalciferol, (DRISDOL) 50000 units CAPS capsule  Chronic diastolic CHF (congestive heart failure) (HCC)  At risk for osteoporosis  Class 3 severe obesity with serious comorbidity and body mass index (BMI) of 45.0 to 49.9 in adult, unspecified obesity type (HCC)  PLAN:  Vitamin D Deficiency Charidy was informed that low vitamin D levels contributes to fatigue and are associated with obesity, breast, and colon cancer. Taejah agrees to continue taking prescription Vit D @50 ,000 IU every week #4 and we will refill for 1 month. She will follow up for routine testing of vitamin D, at least 2-3 times per year. She was informed of the risk of over-replacement of vitamin D and agrees to not increase her dose unless she discusses this with [name] first. Logyn agrees to follow up with our clinic in 2 weeks.  At risk for osteopenia and osteoporosis Celicia was given extended (15 minutes) osteoporosis prevention counseling today. Dajanae is at risk for osteopenia and osteoporsis due to her vitamin D deficiency. She was encouraged to  take her vitamin D and follow her higher calcium diet and increase strengthening exercise to help strengthen her bones and decrease her risk of osteopenia and osteoporosis.  Congestive Heart Failure Nasirah is to follow up with Cardiology as needed and she agrees to follow up with our clinic in 2 weeks.  Obesity Rielynn is currently in the action stage of change. As such, her goal is to continue with weight loss efforts She has agreed to keep a food journal with 1600 calories and 100+ grams of protein daily Ladora has been instructed to work up to a goal of 150 minutes of combined cardio and strengthening exercise per week for weight loss and overall health benefits. We discussed the following Behavioral Modification Strategies today: increasing lean protein intake, increasing vegetables, work on meal planning and easy cooking plans, better snacking choices, and planning for success   Innocence has agreed to follow up with our clinic in 2 weeks. She was informed of the importance of frequent follow up visits to maximize her success with intensive lifestyle modifications for her multiple health conditions.   OBESITY BEHAVIORAL INTERVENTION VISIT  Today's visit was # 16   Starting weight: 351 lbs Starting date: 12/30/17 Today's weight : 308 lbs  Today's date: 09/24/2018 Total lbs lost to date: 44    ASK: We discussed the diagnosis of obesity with Beverly Sessions today and Tanice agreed to give [name] permission to discuss obesity behavioral modification therapy today.  ASSESS: Donesha has the diagnosis of obesity and her BMI today is 48.23 Naw is in the action stage of change   ADVISE: Belia was educated on the multiple health risks of obesity as well  as the benefit of weight loss to improve her health. She was advised of the need for long term treatment and the importance of lifestyle modifications to improve her current health and to decrease her risk of future health problems.  AGREE: Multiple  dietary modification options and treatment options were discussed and  Ronna agreed to follow the recommendations documented in the above note.  ARRANGE: Leatha was educated on the importance of frequent visits to treat obesity as outlined per CMS and USPSTF guidelines and agreed to schedule her next follow up appointment today.  I, Burt Knack, am acting as transcriptionist for Debbra Riding, MD  I have reviewed the above documentation for accuracy and completeness, and I agree with the above. - Debbra Riding, MD

## 2018-10-15 ENCOUNTER — Ambulatory Visit (INDEPENDENT_AMBULATORY_CARE_PROVIDER_SITE_OTHER): Payer: 59 | Admitting: Family Medicine

## 2018-10-15 VITALS — BP 115/73 | HR 63 | Temp 97.7°F | Ht 67.0 in | Wt 301.0 lb

## 2018-10-15 DIAGNOSIS — Z6841 Body Mass Index (BMI) 40.0 and over, adult: Secondary | ICD-10-CM | POA: Diagnosis not present

## 2018-10-15 DIAGNOSIS — R7303 Prediabetes: Secondary | ICD-10-CM

## 2018-10-15 DIAGNOSIS — E559 Vitamin D deficiency, unspecified: Secondary | ICD-10-CM | POA: Diagnosis not present

## 2018-10-15 DIAGNOSIS — Z9189 Other specified personal risk factors, not elsewhere classified: Secondary | ICD-10-CM

## 2018-10-15 MED ORDER — METFORMIN HCL 500 MG PO TABS: ORAL_TABLET | ORAL | 0 refills | Status: DC

## 2018-10-16 NOTE — Progress Notes (Signed)
Office: [phone]  /  Fax: [phone]   HPI:   Chief Complaint: OBESITY Amber Bentley is here to discuss her progress with her obesity treatment plan. She is keeping a food journal with 1600 calories and 100+ gm protein.  and is following her eating plan approximately 90 % of the time. She states she is exercising 0 minutes 0 times per week. Amber Bentley was significantly dedicated to walking but has not been walking routinely. Amber Bentley  Her weight is (!) 301 lb (136.5 kg) today and has had a weight loss of 7 pounds over a period of 3 weeks since her last visit. She has lost 50 lbs since starting treatment with [name].  Pre-Diabetes Amber Bentley has a diagnosis of prediabetes based on her elevated HgA1c and was informed this puts her at greater risk of developing diabetes. She is taking metformin currently and continues to work on diet and exercise to decrease risk of diabetes. Amber Bentley denies any carb cravings except fun size candy bars. She is currently taking Metformin and denies any GI upset.  She denies nausea or hypoglycemia.  At risk for diabetes Amber Bentley is at higher than averagerisk for developing diabetes due to her obesity. She currently denies polyuria or polydipsia.  Vitamin D deficiency Amber Bentley has a diagnosis of vitamin D deficiency. She is currently taking vit D but still complains of fatigue. Amber Bentley denies nausea, vomiting or muscle weakness.   ALLERGIES: Allergies  Allergen Reactions  . 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   Amber Bentley [Naproxen] Other (See Comments)    Pt states that this medication makes her loopy.     MEDICATIONS: Current Outpatient Medications on File Prior to Visit  Medication Sig Dispense Refill  . carvedilol (COREG) 6.25 MG tablet Take 1 tablet (6.25 mg total) by mouth 2 (two) times daily. 180 tablet 3  . lisinopril (PRINIVIL,ZESTRIL) 20 MG tablet TAKE 1 TABLET BY MOUTH ONCE DAILY 90 tablet 1  . pantoprazole (PROTONIX) 40 MG tablet TAKE 1  TABLET BY MOUTH TWICE DAILY 180 tablet 1  . rosuvastatin (CRESTOR) 10 MG tablet TAKE 1 TABLET BY MOUTH DAILY. 90 tablet 3  . spironolactone (ALDACTONE) 25 MG tablet Take 1 tablet (25 mg total) by mouth daily. 30 tablet 0  . Vitamin D, Ergocalciferol, (DRISDOL) 50000 units CAPS capsule Take 1 capsule (50,000 Units total) by mouth every 7 (seven) days. 4 capsule 0   No current facility-administered medications on file prior to visit.     PAST MEDICAL HISTORY: Past Medical History:  Diagnosis Date  . Anemia   . Anxiety   . Cardiomegaly   . Chest pain   . CHF (congestive heart failure) (HCC)   . Constipation   . Depression   . Dyspnea   . Fatigue   . Food allergy    celery  . GERD (gastroesophageal reflux disease)   . Hip pain   . HLD (hyperlipidemia)   . HTN (hypertension)   . Infertility, female   . Lactose intolerance   . Leg edema   . Lower back pain   . OSA on CPAP   . Palpitations   . Prediabetes   . Shoulder pain   . Stomach ulcer   . Varicose vein of leg     PAST SURGICAL HISTORY: Past Surgical History:  Procedure Laterality Date  . ABDOMINAL HYSTERECTOMY    . ENDOMETRIAL ABLATION  2007   prior to hysterectomy    SOCIAL HISTORY: Social  History   Tobacco Use  . Smoking status: Former Smoker    Packs/day: 0.25    Years: 10.00    Pack years: 2.50    Types: Cigarettes  . Smokeless tobacco: Never Used  Substance Use Topics  . Alcohol use: Yes    Comment: rarely  . Drug use: No    FAMILY HISTORY: Family History  Problem Relation Age of Onset  . Hypertension Mother   . Cancer Mother        breast cancer  . Kidney disease Mother   . Depression Mother   . Obesity Mother   . Diabetes Father   . Heart disease Father 72  . Hyperlipidemia Father   . Hypertension Father   . Depression Father   . Anxiety disorder Father   . Sleep apnea Father   . Obesity Father   . Thyroid disease Sister   . Sudden death Brother   . Cancer Maternal Aunt         breast cancer  . Cancer Paternal Grandfather        brain cancer  . Cancer Maternal Grandmother        breast    ROS: Review of Systems  Constitutional: Positive for malaise/fatigue and weight loss.  Gastrointestinal: Negative for diarrhea, nausea and vomiting.  Musculoskeletal:       Negative for muscle weakness  Endo/Heme/Allergies: Negative for polydipsia.       Negative for polyuria Negative for hypoglycemia    PHYSICAL EXAM: Blood pressure 115/73, pulse 63, temperature 97.7 F (36.5 C), temperature source Oral, height 5\' 7"  (1.702 m), weight (!) 301 lb (136.5 kg), SpO2 97 %. Body mass index is 47.14 kg/m. Physical Exam  Constitutional: She is oriented to person, place, and time. She appears well-developed and well-nourished.  Cardiovascular: Normal rate.  Pulmonary/Chest: Effort normal.  Musculoskeletal: Normal range of motion.  Neurological: She is alert and oriented to person, place, and time.  Skin: Skin is warm and dry.  Psychiatric: She has a normal mood and affect. Her behavior is normal.  Vitals reviewed.   RECENT LABS AND TESTS: BMET    Component Value Date/Time   NA 141 12/30/2017 1147   K 4.9 12/30/2017 1147   CL 102 12/30/2017 1147   CO2 23 12/30/2017 1147   GLUCOSE 108 (H) 12/30/2017 1147   GLUCOSE 113 (H) 06/20/2017 0856   BUN 17 12/30/2017 1147   CREATININE 0.87 12/30/2017 1147   CALCIUM 9.3 12/30/2017 1147   GFRNONAA 84 12/30/2017 1147   GFRAA 97 12/30/2017 1147   Lab Results  Component Value Date   HGBA1C 5.4 07/07/2018   HGBA1C 6.0 (H) 12/30/2017   HGBA1C 5.8 02/21/2017   Lab Results  Component Value Date   INSULIN 18.0 07/07/2018   INSULIN 89.7 (H) 12/30/2017   CBC    Component Value Date/Time   WBC 9.3 12/30/2017 1147   WBC 9.6 06/20/2017 0856   RBC 5.18 12/30/2017 1147   RBC 5.04 06/20/2017 0856   HGB 13.4 12/30/2017 1147   HCT 43.1 12/30/2017 1147   PLT 224.0 06/20/2017 0856   MCV 83 12/30/2017 1147   MCH 25.9 (L)  12/30/2017 1147   MCH 24.1 (L) 04/27/2017 2055   MCHC 31.1 (L) 12/30/2017 1147   MCHC 32.7 06/20/2017 0856   RDW 13.6 12/30/2017 1147   LYMPHSABS 2.0 12/30/2017 1147   MONOABS 0.6 02/21/2017 1510   EOSABS 0.2 12/30/2017 1147   BASOSABS 0.0 12/30/2017 1147   Iron/TIBC/Ferritin/ %Sat  Component Value Date/Time   IRON 85 06/20/2017 0856   TIBC 344 06/20/2017 0856   FERRITIN 55.4 06/20/2017 0856   IRONPCTSAT 25 06/20/2017 0856   Lipid Panel     Component Value Date/Time   CHOL 120 12/30/2017 1147   TRIG 65 12/30/2017 1147   HDL 45 12/30/2017 1147   CHOLHDL 3.3 04/08/2017 1124   CHOLHDL 4.7 02/22/2017 0420   VLDL 11 02/22/2017 0420   LDLCALC 62 12/30/2017 1147   Hepatic Function Panel     Component Value Date/Time   PROT 7.3 12/30/2017 1147   ALBUMIN 4.5 12/30/2017 1147   AST 24 12/30/2017 1147   ALT 28 12/30/2017 1147   ALKPHOS 70 12/30/2017 1147   BILITOT 0.3 12/30/2017 1147   BILIDIR 0.4 04/14/2017 0315   IBILI 0.7 04/14/2017 0315      Component Value Date/Time   TSH 1.270 12/30/2017 1147   TSH 3.01 06/20/2017 0856   TSH 4.78 (H) 02/21/2017 1510   Results for TONJUA, ROSSETTI (MRN [id number]) as of 10/16/2018 10:21  Ref. Range 07/07/2018 15:38  Vitamin D, 25-Hydroxy Latest Ref Range: 30.0 - 100.0 ng/mL 35.5   ASSESSMENT AND PLAN: Prediabetes - Plan: metFORMIN (GLUCOPHAGE) 500 MG tablet  Vitamin D deficiency  At risk for diabetes mellitus  Class 3 severe obesity with serious comorbidity and body mass index (BMI) of 45.0 to 49.9 in adult, unspecified obesity type (HCC)  PLAN: Pre-Diabetes Flara will continue to work on weight loss, exercise, and decreasing simple carbohydrates in her diet to help decrease the risk of diabetes. We dicussed metformin including benefits and risks. She was informed that eating too many simple carbohydrates or too many calories at one sitting increases the likelihood of GI side effects. Aleja agrees to continue taking Metformin 500 mg  qd #30 with no refills. Scotlynn agrees to follow up in our office in 3 weeks.   Diabetes risk counselling Audreena was given extended (15 minutes) diabetes prevention counseling today. She is 42 y.o. female and has risk factors for diabetes including obesity. We discussed intensive lifestyle modifications today with an emphasis on weight loss as well as increasing exercise and decreasing simple carbohydrates in her diet.  Vitamin D Deficiency Royale was informed that low vitamin D levels contributes to fatigue and are associated with obesity, breast, and colon cancer. She agrees to continue taking prescription Vit D @50 ,000 IU every week and will follow up for routine testing of vitamin D, at least 2-3 times per year. She was informed of the risk of over-replacement of vitamin D and agrees to not increase her dose unless she discusses this with [name] first. Solymar agrees to follow up with our office in 3 weeks.   Obesity Saige is currently in the action stage of change. As such, her goal is to continue with weight loss efforts She has agreed to keep a food journal with 1450-1600 calories and 100+ protein  Summer has been instructed to work up to a goal of 150 minutes of combined cardio and strengthening exercise per week for weight loss and overall health benefits. We discussed the following Behavioral Modification Strategies today: increasing lean protein intake, planning for success and keep a strict food journal, increasing vegetables and work on meal planning and easy cooking plans  Kathleena has agreed to follow up with our clinic in 3 weeks. She was informed of the importance of frequent follow up visits to maximize her success with intensive lifestyle modifications for her multiple health conditions.  OBESITY BEHAVIORAL INTERVENTION VISIT  Today's visit was # 17   Starting weight: 351 lbs Starting date: 12/30/2017 Today's weight : Weight: (!) 301 lb (136.5 kg)  Today's date: 10/15/2018 Total lbs  lost to date: 50 lbs At least 15 minutes were spent on discussing the following behavioral intervention visit.   ASK: We discussed the diagnosis of obesity with Beverly Sessions today and Tamaiya agreed to give [name] permission to discuss obesity behavioral modification therapy today.  ASSESS: Manami has the diagnosis of obesity and her BMI today is 47.13 Jadira is in the action stage of change   ADVISE: Finesse was educated on the multiple health risks of obesity as well as the benefit of weight loss to improve her health. She was advised of the need for long term treatment and the importance of lifestyle modifications to improve her current health and to decrease her risk of future health problems.  AGREE: Multiple dietary modification options and treatment options were discussed and  Krithi agreed to follow the recommendations documented in the above note.  ARRANGE: Emonii was educated on the importance of frequent visits to treat obesity as outlined per CMS and USPSTF guidelines and agreed to schedule her next follow up appointment today.  Raelene Bott, CMA, am acting as transcriptionist for Debbra Riding, MD  I have reviewed the above documentation for accuracy and completeness, and I agree with the above. - Debbra Riding, MD

## 2018-10-24 MED FILL — metFORMIN HCL 500 MG TABS: 500 | 30 days supply | Qty: 30 | Fill #0

## 2018-10-28 ENCOUNTER — Ambulatory Visit
Admission: RE | Admit: 2018-10-28 | Discharge: 2018-10-28 | Disposition: A | Discharge status: Home / Self Care | Payer: 59 | Source: Ambulatory Visit | Attending: Nurse Practitioner | Admitting: Nurse Practitioner

## 2018-10-28 DIAGNOSIS — Z1231 Encounter for screening mammogram for malignant neoplasm of breast: Secondary | ICD-10-CM | POA: Diagnosis not present

## 2018-10-29 DIAGNOSIS — I5032 Chronic diastolic (congestive) heart failure: Secondary | ICD-10-CM | POA: Diagnosis not present

## 2018-10-29 DIAGNOSIS — I159 Secondary hypertension, unspecified: Secondary | ICD-10-CM | POA: Diagnosis not present

## 2018-10-29 DIAGNOSIS — I5041 Acute combined systolic (congestive) and diastolic (congestive) heart failure: Secondary | ICD-10-CM | POA: Diagnosis not present

## 2018-11-05 ENCOUNTER — Ambulatory Visit (INDEPENDENT_AMBULATORY_CARE_PROVIDER_SITE_OTHER): Payer: 59 | Admitting: Family Medicine

## 2018-11-05 ENCOUNTER — Encounter (INDEPENDENT_AMBULATORY_CARE_PROVIDER_SITE_OTHER): Payer: Self-pay

## 2018-11-10 ENCOUNTER — Ambulatory Visit (INDEPENDENT_AMBULATORY_CARE_PROVIDER_SITE_OTHER): Payer: 59 | Admitting: Family Medicine

## 2018-11-10 VITALS — BP 141/84 | HR 78 | Temp 98.4°F | Ht 67.0 in | Wt 293.0 lb

## 2018-11-10 DIAGNOSIS — Z6841 Body Mass Index (BMI) 40.0 and over, adult: Secondary | ICD-10-CM

## 2018-11-10 DIAGNOSIS — E559 Vitamin D deficiency, unspecified: Secondary | ICD-10-CM

## 2018-11-10 DIAGNOSIS — Z9189 Other specified personal risk factors, not elsewhere classified: Secondary | ICD-10-CM | POA: Diagnosis not present

## 2018-11-10 DIAGNOSIS — R7303 Prediabetes: Secondary | ICD-10-CM | POA: Diagnosis not present

## 2018-11-10 MED ORDER — METFORMIN HCL 500 MG PO TABS: ORAL_TABLET | ORAL | 0 refills | Status: DC

## 2018-11-10 MED ORDER — VITAMIN D (ERGOCALCIFEROL) 1.25 MG (50000 UNIT) PO CAPS
50000.0000 [IU] | ORAL_CAPSULE | ORAL | 0 refills | Status: DC
Start: 2018-11-10 — End: 2018-12-04

## 2018-11-10 MED FILL — VIT D2 1.25 MG (50,000 UNIT: 1.25 MG | 28 days supply | Qty: 4 | Fill #0

## 2018-11-12 ENCOUNTER — Other Ambulatory Visit: Payer: Self-pay | Admitting: Nurse Practitioner

## 2018-11-12 DIAGNOSIS — I10 Essential (primary) hypertension: Secondary | ICD-10-CM

## 2018-11-12 NOTE — Progress Notes (Signed)
Office: [phone]  /  Fax: [phone]   HPI:   Chief Complaint: OBESITY Amber Bentley is here to discuss her progress with her obesity treatment plan. She is on the keep a food journal with 1450 to 1600 calories and 100+ grams of protein daily plan and is following her eating plan approximately 85 to 90 % of the time. She states she is walking and swimming 150 minutes weekly. Amber Bentley is journaling all days some days and some days she is journaling most of the day and occasionally she is not journaling.  Her weight is 293 lb (132.9 kg) today and has had a weight loss of 8 pounds over a period of 4 weeks since her last visit. She has lost 58 lbs since starting treatment with [name].  Pre-Diabetes Amber Bentley has a diagnosis of prediabetes based on her elevated Hgb A1c and was informed this puts her at greater risk of developing diabetes. She denies any GI side effects of metformin and she continues to work on diet and exercise to decrease risk of diabetes. She is occasionally craving carbohydrates.  At risk for diabetes Amber Bentley is at higher than average risk for developing diabetes due to her obesity and pre-diabetes. She currently denies polyuria or polydipsia.  Vitamin D deficiency Amber Bentley has a diagnosis of vitamin D deficiency. She is currently taking vit D and her fatigue is improving. Amber Bentley denies nausea, vomiting or muscle weakness.  Hypertension Amber Bentley is a 42 y.o. female with hypertension. Her blood pressure is slightly elevated today at 141/84 and patient did not take her medication today. Amber Bentley denies chest pain or shortness of breath on exertion. She is working weight loss to help control her blood pressure with the goal of decreasing her risk of heart attack and stroke. Amber Bentley blood pressure is not currently controlled.  ALLERGIES: Allergies  Allergen Reactions  . 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   [name] [Naproxen] Other (See  Comments)    Pt states that this medication makes her loopy.     MEDICATIONS: Current Outpatient Medications on File Prior to Visit  Medication Sig Dispense Refill  . carvedilol (COREG) 6.25 MG tablet Take 1 tablet (6.25 mg total) by mouth 2 (two) times daily. 180 tablet 3  . lisinopril (PRINIVIL,ZESTRIL) 20 MG tablet TAKE 1 TABLET BY MOUTH ONCE DAILY 90 tablet 1  . pantoprazole (PROTONIX) 40 MG tablet TAKE 1 TABLET BY MOUTH TWICE DAILY 180 tablet 1  . rosuvastatin (CRESTOR) 10 MG tablet TAKE 1 TABLET BY MOUTH DAILY. 90 tablet 3  . spironolactone (ALDACTONE) 25 MG tablet Take 1 tablet (25 mg total) by mouth daily. 30 tablet 0   No current facility-administered medications on file prior to visit.     PAST MEDICAL HISTORY: Past Medical History:  Diagnosis Date  . Anemia   . Anxiety   . Cardiomegaly   . Chest pain   . CHF (congestive heart failure) (HCC)   . Constipation   . Depression   . Dyspnea   . Fatigue   . Food allergy    celery  . GERD (gastroesophageal reflux disease)   . Hip pain   . HLD (hyperlipidemia)   . HTN (hypertension)   . Infertility, female   . Lactose intolerance   . Leg edema   . Lower back pain   . OSA on CPAP   . Palpitations   . Prediabetes   . Shoulder pain   .  Stomach ulcer   . Varicose vein of leg     PAST SURGICAL HISTORY: Past Surgical History:  Procedure Laterality Date  . ABDOMINAL HYSTERECTOMY    . ENDOMETRIAL ABLATION  2007   prior to hysterectomy    SOCIAL HISTORY: Social History   Tobacco Use  . Smoking status: Former Smoker    Packs/day: 0.25    Years: 10.00    Pack years: 2.50    Types: Cigarettes  . Smokeless tobacco: Never Used  Substance Use Topics  . Alcohol use: Yes    Comment: rarely  . Drug use: No    FAMILY HISTORY: Family History  Problem Relation Age of Onset  . Hypertension Mother   . Cancer Mother        breast cancer  . Kidney disease Mother   . Depression Mother   . Obesity Mother   .  Breast cancer Mother   . Diabetes Father   . Heart disease Father 106  . Hyperlipidemia Father   . Hypertension Father   . Depression Father   . Anxiety disorder Father   . Sleep apnea Father   . Obesity Father   . Thyroid disease Sister   . Sudden death Brother   . Cancer Maternal Aunt        breast cancer  . Breast cancer Maternal Aunt        in 8's  . Cancer Paternal Grandfather        brain cancer  . Cancer Maternal Grandmother        breast  . Breast cancer Maternal Grandmother   . Breast cancer Paternal Grandmother     ROS: Review of Systems  Constitutional: Positive for malaise/fatigue and weight loss.  Cardiovascular: Negative for chest pain.       Negative for chest pressure  Gastrointestinal: Negative for diarrhea, nausea and vomiting.  Genitourinary: Negative for frequency.  Musculoskeletal:       Negative for muscle weakness  Neurological: Negative for headaches.  Endo/Heme/Allergies: Negative for polydipsia.       + cravings    PHYSICAL EXAM: Blood pressure (!) 141/84, pulse 78, temperature 98.4 F (36.9 C), temperature source Oral, height 5\' 7"  (1.702 m), weight 293 lb (132.9 kg), SpO2 96 %. Body mass index is 45.89 kg/m. Physical Exam  Constitutional: She is oriented to person, place, and time. She appears well-developed and well-nourished.  Cardiovascular: Normal rate.  Pulmonary/Chest: Effort normal.  Musculoskeletal: Normal range of motion.  Neurological: She is oriented to person, place, and time.  Skin: Skin is warm and dry.  Psychiatric: She has a normal mood and affect. Her behavior is normal.  Vitals reviewed.   RECENT LABS AND TESTS: BMET    Component Value Date/Time   NA 141 12/30/2017 1147   K 4.9 12/30/2017 1147   CL 102 12/30/2017 1147   CO2 23 12/30/2017 1147   GLUCOSE 108 (H) 12/30/2017 1147   GLUCOSE 113 (H) 06/20/2017 0856   BUN 17 12/30/2017 1147   CREATININE 0.87 12/30/2017 1147   CALCIUM 9.3 12/30/2017 1147    GFRNONAA 84 12/30/2017 1147   GFRAA 97 12/30/2017 1147   Lab Results  Component Value Date   HGBA1C 5.4 07/07/2018   HGBA1C 6.0 (H) 12/30/2017   HGBA1C 5.8 02/21/2017   Lab Results  Component Value Date   INSULIN 18.0 07/07/2018   INSULIN 89.7 (H) 12/30/2017   CBC    Component Value Date/Time   WBC 9.3 12/30/2017 1147  WBC 9.6 06/20/2017 0856   RBC 5.18 12/30/2017 1147   RBC 5.04 06/20/2017 0856   HGB 13.4 12/30/2017 1147   HCT 43.1 12/30/2017 1147   PLT 224.0 06/20/2017 0856   MCV 83 12/30/2017 1147   MCH 25.9 (L) 12/30/2017 1147   MCH 24.1 (L) 04/27/2017 2055   MCHC 31.1 (L) 12/30/2017 1147   MCHC 32.7 06/20/2017 0856   RDW 13.6 12/30/2017 1147   LYMPHSABS 2.0 12/30/2017 1147   MONOABS 0.6 02/21/2017 1510   EOSABS 0.2 12/30/2017 1147   BASOSABS 0.0 12/30/2017 1147   Iron/TIBC/Ferritin/ %Sat    Component Value Date/Time   IRON 85 06/20/2017 0856   TIBC 344 06/20/2017 0856   FERRITIN 55.4 06/20/2017 0856   IRONPCTSAT 25 06/20/2017 0856   Lipid Panel     Component Value Date/Time   CHOL 120 12/30/2017 1147   TRIG 65 12/30/2017 1147   HDL 45 12/30/2017 1147   CHOLHDL 3.3 04/08/2017 1124   CHOLHDL 4.7 02/22/2017 0420   VLDL 11 02/22/2017 0420   LDLCALC 62 12/30/2017 1147   Hepatic Function Panel     Component Value Date/Time   PROT 7.3 12/30/2017 1147   ALBUMIN 4.5 12/30/2017 1147   AST 24 12/30/2017 1147   ALT 28 12/30/2017 1147   ALKPHOS 70 12/30/2017 1147   BILITOT 0.3 12/30/2017 1147   BILIDIR 0.4 04/14/2017 0315   IBILI 0.7 04/14/2017 0315      Component Value Date/Time   TSH 1.270 12/30/2017 1147   TSH 3.01 06/20/2017 0856   TSH 4.78 (H) 02/21/2017 1510   Results for JOVANNI, ECKHART (MRN [id number]) as of 11/12/2018 15:48  Ref. Range 07/07/2018 15:38  Vitamin D, 25-Hydroxy Latest Ref Range: 30.0 - 100.0 ng/mL 35.5   ASSESSMENT AND PLAN: Prediabetes - Plan: metFORMIN (GLUCOPHAGE) 500 MG tablet  Vitamin D deficiency - Plan: Vitamin  D, Ergocalciferol, (DRISDOL) 1.25 MG (50000 UT) CAPS capsule  At risk for diabetes mellitus  Class 3 severe obesity with serious comorbidity and body mass index (BMI) of 45.0 to 49.9 in adult, unspecified obesity type [facility])  PLAN:  Pre-Diabetes Amber Bentley will continue to work on weight loss, exercise, and decreasing simple carbohydrates in her diet to help decrease the risk of diabetes. We dicussed metformin including benefits and risks. She was informed that eating too many simple carbohydrates or too many calories at one sitting increases the likelihood of GI side effects. Amber Bentley agreed to continue metformin for now and a prescription was written today for 1 month refill. Amber Bentley agreed to follow up with [name] as directed to monitor her progress.  Diabetes risk counseling Amber Bentley was given extended (15 minutes) diabetes prevention counseling today. She is 42 y.o. female and has risk factors for diabetes including obesity and pre-diabetes. We discussed intensive lifestyle modifications today with an emphasis on weight loss as well as increasing exercise and decreasing simple carbohydrates in her diet.  Vitamin D Deficiency Amber Bentley was informed that low vitamin D levels contributes to fatigue and are associated with obesity, breast, and colon cancer. She agrees to continue to take prescription Vit D @50 ,000 IU every week #4 with no refills and will follow up for routine testing of vitamin D, at least 2-3 times per year. She was informed of the risk of over-replacement of vitamin D and agrees to not increase her dose unless she discusses this with [name] first. Amber Bentley agrees to follow up with our clinic in 3 weeks.  Hypertension We discussed sodium restriction, working  on healthy weight loss, and a regular exercise program as the means to achieve improved blood pressure control. Amber Bentley agreed with this plan and agreed to follow up as directed. We will follow up with her blood pressure at the next appointment and will  continue to monitor her blood pressure as well as her progress with the above lifestyle modifications. She will continue her medications as prescribed and will watch for signs of hypotension as she continues her lifestyle modifications.  Obesity Amber Bentley is currently in the action stage of change. As such, her goal is to continue with weight loss efforts She has agreed to keep a food journal with 1450 to 1600 calories and 100+ grams of protein daily Amber Bentley has been instructed to work up to a goal of 150 minutes of combined cardio and strengthening exercise per week for weight loss and overall health benefits. We discussed the following Behavioral Modification Strategies today: planning for success, keep a strict food journal, increasing lean protein intake, increasing vegetables and work on meal planning and easy cooking plans  Amber Bentley has agreed to follow up with our clinic in 3 weeks. She was informed of the importance of frequent follow up visits to maximize her success with intensive lifestyle modifications for her multiple health conditions.   OBESITY BEHAVIORAL INTERVENTION VISIT  Today's visit was # 18   Starting weight: 351 lbs Starting date: 12/30/2017 Today's weight : 293 lbs Today's date: 11/10/2018 Total lbs lost to date: 23   ASK: We discussed the diagnosis of obesity with Amber Bentley today and Amber Bentley agreed to give [name] permission to discuss obesity behavioral modification therapy today.  ASSESS: Amber Bentley has the diagnosis of obesity and her BMI today is 45.88 Amber Bentley is in the action stage of change   ADVISE: Amber Bentley was educated on the multiple health risks of obesity as well as the benefit of weight loss to improve her health. She was advised of the need for long term treatment and the importance of lifestyle modifications to improve her current health and to decrease her risk of future health problems.  AGREE: Multiple dietary modification options and treatment options  were discussed and  Amber Bentley agreed to follow the recommendations documented in the above note.  ARRANGE: Amber Bentley was educated on the importance of frequent visits to treat obesity as outlined per CMS and USPSTF guidelines and agreed to schedule her next follow up appointment today.  I, [place] Crane, am acting as transcriptionist for Filbert Schilder, MD  I have reviewed the above documentation for accuracy and completeness, and I agree with the above. - Debbra Riding, MD

## 2018-11-15 ENCOUNTER — Other Ambulatory Visit: Payer: Self-pay | Admitting: Nurse Practitioner

## 2018-11-15 DIAGNOSIS — I10 Essential (primary) hypertension: Secondary | ICD-10-CM

## 2018-11-15 NOTE — Telephone Encounter (Signed)
Need to return to lab for repeat BMP in order to refill spironolactone

## 2018-11-17 ENCOUNTER — Other Ambulatory Visit: Payer: Self-pay | Admitting: Nurse Practitioner

## 2018-11-17 DIAGNOSIS — I10 Essential (primary) hypertension: Secondary | ICD-10-CM

## 2018-11-17 DIAGNOSIS — I5022 Chronic systolic (congestive) heart failure: Secondary | ICD-10-CM

## 2018-11-17 MED ORDER — SPIRONOLACTONE 25 MG PO TABS: 25.0000 mg | ORAL_TABLET | Freq: Every day | ORAL | 3 refills | Status: DC

## 2018-11-17 MED ORDER — LOSARTAN POTASSIUM 50 MG PO TABS: 50.0000 mg | ORAL_TABLET | Freq: Every day | ORAL | 11 refills | Status: DC

## 2018-11-17 MED FILL — SPIRONOLACTONE 25 MG TABS: 25 | 90 days supply | Qty: 90 | Fill #0

## 2018-11-17 MED FILL — LOSARTAN POTASSIUM 50 MG TA: 50 | 30 days supply | Qty: 30 | Fill #0

## 2018-11-17 NOTE — Telephone Encounter (Signed)
Pt stated she is coming in tomorrow.

## 2018-11-28 DIAGNOSIS — I5032 Chronic diastolic (congestive) heart failure: Secondary | ICD-10-CM | POA: Diagnosis not present

## 2018-11-28 DIAGNOSIS — I159 Secondary hypertension, unspecified: Secondary | ICD-10-CM | POA: Diagnosis not present

## 2018-11-28 DIAGNOSIS — I5041 Acute combined systolic (congestive) and diastolic (congestive) heart failure: Secondary | ICD-10-CM | POA: Diagnosis not present

## 2018-12-02 ENCOUNTER — Ambulatory Visit (INDEPENDENT_AMBULATORY_CARE_PROVIDER_SITE_OTHER): Payer: 59 | Admitting: Family Medicine

## 2018-12-04 ENCOUNTER — Encounter (INDEPENDENT_AMBULATORY_CARE_PROVIDER_SITE_OTHER): Payer: Self-pay | Admitting: Family Medicine

## 2018-12-04 ENCOUNTER — Ambulatory Visit (INDEPENDENT_AMBULATORY_CARE_PROVIDER_SITE_OTHER): Payer: 59 | Admitting: Family Medicine

## 2018-12-04 VITALS — BP 138/86 | HR 66 | Temp 97.8°F | Ht 67.0 in | Wt 301.0 lb

## 2018-12-04 DIAGNOSIS — E559 Vitamin D deficiency, unspecified: Secondary | ICD-10-CM

## 2018-12-04 DIAGNOSIS — Z9189 Other specified personal risk factors, not elsewhere classified: Secondary | ICD-10-CM

## 2018-12-04 DIAGNOSIS — Z6841 Body Mass Index (BMI) 40.0 and over, adult: Secondary | ICD-10-CM | POA: Diagnosis not present

## 2018-12-04 DIAGNOSIS — I5032 Chronic diastolic (congestive) heart failure: Secondary | ICD-10-CM

## 2018-12-04 MED ORDER — VITAMIN D (ERGOCALCIFEROL) 1.25 MG (50000 UNIT) PO CAPS: 50000.0000 [IU] | ORAL_CAPSULE | ORAL | 0 refills | Status: DC

## 2018-12-04 NOTE — Progress Notes (Signed)
Office: [phone]  /  Fax: [phone]   HPI:   Chief Complaint: OBESITY Amber Bentley is here to discuss her progress with her obesity treatment plan. She is on the keep a food journal with 1450-1600 calories and 100+ grams of protein daily and is following her eating plan approximately 75 % of the time. She states she is exercising 0 minutes 0 times per week. Amber Bentley has had a busy few weeks, with holiday festivities and a lot of indulgent food in form of cookies. She is feeling tired and drained from all the festivities.  Her weight is (!) 301 lb (136.5 kg) today and gained 9 pounds since her last visit. She has lost 50 lbs since starting treatment with [name].  Congestive Heart Failure Amber Bentley has congestive heart failure. She denies orthopnea, dyspnea, or edema. Her blood pressure is slightly elevated at 138/86 (manual blood pressure) but she voices she has had an increased amount of stress recently.  She has taken all her medications today.  Vitamin D Deficiency Amber Bentley has a diagnosis of vitamin D deficiency. She is currently taking prescription Vit D. She notes fatigue and denies nausea, vomiting or muscle weakness.  At risk for osteopenia and osteoporosis Amber Bentley is at higher risk of osteopenia and osteoporosis due to vitamin D deficiency.   ASSESSMENT AND PLAN:  Chronic diastolic CHF (congestive heart failure) (HCC)  Vitamin D deficiency - Plan: Vitamin D, Ergocalciferol, (DRISDOL) 1.25 MG (50000 UT) CAPS capsule  At risk for osteoporosis  Class 3 severe obesity with serious comorbidity and body mass index (BMI) of 45.0 to 49.9 in adult, unspecified obesity type (HCC)  PLAN:  Congestive Heart Failure Amber Bentley will follow up with Cardiology per previous scheduled appointment. Amber Bentley agrees to follow up with our clinic in 3 weeks.  Vitamin D Deficiency Amber Bentley was informed that low vitamin D levels contributes to fatigue and are associated with obesity, breast, and colon cancer. Amber Bentley agrees  to continue taking prescription Vit D @50 ,000 IU every week #4 and we will refill for 1 month. She will follow up for routine testing of vitamin D, at least 2-3 times per year. She was informed of the risk of over-replacement of vitamin D and agrees to not increase her dose unless she discusses this with [name] first. Amber Bentley agrees to follow up with our clinic in 3 weeks.  At risk for osteopenia and osteoporosis Amber Bentley was given extended (15 minutes) osteoporosis prevention counseling today. Amber Bentley is at risk for osteopenia and osteoporsis due to her vitamin D deficiency. She was encouraged to take her vitamin D and follow her higher calcium diet and increase strengthening exercise to help strengthen her bones and decrease her risk of osteopenia and osteoporosis.  Obesity Amber Bentley is currently in the action stage of change. As such, her goal is to continue with weight loss efforts She has agreed to keep a food journal with 1450-1600 calories and 100+ grams of protein daily Amber Bentley has been instructed to work up to a goal of 150 minutes of combined cardio and strengthening exercise per week for weight loss and overall health benefits. We discussed the following Behavioral Modification Strategies today: work on meal planning and easy cooking plans, holiday eating strategies, travel eating strategies, better snacking choices, planning for success, and keep a strict food journal   Amber Bentley has agreed to follow up with our clinic in 3 weeks. She was informed of the importance of frequent follow up visits to maximize her success with intensive lifestyle modifications for  her multiple health conditions.  ALLERGIES: Allergies  Allergen Reactions  . 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   Amber Bentley. Aleve [Naproxen] Other (See Comments)    Pt states that this medication makes her loopy.     MEDICATIONS: Current Outpatient Medications on File Prior to Visit  Medication Sig Dispense Refill    . carvedilol (COREG) 6.25 MG tablet Take 1 tablet (6.25 mg total) by mouth 2 (two) times daily. 180 tablet 3  . losartan (COZAAR) 50 MG tablet Take 1 tablet (50 mg total) by mouth daily. 30 tablet 11  . metFORMIN (GLUCOPHAGE) 500 MG tablet TAKE 1 TABLET BY MOUTH ONCE DAILY WITH BREAKFAST 30 tablet 0  . pantoprazole (PROTONIX) 40 MG tablet TAKE 1 TABLET BY MOUTH TWICE DAILY 180 tablet 1  . rosuvastatin (CRESTOR) 10 MG tablet TAKE 1 TABLET BY MOUTH DAILY. 90 tablet 3  . spironolactone (ALDACTONE) 25 MG tablet Take 1 tablet (25 mg total) by mouth daily. 90 tablet 3   No current facility-administered medications on file prior to visit.     PAST MEDICAL HISTORY: Past Medical History:  Diagnosis Date  . Anemia   . Anxiety   . Cardiomegaly   . Chest pain   . CHF (congestive heart failure) (HCC)   . Constipation   . Depression   . Dyspnea   . Fatigue   . Food allergy    celery  . GERD (gastroesophageal reflux disease)   . Hip pain   . HLD (hyperlipidemia)   . HTN (hypertension)   . Infertility, female   . Lactose intolerance   . Leg edema   . Lower back pain   . OSA on CPAP   . Palpitations   . Prediabetes   . Shoulder pain   . Stomach ulcer   . Varicose vein of leg     PAST SURGICAL HISTORY: Past Surgical History:  Procedure Laterality Date  . ABDOMINAL HYSTERECTOMY    . ENDOMETRIAL ABLATION  2007   prior to hysterectomy    SOCIAL HISTORY: Social History   Tobacco Use  . Smoking status: Former Smoker    Packs/day: 0.25    Years: 10.00    Pack years: 2.50    Types: Cigarettes  . Smokeless tobacco: Never Used  Substance Use Topics  . Alcohol use: Yes    Comment: rarely  . Drug use: No    FAMILY HISTORY: Family History  Problem Relation Age of Onset  . Hypertension Mother   . Cancer Mother        breast cancer  . Kidney disease Mother   . Depression Mother   . Obesity Mother   . Breast cancer Mother   . Diabetes Father   . Heart disease Father [id number]  .  Hyperlipidemia Father   . Hypertension Father   . Depression Father   . Anxiety disorder Father   . Sleep apnea Father   . Obesity Father   . Thyroid disease Sister   . Sudden death Brother   . Cancer Maternal Aunt        breast cancer  . Breast cancer Maternal Aunt        in 5150's  . Cancer Paternal Grandfather        brain cancer  . Cancer Maternal Grandmother        breast  . Breast cancer Maternal Grandmother   . Breast cancer Paternal Grandmother     ROS:  Review of Systems  Constitutional: Positive for malaise/fatigue. Negative for weight loss.  Respiratory: Negative for shortness of breath.   Cardiovascular: Negative for orthopnea.  Gastrointestinal: Negative for nausea and vomiting.  Musculoskeletal:       Negative muscle weakness    PHYSICAL EXAM: Blood pressure 138/86, pulse 66, temperature 97.8 F (36.6 C), temperature source Oral, height 5\' 7"  (1.702 m), weight (!) 301 lb (136.5 kg), SpO2 98 %. Body mass index is 47.14 kg/m. Physical Exam Vitals signs reviewed.  Constitutional:      Appearance: Normal appearance. She is obese.  Cardiovascular:     Rate and Rhythm: Normal rate.     Pulses: Normal pulses.  Pulmonary:     Effort: Pulmonary effort is normal.  Musculoskeletal: Normal range of motion.  Skin:    General: Skin is warm and dry.  Neurological:     Mental Status: She is alert and oriented to person, place, and time.  Psychiatric:        Mood and Affect: Mood normal.        Behavior: Behavior normal.     RECENT LABS AND TESTS: BMET    Component Value Date/Time   NA 141 12/30/2017 1147   K 4.9 12/30/2017 1147   CL 102 12/30/2017 1147   CO2 23 12/30/2017 1147   GLUCOSE 108 (H) 12/30/2017 1147   GLUCOSE 113 (H) 06/20/2017 0856   BUN 17 12/30/2017 1147   CREATININE 0.87 12/30/2017 1147   CALCIUM 9.3 12/30/2017 1147   GFRNONAA 84 12/30/2017 1147   GFRAA 97 12/30/2017 1147   Lab Results  Component Value Date   HGBA1C 5.4 07/07/2018    HGBA1C 6.0 (H) 12/30/2017   HGBA1C 5.8 02/21/2017   Lab Results  Component Value Date   INSULIN 18.0 07/07/2018   INSULIN 89.7 (H) 12/30/2017   CBC    Component Value Date/Time   WBC 9.3 12/30/2017 1147   WBC 9.6 06/20/2017 0856   RBC 5.18 12/30/2017 1147   RBC 5.04 06/20/2017 0856   HGB 13.4 12/30/2017 1147   HCT 43.1 12/30/2017 1147   PLT 224.0 06/20/2017 0856   MCV 83 12/30/2017 1147   MCH 25.9 (L) 12/30/2017 1147   MCH 24.1 (L) 04/27/2017 2055   MCHC 31.1 (L) 12/30/2017 1147   MCHC 32.7 06/20/2017 0856   RDW 13.6 12/30/2017 1147   LYMPHSABS 2.0 12/30/2017 1147   MONOABS 0.6 02/21/2017 1510   EOSABS 0.2 12/30/2017 1147   BASOSABS 0.0 12/30/2017 1147   Iron/TIBC/Ferritin/ %Sat    Component Value Date/Time   IRON 85 06/20/2017 0856   TIBC 344 06/20/2017 0856   FERRITIN 55.4 06/20/2017 0856   IRONPCTSAT 25 06/20/2017 0856   Lipid Panel     Component Value Date/Time   CHOL 120 12/30/2017 1147   TRIG 65 12/30/2017 1147   HDL 45 12/30/2017 1147   CHOLHDL 3.3 04/08/2017 1124   CHOLHDL 4.7 02/22/2017 0420   VLDL 11 02/22/2017 0420   LDLCALC 62 12/30/2017 1147   Hepatic Function Panel     Component Value Date/Time   PROT 7.3 12/30/2017 1147   ALBUMIN 4.5 12/30/2017 1147   AST 24 12/30/2017 1147   ALT 28 12/30/2017 1147   ALKPHOS 70 12/30/2017 1147   BILITOT 0.3 12/30/2017 1147   BILIDIR 0.4 04/14/2017 0315   IBILI 0.7 04/14/2017 0315      Component Value Date/Time   TSH 1.270 12/30/2017 1147   TSH 3.01 06/20/2017 0856   TSH 4.78 (H)  02/21/2017 1510      OBESITY BEHAVIORAL INTERVENTION VISIT  Today's visit was # 19   Starting weight: 351 lbs Starting date: 12/30/17 Today's weight : 301 lbs  Today's date: 12/04/2018 Total lbs lost to date: 50    ASK: We discussed the diagnosis of obesity with [name] today and Svetlana agreed to give [name] permission to discuss obesity behavioral modification therapy today.  ASSESS: Sybrina has the  diagnosis of obesity and her BMI today is 47.13 Cassaundra is in the action stage of change   ADVISE: Beatris was educated on the multiple health risks of obesity as well as the benefit of weight loss to improve her health. She was advised of the need for long term treatment and the importance of lifestyle modifications to improve her current health and to decrease her risk of future health problems.  AGREE: Multiple dietary modification options and treatment options were discussed and  Adaia agreed to follow the recommendations documented in the above note.  ARRANGE: Yoceline was educated on the importance of frequent visits to treat obesity as outlined per CMS and USPSTF guidelines and agreed to schedule her next follow up appointment today.  I, Burt Knack, am acting as transcriptionist for Debbra Riding, MD  I have reviewed the above documentation for accuracy and completeness, and I agree with the above. - Debbra Riding, MD

## 2018-12-17 MED FILL — ROSUVASTATIN CALCIUM 10 MG: 10 | 90 days supply | Qty: 90 | Fill #1

## 2018-12-18 MED FILL — metFORMIN HCL 500 MG TABS: 500 | 30 days supply | Qty: 30 | Fill #0

## 2018-12-19 MED FILL — VIT D2 1.25 MG (50,000 UNIT: 1.25 MG | 28 days supply | Qty: 4 | Fill #0

## 2018-12-29 DIAGNOSIS — I5041 Acute combined systolic (congestive) and diastolic (congestive) heart failure: Secondary | ICD-10-CM | POA: Diagnosis not present

## 2018-12-29 DIAGNOSIS — I5032 Chronic diastolic (congestive) heart failure: Secondary | ICD-10-CM | POA: Diagnosis not present

## 2018-12-29 DIAGNOSIS — I159 Secondary hypertension, unspecified: Secondary | ICD-10-CM | POA: Diagnosis not present

## 2018-12-30 ENCOUNTER — Ambulatory Visit (INDEPENDENT_AMBULATORY_CARE_PROVIDER_SITE_OTHER): Payer: 59 | Admitting: Family Medicine

## 2018-12-30 ENCOUNTER — Encounter (INDEPENDENT_AMBULATORY_CARE_PROVIDER_SITE_OTHER): Payer: Self-pay | Admitting: Family Medicine

## 2018-12-30 VITALS — BP 130/83 | HR 65 | Temp 97.9°F | Ht 67.0 in | Wt 307.0 lb

## 2018-12-30 DIAGNOSIS — Z9189 Other specified personal risk factors, not elsewhere classified: Secondary | ICD-10-CM

## 2018-12-30 DIAGNOSIS — E559 Vitamin D deficiency, unspecified: Secondary | ICD-10-CM | POA: Diagnosis not present

## 2018-12-30 DIAGNOSIS — Z6841 Body Mass Index (BMI) 40.0 and over, adult: Secondary | ICD-10-CM | POA: Diagnosis not present

## 2018-12-30 DIAGNOSIS — F329 Major depressive disorder, single episode, unspecified: Secondary | ICD-10-CM

## 2018-12-31 MED ORDER — VITAMIN D (ERGOCALCIFEROL) 1.25 MG (50000 UNIT) PO CAPS: 50000.0000 [IU] | ORAL_CAPSULE | ORAL | 0 refills | Status: DC

## 2018-12-31 MED ORDER — VORTIOXETINE HBR 10 MG PO TABS: 10.0000 mg | ORAL_TABLET | Freq: Every day | ORAL | 0 refills | Status: DC

## 2018-12-31 NOTE — Progress Notes (Addendum)
Office: [phone]  /  Fax: [phone]   HPI:   Chief Complaint: OBESITY Amber Bentley is here to discuss her progress with her obesity treatment plan. She is on the keep a food journal with 1450-1600 calories and 100+ grams of protein daily and is following her eating plan approximately 50 % of the time. She states she is walking for 60 minutes 7 times per week. Amber Bentley had a holiday trip to [place] and enjoyed it, but it was stressful. She has been feeling depressed recently and says she has struggled with eating.  Her weight is (!) 307 lb (139.3 kg) today and has gained 6 pounds since her last visit. She has lost 44 lbs since starting treatment with [name].  Major Depressive Disorder Amber Bentley reports that she has previously had thoughts of hurting herself but no intent and no current thoughts. She notes recurrence recently of symptoms, and she was diagnosed in the past. She denies suicidal ideas or homicidal ideas.  Patient denies that she currently does not see a therapist or counselor.  She does not want to see a counselor and therapist.  Vitamin D Deficiency Amber Bentley has a diagnosis of vitamin D deficiency. She is currently taking prescription Vit D. She notes fatigue and denies nausea, vomiting or muscle weakness.  At risk for osteopenia and osteoporosis Amber Bentley is at higher risk of osteopenia and osteoporosis due to vitamin D deficiency.   ASSESSMENT AND PLAN:  Vitamin D deficiency - Plan: Vitamin D, Ergocalciferol, (DRISDOL) 1.25 MG (50000 UT) CAPS capsule  Major depressive disorder, remission status unspecified, unspecified whether recurrent - Plan: vortioxetine HBr (TRINTELLIX) 10 MG TABS tablet  At risk for osteoporosis  Class 3 severe obesity with serious comorbidity and body mass index (BMI) of 45.0 to 49.9 in adult, unspecified obesity type (HCC)  PLAN:  Major Depressive Disorder Amber Bentley agrees to start Trintellix 10 mg PO daily #30 with no refills. Amber Bentley agrees to follow up with  our clinic in 2 weeks. She states she will go the ED if she develops any suicide plan or significant suicidal or homicidal thoughts.  Vitamin D Deficiency Amber Bentley was informed that low vitamin D levels contributes to fatigue and are associated with obesity, breast, and colon cancer. Amber Bentley agrees to continue taking prescription Vit D @50 ,000 IU every week #4 and we will refill for 1 month. She will follow up for routine testing of vitamin D, at least 2-3 times per year. She was informed of the risk of over-replacement of vitamin D and agrees to not increase her dose unless she discusses this with [name] first. Amber Bentley agrees to follow up with our clinic in 2 weeks.  At risk for osteopenia and osteoporosis Amber Bentley was given extended (15 minutes) osteoporosis prevention counseling today. Amber Bentley is at risk for osteopenia and osteoporsis due to her vitamin D deficiency. She was encouraged to take her vitamin D and follow her higher calcium diet and increase strengthening exercise to help strengthen her bones and decrease her risk of osteopenia and osteoporosis.  Obesity Amber Bentley is currently in the action stage of change. As such, her goal is to continue with weight loss efforts She has agreed to keep a food journal with 1450-1600 calories and 100+ grams of protein daily Amber Bentley has been instructed to work up to a goal of 150 minutes of combined cardio and strengthening exercise per week for weight loss and overall health benefits. We discussed the following Behavioral Modification Strategies today: increasing lean protein intake, increasing vegetables,  work on meal planning and easy cooking plans, emotional eating strategies, better snacking choices, and planning for success   Amber Bentley has agreed to follow up with our clinic in 2 weeks. She was informed of the importance of frequent follow up visits to maximize her success with intensive lifestyle modifications for her multiple health conditions.  ALLERGIES: Allergies    Allergen Reactions  . 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   [name] [Naproxen] Other (See Comments)    Pt states that this medication makes her loopy.     MEDICATIONS: Current Outpatient Medications on File Prior to Visit  Medication Sig Dispense Refill  . carvedilol (COREG) 6.25 MG tablet Take 1 tablet (6.25 mg total) by mouth 2 (two) times daily. 180 tablet 3  . losartan (COZAAR) 50 MG tablet Take 1 tablet (50 mg total) by mouth daily. 30 tablet 11  . metFORMIN (GLUCOPHAGE) 500 MG tablet TAKE 1 TABLET BY MOUTH ONCE DAILY WITH BREAKFAST 30 tablet 0  . pantoprazole (PROTONIX) 40 MG tablet TAKE 1 TABLET BY MOUTH TWICE DAILY 180 tablet 1  . rosuvastatin (CRESTOR) 10 MG tablet TAKE 1 TABLET BY MOUTH DAILY. 90 tablet 3  . spironolactone (ALDACTONE) 25 MG tablet Take 1 tablet (25 mg total) by mouth daily. 90 tablet 3   No current facility-administered medications on file prior to visit.     PAST MEDICAL HISTORY: Past Medical History:  Diagnosis Date  . Anemia   . Anxiety   . Cardiomegaly   . Chest pain   . CHF (congestive heart failure) (HCC)   . Constipation   . Depression   . Dyspnea   . Fatigue   . Food allergy    celery  . GERD (gastroesophageal reflux disease)   . Hip pain   . HLD (hyperlipidemia)   . HTN (hypertension)   . Infertility, female   . Lactose intolerance   . Leg edema   . Lower back pain   . OSA on CPAP   . Palpitations   . Prediabetes   . Shoulder pain   . Stomach ulcer   . Varicose vein of leg     PAST SURGICAL HISTORY: Past Surgical History:  Procedure Laterality Date  . ABDOMINAL HYSTERECTOMY    . ENDOMETRIAL ABLATION  2007   prior to hysterectomy    SOCIAL HISTORY: Social History   Tobacco Use  . Smoking status: Former Smoker    Packs/day: 0.25    Years: 10.00    Pack years: 2.50    Types: Cigarettes  . Smokeless tobacco: Never Used  Substance Use Topics  . Alcohol use: Yes    Comment: rarely   . Drug use: No    FAMILY HISTORY: Family History  Problem Relation Age of Onset  . Hypertension Mother   . Cancer Mother        breast cancer  . Kidney disease Mother   . Depression Mother   . Obesity Mother   . Breast cancer Mother   . Diabetes Father   . Heart disease Father 19  . Hyperlipidemia Father   . Hypertension Father   . Depression Father   . Anxiety disorder Father   . Sleep apnea Father   . Obesity Father   . Thyroid disease Sister   . Sudden death Brother   . Cancer Maternal Aunt        breast cancer  . Breast cancer Maternal Aunt  in 50's  . Cancer Paternal Grandfather        brain cancer  . Cancer Maternal Grandmother        breast  . Breast cancer Maternal Grandmother   . Breast cancer Paternal Grandmother     ROS: Review of Systems  Constitutional: Positive for malaise/fatigue. Negative for weight loss.  Gastrointestinal: Negative for nausea and vomiting.  Musculoskeletal:       Negative muscle weakness  Psychiatric/Behavioral: Positive for depression. Negative for suicidal ideas.       Negative homicidal ideas    PHYSICAL EXAM: Blood pressure 130/83, pulse 65, temperature 97.9 F (36.6 C), temperature source Oral, height 5\' 7"  (1.702 m), weight (!) 307 lb (139.3 kg), SpO2 98 %. Body mass index is 48.08 kg/m. Physical Exam Vitals signs reviewed.  Constitutional:      Appearance: Normal appearance. She is obese.  Cardiovascular:     Rate and Rhythm: Normal rate.     Pulses: Normal pulses.  Pulmonary:     Effort: Pulmonary effort is normal.     Breath sounds: Normal breath sounds.  Musculoskeletal: Normal range of motion.  Skin:    General: Skin is warm and dry.  Neurological:     Mental Status: She is alert and oriented to person, place, and time.  Psychiatric:        Mood and Affect: Mood normal.     RECENT LABS AND TESTS: BMET    Component Value Date/Time   NA 141 12/30/2017 1147   K 4.9 12/30/2017 1147   CL 102  12/30/2017 1147   CO2 23 12/30/2017 1147   GLUCOSE 108 (H) 12/30/2017 1147   GLUCOSE 113 (H) 06/20/2017 0856   BUN 17 12/30/2017 1147   CREATININE 0.87 12/30/2017 1147   CALCIUM 9.3 12/30/2017 1147   GFRNONAA 84 12/30/2017 1147   GFRAA 97 12/30/2017 1147   Lab Results  Component Value Date   HGBA1C 5.4 07/07/2018   HGBA1C 6.0 (H) 12/30/2017   HGBA1C 5.8 02/21/2017   Lab Results  Component Value Date   INSULIN 18.0 07/07/2018   INSULIN 89.7 (H) 12/30/2017   CBC    Component Value Date/Time   WBC 9.3 12/30/2017 1147   WBC 9.6 06/20/2017 0856   RBC 5.18 12/30/2017 1147   RBC 5.04 06/20/2017 0856   HGB 13.4 12/30/2017 1147   HCT 43.1 12/30/2017 1147   PLT 224.0 06/20/2017 0856   MCV 83 12/30/2017 1147   MCH 25.9 (L) 12/30/2017 1147   MCH 24.1 (L) 04/27/2017 2055   MCHC 31.1 (L) 12/30/2017 1147   MCHC 32.7 06/20/2017 0856   RDW 13.6 12/30/2017 1147   LYMPHSABS 2.0 12/30/2017 1147   MONOABS 0.6 02/21/2017 1510   EOSABS 0.2 12/30/2017 1147   BASOSABS 0.0 12/30/2017 1147   Iron/TIBC/Ferritin/ %Sat    Component Value Date/Time   IRON 85 06/20/2017 0856   TIBC 344 06/20/2017 0856   FERRITIN 55.4 06/20/2017 0856   IRONPCTSAT 25 06/20/2017 0856   Lipid Panel     Component Value Date/Time   CHOL 120 12/30/2017 1147   TRIG 65 12/30/2017 1147   HDL 45 12/30/2017 1147   CHOLHDL 3.3 04/08/2017 1124   CHOLHDL 4.7 02/22/2017 0420   VLDL 11 02/22/2017 0420   LDLCALC 62 12/30/2017 1147   Hepatic Function Panel     Component Value Date/Time   PROT 7.3 12/30/2017 1147   ALBUMIN 4.5 12/30/2017 1147   AST 24 12/30/2017 1147   ALT 28  12/30/2017 1147   ALKPHOS 70 12/30/2017 1147   BILITOT 0.3 12/30/2017 1147   BILIDIR 0.4 04/14/2017 0315   IBILI 0.7 04/14/2017 0315      Component Value Date/Time   TSH 1.270 12/30/2017 1147   TSH 3.01 06/20/2017 0856   TSH 4.78 (H) 02/21/2017 1510      OBESITY BEHAVIORAL INTERVENTION VISIT  Today's visit was # 20   Starting  weight: 351 lbs Starting date: 12/30/17 Today's weight : 307 lbs  Today's date: 12/30/2018 Total lbs lost to date: 70    ASK: We discussed the diagnosis of obesity with Amber Bentley today and Amber Bentley agreed to give [name] permission to discuss obesity behavioral modification therapy today.  ASSESS: Jailee has the diagnosis of obesity and her BMI today is 48.07 Amber Bentley is in the action stage of change   ADVISE: Amber Bentley was educated on the multiple health risks of obesity as well as the benefit of weight loss to improve her health. She was advised of the need for long term treatment and the importance of lifestyle modifications to improve her current health and to decrease her risk of future health problems.  AGREE: Multiple dietary modification options and treatment options were discussed and  Amber Bentley agreed to follow the recommendations documented in the above note.  ARRANGE: Amber Bentley was educated on the importance of frequent visits to treat obesity as outlined per CMS and USPSTF guidelines and agreed to schedule her next follow up appointment today.  I, Burt Knack, am acting as transcriptionist for Debbra Riding, MD  I have reviewed the above documentation for accuracy and completeness, and I agree with the above. - Debbra Riding, MD

## 2019-01-01 ENCOUNTER — Telehealth (INDEPENDENT_AMBULATORY_CARE_PROVIDER_SITE_OTHER): Payer: Self-pay | Admitting: Family Medicine

## 2019-01-01 NOTE — Telephone Encounter (Signed)
Patient returning call from message left this am concerning Trintellix prescription as well as following up on depression noted during last visit.  Patient reports that she is still struggling with feeling down and having some paranoia regarding feeling others judging her for feeling down.  She reports no homicidal or suicidal ideation or plan.  She states she gets some relief from depressed feelings while watching [employer].  Plan to follow up on Trintellix prescription as prior authorization was filled out and faxed yesterday.  If medication denied will start Lexapro.  Patient in agreement with said plan.

## 2019-01-02 MED FILL — LOSARTAN POTASSIUM 50 MG TA: 50 | 30 days supply | Qty: 30 | Fill #1

## 2019-01-02 MED FILL — CARVEDILOL 6.25 MG TABLET: 6.25 | 90 days supply | Qty: 180 | Fill #1

## 2019-01-05 ENCOUNTER — Encounter (INDEPENDENT_AMBULATORY_CARE_PROVIDER_SITE_OTHER): Payer: Self-pay

## 2019-01-05 MED FILL — TRINTELLIX 10 MG TABLET: 10 | 30 days supply | Qty: 30 | Fill #0

## 2019-01-20 ENCOUNTER — Encounter (INDEPENDENT_AMBULATORY_CARE_PROVIDER_SITE_OTHER): Payer: Self-pay | Admitting: Family Medicine

## 2019-01-20 ENCOUNTER — Ambulatory Visit (INDEPENDENT_AMBULATORY_CARE_PROVIDER_SITE_OTHER): Payer: 59 | Admitting: Family Medicine

## 2019-01-20 VITALS — BP 152/82 | HR 69 | Temp 97.7°F | Ht 67.0 in | Wt 311.0 lb

## 2019-01-20 DIAGNOSIS — Z9189 Other specified personal risk factors, not elsewhere classified: Secondary | ICD-10-CM

## 2019-01-20 DIAGNOSIS — R7303 Prediabetes: Secondary | ICD-10-CM

## 2019-01-20 DIAGNOSIS — Z6841 Body Mass Index (BMI) 40.0 and over, adult: Secondary | ICD-10-CM | POA: Diagnosis not present

## 2019-01-20 DIAGNOSIS — E559 Vitamin D deficiency, unspecified: Secondary | ICD-10-CM

## 2019-01-20 DIAGNOSIS — F329 Major depressive disorder, single episode, unspecified: Secondary | ICD-10-CM

## 2019-01-20 MED ORDER — VORTIOXETINE HBR 20 MG PO TABS: 20.0000 mg | ORAL_TABLET | Freq: Every day | ORAL | 0 refills | Status: DC

## 2019-01-20 MED ORDER — METFORMIN HCL 500 MG PO TABS: ORAL_TABLET | ORAL | 0 refills | Status: DC

## 2019-01-20 MED ORDER — VITAMIN D (ERGOCALCIFEROL) 1.25 MG (50000 UNIT) PO CAPS: 50000.0000 [IU] | ORAL_CAPSULE | ORAL | 0 refills | Status: DC

## 2019-01-20 MED FILL — TRINTELLIX 20 MG TABLET: 20 | 30 days supply | Qty: 30 | Fill #0

## 2019-01-20 MED FILL — metFORMIN HCL 500 MG TABS: 500 | 30 days supply | Qty: 30 | Fill #0

## 2019-01-20 MED FILL — VIT D2 1.25 MG (50,000 UNIT: 1.25 MG | 28 days supply | Qty: 4 | Fill #0

## 2019-01-21 ENCOUNTER — Encounter (INDEPENDENT_AMBULATORY_CARE_PROVIDER_SITE_OTHER): Payer: Self-pay | Admitting: Family Medicine

## 2019-01-21 NOTE — Progress Notes (Signed)
Office: [phone]  /  Fax: [phone]   HPI:   Chief Complaint: OBESITY Amber Bentley is here to discuss her progress with her obesity treatment plan. She is on the keep a food journal with 1450-1600 calories and 100+ grams of protein daily and is following her eating plan approximately 10 % of the time. She states she is exercising 0 minutes 0 times per week. Amber Bentley is doing some indulgent eating secondary to stress and feeling down. She is feeling like she eats well and healthy for breakfast and lunch, but struggles with snacking.  Her weight is (!) 311 lb (141.1 kg) today and has gained 4 pounds since her last visit. She has lost 40 lbs since starting treatment with us.  Major Depressive Disorder Amber Bentley reports that she hasn't felt much improvement with Trintellix at 10 mg daily. She has had a few good days. She denies suicidal ideas or homicidal ideas.  Vitamin D Deficiency Amber Bentley has a diagnosis of vitamin D deficiency. She is currently taking prescription Vit D. She notes fatigue and denies nausea, vomiting or muscle weakness.  Pre-Diabetes Amber Bentley has a diagnosis of pre-diabetes based on her elevated Hgb A1c and was informed this puts her at greater risk of developing diabetes. She notes carbohydrate cravings and denies GI side effects of metformin. She continues to work on diet and exercise to decrease risk of diabetes. She denies hypoglycemia.  At risk for diabetes Amber Bentley is at higher than average risk for developing diabetes due to her obesity and pre-diabetes. She currently denies polyuria or polydipsia.  ASSESSMENT AND PLAN:  Major depressive disorder, remission status unspecified, unspecified whether recurrent - Plan: vortioxetine HBr (TRINTELLIX) 20 MG TABS tablet  Vitamin D deficiency - Plan: Vitamin D, Ergocalciferol, (DRISDOL) 1.25 MG (50000 UT) CAPS capsule  Prediabetes - Plan: metFORMIN (GLUCOPHAGE) 500 MG tablet  At risk for diabetes mellitus  Class 3 severe obesity  with serious comorbidity and body mass index (BMI) of 45.0 to 49.9 in adult, unspecified obesity type (HCC)  PLAN:  Major Depressive Disorder Amber Bentley agrees to increase Trintellix to 20 mg PO daily #30 with no refills. Amber Bentley agrees to follow Bentley with our clinic in 1 to 2 weeks.  Vitamin D Deficiency Amber Bentley was informed that low vitamin D levels contributes to fatigue and are associated with obesity, breast, and colon cancer. Amber Bentley agrees to continue taking prescription Vit D @50 ,000 IU every week #4 and we will refill for 1 month. She will follow Bentley for routine testing of vitamin D, at least 2-3 times per year. She was informed of the risk of over-replacement of vitamin D and agrees to not increase her dose unless she discusses this with us first. Amber Bentley agrees to follow Bentley with our clinic in 1 to 2 weeks.  Pre-Diabetes Amber Bentley will continue to work on weight loss, exercise, and decreasing simple carbohydrates in her diet to help decrease the risk of diabetes. We dicussed metformin including benefits and risks. She was informed that eating too many simple carbohydrates or too many calories at one sitting increases the likelihood of GI side effects. Amber Bentley agrees to continue taking metformin 500 mg PO q AM #30 and we will refill for 1 month. Amber Bentley agrees to follow Bentley with our clinic in 1 to 2 weeks as directed to monitor her progress.  Diabetes risk counselling Amber Bentley was given extended (15 minutes) diabetes prevention counseling today. She is 43 y.o. female and has risk factors for diabetes including obesity and pre-diabetes. We discussed  intensive lifestyle modifications today with an emphasis on weight loss as well as increasing exercise and decreasing simple carbohydrates in her diet.  Obesity Amber Bentley is currently in the action stage of change. As such, her goal is to continue with weight loss efforts She has agreed to keep a food journal with 1450-1600 calories and 100+ grams of protein daily Amber Bentley has  been instructed to work Bentley to a goal of 150 minutes of combined cardio and strengthening exercise per week for weight loss and overall health benefits. We discussed the following Behavioral Modification Strategies today: increasing lean protein intake, increasing vegetables, work on meal planning and easy cooking plans, and planning for success   Amber Bentley with our clinic in 1 to 2 weeks. She was informed of the importance of frequent follow Bentley visits to maximize her success with intensive lifestyle modifications for her multiple health conditions.  ALLERGIES: Allergies  Allergen Reactions  . 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   [name] [Naproxen] Other (See Comments)    Pt states that this medication makes her loopy.     MEDICATIONS: Current Outpatient Medications on File Prior to Visit  Medication Sig Dispense Refill  . carvedilol (COREG) 6.25 MG tablet Take 1 tablet (6.25 mg total) by mouth 2 (two) times daily. 180 tablet 3  . losartan (COZAAR) 50 MG tablet Take 1 tablet (50 mg total) by mouth daily. 30 tablet 11  . pantoprazole (PROTONIX) 40 MG tablet TAKE 1 TABLET BY MOUTH TWICE DAILY 180 tablet 1  . rosuvastatin (CRESTOR) 10 MG tablet TAKE 1 TABLET BY MOUTH DAILY. 90 tablet 3  . spironolactone (ALDACTONE) 25 MG tablet Take 1 tablet (25 mg total) by mouth daily. 90 tablet 3   No current facility-administered medications on file prior to visit.     PAST MEDICAL HISTORY: Past Medical History:  Diagnosis Date  . Anemia   . Anxiety   . Cardiomegaly   . Chest pain   . CHF (congestive heart failure) (HCC)   . Constipation   . Depression   . Dyspnea   . Fatigue   . Food allergy    celery  . GERD (gastroesophageal reflux disease)   . Hip pain   . HLD (hyperlipidemia)   . HTN (hypertension)   . Infertility, female   . Lactose intolerance   . Leg edema   . Lower back pain   . OSA on CPAP   . Palpitations   .  Prediabetes   . Shoulder pain   . Stomach ulcer   . Varicose vein of leg     PAST SURGICAL HISTORY: Past Surgical History:  Procedure Laterality Date  . ABDOMINAL HYSTERECTOMY    . ENDOMETRIAL ABLATION  2007   prior to hysterectomy    SOCIAL HISTORY: Social History   Tobacco Use  . Smoking status: Former Smoker    Packs/day: 0.25    Years: 10.00    Pack years: 2.50    Types: Cigarettes  . Smokeless tobacco: Never Used  Substance Use Topics  . Alcohol use: Yes    Comment: rarely  . Drug use: No    FAMILY HISTORY: Family History  Problem Relation Age of Onset  . Hypertension Mother   . Cancer Mother        breast cancer  . Kidney disease Mother   . Depression Mother   . Obesity Mother   . Breast cancer  Mother   . Diabetes Father   . Heart disease Father 43  . Hyperlipidemia Father   . Hypertension Father   . Depression Father   . Anxiety disorder Father   . Sleep apnea Father   . Obesity Father   . Thyroid disease Sister   . Sudden death Brother   . Cancer Maternal Aunt        breast cancer  . Breast cancer Maternal Aunt        in 28's  . Cancer Paternal Grandfather        brain cancer  . Cancer Maternal Grandmother        breast  . Breast cancer Maternal Grandmother   . Breast cancer Paternal Grandmother     ROS: Review of Systems  Constitutional: Positive for malaise/fatigue. Negative for weight loss.  Gastrointestinal: Negative for nausea and vomiting.  Genitourinary: Negative for frequency.  Musculoskeletal:       Negative muscle weakness  Endo/Heme/Allergies: Negative for polydipsia.       Negative hypoglycemia  Psychiatric/Behavioral: Positive for depression. Negative for suicidal ideas.    PHYSICAL EXAM: Blood pressure (!) 152/82, pulse 69, temperature 97.7 F (36.5 C), temperature source Oral, height 5\' 7"  (1.702 m), weight (!) 311 lb (141.1 kg), SpO2 98 %. Body mass index is 48.71 kg/m. Physical Exam Vitals signs reviewed.    Constitutional:      Appearance: Normal appearance. She is obese.  Cardiovascular:     Rate and Rhythm: Normal rate.     Pulses: Normal pulses.  Pulmonary:     Effort: Pulmonary effort is normal.     Breath sounds: Normal breath sounds.  Musculoskeletal: Normal range of motion.  Skin:    General: Skin is warm and dry.  Neurological:     Mental Status: She is alert and oriented to person, place, and time.  Psychiatric:        Mood and Affect: Mood normal.        Behavior: Behavior normal.     RECENT LABS AND TESTS: BMET    Component Value Date/Time   NA 141 12/30/2017 1147   K 4.9 12/30/2017 1147   CL 102 12/30/2017 1147   CO2 23 12/30/2017 1147   GLUCOSE 108 (H) 12/30/2017 1147   GLUCOSE 113 (H) 06/20/2017 0856   BUN 17 12/30/2017 1147   CREATININE 0.87 12/30/2017 1147   CALCIUM 9.3 12/30/2017 1147   GFRNONAA 84 12/30/2017 1147   GFRAA 97 12/30/2017 1147   Lab Results  Component Value Date   HGBA1C 5.4 07/07/2018   HGBA1C 6.0 (H) 12/30/2017   HGBA1C 5.8 02/21/2017   Lab Results  Component Value Date   INSULIN 18.0 07/07/2018   INSULIN 89.7 (H) 12/30/2017   CBC    Component Value Date/Time   WBC 9.3 12/30/2017 1147   WBC 9.6 06/20/2017 0856   RBC 5.18 12/30/2017 1147   RBC 5.04 06/20/2017 0856   HGB 13.4 12/30/2017 1147   HCT 43.1 12/30/2017 1147   PLT 224.0 06/20/2017 0856   MCV 83 12/30/2017 1147   MCH 25.9 (L) 12/30/2017 1147   MCH 24.1 (L) 04/27/2017 2055   MCHC 31.1 (L) 12/30/2017 1147   MCHC 32.7 06/20/2017 0856   RDW 13.6 12/30/2017 1147   LYMPHSABS 2.0 12/30/2017 1147   MONOABS 0.6 02/21/2017 1510   EOSABS 0.2 12/30/2017 1147   BASOSABS 0.0 12/30/2017 1147   Iron/TIBC/Ferritin/ %Sat    Component Value Date/Time   IRON 85 06/20/2017 0856  TIBC 344 06/20/2017 0856   FERRITIN 55.4 06/20/2017 0856   IRONPCTSAT 25 06/20/2017 0856   Lipid Panel     Component Value Date/Time   CHOL 120 12/30/2017 1147   TRIG 65 12/30/2017 1147   HDL  45 12/30/2017 1147   CHOLHDL 3.3 04/08/2017 1124   CHOLHDL 4.7 02/22/2017 0420   VLDL 11 02/22/2017 0420   LDLCALC 62 12/30/2017 1147   Hepatic Function Panel     Component Value Date/Time   PROT 7.3 12/30/2017 1147   ALBUMIN 4.5 12/30/2017 1147   AST 24 12/30/2017 1147   ALT 28 12/30/2017 1147   ALKPHOS 70 12/30/2017 1147   BILITOT 0.3 12/30/2017 1147   BILIDIR 0.4 04/14/2017 0315   IBILI 0.7 04/14/2017 0315      Component Value Date/Time   TSH 1.270 12/30/2017 1147   TSH 3.01 06/20/2017 0856   TSH 4.78 (H) 02/21/2017 1510      OBESITY BEHAVIORAL INTERVENTION VISIT  Today's visit was # 21   Starting weight: 351 lbs Starting date: 12/30/17 Today's weight : 311 lbs  Today's date: 01/20/2019 Total lbs lost to date: 40    ASK: We discussed the diagnosis of obesity with [name] today and Keymani agreed to give [name] permission to discuss obesity behavioral modification therapy today.  ASSESS: Valor has the diagnosis of obesity and her BMI today is 48.7 Ilena is in the action stage of change   ADVISE: Alexix was educated on the multiple health risks of obesity as well as the benefit of weight loss to improve her health. She was advised of the need for long term treatment and the importance of lifestyle modifications to improve her current health and to decrease her risk of future health problems.  AGREE: Multiple dietary modification options and treatment options were discussed and  Tecora agreed to follow the recommendations documented in the above note.  ARRANGE: Evella was educated on the importance of frequent visits to treat obesity as outlined per CMS and USPSTF guidelines and agreed to schedule her next follow Bentley appointment today.  I, Burt Knack, am acting as transcriptionist for Debbra Riding, MD  I have reviewed the above documentation for accuracy and completeness, and I agree with the above. - Debbra Riding, MD

## 2019-01-28 ENCOUNTER — Encounter (INDEPENDENT_AMBULATORY_CARE_PROVIDER_SITE_OTHER): Payer: Self-pay | Admitting: Family Medicine

## 2019-01-28 ENCOUNTER — Ambulatory Visit (INDEPENDENT_AMBULATORY_CARE_PROVIDER_SITE_OTHER): Payer: 59 | Admitting: Family Medicine

## 2019-01-28 VITALS — BP 146/79 | HR 66 | Temp 98.0°F | Ht 67.0 in | Wt 313.0 lb

## 2019-01-28 DIAGNOSIS — E559 Vitamin D deficiency, unspecified: Secondary | ICD-10-CM

## 2019-01-28 DIAGNOSIS — Z6841 Body Mass Index (BMI) 40.0 and over, adult: Secondary | ICD-10-CM

## 2019-01-28 DIAGNOSIS — F329 Major depressive disorder, single episode, unspecified: Secondary | ICD-10-CM

## 2019-01-28 DIAGNOSIS — Z9189 Other specified personal risk factors, not elsewhere classified: Secondary | ICD-10-CM | POA: Diagnosis not present

## 2019-01-28 MED ORDER — VORTIOXETINE HBR 10 MG PO TABS: 10.0000 mg | ORAL_TABLET | Freq: Every day | ORAL | 0 refills | Status: AC

## 2019-01-29 DIAGNOSIS — I5032 Chronic diastolic (congestive) heart failure: Secondary | ICD-10-CM | POA: Diagnosis not present

## 2019-01-29 DIAGNOSIS — I159 Secondary hypertension, unspecified: Secondary | ICD-10-CM | POA: Diagnosis not present

## 2019-01-29 DIAGNOSIS — I5041 Acute combined systolic (congestive) and diastolic (congestive) heart failure: Secondary | ICD-10-CM | POA: Diagnosis not present

## 2019-01-29 NOTE — Progress Notes (Signed)
Office: [phone]  /  Fax: [phone]   HPI:   Chief Complaint: OBESITY Amber Bentley is here to discuss her progress with her obesity treatment plan. She is on the keep a food journal with 1450-1600 calories and 100+ grams of protein daily and is following her eating plan approximately 75 % of the time. She states she is exercising 0 minutes 0 times per week. Amber Bentley returned to our clinic after 1 week for follow up. She is doing much better in terms of mood.  Her weight is (!) 313 lb (142 kg) today and has gained 2 pounds since her last visit. She has lost 38 lbs since starting treatment with [name].  Vitamin D Deficiency Amber Bentley has a diagnosis of vitamin D deficiency. She is currently taking prescription Vit D. She notes fatigue and denies nausea, vomiting or muscle weakness.  At risk for osteopenia and osteoporosis Amber Bentley is at higher risk of osteopenia and osteoporosis due to vitamin D deficiency.   Major Depressive Disorder Amber Bentley notes significant improvement in symptoms. She denies suicidal ideas or homicidal ideas.  ASSESSMENT AND PLAN:  Vitamin D deficiency  Major depressive disorder, remission status unspecified, unspecified whether recurrent - Plan: vortioxetine HBr (TRINTELLIX) 10 MG TABS tablet  At risk for osteoporosis  Class 3 severe obesity with serious comorbidity and body mass index (BMI) of 45.0 to 49.9 in adult, unspecified obesity type (HCC)  PLAN:  Vitamin D Deficiency Amber Bentley was informed that low vitamin D levels contributes to fatigue and are associated with obesity, breast, and colon cancer. Amber Bentley agrees to continue taking prescription Vit D @50 ,000 IU every week, no refill needed. She will follow up for routine testing of vitamin D, at least 2-3 times per year. She was informed of the risk of over-replacement of vitamin D and agrees to not increase her dose unless she discusses this with [name] first. Amber Bentley agrees to follow up with our clinic in 2 weeks.  At risk for  osteopenia and osteoporosis Amber Bentley was given extended (15 minutes) osteoporosis prevention counseling today. Amber Bentley is at risk for osteopenia and osteoporsis due to her vitamin D deficiency. She was encouraged to take her vitamin D and follow her higher calcium diet and increase strengthening exercise to help strengthen her bones and decrease her risk of osteopenia and osteoporosis.  Major Depressive Disorder Amber Bentley agrees to continue taking Trintellix 10 mg PO q daily #30 and we will refill for 1 month. Amber Bentley agrees to follow up with our clinic in 2 weeks.  Obesity Amber Bentley is currently in the action stage of change. As such, her goal is to continue with weight loss efforts She has agreed to keep a food journal with 1450-1600 calories and 100+ grams of protein daily Amber Bentley has been instructed to work up to a goal of 150 minutes of combined cardio and strengthening exercise per week for weight loss and overall health benefits. We discussed the following Behavioral Modification Strategies today: increasing lean protein intake, increasing vegetables and work on meal planning and easy cooking plans, and planning for success   Amber Bentley has agreed to follow up with our clinic in 2 weeks. She was informed of the importance of frequent follow up visits to maximize her success with intensive lifestyle modifications for her multiple health conditions.  ALLERGIES: Allergies  Allergen Reactions  . 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   Amber Bentley [Naproxen] Other (See Comments)    Pt states that this medication  makes her loopy.     MEDICATIONS: Current Outpatient Medications on File Prior to Visit  Medication Sig Dispense Refill  . carvedilol (COREG) 6.25 MG tablet Take 1 tablet (6.25 mg total) by mouth 2 (two) times daily. 180 tablet 3  . losartan (COZAAR) 50 MG tablet Take 1 tablet (50 mg total) by mouth daily. 30 tablet 11  . metFORMIN (GLUCOPHAGE) 500 MG tablet TAKE 1  TABLET BY MOUTH ONCE DAILY WITH BREAKFAST 30 tablet 0  . pantoprazole (PROTONIX) 40 MG tablet TAKE 1 TABLET BY MOUTH TWICE DAILY 180 tablet 1  . rosuvastatin (CRESTOR) 10 MG tablet TAKE 1 TABLET BY MOUTH DAILY. 90 tablet 3  . spironolactone (ALDACTONE) 25 MG tablet Take 1 tablet (25 mg total) by mouth daily. 90 tablet 3  . Vitamin D, Ergocalciferol, (DRISDOL) 1.25 MG (50000 UT) CAPS capsule Take 1 capsule (50,000 Units total) by mouth every 7 (seven) days. 4 capsule 0   No current facility-administered medications on file prior to visit.     PAST MEDICAL HISTORY: Past Medical History:  Diagnosis Date  . Anemia   . Anxiety   . Cardiomegaly   . Chest pain   . CHF (congestive heart failure) (HCC)   . Constipation   . Depression   . Dyspnea   . Fatigue   . Food allergy    celery  . GERD (gastroesophageal reflux disease)   . Hip pain   . HLD (hyperlipidemia)   . HTN (hypertension)   . Infertility, female   . Lactose intolerance   . Leg edema   . Lower back pain   . OSA on CPAP   . Palpitations   . Prediabetes   . Shoulder pain   . Stomach ulcer   . Varicose vein of leg     PAST SURGICAL HISTORY: Past Surgical History:  Procedure Laterality Date  . ABDOMINAL HYSTERECTOMY    . ENDOMETRIAL ABLATION  2007   prior to hysterectomy    SOCIAL HISTORY: Social History   Tobacco Use  . Smoking status: Former Smoker    Packs/day: 0.25    Years: 10.00    Pack years: 2.50    Types: Cigarettes  . Smokeless tobacco: Never Used  Substance Use Topics  . Alcohol use: Yes    Comment: rarely  . Drug use: No    FAMILY HISTORY: Family History  Problem Relation Age of Onset  . Hypertension Mother   . Cancer Mother        breast cancer  . Kidney disease Mother   . Depression Mother   . Obesity Mother   . Breast cancer Mother   . Diabetes Father   . Heart disease Father 35  . Hyperlipidemia Father   . Hypertension Father   . Depression Father   . Anxiety disorder Father    . Sleep apnea Father   . Obesity Father   . Thyroid disease Sister   . Sudden death Brother   . Cancer Maternal Aunt        breast cancer  . Breast cancer Maternal Aunt        in 64's  . Cancer Paternal Grandfather        brain cancer  . Cancer Maternal Grandmother        breast  . Breast cancer Maternal Grandmother   . Breast cancer Paternal Grandmother     ROS: Review of Systems  Constitutional: Positive for malaise/fatigue. Negative for weight loss.  Gastrointestinal: Negative for  nausea and vomiting.  Musculoskeletal:       Negative muscle weakness  Psychiatric/Behavioral: Positive for depression. Negative for suicidal ideas.    PHYSICAL EXAM: Blood pressure (!) 146/79, pulse 66, temperature 98 F (36.7 C), temperature source Oral, height 5\' 7"  (1.702 m), weight (!) 313 lb (142 kg), SpO2 97 %. Body mass index is 49.02 kg/m. Physical Exam Vitals signs reviewed.  Constitutional:      Appearance: Normal appearance. She is obese.  Cardiovascular:     Rate and Rhythm: Normal rate.     Pulses: Normal pulses.  Pulmonary:     Effort: Pulmonary effort is normal.     Breath sounds: Normal breath sounds.  Musculoskeletal: Normal range of motion.  Skin:    General: Skin is warm and dry.  Neurological:     Mental Status: She is alert and oriented to person, place, and time.  Psychiatric:        Mood and Affect: Mood normal.        Behavior: Behavior normal.     RECENT LABS AND TESTS: BMET    Component Value Date/Time   NA 141 12/30/2017 1147   K 4.9 12/30/2017 1147   CL 102 12/30/2017 1147   CO2 23 12/30/2017 1147   GLUCOSE 108 (H) 12/30/2017 1147   GLUCOSE 113 (H) 06/20/2017 0856   BUN 17 12/30/2017 1147   CREATININE 0.87 12/30/2017 1147   CALCIUM 9.3 12/30/2017 1147   GFRNONAA 84 12/30/2017 1147   GFRAA 97 12/30/2017 1147   Lab Results  Component Value Date   HGBA1C 5.4 07/07/2018   HGBA1C 6.0 (H) 12/30/2017   HGBA1C 5.8 02/21/2017   Lab Results    Component Value Date   INSULIN 18.0 07/07/2018   INSULIN 89.7 (H) 12/30/2017   CBC    Component Value Date/Time   WBC 9.3 12/30/2017 1147   WBC 9.6 06/20/2017 0856   RBC 5.18 12/30/2017 1147   RBC 5.04 06/20/2017 0856   HGB 13.4 12/30/2017 1147   HCT 43.1 12/30/2017 1147   PLT 224.0 06/20/2017 0856   MCV 83 12/30/2017 1147   MCH 25.9 (L) 12/30/2017 1147   MCH 24.1 (L) 04/27/2017 2055   MCHC 31.1 (L) 12/30/2017 1147   MCHC 32.7 06/20/2017 0856   RDW 13.6 12/30/2017 1147   LYMPHSABS 2.0 12/30/2017 1147   MONOABS 0.6 02/21/2017 1510   EOSABS 0.2 12/30/2017 1147   BASOSABS 0.0 12/30/2017 1147   Iron/TIBC/Ferritin/ %Sat    Component Value Date/Time   IRON 85 06/20/2017 0856   TIBC 344 06/20/2017 0856   FERRITIN 55.4 06/20/2017 0856   IRONPCTSAT 25 06/20/2017 0856   Lipid Panel     Component Value Date/Time   CHOL 120 12/30/2017 1147   TRIG 65 12/30/2017 1147   HDL 45 12/30/2017 1147   CHOLHDL 3.3 04/08/2017 1124   CHOLHDL 4.7 02/22/2017 0420   VLDL 11 02/22/2017 0420   LDLCALC 62 12/30/2017 1147   Hepatic Function Panel     Component Value Date/Time   PROT 7.3 12/30/2017 1147   ALBUMIN 4.5 12/30/2017 1147   AST 24 12/30/2017 1147   ALT 28 12/30/2017 1147   ALKPHOS 70 12/30/2017 1147   BILITOT 0.3 12/30/2017 1147   BILIDIR 0.4 04/14/2017 0315   IBILI 0.7 04/14/2017 0315      Component Value Date/Time   TSH 1.270 12/30/2017 1147   TSH 3.01 06/20/2017 0856   TSH 4.78 (H) 02/21/2017 1510      OBESITY BEHAVIORAL  INTERVENTION VISIT  Today's visit was # 22  Starting weight: 351 lbs Starting date: 12/30/17 Today's weight : 313 lbs  Today's date: 01/28/2019 Total lbs lost to date: 2838    ASK: We discussed the diagnosis of obesity with Amber Bentley today and Amber Bentley agreed to give us permission to discuss obesity behavioral modification therapy today.  ASSESS: Amber Bentley has the diagnosis of obesity and her BMI today is 49.01 Amber Bentley is in the action  stage of change   ADVISE: Amber Bentley was educated on the multiple health risks of obesity as well as the benefit of weight loss to improve her health. She was advised of the need for long term treatment and the importance of lifestyle modifications to improve her current health and to decrease her risk of future health problems.  AGREE: Multiple dietary modification options and treatment options were discussed and  Chasya agreed to follow the recommendations documented in the above note.  ARRANGE: Amber Bentley was educated on the importance of frequent visits to treat obesity as outlined per CMS and USPSTF guidelines and agreed to schedule her next follow up appointment today.  I, Burt KnackSharon Martin, am acting as transcriptionist for Debbra RidingAlexandria Kadolph, MD  I have reviewed the above documentation for accuracy and completeness, and I agree with the above. - Debbra RidingAlexandria Kadolph, MD

## 2019-02-01 ENCOUNTER — Encounter (INDEPENDENT_AMBULATORY_CARE_PROVIDER_SITE_OTHER): Payer: Self-pay | Admitting: Family Medicine

## 2019-02-12 ENCOUNTER — Ambulatory Visit (INDEPENDENT_AMBULATORY_CARE_PROVIDER_SITE_OTHER): Payer: 59 | Admitting: Family Medicine

## 2019-02-12 ENCOUNTER — Encounter (INDEPENDENT_AMBULATORY_CARE_PROVIDER_SITE_OTHER): Payer: Self-pay | Admitting: Family Medicine

## 2019-02-12 VITALS — BP 170/92 | HR 62 | Temp 98.1°F | Ht 67.0 in | Wt 315.0 lb

## 2019-02-12 DIAGNOSIS — E559 Vitamin D deficiency, unspecified: Secondary | ICD-10-CM

## 2019-02-12 DIAGNOSIS — F3289 Other specified depressive episodes: Secondary | ICD-10-CM

## 2019-02-12 DIAGNOSIS — I10 Essential (primary) hypertension: Secondary | ICD-10-CM

## 2019-02-12 DIAGNOSIS — Z6841 Body Mass Index (BMI) 40.0 and over, adult: Secondary | ICD-10-CM | POA: Diagnosis not present

## 2019-02-12 DIAGNOSIS — Z9189 Other specified personal risk factors, not elsewhere classified: Secondary | ICD-10-CM

## 2019-02-12 MED ORDER — VITAMIN D (ERGOCALCIFEROL) 1.25 MG (50000 UNIT) PO CAPS: 50000.0000 [IU] | ORAL_CAPSULE | ORAL | 0 refills | Status: DC

## 2019-02-15 NOTE — Progress Notes (Signed)
Office: [phone]  /  Fax: [phone]   HPI:   Chief Complaint: OBESITY Amber Bentley is here to discuss her progress with her obesity treatment plan. She is on the keep a food journal with 1450-1600 calories and 100+ grams of protein daily and is following her eating plan approximately 80 % of the time. She states she is exercising 0 minutes 0 times per week. [name] continues to struggle with emotional eating. She is working on meal planning but is not journaling often. She notes increased carbohydrate cravings especially worse in the last 2 weeks.  Her weight is (!) 315 lb (142.9 kg) today and has gained 2 pounds since her last visit. She has lost 36 lbs since starting treatment with [name].  Vitamin D Deficiency [name] has a diagnosis of vitamin D deficiency. She is stable on prescription Vit D, but level is not yet at goal. She denies nausea, vomiting or muscle weakness.  Hypertension [name] is a 43 y.o. female with hypertension. [name]'s blood pressure is elevated today. She states she is feeling stressed. She is taking her medications but has gained weight. She denies chest pain. She is working on weight loss to help control her blood pressure with the goal of decreasing her risk of heart attack and stroke. [name]'s blood pressure is not currently controlled.  At risk for cardiovascular disease [name] is at a higher than average risk for cardiovascular disease due to obesity and hypertension. She currently denies any chest pain.  Depression with emotional eating behaviors [name]'s Trintellix dose was increased, but she didn't increase due to sexual side effect concerns. She feels the medications is helping, but is still struggling with emotional eating and comfort eating to the extent that it is negatively impacting her health. She often snacks when she is not hungry. [name] sometimes feels she is out of control and then feels guilty that she made poor food choices. She has been working  on behavior modification techniques to help reduce her emotional eating and has been somewhat successful. She shows no sign of suicidal or homicidal ideations.  Depression screen [name] 2/9 09/16/2018 12/30/2017 05/14/2017 02/21/2017  Decreased Interest 0 3 0 0  Down, Depressed, Hopeless 0 3 0 0  PHQ - 2 Score 0 6 0 0  Altered sleeping - 3 - -  Tired, decreased energy - 3 - -  Change in appetite - 3 - -  Feeling bad or failure about yourself  - 3 - -  Trouble concentrating - 3 - -  Moving slowly or fidgety/restless - 3 - -  Suicidal thoughts - 3 - -  PHQ-9 Score - 27 - -  Difficult doing work/chores - Somewhat difficult - -    ASSESSMENT AND PLAN:  Vitamin D deficiency - Plan: Vitamin D, Ergocalciferol, (DRISDOL) 1.25 MG (50000 UT) CAPS capsule  Essential hypertension  Other depression  At risk for heart disease  Class 3 severe obesity with serious comorbidity and body mass index (BMI) of 45.0 to 49.9 in adult, unspecified obesity type (HCC)  PLAN:  Vitamin D Deficiency [name] was informed that low vitamin D levels contributes to fatigue and are associated with obesity, breast, and colon cancer. [name] agrees to continue taking prescription Vit D ,000 IU every week #4 and we will refill for 1 month. She will follow up for routine testing of vitamin D, at least 2-3 times per year. She was informed of the risk of over-replacement of vitamin D and agrees to not increase  her dose unless she discusses this with [name] first. [name] agrees to follow up with our clinic in 2 to 3 weeks with Dr. Rinaldo Ratel.  Hypertension We discussed sodium restriction, working on healthy weight loss, and a regular exercise program as the means to achieve improved blood pressure control. [name] agreed with this plan and agreed to follow up as directed. We will continue to monitor her blood pressure as well as her progress with the above lifestyle modifications. [name] is to continue her medications and get back to weight  loss. Her medications may need to be increased if no improvement in 2 to 3 weeks. She will watch for signs of hypotension as she continues her lifestyle modifications. [name] agrees to follow up with our clinic in 2 to 3 weeks with Dr. Rinaldo Ratel.  Cardiovascular risk counseling [name] was given extended (15 minutes) coronary artery disease prevention counseling today. She is 43 y.o. female and has risk factors for heart disease including obesity and hypertension. We discussed intensive lifestyle modifications today with an emphasis on specific weight loss instructions and strategies. Pt was also informed of the importance of increasing exercise and decreasing saturated fats to help prevent heart disease.  Depression with Emotional Eating Behaviors [name] was encouraged to increase her dose of Trintellix and we discussed cognitive behavioral therapy to help decrease emotional eating and depression. [name] agrees to follow up with our clinic in 2 to 3 weeks with Dr. Rinaldo Ratel.  Obesity [name] is currently in the action stage of change. As such, her goal is to continue with weight loss efforts She has agreed to keep a food journal with 1400-1500 calories and 100 grams of protein daily [name] has been instructed to work up to a goal of 150 minutes of combined cardio and strengthening exercise per week for weight loss and overall health benefits. We discussed the following Behavioral Modification Strategies today: emotional eating strategies, ways to avoid night time snacking, better snacking choices, and keep a strict food journal  [name] has agreed to follow up with our clinic in 2 to 3 weeks with Dr. Rinaldo Ratel. She was informed of the importance of frequent follow up visits to maximize her success with intensive lifestyle modifications for her multiple health conditions.  ALLERGIES: Allergies  Allergen Reactions  . 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   [name] [Naproxen]  Other (See Comments)    Pt states that this medication makes her loopy.     MEDICATIONS: Current Outpatient Medications on File Prior to Visit  Medication Sig Dispense Refill  . carvedilol (COREG) 6.25 MG tablet Take 1 tablet (6.25 mg total) by mouth 2 (two) times daily. 180 tablet 3  . losartan (COZAAR) 50 MG tablet Take 1 tablet (50 mg total) by mouth daily. 30 tablet 11  . metFORMIN (GLUCOPHAGE) 500 MG tablet TAKE 1 TABLET BY MOUTH ONCE DAILY WITH BREAKFAST 30 tablet 0  . pantoprazole (PROTONIX) 40 MG tablet TAKE 1 TABLET BY MOUTH TWICE DAILY 180 tablet 1  . rosuvastatin (CRESTOR) 10 MG tablet TAKE 1 TABLET BY MOUTH DAILY. 90 tablet 3  . spironolactone (ALDACTONE) 25 MG tablet Take 1 tablet (25 mg total) by mouth daily. 90 tablet 3  . vortioxetine HBr (TRINTELLIX) 10 MG TABS tablet Take 1 tablet (10 mg total) by mouth daily for 30 days. 30 tablet 0   No current facility-administered medications on file prior to visit.     PAST MEDICAL HISTORY: Past Medical History:  Diagnosis Date  . Anemia   . Anxiety   . Cardiomegaly   . Chest pain   . CHF (congestive heart failure) (HCC)   . Constipation   . Depression   . Dyspnea   . Fatigue   . Food allergy    celery  . GERD (gastroesophageal reflux disease)   . Hip pain   . HLD (hyperlipidemia)   . HTN (hypertension)   . Infertility, female   . Lactose intolerance   . Leg edema   . Lower back pain   . OSA on CPAP   . Palpitations   . Prediabetes   . Shoulder pain   . Stomach ulcer   . Varicose vein of leg     PAST SURGICAL HISTORY: Past Surgical History:  Procedure Laterality Date  . ABDOMINAL HYSTERECTOMY    . ENDOMETRIAL ABLATION  2007   prior to hysterectomy    SOCIAL HISTORY: Social History   Tobacco Use  . Smoking status: Former Smoker    Packs/day: 0.25    Years: 10.00    Pack years: 2.50    Types: Cigarettes  . Smokeless tobacco: Never Used  Substance Use Topics  . Alcohol use: Yes    Comment: rarely   . Drug use: No    FAMILY HISTORY: Family History  Problem Relation Age of Onset  . Hypertension Mother   . Cancer Mother        breast cancer  . Kidney disease Mother   . Depression Mother   . Obesity Mother   . Breast cancer Mother   . Diabetes Father   . Heart disease Father 75  . Hyperlipidemia Father   . Hypertension Father   . Depression Father   . Anxiety disorder Father   . Sleep apnea Father   . Obesity Father   . Thyroid disease Sister   . Sudden death Brother   . Cancer Maternal Aunt        breast cancer  . Breast cancer Maternal Aunt        in 49's  . Cancer Paternal Grandfather        brain cancer  . Cancer Maternal Grandmother        breast  . Breast cancer Maternal Grandmother   . Breast cancer Paternal Grandmother     ROS: Review of Systems  Constitutional: Negative for weight loss.  Cardiovascular: Negative for chest pain.  Gastrointestinal: Negative for nausea and vomiting.  Musculoskeletal:       Negative muscle weakness  Psychiatric/Behavioral: Positive for depression. Negative for suicidal ideas.       + Stress    PHYSICAL EXAM: Blood pressure (!) 170/92, pulse 62, temperature 98.1 F (36.7 C), temperature source Oral, height 5\' 7"  (1.702 m), weight (!) 315 lb (142.9 kg), SpO2 98 %. Body mass index is 49.34 kg/m. Physical Exam Vitals signs reviewed.  Constitutional:      Appearance: Normal appearance. She is obese.  Cardiovascular:     Rate and Rhythm: Normal rate.     Pulses: Normal pulses.  Pulmonary:     Effort: Pulmonary effort is normal.     Breath sounds: Normal breath sounds.  Musculoskeletal: Normal range of motion.  Skin:    General: Skin is warm and dry.  Neurological:     Mental Status: She is alert and oriented to person, place, and time.  Psychiatric:        Mood and Affect: Mood normal.  Behavior: Behavior normal.     RECENT LABS AND TESTS: BMET    Component Value Date/Time   NA 141 12/30/2017 1147     K 4.9 12/30/2017 1147   CL 102 12/30/2017 1147   CO2 23 12/30/2017 1147   GLUCOSE 108 (H) 12/30/2017 1147   GLUCOSE 113 (H) 06/20/2017 0856   BUN 17 12/30/2017 1147   CREATININE 0.87 12/30/2017 1147   CALCIUM 9.3 12/30/2017 1147   GFRNONAA 84 12/30/2017 1147   GFRAA 97 12/30/2017 1147   Lab Results  Component Value Date   HGBA1C 5.4 07/07/2018   HGBA1C 6.0 (H) 12/30/2017   HGBA1C 5.8 02/21/2017   Lab Results  Component Value Date   INSULIN 18.0 07/07/2018   INSULIN 89.7 (H) 12/30/2017   CBC    Component Value Date/Time   WBC 9.3 12/30/2017 1147   WBC 9.6 06/20/2017 0856   RBC 5.18 12/30/2017 1147   RBC 5.04 06/20/2017 0856   HGB 13.4 12/30/2017 1147   HCT 43.1 12/30/2017 1147   PLT 224.0 06/20/2017 0856   MCV 83 12/30/2017 1147   MCH 25.9 (L) 12/30/2017 1147   MCH 24.1 (L) 04/27/2017 2055   MCHC 31.1 (L) 12/30/2017 1147   MCHC 32.7 06/20/2017 0856   RDW 13.6 12/30/2017 1147   LYMPHSABS 2.0 12/30/2017 1147   MONOABS 0.6 02/21/2017 1510   EOSABS 0.2 12/30/2017 1147   BASOSABS 0.0 12/30/2017 1147   Iron/TIBC/Ferritin/ %Sat    Component Value Date/Time   IRON 85 06/20/2017 0856   TIBC 344 06/20/2017 0856   FERRITIN 55.4 06/20/2017 0856   IRONPCTSAT 25 06/20/2017 0856   Lipid Panel     Component Value Date/Time   CHOL 120 12/30/2017 1147   TRIG 65 12/30/2017 1147   HDL 45 12/30/2017 1147   CHOLHDL 3.3 04/08/2017 1124   CHOLHDL 4.7 02/22/2017 0420   VLDL 11 02/22/2017 0420   LDLCALC 62 12/30/2017 1147   Hepatic Function Panel     Component Value Date/Time   PROT 7.3 12/30/2017 1147   ALBUMIN 4.5 12/30/2017 1147   AST 24 12/30/2017 1147   ALT 28 12/30/2017 1147   ALKPHOS 70 12/30/2017 1147   BILITOT 0.3 12/30/2017 1147   BILIDIR 0.4 04/14/2017 0315   IBILI 0.7 04/14/2017 0315      Component Value Date/Time   TSH 1.270 12/30/2017 1147   TSH 3.01 06/20/2017 0856   TSH 4.78 (H) 02/21/2017 1510      OBESITY BEHAVIORAL INTERVENTION  VISIT  Today's visit was # 23   Starting weight: 351 lbs Starting date: 12/30/17 Today's weight : 315 lbs  Today's date: 02/12/2019 Total lbs lost to date: 36    02/12/2019  Height 5\' 7"  (1.702 m)  Weight 315 lb (142.9 kg) (A)  BMI (Calculated) 49.32  BLOOD PRESSURE - SYSTOLIC 170  BLOOD PRESSURE - DIASTOLIC 92   Body Fat % 54.4 %     ASK: We discussed the diagnosis of obesity with [name] today and Kaydynce agreed to give [name] permission to discuss obesity behavioral modification therapy today.  ASSESS: Varnika has the diagnosis of obesity and her BMI today is 49.32 Ashtan is in the action stage of change   ADVISE: Yasaman was educated on the multiple health risks of obesity as well as the benefit of weight loss to improve her health. She was advised of the need for long term treatment and the importance of lifestyle modifications to improve her current health and to decrease  her risk of future health problems.  AGREE: Multiple dietary modification options and treatment options were discussed and  Dimonique agreed to follow the recommendations documented in the above note.  ARRANGE: Reham was educated on the importance of frequent visits to treat obesity as outlined per CMS and USPSTF guidelines and agreed to schedule her next follow up appointment today.  I, Burt Knack, am acting as transcriptionist for Quillian Quince, MD  I have reviewed the above documentation for accuracy and completeness, and I agree with the above. -Quillian Quince, MD

## 2019-02-23 MED FILL — LOSARTAN POTASSIUM 50 MG TA: 50 | 30 days supply | Qty: 30 | Fill #2

## 2019-02-27 DIAGNOSIS — I159 Secondary hypertension, unspecified: Secondary | ICD-10-CM | POA: Diagnosis not present

## 2019-02-27 DIAGNOSIS — I5032 Chronic diastolic (congestive) heart failure: Secondary | ICD-10-CM | POA: Diagnosis not present

## 2019-02-27 DIAGNOSIS — I5041 Acute combined systolic (congestive) and diastolic (congestive) heart failure: Secondary | ICD-10-CM | POA: Diagnosis not present

## 2019-02-27 MED FILL — VIT D2 1.25 MG (50,000 UNIT: 1.25 MG | 28 days supply | Qty: 4 | Fill #0

## 2019-03-05 ENCOUNTER — Ambulatory Visit (INDEPENDENT_AMBULATORY_CARE_PROVIDER_SITE_OTHER): Payer: 59 | Admitting: Family Medicine

## 2019-03-09 ENCOUNTER — Other Ambulatory Visit: Payer: Self-pay

## 2019-03-09 ENCOUNTER — Encounter (INDEPENDENT_AMBULATORY_CARE_PROVIDER_SITE_OTHER): Payer: Self-pay | Admitting: Family Medicine

## 2019-03-09 ENCOUNTER — Ambulatory Visit (INDEPENDENT_AMBULATORY_CARE_PROVIDER_SITE_OTHER): Payer: 59 | Admitting: Family Medicine

## 2019-03-09 VITALS — BP 162/80 | HR 84 | Temp 98.2°F | Ht 67.0 in | Wt 314.0 lb

## 2019-03-09 DIAGNOSIS — E559 Vitamin D deficiency, unspecified: Secondary | ICD-10-CM

## 2019-03-09 DIAGNOSIS — Z6841 Body Mass Index (BMI) 40.0 and over, adult: Secondary | ICD-10-CM

## 2019-03-09 DIAGNOSIS — R7303 Prediabetes: Secondary | ICD-10-CM

## 2019-03-09 DIAGNOSIS — Z9189 Other specified personal risk factors, not elsewhere classified: Secondary | ICD-10-CM

## 2019-03-09 DIAGNOSIS — E66813 Obesity, class 3: Secondary | ICD-10-CM

## 2019-03-09 MED ORDER — METFORMIN HCL 500 MG PO TABS: ORAL_TABLET | ORAL | 0 refills | Status: DC

## 2019-03-09 MED FILL — metFORMIN HCL 500 MG TABS: 500 | 90 days supply | Qty: 90 | Fill #0

## 2019-03-09 NOTE — Progress Notes (Signed)
Office: [phone]  /  Fax: [phone]   HPI:   Chief Complaint: OBESITY Amber Bentley is here to discuss her progress with her obesity treatment plan. She is on the keep a food journal with 1400-1500 calories and 100 grams of protein daily and is following her eating plan approximately 60-70 % of the time. She states she is lifting weights for 15-30 minutes 4-5 times per week. Amber Bentley is weighing heavily on whether or not she will be working in the next few weeks secondary to COVID-19. She has a good amount of chicken and seafood in the freezer at home.  Her weight is (!) 314 lb (142.4 kg) today and has had a weight loss of 1 pound over a period of 3 to 4 weeks since her last visit. She has lost 37 lbs since starting treatment with [name].  Vitamin D Deficiency Amber Bentley has a diagnosis of vitamin D deficiency. She is currently taking prescription Vit D. She notes fatigue and denies nausea, vomiting or muscle weakness.  Pre-Diabetes Amber Bentley has a diagnosis of pre-diabetes based on her elevated Hgb A1c and was informed this puts her at greater risk of developing diabetes. She notes carbohydrate cravings and stress eating. She denies GI side effects of metformin. She continues to work on diet and exercise to decrease risk of diabetes. She denies hypoglycemia.  At risk for diabetes Amber Bentley is at higher than average risk for developing diabetes due to her obesity and pre-diabetes. She currently denies polyuria or polydipsia.  ASSESSMENT AND PLAN:  Vitamin D deficiency  Prediabetes - Plan: metFORMIN (GLUCOPHAGE) 500 MG tablet  At risk for diabetes mellitus  Class 3 severe obesity with serious comorbidity and body mass index (BMI) of 45.0 to 49.9 in adult, unspecified obesity type (HCC)  PLAN:  Vitamin D Deficiency Amber Bentley was informed that low vitamin D levels contributes to fatigue and are associated with obesity, breast, and colon cancer. Marg agrees to continue taking prescription Vit D ,000 IU  every week, no refill needed. She will follow up for routine testing of vitamin D, at least 2-3 times per year. She was informed of the risk of over-replacement of vitamin D and agrees to not increase her dose unless she discusses this with [name] first. Amber Bentley agrees to follow up with our clinic in 2 weeks.  Pre-Diabetes Amber Bentley will continue to work on weight loss, exercise, and decreasing simple carbohydrates in her diet to help decrease the risk of diabetes. We dicussed metformin including benefits and risks. She was informed that eating too many simple carbohydrates or too many calories at one sitting increases the likelihood of GI side effects. Aliea agrees to continue taking metformin 500 mg PO q AM #90, with no refills. Amber Bentley agrees to follow up with our clinic in 2 weeks as directed to monitor her progress.  Diabetes risk counseling Amber Bentley was given extended (15 minutes) diabetes prevention counseling today. She is 43 y.o. female and has risk factors for diabetes including obesity and pre-diabetes. We discussed intensive lifestyle modifications today with an emphasis on weight loss as well as increasing exercise and decreasing simple carbohydrates in her diet.  Obesity Amber Bentley is currently in the action stage of change. As such, her goal is to continue with weight loss efforts She has agreed to keep a food journal with 1400-1500 calories and 100 grams of protein daily Amber Bentley has been instructed to work up to a goal of 150 minutes of combined cardio and strengthening exercise per week for weight loss  and overall health benefits. We discussed the following Behavioral Modification Strategies today: increasing lean protein intake, work on meal planning and easy cooking plans, planning for success, and keep a strict food journal   Amber Bentley has agreed to follow up with our clinic in 2 weeks. She was informed of the importance of frequent follow up visits to maximize her success with intensive lifestyle  modifications for her multiple health conditions.  ALLERGIES: Allergies  Allergen Reactions   Food Anaphylaxis and Other (See Comments)    Pt is allergic to celery.    Aleve [Naproxen] Other (See Comments)    Pt states that this medication makes her loopy.     MEDICATIONS: Current Outpatient Medications on File Prior to Visit  Medication Sig Dispense Refill   carvedilol (COREG) 6.25 MG tablet Take 1 tablet (6.25 mg total) by mouth 2 (two) times daily. 180 tablet 3   losartan (COZAAR) 50 MG tablet Take 1 tablet (50 mg total) by mouth daily. 30 tablet 11   pantoprazole (PROTONIX) 40 MG tablet TAKE 1 TABLET BY MOUTH TWICE DAILY 180 tablet 1   rosuvastatin (CRESTOR) 10 MG tablet TAKE 1 TABLET BY MOUTH DAILY. 90 tablet 3   spironolactone (ALDACTONE) 25 MG tablet Take 1 tablet (25 mg total) by mouth daily. 90 tablet 3   Vitamin D, Ergocalciferol, (DRISDOL) 1.25 MG (50000 UT) CAPS capsule Take 1 capsule (50,000 Units total) by mouth every 7 (seven) days. 4 capsule 0   No current facility-administered medications on file prior to visit.     PAST MEDICAL HISTORY: Past Medical History:  Diagnosis Date   Anemia    Anxiety    Cardiomegaly    Chest pain    CHF (congestive heart failure) (HCC)    Constipation    Depression    Dyspnea    Fatigue    Food allergy    celery   GERD (gastroesophageal reflux disease)    Hip pain    HLD (hyperlipidemia)    HTN (hypertension)    Infertility, female    Lactose intolerance    Leg edema    Lower back pain    OSA on CPAP    Palpitations    Prediabetes    Shoulder pain    Stomach ulcer    Varicose vein of leg     PAST SURGICAL HISTORY: Past Surgical History:  Procedure Laterality Date   ABDOMINAL HYSTERECTOMY     ENDOMETRIAL ABLATION  2007   prior to hysterectomy    SOCIAL HISTORY: Social History   Tobacco Use   Smoking status: Former Smoker    Packs/day: 0.25    Years: 10.00    Pack years:  2.50    Types: Cigarettes   Smokeless tobacco: Never Used  Substance Use Topics   Alcohol use: Yes    Comment: rarely   Drug use: No    FAMILY HISTORY: Family History  Problem Relation Age of Onset   Hypertension Mother    Cancer Mother        breast cancer   Kidney disease Mother    Depression Mother    Obesity Mother    Breast cancer Mother    Diabetes Father    Heart disease Father 22   Hyperlipidemia Father    Hypertension Father    Depression Father    Anxiety disorder Father    Sleep apnea Father    Obesity Father    Thyroid disease Sister    Sudden death Brother  Cancer Maternal Aunt        breast cancer   Breast cancer Maternal Aunt        in 53's   Cancer Paternal Grandfather        brain cancer   Cancer Maternal Grandmother        breast   Breast cancer Maternal Grandmother    Breast cancer Paternal Grandmother     ROS: Review of Systems  Constitutional: Positive for malaise/fatigue and weight loss.  Gastrointestinal: Negative for nausea and vomiting.  Genitourinary: Negative for frequency.  Musculoskeletal:       Negative muscle weakness  Endo/Heme/Allergies: Negative for polydipsia.       Negative hypoglycemia    PHYSICAL EXAM: Blood pressure (!) 162/80, pulse 84, temperature 98.2 F (36.8 C), temperature source Oral, height 5\' 7"  (1.702 m), weight (!) 314 lb (142.4 kg), SpO2 97 %. Body mass index is 49.18 kg/m. Physical Exam Vitals signs reviewed.  Constitutional:      Appearance: Normal appearance. She is obese.  Cardiovascular:     Rate and Rhythm: Normal rate.     Pulses: Normal pulses.  Pulmonary:     Effort: Pulmonary effort is normal.     Breath sounds: Normal breath sounds.  Musculoskeletal: Normal range of motion.  Skin:    General: Skin is warm and dry.  Neurological:     Mental Status: She is alert and oriented to person, place, and time.  Psychiatric:        Mood and Affect: Mood normal.         Behavior: Behavior normal.     RECENT LABS AND TESTS: BMET    Component Value Date/Time   NA 141 12/30/2017 1147   K 4.9 12/30/2017 1147   CL 102 12/30/2017 1147   CO2 23 12/30/2017 1147   GLUCOSE 108 (H) 12/30/2017 1147   GLUCOSE 113 (H) 06/20/2017 0856   BUN 17 12/30/2017 1147   CREATININE 0.87 12/30/2017 1147   CALCIUM 9.3 12/30/2017 1147   GFRNONAA 84 12/30/2017 1147   GFRAA 97 12/30/2017 1147   Lab Results  Component Value Date   HGBA1C 5.4 07/07/2018   HGBA1C 6.0 (H) 12/30/2017   HGBA1C 5.8 02/21/2017   Lab Results  Component Value Date   INSULIN 18.0 07/07/2018   INSULIN 89.7 (H) 12/30/2017   CBC    Component Value Date/Time   WBC 9.3 12/30/2017 1147   WBC 9.6 06/20/2017 0856   RBC 5.18 12/30/2017 1147   RBC 5.04 06/20/2017 0856   HGB 13.4 12/30/2017 1147   HCT 43.1 12/30/2017 1147   PLT 224.0 06/20/2017 0856   MCV 83 12/30/2017 1147   MCH 25.9 (L) 12/30/2017 1147   MCH 24.1 (L) 04/27/2017 2055   MCHC 31.1 (L) 12/30/2017 1147   MCHC 32.7 06/20/2017 0856   RDW 13.6 12/30/2017 1147   LYMPHSABS 2.0 12/30/2017 1147   MONOABS 0.6 02/21/2017 1510   EOSABS 0.2 12/30/2017 1147   BASOSABS 0.0 12/30/2017 1147   Iron/TIBC/Ferritin/ %Sat    Component Value Date/Time   IRON 85 06/20/2017 0856   TIBC 344 06/20/2017 0856   FERRITIN 55.4 06/20/2017 0856   IRONPCTSAT 25 06/20/2017 0856   Lipid Panel     Component Value Date/Time   CHOL 120 12/30/2017 1147   TRIG 65 12/30/2017 1147   HDL 45 12/30/2017 1147   CHOLHDL 3.3 04/08/2017 1124   CHOLHDL 4.7 02/22/2017 0420   VLDL 11 02/22/2017 0420   LDLCALC 62 12/30/2017  1147   Hepatic Function Panel     Component Value Date/Time   PROT 7.3 12/30/2017 1147   ALBUMIN 4.5 12/30/2017 1147   AST 24 12/30/2017 1147   ALT 28 12/30/2017 1147   ALKPHOS 70 12/30/2017 1147   BILITOT 0.3 12/30/2017 1147   BILIDIR 0.4 04/14/2017 0315   IBILI 0.7 04/14/2017 0315      Component Value Date/Time   TSH 1.270  12/30/2017 1147   TSH 3.01 06/20/2017 0856   TSH 4.78 (H) 02/21/2017 1510      OBESITY BEHAVIORAL INTERVENTION VISIT  Today's visit was # 24   Starting weight: 351 lbs Starting date: 12/30/17 Today's weight : 314 lbs  Today's date: 03/09/2019 Total lbs lost to date: 37    03/09/2019  Height 5\' 7"  (1.702 m)  Weight 314 lb (142.4 kg) (A)  BMI (Calculated) 49.17  BLOOD PRESSURE - SYSTOLIC 162  BLOOD PRESSURE - DIASTOLIC 80   Body Fat % 51.6 %  Total Body Water (lbs) 108.6 lbs     ASK: We discussed the diagnosis of obesity with Amber Bentley today and Amber Bentley agreed to give [name] permission to discuss obesity behavioral modification therapy today.  ASSESS: Amber Bentley has the diagnosis of obesity and her BMI today is 49.17 Amber Bentley is in the action stage of change   ADVISE: Amber Bentley was educated on the multiple health risks of obesity as well as the benefit of weight loss to improve her health. She was advised of the need for long term treatment and the importance of lifestyle modifications to improve her current health and to decrease her risk of future health problems.  AGREE: Multiple dietary modification options and treatment options were discussed and  Amber Bentley agreed to follow the recommendations documented in the above note.  ARRANGE: Amber Bentley was educated on the importance of frequent visits to treat obesity as outlined per CMS and USPSTF guidelines and agreed to schedule her next follow up appointment today.  I, Burt Knack, am acting as transcriptionist for Debbra Riding, MD  I have reviewed the above documentation for accuracy and completeness, and I agree with the above. - Debbra Riding, MD

## 2019-03-10 ENCOUNTER — Encounter (INDEPENDENT_AMBULATORY_CARE_PROVIDER_SITE_OTHER): Payer: Self-pay

## 2019-03-21 ENCOUNTER — Encounter (INDEPENDENT_AMBULATORY_CARE_PROVIDER_SITE_OTHER): Payer: Self-pay | Admitting: Family Medicine

## 2019-03-25 ENCOUNTER — Other Ambulatory Visit: Payer: Self-pay

## 2019-03-25 ENCOUNTER — Encounter (INDEPENDENT_AMBULATORY_CARE_PROVIDER_SITE_OTHER): Payer: Self-pay | Admitting: Family Medicine

## 2019-03-25 ENCOUNTER — Ambulatory Visit (INDEPENDENT_AMBULATORY_CARE_PROVIDER_SITE_OTHER): Payer: 59 | Admitting: Family Medicine

## 2019-03-25 DIAGNOSIS — E66813 Obesity, class 3: Secondary | ICD-10-CM

## 2019-03-25 DIAGNOSIS — F418 Other specified anxiety disorders: Secondary | ICD-10-CM | POA: Diagnosis not present

## 2019-03-25 DIAGNOSIS — E559 Vitamin D deficiency, unspecified: Secondary | ICD-10-CM

## 2019-03-25 DIAGNOSIS — Z6841 Body Mass Index (BMI) 40.0 and over, adult: Secondary | ICD-10-CM | POA: Diagnosis not present

## 2019-03-25 MED ORDER — VITAMIN D (ERGOCALCIFEROL) 1.25 MG (50000 UNIT) PO CAPS: 50000.0000 [IU] | ORAL_CAPSULE | ORAL | 0 refills | Status: DC

## 2019-03-25 MED ORDER — VORTIOXETINE HBR 20 MG PO TABS: 20.0000 mg | ORAL_TABLET | Freq: Every day | ORAL | 0 refills | Status: DC

## 2019-03-25 MED FILL — TRINTELLIX 20 MG TABLET: 20 | 30 days supply | Qty: 30 | Fill #0

## 2019-03-25 MED FILL — VIT D2 1.25 MG (50,000 UNIT: 1.25 MG | 28 days supply | Qty: 4 | Fill #0

## 2019-03-25 NOTE — Progress Notes (Signed)
Office: [phone]  /  Fax: [phone] TeleHealth Visit:  Amber Bentley has verbally consented to this TeleHealth visit today. The patient is located at home, the provider is located at the [facility] office. The participants in this visit include the listed provider and patient and provider's assistant. The visit was conducted today via webex.  HPI:   Chief Complaint: OBESITY Aleyna is here to discuss her progress with her obesity treatment plan. She is on the keep a food journal with 1400-1500 calories and 100 grams of protein daily and is following her eating plan approximately 50-60 % of the time. She states she is exercising 0 minutes 0 times per week. Livi's school closed so she has been at home with her kids. She has minimally gone out of the house. She is minimally getting out to walk the neighborhood. She says eating has not been good, she has been stressed and eating Easter candy, and doing baking projects.  We were unable to weigh the patient today for this TeleHealth visit. She feels as if she has gained weight since her last visit. She has lost 37 lbs since starting treatment with [name].  Vitamin D Deficiency Nainika has a diagnosis of vitamin D deficiency. She is currently taking prescription Vit D. She notes fatigue and denies nausea, vomiting or muscle weakness.  Depression with Anxiety Leeta struggles with depression and anxiety. She notes symptoms are better controlled on Trintellix.. She shows no sign of suicidal or homicidal ideations.  ASSESSMENT AND PLAN:  Vitamin D deficiency - Plan: Vitamin D, Ergocalciferol, (DRISDOL) 1.25 MG (50000 UT) CAPS capsule  Depression with anxiety - Plan: vortioxetine HBr (TRINTELLIX) 20 MG TABS tablet  Class 3 severe obesity with serious comorbidity and body mass index (BMI) of 45.0 to 49.9 in adult, unspecified obesity type (HCC)  PLAN:  Vitamin D Deficiency Delanny was informed that low vitamin D levels  contributes to fatigue and are associated with obesity, breast, and colon cancer. Shanese agrees to continue taking prescription Vit D @50 ,000 IU every week #4 and we will refill for 1 month. She will follow up for routine testing of vitamin D, at least 2-3 times per year. She was informed of the risk of over-replacement of vitamin D and agrees to not increase her dose unless she discusses this with [name] first. Laquitta agrees to follow up with our clinic in 2 weeks.  Depression with Anxiety We discussed behavior modification techniques today to help Shakenya deal with her depression and anxiety. Kanetra agrees to continue taking Trintellix 20 mg PO daily #30 and we will refill for 1 month. Envi agrees to follow up with our clinic in 2 weeks.   Obesity Jenett is currently in the action stage of change. As such, her goal is to continue with weight loss efforts She has agreed to keep a food journal with 1400-1500 calories and 100+ grams of protein daily Tabrina has been instructed to work up to a goal of 150 minutes of combined cardio and strengthening exercise per week for weight loss and overall health benefits. We discussed the following Behavioral Modification Strategies today: increasing lean protein intake, increasing vegetables, work on meal planning and easy cooking plans, avoiding temptations, better snacking choices, planning for success, keep a strict food journal    Tiffney has agreed to follow up with our clinic in 2 weeks. She was informed of the importance of frequent follow up visits to maximize her success with intensive lifestyle modifications for her  multiple health conditions.  ALLERGIES: Allergies  Allergen Reactions  . 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   [name] [Naproxen] Other (See Comments)    Pt states that this medication makes her loopy.     MEDICATIONS: Current Outpatient Medications on File Prior to Visit  Medication Sig Dispense Refill  .  carvedilol (COREG) 6.25 MG tablet Take 1 tablet (6.25 mg total) by mouth 2 (two) times daily. 180 tablet 3  . losartan (COZAAR) 50 MG tablet Take 1 tablet (50 mg total) by mouth daily. 30 tablet 11  . metFORMIN (GLUCOPHAGE) 500 MG tablet TAKE 1 TABLET BY MOUTH ONCE DAILY WITH BREAKFAST 90 tablet 0  . pantoprazole (PROTONIX) 40 MG tablet TAKE 1 TABLET BY MOUTH TWICE DAILY 180 tablet 1  . rosuvastatin (CRESTOR) 10 MG tablet TAKE 1 TABLET BY MOUTH DAILY. 90 tablet 3  . spironolactone (ALDACTONE) 25 MG tablet Take 1 tablet (25 mg total) by mouth daily. 90 tablet 3   No current facility-administered medications on file prior to visit.     PAST MEDICAL HISTORY: Past Medical History:  Diagnosis Date  . Anemia   . Anxiety   . Cardiomegaly   . Chest pain   . CHF (congestive heart failure) (HCC)   . Constipation   . Depression   . Dyspnea   . Fatigue   . Food allergy    celery  . GERD (gastroesophageal reflux disease)   . Hip pain   . HLD (hyperlipidemia)   . HTN (hypertension)   . Infertility, female   . Lactose intolerance   . Leg edema   . Lower back pain   . OSA on CPAP   . Palpitations   . Prediabetes   . Shoulder pain   . Stomach ulcer   . Varicose vein of leg     PAST SURGICAL HISTORY: Past Surgical History:  Procedure Laterality Date  . ABDOMINAL HYSTERECTOMY    . ENDOMETRIAL ABLATION  2007   prior to hysterectomy    SOCIAL HISTORY: Social History   Tobacco Use  . Smoking status: Former Smoker    Packs/day: 0.25    Years: 10.00    Pack years: 2.50    Types: Cigarettes  . Smokeless tobacco: Never Used  Substance Use Topics  . Alcohol use: Yes    Comment: rarely  . Drug use: No    FAMILY HISTORY: Family History  Problem Relation Age of Onset  . Hypertension Mother   . Cancer Mother        breast cancer  . Kidney disease Mother   . Depression Mother   . Obesity Mother   . Breast cancer Mother   . Diabetes Father   . Heart disease Father 28  .  Hyperlipidemia Father   . Hypertension Father   . Depression Father   . Anxiety disorder Father   . Sleep apnea Father   . Obesity Father   . Thyroid disease Sister   . Sudden death Brother   . Cancer Maternal Aunt        breast cancer  . Breast cancer Maternal Aunt        in 13's  . Cancer Paternal Grandfather        brain cancer  . Cancer Maternal Grandmother        breast  . Breast cancer Maternal Grandmother   . Breast cancer Paternal Grandmother     ROS: Review of  Systems  Constitutional: Positive for malaise/fatigue. Negative for weight loss.  Gastrointestinal: Negative for nausea and vomiting.  Musculoskeletal:       Negative muscle weakness  Psychiatric/Behavioral: Positive for depression. Negative for suicidal ideas.       + Anxiety    PHYSICAL EXAM: Pt in no acute distress  RECENT LABS AND TESTS: BMET    Component Value Date/Time   NA 141 12/30/2017 1147   K 4.9 12/30/2017 1147   CL 102 12/30/2017 1147   CO2 23 12/30/2017 1147   GLUCOSE 108 (H) 12/30/2017 1147   GLUCOSE 113 (H) 06/20/2017 0856   BUN 17 12/30/2017 1147   CREATININE 0.87 12/30/2017 1147   CALCIUM 9.3 12/30/2017 1147   GFRNONAA 84 12/30/2017 1147   GFRAA 97 12/30/2017 1147   Lab Results  Component Value Date   HGBA1C 5.4 07/07/2018   HGBA1C 6.0 (H) 12/30/2017   HGBA1C 5.8 02/21/2017   Lab Results  Component Value Date   INSULIN 18.0 07/07/2018   INSULIN 89.7 (H) 12/30/2017   CBC    Component Value Date/Time   WBC 9.3 12/30/2017 1147   WBC 9.6 06/20/2017 0856   RBC 5.18 12/30/2017 1147   RBC 5.04 06/20/2017 0856   HGB 13.4 12/30/2017 1147   HCT 43.1 12/30/2017 1147   PLT 224.0 06/20/2017 0856   MCV 83 12/30/2017 1147   MCH 25.9 (L) 12/30/2017 1147   MCH 24.1 (L) 04/27/2017 2055   MCHC 31.1 (L) 12/30/2017 1147   MCHC 32.7 06/20/2017 0856   RDW 13.6 12/30/2017 1147   LYMPHSABS 2.0 12/30/2017 1147   MONOABS 0.6 02/21/2017 1510   EOSABS 0.2 12/30/2017 1147   BASOSABS  0.0 12/30/2017 1147   Iron/TIBC/Ferritin/ %Sat    Component Value Date/Time   IRON 85 06/20/2017 0856   TIBC 344 06/20/2017 0856   FERRITIN 55.4 06/20/2017 0856   IRONPCTSAT 25 06/20/2017 0856   Lipid Panel     Component Value Date/Time   CHOL 120 12/30/2017 1147   TRIG 65 12/30/2017 1147   HDL 45 12/30/2017 1147   CHOLHDL 3.3 04/08/2017 1124   CHOLHDL 4.7 02/22/2017 0420   VLDL 11 02/22/2017 0420   LDLCALC 62 12/30/2017 1147   Hepatic Function Panel     Component Value Date/Time   PROT 7.3 12/30/2017 1147   ALBUMIN 4.5 12/30/2017 1147   AST 24 12/30/2017 1147   ALT 28 12/30/2017 1147   ALKPHOS 70 12/30/2017 1147   BILITOT 0.3 12/30/2017 1147   BILIDIR 0.4 04/14/2017 0315   IBILI 0.7 04/14/2017 0315      Component Value Date/Time   TSH 1.270 12/30/2017 1147   TSH 3.01 06/20/2017 0856   TSH 4.78 (H) 02/21/2017 1510      I, Burt Knack, am acting as transcriptionist for Debbra Riding, MD  I have reviewed the above documentation for accuracy and completeness, and I agree with the above. - Debbra Riding, MD

## 2019-03-30 DIAGNOSIS — I159 Secondary hypertension, unspecified: Secondary | ICD-10-CM | POA: Diagnosis not present

## 2019-03-30 DIAGNOSIS — I5032 Chronic diastolic (congestive) heart failure: Secondary | ICD-10-CM | POA: Diagnosis not present

## 2019-03-30 DIAGNOSIS — I5041 Acute combined systolic (congestive) and diastolic (congestive) heart failure: Secondary | ICD-10-CM | POA: Diagnosis not present

## 2019-04-01 MED FILL — SPIRONOLACTONE 25 MG TABS: 25 | 90 days supply | Qty: 90 | Fill #1

## 2019-04-08 ENCOUNTER — Other Ambulatory Visit: Payer: Self-pay

## 2019-04-08 ENCOUNTER — Encounter (INDEPENDENT_AMBULATORY_CARE_PROVIDER_SITE_OTHER): Payer: Self-pay | Admitting: Family Medicine

## 2019-04-08 ENCOUNTER — Ambulatory Visit (INDEPENDENT_AMBULATORY_CARE_PROVIDER_SITE_OTHER): Payer: 59 | Admitting: Family Medicine

## 2019-04-08 DIAGNOSIS — R7303 Prediabetes: Secondary | ICD-10-CM | POA: Diagnosis not present

## 2019-04-08 DIAGNOSIS — I5020 Unspecified systolic (congestive) heart failure: Secondary | ICD-10-CM

## 2019-04-08 DIAGNOSIS — Z6841 Body Mass Index (BMI) 40.0 and over, adult: Secondary | ICD-10-CM | POA: Diagnosis not present

## 2019-04-08 DIAGNOSIS — E559 Vitamin D deficiency, unspecified: Secondary | ICD-10-CM

## 2019-04-13 NOTE — Progress Notes (Signed)
Office: [phone]  /  Fax: [phone] TeleHealth Visit:  Amber PonderRobin Annette Bentley has verbally consented to this TeleHealth visit today. The patient is located at home, the provider is located at the [facility] office. The participants in this visit include the listed provider and patient. The visit was conducted today via webex.  HPI:   Chief Complaint: OBESITY Amber Bentley is here to discuss her progress with her obesity treatment plan. She is on the keep a food journal with 1400-1500 calories and 100+ grams of protein daily and is following her eating plan approximately 50-60 % of the time. She states she is exercising 0 minutes 0 times per week. 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 She feels more in control when she is working. She has been journaling just some days. She doesn't hit protein and stays within her calories.  We were unable to weigh the patient today for this TeleHealth visit. She feels as if she has maintained her weight since her last visit. She has lost 0 lbs since starting treatment with us.  Pre-Diabetes Amber Bentley has a diagnosis of pre-diabetes based on her elevated Hgb A1c and was informed this puts her at greater risk of developing diabetes. Last Hgb A1c and insulin on 07/07/18 were within normal limits. She is taking metformin currently and continues to work on diet and exercise to decrease risk of diabetes. She denies nausea or hypoglycemia.  Systolic Heart Failure Amber Bentley has a diagnosis of systolic heart failure. She sees Cardiology.  Vitamin D Deficiency Amber Bentley has a diagnosis of vitamin D deficiency. She is currently taking prescription Vit D. She notes fatigue and denies nausea, vomiting or muscle weakness.  ASSESSMENT AND PLAN:  Prediabetes - Plan: Comprehensive metabolic panel, Hemoglobin A1c, Insulin, random, Vitamin B12  Systolic heart failure, unspecified HF chronicity (HCC) - Plan: Lipid Panel  With LDL/HDL Ratio  Vitamin D deficiency - Plan: VITAMIN D 25 Hydroxy (Vit-D Deficiency, Fractures)  Class 3 severe obesity with serious comorbidity and body mass index (BMI) of 45.0 to 49.9 in adult, unspecified obesity type (HCC)  PLAN:  Pre-Diabetes Amber Bentley will continue to work on weight loss, exercise, and decreasing simple carbohydrates in her diet to help decrease the risk of diabetes. We dicussed metformin including benefits and risks. She was informed that eating too many simple carbohydrates or too many calories at one sitting increases the likelihood of GI side effects. Amber Bentley agrees to continue taking metformin, and we will repeat Hgb A1c, insulin, and Vit B12 level. Amber Bentley agrees to follow up with our clinic in 2 weeks as directed to monitor her progress.  Systolic Heart Failure We will repeat CMP and FLP, and Amber Bentley agrees to follow up with our clinic in 2 weeks.  Vitamin D Deficiency Amber Bentley was informed that low vitamin D levels contributes to fatigue and are associated with obesity, breast, and colon cancer. Amber Bentley agrees to continue taking prescription Vit D @50 ,000 IU every week and will follow up for routine testing of vitamin D, at least 2-3 times per year. She was informed of the risk of over-replacement of vitamin D and agrees to not increase her dose unless she discusses this with us first. We will repeat Vit D level. Amber Bentley agrees to follow up with our clinic in 2 weeks.  Obesity Amber Bentley is currently in the action stage of change. As such, her goal is to continue with weight loss efforts She has  agreed to keep a food journal with 1400-1500 calories and 100+ grams of protein daily Amber Bentley has been instructed to work up to a goal of 150 minutes of combined cardio and strengthening exercise per week for weight loss and overall health benefits. We discussed the following Behavioral Modification Strategies today: emotional eating strategies, ways to avoid boredom eating, keeping healthy  foods in the home, better snacking choices, planning for success, and keep a strict food journal   Amber Bentley has agreed to follow up with our clinic in 2 weeks. She was informed of the importance of frequent follow up visits to maximize her success with intensive lifestyle modifications for her multiple health conditions.  ALLERGIES: Allergies  Allergen Reactions   Food Anaphylaxis and Other (See Comments)    Pt is allergic to celery.    Aleve [Naproxen] Other (See Comments)    Pt states that this medication makes her loopy.     MEDICATIONS: Current Outpatient Medications on File Prior to Visit  Medication Sig Dispense Refill   carvedilol (COREG) 6.25 MG tablet Take 1 tablet (6.25 mg total) by mouth 2 (two) times daily. 180 tablet 3   losartan (COZAAR) 50 MG tablet Take 1 tablet (50 mg total) by mouth daily. 30 tablet 11   metFORMIN (GLUCOPHAGE) 500 MG tablet TAKE 1 TABLET BY MOUTH ONCE DAILY WITH BREAKFAST 90 tablet 0   pantoprazole (PROTONIX) 40 MG tablet TAKE 1 TABLET BY MOUTH TWICE DAILY 180 tablet 1   rosuvastatin (CRESTOR) 10 MG tablet TAKE 1 TABLET BY MOUTH DAILY. 90 tablet 3   spironolactone (ALDACTONE) 25 MG tablet Take 1 tablet (25 mg total) by mouth daily. 90 tablet 3   Vitamin D, Ergocalciferol, (DRISDOL) 1.25 MG (50000 UT) CAPS capsule Take 1 capsule (50,000 Units total) by mouth every 7 (seven) days. 4 capsule 0   vortioxetine HBr (TRINTELLIX) 20 MG TABS tablet Take 1 tablet (20 mg total) by mouth daily. 30 tablet 0   No current facility-administered medications on file prior to visit.     PAST MEDICAL HISTORY: Past Medical History:  Diagnosis Date   Anemia    Anxiety    Cardiomegaly    Chest pain    CHF (congestive heart failure) (HCC)    Constipation    Depression    Dyspnea    Fatigue    Food allergy    celery   GERD (gastroesophageal reflux disease)    Hip pain    HLD (hyperlipidemia)    HTN (hypertension)    Infertility, female      Lactose intolerance    Leg edema    Lower back pain    OSA on CPAP    Palpitations    Prediabetes    Shoulder pain    Stomach ulcer    Varicose vein of leg     PAST SURGICAL HISTORY: Past Surgical History:  Procedure Laterality Date   ABDOMINAL HYSTERECTOMY     ENDOMETRIAL ABLATION  2007   prior to hysterectomy    SOCIAL HISTORY: Social History   Tobacco Use   Smoking status: Former Smoker    Packs/day: 0.25    Years: 10.00    Pack years: 2.50    Types: Cigarettes   Smokeless tobacco: Never Used  Substance Use Topics   Alcohol use: Yes    Comment: rarely   Drug use: No    FAMILY HISTORY: Family History  Problem Relation Age of Onset   Hypertension Mother    Cancer Mother  breast cancer   Kidney disease Mother    Depression Mother    Obesity Mother    Breast cancer Mother    Diabetes Father    Heart disease Father 41   Hyperlipidemia Father    Hypertension Father    Depression Father    Anxiety disorder Father    Sleep apnea Father    Obesity Father    Thyroid disease Sister    Sudden death Brother    Cancer Maternal Aunt        breast cancer   Breast cancer Maternal Aunt        in 9's   Cancer Paternal Grandfather        brain cancer   Cancer Maternal Grandmother        breast   Breast cancer Maternal Grandmother    Breast cancer Paternal Grandmother     ROS: Review of Systems  Constitutional: Positive for malaise/fatigue. Negative for weight loss.  Gastrointestinal: Negative for nausea and vomiting.  Musculoskeletal:       Negative muscle weakness  Endo/Heme/Allergies:       Negative hypoglycemia    PHYSICAL EXAM: Pt in no acute distress  RECENT LABS AND TESTS: BMET    Component Value Date/Time   NA 141 12/30/2017 1147   K 4.9 12/30/2017 1147   CL 102 12/30/2017 1147   CO2 23 12/30/2017 1147   GLUCOSE 108 (H) 12/30/2017 1147   GLUCOSE 113 (H) 06/20/2017 0856   BUN 17 12/30/2017  1147   CREATININE 0.87 12/30/2017 1147   CALCIUM 9.3 12/30/2017 1147   GFRNONAA 84 12/30/2017 1147   GFRAA 97 12/30/2017 1147   Lab Results  Component Value Date   HGBA1C 5.4 07/07/2018   HGBA1C 6.0 (H) 12/30/2017   HGBA1C 5.8 02/21/2017   Lab Results  Component Value Date   INSULIN 18.0 07/07/2018   INSULIN 89.7 (H) 12/30/2017   CBC    Component Value Date/Time   WBC 9.3 12/30/2017 1147   WBC 9.6 06/20/2017 0856   RBC 5.18 12/30/2017 1147   RBC 5.04 06/20/2017 0856   HGB 13.4 12/30/2017 1147   HCT 43.1 12/30/2017 1147   PLT 224.0 06/20/2017 0856   MCV 83 12/30/2017 1147   MCH 25.9 (L) 12/30/2017 1147   MCH 24.1 (L) 04/27/2017 2055   MCHC 31.1 (L) 12/30/2017 1147   MCHC 32.7 06/20/2017 0856   RDW 13.6 12/30/2017 1147   LYMPHSABS 2.0 12/30/2017 1147   MONOABS 0.6 02/21/2017 1510   EOSABS 0.2 12/30/2017 1147   BASOSABS 0.0 12/30/2017 1147   Iron/TIBC/Ferritin/ %Sat    Component Value Date/Time   IRON 85 06/20/2017 0856   TIBC 344 06/20/2017 0856   FERRITIN 55.4 06/20/2017 0856   IRONPCTSAT 25 06/20/2017 0856   Lipid Panel     Component Value Date/Time   CHOL 120 12/30/2017 1147   TRIG 65 12/30/2017 1147   HDL 45 12/30/2017 1147   CHOLHDL 3.3 04/08/2017 1124   CHOLHDL 4.7 02/22/2017 0420   VLDL 11 02/22/2017 0420   LDLCALC 62 12/30/2017 1147   Hepatic Function Panel     Component Value Date/Time   PROT 7.3 12/30/2017 1147   ALBUMIN 4.5 12/30/2017 1147   AST 24 12/30/2017 1147   ALT 28 12/30/2017 1147   ALKPHOS 70 12/30/2017 1147   BILITOT 0.3 12/30/2017 1147   BILIDIR 0.4 04/14/2017 0315   IBILI 0.7 04/14/2017 0315      Component Value Date/Time   TSH 1.270  12/30/2017 1147   TSH 3.01 06/20/2017 0856   TSH 4.78 (H) 02/21/2017 1510      I, Burt Knack, am acting as transcriptionist for Debbra Riding, MD  I have reviewed the above documentation for accuracy and completeness, and I agree with the above. - Debbra Riding, MD

## 2019-04-14 DIAGNOSIS — R7303 Prediabetes: Secondary | ICD-10-CM | POA: Diagnosis not present

## 2019-04-14 DIAGNOSIS — I5020 Unspecified systolic (congestive) heart failure: Secondary | ICD-10-CM | POA: Diagnosis not present

## 2019-04-14 DIAGNOSIS — E559 Vitamin D deficiency, unspecified: Secondary | ICD-10-CM | POA: Diagnosis not present

## 2019-04-15 LAB — COMPREHENSIVE METABOLIC PANEL
ALT: 17 IU/L (ref 0–32)
AST: 15 IU/L (ref 0–40)
Albumin/Globulin Ratio: 1.8 (ref 1.2–2.2)
Albumin: 4.1 g/dL (ref 3.8–4.8)
Alkaline Phosphatase: 67 IU/L (ref 39–117)
BUN/Creatinine Ratio: 16 (ref 9–23)
BUN: 13 mg/dL (ref 6–24)
Bilirubin Total: 0.3 mg/dL (ref 0.0–1.2)
CO2: 23 mmol/L (ref 20–29)
Calcium: 9.1 mg/dL (ref 8.7–10.2)
Chloride: 101 mmol/L (ref 96–106)
Creatinine, Ser: 0.8 mg/dL (ref 0.57–1.00)
GFR calc Af Amer: 107 mL/min/{1.73_m2} (ref >59)
GFR calc non Af Amer: 93 mL/min/{1.73_m2} (ref >59)
Globulin, Total: 2.3 g/dL (ref 1.5–4.5)
Glucose: 79 mg/dL (ref 65–99)
Potassium: 4.3 mmol/L (ref 3.5–5.2)
Sodium: 142 mmol/L (ref 134–144)
Total Protein: 6.4 g/dL (ref 6.0–8.5)

## 2019-04-15 LAB — HEMOGLOBIN A1C
Est. average glucose Bld gHb Est-mCnc: 111 mg/dL
Hgb A1c MFr Bld: 5.5 % (ref 4.8–5.6)

## 2019-04-15 LAB — VITAMIN B12: Vitamin B-12: 370 pg/mL (ref 232–1245)

## 2019-04-15 LAB — LIPID PANEL WITH LDL/HDL RATIO
Cholesterol, Total: 119 mg/dL (ref 100–199)
HDL: 47 mg/dL (ref >39)
LDL Calculated: 60 mg/dL (ref 0–99)
LDl/HDL Ratio: 1.3 ratio (ref 0.0–3.2)
Triglycerides: 60 mg/dL (ref 0–149)
VLDL Cholesterol Cal: 12 mg/dL (ref 5–40)

## 2019-04-15 LAB — VITAMIN D 25 HYDROXY (VIT D DEFICIENCY, FRACTURES): Vit D, 25-Hydroxy: 38.7 ng/mL (ref 30.0–100.0)

## 2019-04-15 LAB — INSULIN, RANDOM: INSULIN: 28.4 u[IU]/mL — ABNORMAL HIGH (ref 2.6–24.9)

## 2019-04-20 ENCOUNTER — Encounter (INDEPENDENT_AMBULATORY_CARE_PROVIDER_SITE_OTHER): Payer: Self-pay | Admitting: Family Medicine

## 2019-04-20 ENCOUNTER — Other Ambulatory Visit: Payer: Self-pay

## 2019-04-20 ENCOUNTER — Ambulatory Visit (INDEPENDENT_AMBULATORY_CARE_PROVIDER_SITE_OTHER): Payer: 59 | Admitting: Family Medicine

## 2019-04-20 DIAGNOSIS — E66813 Obesity, class 3: Secondary | ICD-10-CM

## 2019-04-20 DIAGNOSIS — E559 Vitamin D deficiency, unspecified: Secondary | ICD-10-CM | POA: Diagnosis not present

## 2019-04-20 DIAGNOSIS — Z6841 Body Mass Index (BMI) 40.0 and over, adult: Secondary | ICD-10-CM | POA: Diagnosis not present

## 2019-04-20 DIAGNOSIS — R7303 Prediabetes: Secondary | ICD-10-CM

## 2019-04-21 MED ORDER — VITAMIN D (ERGOCALCIFEROL) 1.25 MG (50000 UNIT) PO CAPS: 50000.0000 [IU] | ORAL_CAPSULE | ORAL | 0 refills | Status: DC

## 2019-04-21 MED FILL — LOSARTAN POTASSIUM 50 MG TA: 50 | 30 days supply | Qty: 30 | Fill #3

## 2019-04-21 MED FILL — VIT D2 1.25 MG (50,000 UNIT: 1.25 MG | 28 days supply | Qty: 4 | Fill #0

## 2019-04-21 NOTE — Progress Notes (Signed)
Office: [phone]  /  Fax: [phone] TeleHealth Visit:  [name] has verbally consented to this TeleHealth visit today. The patient is located at home, the provider is located at the [facility] office. The participants in this visit include the listed provider and patient. The visit was conducted today via webex.  HPI:   Chief Complaint: OBESITY Amber Bentley is here to discuss her progress with her obesity treatment plan. She is on the keep a food journal with 1400-1500 calories and 100+ grams of protein daily and is following her eating plan approximately 80 % of the time. She states she is exercising 0 minutes 0 times per week. 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 She is very tired, getting back into her schedule. She notes journaling is going fairly well. She is not always hitting calories and sometimes indulgent eating.  We were unable to weigh the patient today for this TeleHealth visit. She feels as if she has lost weight since her last visit. She has lost 0 lbs since starting treatment with [name].  Vitamin D Deficiency Amber Bentley has a diagnosis of vitamin D deficiency. She is currently taking prescription Vit D. She notes fatigue and denies nausea, vomiting or muscle weakness.  Pre-Diabetes Amber Bentley has a diagnosis of pre-diabetes based on her elevated Hgb A1c and was informed this puts her at greater risk of developing diabetes. She notes occasional carbohydrate cravings and indulgent eating. She denies GI side effects of metformin and continues to work on diet and exercise to decrease risk of diabetes.   ASSESSMENT AND PLAN:  Vitamin D deficiency - Plan: Vitamin D, Ergocalciferol, (DRISDOL) 1.25 MG (50000 UT) CAPS capsule  Prediabetes  Class 3 severe obesity with serious comorbidity and body mass index (BMI) of 45.0 to 49.9 in adult, unspecified obesity type (HCC)  PLAN:  Vitamin D Deficiency [place] was informed that low vitamin D levels contributes to  fatigue and are associated with obesity, breast, and colon cancer. Donnamaria agrees to continue taking prescription Vit D @50 ,000 IU every week #4 and we will refill for 1 month. She will follow up for routine testing of vitamin D, at least 2-3 times per year. She was informed of the risk of over-replacement of vitamin D and agrees to not increase her dose unless she discusses this with [name] first. Taniko agrees to follow up with our clinic in 2 weeks.  Pre-Diabetes Amber Bentley will continue to work on weight loss, exercise, and decreasing simple carbohydrates in her diet to help decrease the risk of diabetes. We dicussed metformin including benefits and risks. She was informed that eating too many simple carbohydrates or too many calories at one sitting increases the likelihood of GI side effects. Amber Bentley agrees to continue taking metformin, and we will retest labs in early August. Amber Bentley agrees to follow up with our clinic in 2 weeks as directed to monitor her progress.  Obesity Amber Bentley is currently in the action stage of change. As such, her goal is to continue with weight loss efforts She has agreed to keep a food journal with 1400-1500 calories and 100+ grams of protein daily Amber Bentley has been instructed to work up to a goal of 150 minutes of combined cardio and strengthening exercise per week for weight loss and overall health benefits. We discussed the following Behavioral Modification Strategies today: increasing lean protein intake, increasing vegetables and work on meal planning and easy cooking plans, keeping healthy foods in the home, better snacking choices, and planning for success  Amber Bentley has agreed to follow up with our clinic in 2 weeks. She was informed of the importance of frequent follow up visits to maximize her success with intensive lifestyle modifications for her multiple health conditions.  ALLERGIES: Allergies  Allergen Reactions  . Food Anaphylaxis and Other (See Comments)    Pt is allergic  to celery.   Amber Bentley [Naproxen] Other (See Comments)    Pt states that this medication makes her loopy.     MEDICATIONS: Current Outpatient Medications on File Prior to Visit  Medication Sig Dispense Refill  . carvedilol (COREG) 6.25 MG tablet Take 1 tablet (6.25 mg total) by mouth 2 (two) times daily. 180 tablet 3  . losartan (COZAAR) 50 MG tablet Take 1 tablet (50 mg total) by mouth daily. 30 tablet 11  . metFORMIN (GLUCOPHAGE) 500 MG tablet TAKE 1 TABLET BY MOUTH ONCE DAILY WITH BREAKFAST 90 tablet 0  . pantoprazole (PROTONIX) 40 MG tablet TAKE 1 TABLET BY MOUTH TWICE DAILY 180 tablet 1  . rosuvastatin (CRESTOR) 10 MG tablet TAKE 1 TABLET BY MOUTH DAILY. 90 tablet 3  . spironolactone (ALDACTONE) 25 MG tablet Take 1 tablet (25 mg total) by mouth daily. 90 tablet 3  . Vitamin D, Ergocalciferol, (DRISDOL) 1.25 MG (50000 UT) CAPS capsule Take 1 capsule (50,000 Units total) by mouth every 7 (seven) days. 4 capsule 0  . vortioxetine HBr (TRINTELLIX) 20 MG TABS tablet Take 1 tablet (20 mg total) by mouth daily. 30 tablet 0   No current facility-administered medications on file prior to visit.     PAST MEDICAL HISTORY: Past Medical History:  Diagnosis Date  . Anemia   . Anxiety   . Cardiomegaly   . Chest pain   . CHF (congestive heart failure) (HCC)   . Constipation   . Depression   . Dyspnea   . Fatigue   . Food allergy    celery  . GERD (gastroesophageal reflux disease)   . Hip pain   . HLD (hyperlipidemia)   . HTN (hypertension)   . Infertility, female   . Lactose intolerance   . Leg edema   . Lower back pain   . OSA on CPAP   . Palpitations   . Prediabetes   . Shoulder pain   . Stomach ulcer   . Varicose vein of leg     PAST SURGICAL HISTORY: Past Surgical History:  Procedure Laterality Date  . ABDOMINAL HYSTERECTOMY    . ENDOMETRIAL ABLATION  2007   prior to hysterectomy    SOCIAL HISTORY: Social History   Tobacco Use  . Smoking status: Former Smoker     Packs/day: 0.25    Years: 10.00    Pack years: 2.50    Types: Cigarettes  . Smokeless tobacco: Never Used  Substance Use Topics  . Alcohol use: Yes    Comment: rarely  . Drug use: No    FAMILY HISTORY: Family History  Problem Relation Age of Onset  . Hypertension Mother   . Cancer Mother        breast cancer  . Kidney disease Mother   . Depression Mother   . Obesity Mother   . Breast cancer Mother   . Diabetes Father   . Heart disease Father 52  . Hyperlipidemia Father   . Hypertension Father   . Depression Father   . Anxiety disorder Father   . Sleep apnea Father   . Obesity Father   . Thyroid disease Sister   .  Sudden death Brother   . Cancer Maternal Aunt        breast cancer  . Breast cancer Maternal Aunt        in 53's  . Cancer Paternal Grandfather        brain cancer  . Cancer Maternal Grandmother        breast  . Breast cancer Maternal Grandmother   . Breast cancer Paternal Grandmother     ROS: Review of Systems  Constitutional: Positive for malaise/fatigue and weight loss.  Gastrointestinal: Negative for nausea and vomiting.  Musculoskeletal:       Negative muscle weakness    PHYSICAL EXAM: Pt in no acute distress  RECENT LABS AND TESTS: BMET    Component Value Date/Time   NA 142 04/14/2019 1126   K 4.3 04/14/2019 1126   CL 101 04/14/2019 1126   CO2 23 04/14/2019 1126   GLUCOSE 79 04/14/2019 1126   GLUCOSE 113 (H) 06/20/2017 0856   BUN 13 04/14/2019 1126   CREATININE 0.80 04/14/2019 1126   CALCIUM 9.1 04/14/2019 1126   GFRNONAA 93 04/14/2019 1126   GFRAA 107 04/14/2019 1126   Lab Results  Component Value Date   HGBA1C 5.5 04/14/2019   HGBA1C 5.4 07/07/2018   HGBA1C 6.0 (H) 12/30/2017   HGBA1C 5.8 02/21/2017   Lab Results  Component Value Date   INSULIN 28.4 (H) 04/14/2019   INSULIN 18.0 07/07/2018   INSULIN 89.7 (H) 12/30/2017   CBC    Component Value Date/Time   WBC 9.3 12/30/2017 1147   WBC 9.6 06/20/2017 0856   RBC  5.18 12/30/2017 1147   RBC 5.04 06/20/2017 0856   HGB 13.4 12/30/2017 1147   HCT 43.1 12/30/2017 1147   PLT 224.0 06/20/2017 0856   MCV 83 12/30/2017 1147   MCH 25.9 (L) 12/30/2017 1147   MCH 24.1 (L) 04/27/2017 2055   MCHC 31.1 (L) 12/30/2017 1147   MCHC 32.7 06/20/2017 0856   RDW 13.6 12/30/2017 1147   LYMPHSABS 2.0 12/30/2017 1147   MONOABS 0.6 02/21/2017 1510   EOSABS 0.2 12/30/2017 1147   BASOSABS 0.0 12/30/2017 1147   Iron/TIBC/Ferritin/ %Sat    Component Value Date/Time   IRON 85 06/20/2017 0856   TIBC 344 06/20/2017 0856   FERRITIN 55.4 06/20/2017 0856   IRONPCTSAT 25 06/20/2017 0856   Lipid Panel     Component Value Date/Time   CHOL 119 04/14/2019 1126   TRIG 60 04/14/2019 1126   HDL 47 04/14/2019 1126   CHOLHDL 3.3 04/08/2017 1124   CHOLHDL 4.7 02/22/2017 0420   VLDL 11 02/22/2017 0420   LDLCALC 60 04/14/2019 1126   Hepatic Function Panel     Component Value Date/Time   PROT 6.4 04/14/2019 1126   ALBUMIN 4.1 04/14/2019 1126   AST 15 04/14/2019 1126   ALT 17 04/14/2019 1126   ALKPHOS 67 04/14/2019 1126   BILITOT 0.3 04/14/2019 1126   BILIDIR 0.4 04/14/2017 0315   IBILI 0.7 04/14/2017 0315      Component Value Date/Time   TSH 1.270 12/30/2017 1147   TSH 3.01 06/20/2017 0856   TSH 4.78 (H) 02/21/2017 1510      I, Burt Knack, am acting as transcriptionist for Debbra Riding, MD  I have reviewed the above documentation for accuracy and completeness, and I agree with the above. - Debbra Riding, MD

## 2019-04-28 ENCOUNTER — Encounter (INDEPENDENT_AMBULATORY_CARE_PROVIDER_SITE_OTHER): Payer: Self-pay | Admitting: Family Medicine

## 2019-04-28 NOTE — Telephone Encounter (Signed)
FYI

## 2019-04-29 DIAGNOSIS — I5032 Chronic diastolic (congestive) heart failure: Secondary | ICD-10-CM | POA: Diagnosis not present

## 2019-04-29 DIAGNOSIS — I159 Secondary hypertension, unspecified: Secondary | ICD-10-CM | POA: Diagnosis not present

## 2019-04-29 DIAGNOSIS — I5041 Acute combined systolic (congestive) and diastolic (congestive) heart failure: Secondary | ICD-10-CM | POA: Diagnosis not present

## 2019-05-04 ENCOUNTER — Other Ambulatory Visit: Payer: Self-pay

## 2019-05-04 ENCOUNTER — Encounter (INDEPENDENT_AMBULATORY_CARE_PROVIDER_SITE_OTHER): Payer: Self-pay | Admitting: Family Medicine

## 2019-05-04 ENCOUNTER — Ambulatory Visit (INDEPENDENT_AMBULATORY_CARE_PROVIDER_SITE_OTHER): Payer: 59 | Admitting: Family Medicine

## 2019-05-04 DIAGNOSIS — F418 Other specified anxiety disorders: Secondary | ICD-10-CM

## 2019-05-04 DIAGNOSIS — Z6841 Body Mass Index (BMI) 40.0 and over, adult: Secondary | ICD-10-CM

## 2019-05-04 MED ORDER — BUSPIRONE HCL 7.5 MG PO TABS: 7.5000 mg | ORAL_TABLET | Freq: Two times a day (BID) | ORAL | 0 refills | Status: DC

## 2019-05-04 MED FILL — BUSPIRONE HCL 7.5 MG TABS: 7.5 | 30 days supply | Qty: 60 | Fill #0

## 2019-05-05 NOTE — Progress Notes (Signed)
Office: [phone]  /  Fax: [phone] TeleHealth Visit:  Amber Bentley has verbally consented to this TeleHealth visit today. The patient is located at home, the provider is located at the [facility] office. The participants in this visit include the listed provider and patient. The visit was conducted today via webex.  HPI:   Chief Complaint: OBESITY Amber Bentley is here to discuss her progress with her obesity treatment plan. She is on the keep a food journal with 1400-1500 calories and 100+ grams of protein daily and is following her eating plan approximately 60-75 % of the time. She states she is back at work chasing around kids. Amber Bentley has been struggling the past 2 weeks. She is suffering from fairly significant anxiety about health and being back at work. She is having significant sweet cravings with increase in anxiety and stress.  We were unable to weigh the patient today for this TeleHealth visit. She feels as if she has gained weight since her last visit. She has lost 0 lbs since starting treatment with [name].  Vitamin D Deficiency Amber Bentley has a diagnosis of vitamin D deficiency. She is currently taking prescription Vit D. Last Vit D level was of 38.7. She notes fatigue and denies nausea, vomiting or muscle weakness.  Depression with Anxiety Amber Bentley is struggling with depression and anxiety. She was previously on Trintellix and SSRIs, but stopped secondary to sexual side effects. She shows no sign of suicidal or homicidal ideations.  ASSESSMENT AND PLAN:  Depression with anxiety - Plan: busPIRone (BUSPAR) 7.5 MG tablet  Class 3 severe obesity with serious comorbidity and body mass index (BMI) of 45.0 to 49.9 in adult, unspecified obesity type (HCC)  PLAN:  Vitamin D Deficiency Amber Bentley was informed that low vitamin D levels contributes to fatigue and are associated with obesity, breast, and colon cancer. Amber Bentley agrees to continue taking prescription Vit D @50 ,000 IU  every week and will follow up for routine testing of vitamin D, at least 2-3 times per year. She was informed of the risk of over-replacement of vitamin D and agrees to not increase her dose unless she discusses this with [name] first. Amber Bentley agrees to follow up with our clinic in 2 weeks.  Depression with Anxiety We discussed behavior modification techniques today to help Amber Bentley deal with her depression and anxiety. Amber Bentley agrees to start Buspar 7.5 mg PO BID #60 with no refills. Amber Bentley agrees to follow up with our clinic in 2 weeks.   Obesity Amber Bentley is currently in the action stage of change. As such, her goal is to continue with weight loss efforts She has agreed to keep a food journal with 1450-1600 calories and 100+ grams of protein daily Amber Bentley has been instructed to work up to a goal of 150 minutes of combined cardio and strengthening exercise per week for weight loss and overall health benefits. We discussed the following Behavioral Modification Strategies today: increasing lean protein intake, increasing vegetables and work on meal planning and easy cooking plans, keeping healthy foods in the home, and planning for success   Amber Bentley has agreed to follow up with our clinic in 2 weeks. She was informed of the importance of frequent follow up visits to maximize her success with intensive lifestyle modifications for her multiple health conditions.  ALLERGIES: Allergies  Allergen Reactions  . 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   [name] [Naproxen] Other (See Comments)    Pt states  that this medication makes her loopy.     MEDICATIONS: Current Outpatient Medications on File Prior to Visit  Medication Sig Dispense Refill  . carvedilol (COREG) 6.25 MG tablet Take 1 tablet (6.25 mg total) by mouth 2 (two) times daily. 180 tablet 3  . losartan (COZAAR) 50 MG tablet Take 1 tablet (50 mg total) by mouth daily. 30 tablet 11  . metFORMIN (GLUCOPHAGE) 500 MG tablet TAKE  1 TABLET BY MOUTH ONCE DAILY WITH BREAKFAST 90 tablet 0  . pantoprazole (PROTONIX) 40 MG tablet TAKE 1 TABLET BY MOUTH TWICE DAILY 180 tablet 1  . rosuvastatin (CRESTOR) 10 MG tablet TAKE 1 TABLET BY MOUTH DAILY. 90 tablet 3  . spironolactone (ALDACTONE) 25 MG tablet Take 1 tablet (25 mg total) by mouth daily. 90 tablet 3  . Vitamin D, Ergocalciferol, (DRISDOL) 1.25 MG (50000 UT) CAPS capsule Take 1 capsule (50,000 Units total) by mouth every 7 (seven) days. 4 capsule 0  . vortioxetine HBr (TRINTELLIX) 20 MG TABS tablet Take 1 tablet (20 mg total) by mouth daily. 30 tablet 0   No current facility-administered medications on file prior to visit.     PAST MEDICAL HISTORY: Past Medical History:  Diagnosis Date  . Anemia   . Anxiety   . Cardiomegaly   . Chest pain   . CHF (congestive heart failure) (HCC)   . Constipation   . Depression   . Dyspnea   . Fatigue   . Food allergy    celery  . GERD (gastroesophageal reflux disease)   . Hip pain   . HLD (hyperlipidemia)   . HTN (hypertension)   . Infertility, female   . Lactose intolerance   . Leg edema   . Lower back pain   . OSA on CPAP   . Palpitations   . Prediabetes   . Shoulder pain   . Stomach ulcer   . Varicose vein of leg     PAST SURGICAL HISTORY: Past Surgical History:  Procedure Laterality Date  . ABDOMINAL HYSTERECTOMY    . ENDOMETRIAL ABLATION  2007   prior to hysterectomy    SOCIAL HISTORY: Social History   Tobacco Use  . Smoking status: Former Smoker    Packs/day: 0.25    Years: 10.00    Pack years: 2.50    Types: Cigarettes  . Smokeless tobacco: Never Used  Substance Use Topics  . Alcohol use: Yes    Comment: rarely  . Drug use: No    FAMILY HISTORY: Family History  Problem Relation Age of Onset  . Hypertension Mother   . Cancer Mother        breast cancer  . Kidney disease Mother   . Depression Mother   . Obesity Mother   . Breast cancer Mother   . Diabetes Father   . Heart disease  Father 23  . Hyperlipidemia Father   . Hypertension Father   . Depression Father   . Anxiety disorder Father   . Sleep apnea Father   . Obesity Father   . Thyroid disease Sister   . Sudden death Brother   . Cancer Maternal Aunt        breast cancer  . Breast cancer Maternal Aunt        in 108's  . Cancer Paternal Grandfather        brain cancer  . Cancer Maternal Grandmother        breast  . Breast cancer Maternal Grandmother   . Breast  cancer Paternal Grandmother     ROS: Review of Systems  Constitutional: Positive for malaise/fatigue. Negative for weight loss.  Gastrointestinal: Negative for nausea and vomiting.  Musculoskeletal:       Negative muscle weakness  Psychiatric/Behavioral: Positive for depression. Negative for suicidal ideas.       + Anxiety    PHYSICAL EXAM: Pt in no acute distress  RECENT LABS AND TESTS: BMET    Component Value Date/Time   NA 142 04/14/2019 1126   K 4.3 04/14/2019 1126   CL 101 04/14/2019 1126   CO2 23 04/14/2019 1126   GLUCOSE 79 04/14/2019 1126   GLUCOSE 113 (H) 06/20/2017 0856   BUN 13 04/14/2019 1126   CREATININE 0.80 04/14/2019 1126   CALCIUM 9.1 04/14/2019 1126   GFRNONAA 93 04/14/2019 1126   GFRAA 107 04/14/2019 1126   Lab Results  Component Value Date   HGBA1C 5.5 04/14/2019   HGBA1C 5.4 07/07/2018   HGBA1C 6.0 (H) 12/30/2017   HGBA1C 5.8 02/21/2017   Lab Results  Component Value Date   INSULIN 28.4 (H) 04/14/2019   INSULIN 18.0 07/07/2018   INSULIN 89.7 (H) 12/30/2017   CBC    Component Value Date/Time   WBC 9.3 12/30/2017 1147   WBC 9.6 06/20/2017 0856   RBC 5.18 12/30/2017 1147   RBC 5.04 06/20/2017 0856   HGB 13.4 12/30/2017 1147   HCT 43.1 12/30/2017 1147   PLT 224.0 06/20/2017 0856   MCV 83 12/30/2017 1147   MCH 25.9 (L) 12/30/2017 1147   MCH 24.1 (L) 04/27/2017 2055   MCHC 31.1 (L) 12/30/2017 1147   MCHC 32.7 06/20/2017 0856   RDW 13.6 12/30/2017 1147   LYMPHSABS 2.0 12/30/2017 1147    MONOABS 0.6 02/21/2017 1510   EOSABS 0.2 12/30/2017 1147   BASOSABS 0.0 12/30/2017 1147   Iron/TIBC/Ferritin/ %Sat    Component Value Date/Time   IRON 85 06/20/2017 0856   TIBC 344 06/20/2017 0856   FERRITIN 55.4 06/20/2017 0856   IRONPCTSAT 25 06/20/2017 0856   Lipid Panel     Component Value Date/Time   CHOL 119 04/14/2019 1126   TRIG 60 04/14/2019 1126   HDL 47 04/14/2019 1126   CHOLHDL 3.3 04/08/2017 1124   CHOLHDL 4.7 02/22/2017 0420   VLDL 11 02/22/2017 0420   LDLCALC 60 04/14/2019 1126   Hepatic Function Panel     Component Value Date/Time   PROT 6.4 04/14/2019 1126   ALBUMIN 4.1 04/14/2019 1126   AST 15 04/14/2019 1126   ALT 17 04/14/2019 1126   ALKPHOS 67 04/14/2019 1126   BILITOT 0.3 04/14/2019 1126   BILIDIR 0.4 04/14/2017 0315   IBILI 0.7 04/14/2017 0315      Component Value Date/Time   TSH 1.270 12/30/2017 1147   TSH 3.01 06/20/2017 0856   TSH 4.78 (H) 02/21/2017 1510      I, Burt Knack, am acting as transcriptionist for Debbra Riding, MD   I have reviewed the above documentation for accuracy and completeness, and I agree with the above. - Debbra Riding, MD

## 2019-05-12 ENCOUNTER — Telehealth: Payer: Self-pay | Admitting: Cardiology

## 2019-05-12 NOTE — Telephone Encounter (Signed)
Spoke with patient who confirmed all demographics. Patient has a smart phone and uses My Chart. Will have vitals ready for visit. °

## 2019-05-13 NOTE — Progress Notes (Signed)
Virtual Visit via Video Note   This visit type was conducted due to national recommendations for restrictions regarding the COVID-19 Pandemic (e.g. social distancing) in an effort to limit this patient's exposure and mitigate transmission in our community.  Due to her co-morbid illnesses, this patient is at least at moderate risk for complications without adequate follow up.  This format is felt to be most appropriate for this patient at this time.  All issues noted in this document were discussed and addressed.  A limited physical exam was performed with this format.  Please refer to the patient's chart for her consent to telehealth for [facility].   Date:  05/15/2019   ID:  Amber Bentley, DOB December 27, 1975, MRN [id number]  Patient Location: Home Provider Location: Home  PCP:  Anne Ng, NP  Cardiologist:  Kristeen Miss, MD Electrophysiologist:  None   Evaluation Performed:  Follow-Up Visit  Chief Complaint:  Follow up for CHF and HTN, seen for Dr. Elease Hashimoto   History of Present Illness:    Amber Bentley is a 43 y.o. female with a hx of HTN, remote tobacco use, obesity, pulmonary HTN, and possible sleep apnea.   Amber Bentley was initially seen for follow up of a hospitalization for HTN emergency in which her BP's were noted to be markedly elevated and untreated for many years. She was started no antihypertensives at that time. Echocardiogram showed an LVEF of 45-50% with diffuse hypokinesis and G2DD. She continued to smoke.   She was then seen 04/08/2017 as an acute visit for chest pain described as a heaviness that lasted for several hours in duration and was related to upper body movement. It was not worse with exertion. She has a significant family hx of CAD. Her chest pain was thought to be very atypical and not thought to be cardiac in etiology.   When seen in follow up several months later, she had continued to lose weight and was down to 302lb. Her BP was well  controlled and had no symptoms of PHN. Her echo was repeated 08/29/2018 and her LV function was improved to 60-65% with PA pressure of 38.   Today, she is doing well.  Her BPs have been higher than normal when seen at her healthy weight clinic visit.  She reports SBP's in the 1 40-1 60 range which is abnormal for her.  We discussed increasing her carvedilol and monitoring BPs with a log.  She continues with increased activity and weight loss programming.  Does have symptoms, known to Dr. Elease Hashimoto of intermittent, sporadic midsternal chest heaviness in which she states comes on with increased rest, lack of sleep or if she is feeling ill.  She denies associated shortness of breath, nausea, vomiting or diaphoresis.  She has no exertional features.  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  She feels this very musculoskeletal.  Since she has lost weight after her initial HF diagnosis, she feels that her chest pressure has improved.  Dr. Elease Hashimoto and her have discussed stress testing however given her morbid obesity would likely get skewed results.  She is not a candidate for coronary CT given her weight.  We discussed cardiac cath in which we both do not feel that she needs at this time.  Likely this is in the setting of morbid obesity and hypertension which we will work on.  The patient does not have symptoms concerning for COVID-19 infection (fever, chills, cough,  or new shortness of breath).   Past Medical History:  Diagnosis Date   Anemia    Anxiety    Cardiomegaly    Chest pain    CHF (congestive heart failure) (HCC)    Constipation    Depression    Dyspnea    Fatigue    Food allergy    celery   GERD (gastroesophageal reflux disease)    Hip pain    HLD (hyperlipidemia)    HTN (hypertension)    Infertility, female    Lactose intolerance    Leg edema    Lower back pain    OSA on CPAP    Palpitations    Prediabetes    Shoulder  pain    Stomach ulcer    Varicose vein of leg    Past Surgical History:  Procedure Laterality Date   ABDOMINAL HYSTERECTOMY     ENDOMETRIAL ABLATION  2007   prior to hysterectomy     Current Meds  Medication Sig   busPIRone (BUSPAR) 7.5 MG tablet Take 1 tablet (7.5 mg total) by mouth 2 (two) times daily.   losartan (COZAAR) 50 MG tablet Take 1 tablet (50 mg total) by mouth daily.   metFORMIN (GLUCOPHAGE) 500 MG tablet TAKE 1 TABLET BY MOUTH ONCE DAILY WITH BREAKFAST   pantoprazole (PROTONIX) 40 MG tablet TAKE 1 TABLET BY MOUTH TWICE DAILY   rosuvastatin (CRESTOR) 10 MG tablet Take 1 tablet (10 mg total) by mouth daily.   spironolactone (ALDACTONE) 25 MG tablet Take 1 tablet (25 mg total) by mouth daily.   Vitamin D, Ergocalciferol, (DRISDOL) 1.25 MG (50000 UT) CAPS capsule Take 1 capsule (50,000 Units total) by mouth every 7 (seven) days.   vortioxetine HBr (TRINTELLIX) 20 MG TABS tablet Take 1 tablet (20 mg total) by mouth daily.   [DISCONTINUED] carvedilol (COREG) 6.25 MG tablet Take 1 tablet (6.25 mg total) by mouth 2 (two) times daily.   [DISCONTINUED] rosuvastatin (CRESTOR) 10 MG tablet TAKE 1 TABLET BY MOUTH DAILY.     Allergies:   Food and Aleve [naproxen]   Social History   Tobacco Use   Smoking status: Former Smoker    Packs/day: 0.25    Years: 10.00    Pack years: 2.50    Types: Cigarettes   Smokeless tobacco: Never Used  Substance Use Topics   Alcohol use: Yes    Comment: rarely   Drug use: No     Family Hx: The patient's family history includes Anxiety disorder in her father; Breast cancer in her maternal aunt, maternal grandmother, mother, and paternal grandmother; Cancer in her maternal aunt, maternal grandmother, mother, and paternal grandfather; Depression in her father and mother; Diabetes in her father; Heart disease (age of onset: 7) in her father; Hyperlipidemia in her father; Hypertension in her father and mother; Kidney disease in  her mother; Obesity in her father and mother; Sleep apnea in her father; Sudden death in her brother; Thyroid disease in her sister.  ROS:   Please see the history of present illness.     All other systems reviewed and are negative.  Prior CV studies:   The following studies were reviewed today:  Echocardiogram 08/29/2018: Study Conclusions  - Left ventricle: The cavity size was normal. There was mild focal   basal hypertrophy of the septum. Systolic function was normal.   The estimated ejection fraction was in the range of 60% to 65%.   Wall motion was normal; there were no regional wall motion  abnormalities. Left ventricular diastolic function parameters   were normal. - Right atrium: The atrium was mildly dilated. - Pulmonary arteries: Systolic pressure was mildly increased. PA   peak pressure: 38 mm Hg (S).  Echocardiogram 02/22/2017: Study Conclusions  - Left ventricle: The cavity size was mildly dilated. Wall   thickness was increased in a pattern of moderate LVH. Systolic   function was mildly reduced. The estimated ejection fraction was   in the range of 45% to 50%. Diffuse hypokinesis. Features are   consistent with a pseudonormal left ventricular filling pattern,   with concomitant abnormal relaxation and increased filling   pressure (grade 2 diastolic dysfunction). Doppler parameters are   consistent with high ventricular filling pressure. - Aortic valve: Transvalvular velocity was within the normal range.   There was no stenosis. There was no regurgitation. - Mitral valve: Transvalvular velocity was within the normal range.   There was no evidence for stenosis. There was trivial   regurgitation. - Right ventricle: The cavity size was normal. Wall thickness was   normal. Systolic function was normal. - Atrial septum: No defect or patent foramen ovale was identified. - Tricuspid valve: There was mild regurgitation. - Pulmonary arteries: Systolic pressure was  severely increased. PA   peak pressure: 59 mm Hg (S). - Pericardium, extracardiac: A small pericardial effusion was   identified circumferential to the heart.  Labs/Other Tests and Data Reviewed:    EKG:  No ECG reviewed.  Recent Labs: 04/14/2019: ALT 17; BUN 13; Creatinine, Ser 0.80; Potassium 4.3; Sodium 142   Recent Lipid Panel Lab Results  Component Value Date/Time   CHOL 119 04/14/2019 11:26 AM   TRIG 60 04/14/2019 11:26 AM   HDL 47 04/14/2019 11:26 AM   CHOLHDL 3.3 04/08/2017 11:24 AM   CHOLHDL 4.7 02/22/2017 04:20 AM   LDLCALC 60 04/14/2019 11:26 AM    Wt Readings from Last 3 Encounters:  05/15/19 (!) 305 lb (138.3 kg)  03/09/19 (!) 314 lb (142.4 kg)  02/12/19 (!) 315 lb (142.9 kg)     Objective:    Vital Signs:  Ht 5\' 7"  (1.702 m)    Wt (!) 305 lb (138.3 kg)    BMI 47.77 kg/m    VITAL SIGNS:  reviewed GEN:  no acute distress EYES:  sclerae anicteric, EOMI - Extraocular Movements Intact RESPIRATORY:  normal respiratory effort, symmetric expansion SKIN:  no rash, lesions or ulcers. MUSCULOSKELETAL:  no obvious deformities. NEURO:  alert and oriented x 3, no obvious focal deficit PSYCH:  normal affect  ASSESSMENT & PLAN:    1. Hx of systolic and diastolic HF: -Pt now with improved LV function per echo 08/2018. LV 60-65% with improved PA pressure  -Continue carvedilol, losartan, spironolactone -Will increase carvedilol to 12.5 mg twice daily  2. HTN: -Unkown however reports increased SBP's at wellness weight clinic appointments recently -Increase carvedilol to 12.5 mg twice daily.  Patient is to keep a BP log over the next several weeks.  We will schedule her for a follow-up and make medication adjustments as needed. -Continue losartan, spironolactone, carvedilol as above  3. Pulmonary HTN: -Improved per last echo -PA pressure -Compliant with CPAP   4. Obesity: -Weight, 305 today -Weight 314lb on 05/29 -Continues to exercise and watch diet  -Follows  with weight management clinic, last seen 05/04/2019  5.  Chest pressure/pain: -This is been chronic, ongoing.  Patient reports discomfort with increased stress, lack of sleep or acute illness.  No exertional quality.  No  shortness of breath, nausea, vomiting or diaphoresis. -Discussed stress testing versus coronary CT which she is not an ideal candidate given her size.  She is discussed this with Dr. Elease HashimotoNahser.  I will send this note to him for further recommendations. -Patient does state that the symptoms have decreased since her HF diagnosis and weight loss -Likely in the setting of morbid obesity, hypertension -May need further work-up in the future   COVID-19 Education: The signs and symptoms of COVID-19 were discussed with the patient and how to seek care for testing (follow up with PCP or arrange E-visit). The importance of social distancing was discussed today.  Time:   Today, I have spent minutes with the patient with telehealth technology discussing the above problems.     Medication Adjustments/Labs and Tests Ordered: Current medicines are reviewed at length with the patient today.  Concerns regarding medicines are outlined above.   Tests Ordered: No orders of the defined types were placed in this encounter.   Medication Changes: Meds ordered this encounter  Medications   rosuvastatin (CRESTOR) 10 MG tablet    Sig: Take 1 tablet (10 mg total) by mouth daily.    Dispense:  90 tablet    Refill:  3   carvedilol (COREG) 12.5 MG tablet    Sig: Take 1 tablet (12.5 mg total) by mouth 2 (two) times daily.    Dispense:  180 tablet    Refill:  3    Disposition:  Follow up with APP in 1 month and Dr. Elease HashimotoNahser in 6 months  Signed, Georgie ChardJill Florida Nolton, NP  05/15/2019 4:22 PM    [facility]

## 2019-05-14 ENCOUNTER — Encounter (INDEPENDENT_AMBULATORY_CARE_PROVIDER_SITE_OTHER): Payer: Self-pay

## 2019-05-15 ENCOUNTER — Telehealth (INDEPENDENT_AMBULATORY_CARE_PROVIDER_SITE_OTHER): Payer: 59 | Admitting: Cardiology

## 2019-05-15 ENCOUNTER — Encounter: Payer: Self-pay | Admitting: Cardiology

## 2019-05-15 ENCOUNTER — Other Ambulatory Visit: Payer: Self-pay

## 2019-05-15 VITALS — Ht 67.0 in | Wt 305.0 lb

## 2019-05-15 DIAGNOSIS — I5042 Chronic combined systolic (congestive) and diastolic (congestive) heart failure: Secondary | ICD-10-CM

## 2019-05-15 DIAGNOSIS — I5022 Chronic systolic (congestive) heart failure: Secondary | ICD-10-CM

## 2019-05-15 DIAGNOSIS — I10 Essential (primary) hypertension: Secondary | ICD-10-CM

## 2019-05-15 DIAGNOSIS — I110 Hypertensive heart disease with heart failure: Secondary | ICD-10-CM

## 2019-05-15 MED ORDER — CARVEDILOL 12.5 MG PO TABS: 12.5000 mg | ORAL_TABLET | Freq: Two times a day (BID) | ORAL | 3 refills | Status: DC

## 2019-05-15 MED ORDER — ROSUVASTATIN CALCIUM 10 MG PO TABS: 10.0000 mg | ORAL_TABLET | Freq: Every day | ORAL | 3 refills | Status: DC

## 2019-05-15 MED FILL — CARVEDILOL 12.5 MG TABLET: 12.5 | 90 days supply | Qty: 180 | Fill #0

## 2019-05-15 MED FILL — ROSUVASTATIN CALCIUM 10 MG: 10 | 90 days supply | Qty: 90 | Fill #0

## 2019-05-15 NOTE — Patient Instructions (Signed)
Medication Instructions:  Your physician has recommended you make the following change in your medication:  1.  INCREASE the Carvedilol to 12.5 taking 1 tablet twice a day.  You may take 2 of the 6.25 mg's to use them up.  If you need a refill on your cardiac medications before your next appointment, please call your pharmacy.   Lab work: None ordered  If you have labs (blood work) drawn today and your tests are completely normal, you will receive your results only by: [name] MyChart Message (if you have MyChart) OR . A paper copy in the mail If you have any lab test that is abnormal or we need to change your treatment, we will call you to review the results.  Testing/Procedures: None ordered  Follow-Up: At [facility], you and your health needs are our priority.  As part of our continuing mission to provide you with exceptional heart care, we have created designated Provider Care Teams.  These Care Teams include your primary Cardiologist (physician) and Advanced Practice Providers (APPs -  Physician Assistants and Nurse Practitioners) who all work together to provide you with the care you need, when you need it. . YOU ARE SCHEDULED FOR ANOTHER VIRTUAL VISIT VIA VIDEO, LIKE TODAY'S APPOINTMENT, WITH JILL MCDANIEL, 06/16/2019 AT 3:00.  Any Other Special Instructions Will Be Listed Below (If Applicable).

## 2019-05-18 ENCOUNTER — Encounter (INDEPENDENT_AMBULATORY_CARE_PROVIDER_SITE_OTHER): Payer: Self-pay

## 2019-05-18 ENCOUNTER — Ambulatory Visit (INDEPENDENT_AMBULATORY_CARE_PROVIDER_SITE_OTHER): Payer: Self-pay | Admitting: Family Medicine

## 2019-05-27 ENCOUNTER — Telehealth: Payer: Self-pay | Admitting: *Deleted

## 2019-05-27 NOTE — Telephone Encounter (Signed)
Due to current COVID 19 pandemic, our office is severely reducing in office visits until further notice, in order to minimize the risk to our patients and healthcare providers. Unable to get in contact with the patient to convert their office visit with Megan on 06/01/2019 into a doxy.me visit. I left a voicemail asking the patient to return my call. Office number was provided.     If patient calls back please convert their office visit into a doxy.me visit.       

## 2019-05-30 DIAGNOSIS — I5041 Acute combined systolic (congestive) and diastolic (congestive) heart failure: Secondary | ICD-10-CM | POA: Diagnosis not present

## 2019-05-30 DIAGNOSIS — I5032 Chronic diastolic (congestive) heart failure: Secondary | ICD-10-CM | POA: Diagnosis not present

## 2019-05-30 DIAGNOSIS — I159 Secondary hypertension, unspecified: Secondary | ICD-10-CM | POA: Diagnosis not present

## 2019-06-01 ENCOUNTER — Ambulatory Visit: Payer: 59 | Admitting: Adult Health

## 2019-06-04 ENCOUNTER — Other Ambulatory Visit: Payer: Self-pay

## 2019-06-04 ENCOUNTER — Ambulatory Visit (INDEPENDENT_AMBULATORY_CARE_PROVIDER_SITE_OTHER): Payer: 59 | Admitting: Family Medicine

## 2019-06-04 DIAGNOSIS — Z6841 Body Mass Index (BMI) 40.0 and over, adult: Secondary | ICD-10-CM

## 2019-06-04 DIAGNOSIS — F418 Other specified anxiety disorders: Secondary | ICD-10-CM | POA: Diagnosis not present

## 2019-06-04 DIAGNOSIS — E559 Vitamin D deficiency, unspecified: Secondary | ICD-10-CM

## 2019-06-08 NOTE — Progress Notes (Signed)
Office: [phone]  /  Fax: [phone] TeleHealth Visit:  Amber PonderRobin Annette Bentley has verbally consented to this TeleHealth visit today. The patient is located at home, the provider is located at the [facility] office. The participants in this visit include the listed provider and patient. The visit was conducted today via webex.  HPI:   Chief Complaint: OBESITY Amber Bentley is here to discuss her progress with her obesity treatment plan. She is on the keep a food journal with 1450-1600 calories and 100+ grams of protein daily and is following her eating plan approximately 75-80 % of the time. She states she is exercising 0 minutes 0 times per week. Amber Bentley has been very busy with work and familial stress. She is fairly concerned about exposure risk while at work. She is noticing fairly significant carbohydrate cravings secondary to fatigue and convenice.  We were unable to weigh the patient today for this TeleHealth visit. She feels as if she has maintained her weight since her last visit. She has lost 0 lbs since starting treatment with us.  Vitamin D Deficiency Amber Bentley has a diagnosis of vitamin D deficiency. She is currently taking prescription Vit D. She notes fatigue and denies nausea, vomiting or muscle weakness.  Depression with Anxiety Amber Bentley struggles with depression with anxiety. She notes increased fatigue and increased anxiety. She shows no sign of suicidal or homicidal ideations.  ASSESSMENT AND PLAN:  Vitamin D deficiency  Depression with anxiety  Class 3 severe obesity with serious comorbidity and body mass index (BMI) of 45.0 to 49.9 in adult, unspecified obesity type (HCC)  PLAN:  Vitamin D Deficiency Amber Bentley was informed that low vitamin D levels contributes to fatigue and are associated with obesity, breast, and colon cancer. Amber Bentley agrees to continue taking prescription Vit D 50,000 IU every week, no refill needed. She will follow up for routine testing of  vitamin D, at least 2-3 times per year. She was informed of the risk of over-replacement of vitamin D and agrees to not increase her dose unless she discusses this with us first. Amber Bentley agrees to follow up with our clinic in 3 weeks.  Depression with Anxiety We discussed behavior modification techniques today to help Amber Bentley deal with her depression and anxiety. Amber Bentley is to start Buspar every morning, and she agrees to follow up with our clinic in 3 weeks.  Obesity Amber Bentley is currently in the action stage of change. As such, her goal is to continue with weight loss efforts She has agreed to keep a food journal with 1450-1600 calories and 100+ grams of protein daily Amber Bentley has been instructed to work up to a goal of 150 minutes of combined cardio and strengthening exercise per week for weight loss and overall health benefits. We discussed the following Behavioral Modification Strategies today: increasing lean protein intake, increasing vegetables and work on meal planning and easy cooking plans, keeping healthy foods in the home, better snacking choices, planning for success, and keep a strict food journal   Amber Bentley has agreed to follow up with our clinic in 3 weeks. She was informed of the importance of frequent follow up visits to maximize her success with intensive lifestyle modifications for her multiple health conditions.  ALLERGIES: Allergies  Allergen Reactions  . 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   [name]. Aleve [Naproxen] Other (See Comments)    Pt states that this medication makes her loopy.     MEDICATIONS: Current Outpatient  Medications on File Prior to Visit  Medication Sig Dispense Refill  . busPIRone (BUSPAR) 7.5 MG tablet Take 1 tablet (7.5 mg total) by mouth 2 (two) times daily. 60 tablet 0  . carvedilol (COREG) 12.5 MG tablet Take 1 tablet (12.5 mg total) by mouth 2 (two) times daily. 180 tablet 3  . losartan (COZAAR) 50 MG tablet Take 1 tablet (50  mg total) by mouth daily. 30 tablet 11  . metFORMIN (GLUCOPHAGE) 500 MG tablet TAKE 1 TABLET BY MOUTH ONCE DAILY WITH BREAKFAST 90 tablet 0  . pantoprazole (PROTONIX) 40 MG tablet TAKE 1 TABLET BY MOUTH TWICE DAILY 180 tablet 1  . rosuvastatin (CRESTOR) 10 MG tablet Take 1 tablet (10 mg total) by mouth daily. 90 tablet 3  . spironolactone (ALDACTONE) 25 MG tablet Take 1 tablet (25 mg total) by mouth daily. 90 tablet 3  . Vitamin D, Ergocalciferol, (DRISDOL) 1.25 MG (50000 UT) CAPS capsule Take 1 capsule (50,000 Units total) by mouth every 7 (seven) days. 4 capsule 0  . vortioxetine HBr (TRINTELLIX) 20 MG TABS tablet Take 1 tablet (20 mg total) by mouth daily. 30 tablet 0   No current facility-administered medications on file prior to visit.     PAST MEDICAL HISTORY: Past Medical History:  Diagnosis Date  . Anemia   . Anxiety   . Cardiomegaly   . Chest pain   . CHF (congestive heart failure) ([place])   . Constipation   . Depression   . Dyspnea   . Fatigue   . Food allergy    celery  . GERD (gastroesophageal reflux disease)   . Hip pain   . HLD (hyperlipidemia)   . HTN (hypertension)   . Infertility, female   . Lactose intolerance   . Leg edema   . Lower back pain   . OSA on CPAP   . Palpitations   . Prediabetes   . Shoulder pain   . Stomach ulcer   . Varicose vein of leg     PAST SURGICAL HISTORY: Past Surgical History:  Procedure Laterality Date  . ABDOMINAL HYSTERECTOMY    . ENDOMETRIAL ABLATION  2007   prior to hysterectomy    SOCIAL HISTORY: Social History   Tobacco Use  . Smoking status: Former Smoker    Packs/day: 0.25    Years: 10.00    Pack years: 2.50    Types: Cigarettes  . Smokeless tobacco: Never Used  Substance Use Topics  . Alcohol use: Yes    Comment: rarely  . Drug use: No    FAMILY HISTORY: Family History  Problem Relation Age of Onset  . Hypertension Mother   . Cancer Mother        breast cancer  . Kidney disease Mother   .  Depression Mother   . Obesity Mother   . Breast cancer Mother   . Diabetes Father   . Heart disease Father 39  . Hyperlipidemia Father   . Hypertension Father   . Depression Father   . Anxiety disorder Father   . Sleep apnea Father   . Obesity Father   . Thyroid disease Sister   . Sudden death Brother   . Cancer Maternal Aunt        breast cancer  . Breast cancer Maternal Aunt        in 64's  . Cancer Paternal Grandfather        brain cancer  . Cancer Maternal Grandmother  breast  . Breast cancer Maternal Grandmother   . Breast cancer Paternal Grandmother     ROS: Review of Systems  Constitutional: Positive for malaise/fatigue. Negative for weight loss.  Gastrointestinal: Negative for nausea and vomiting.  Musculoskeletal:       Negative muscle weakness  Psychiatric/Behavioral: Positive for depression. Negative for suicidal ideas.       + Anxiety    PHYSICAL EXAM: Pt in no acute distress  RECENT LABS AND TESTS: BMET    Component Value Date/Time   NA 142 04/14/2019 1126   K 4.3 04/14/2019 1126   CL 101 04/14/2019 1126   CO2 23 04/14/2019 1126   GLUCOSE 79 04/14/2019 1126   GLUCOSE 113 (H) 06/20/2017 0856   BUN 13 04/14/2019 1126   CREATININE 0.80 04/14/2019 1126   CALCIUM 9.1 04/14/2019 1126   GFRNONAA 93 04/14/2019 1126   GFRAA 107 04/14/2019 1126   Lab Results  Component Value Date   HGBA1C 5.5 04/14/2019   HGBA1C 5.4 07/07/2018   HGBA1C 6.0 (H) 12/30/2017   HGBA1C 5.8 02/21/2017   Lab Results  Component Value Date   INSULIN 28.4 (H) 04/14/2019   INSULIN 18.0 07/07/2018   INSULIN 89.7 (H) 12/30/2017   CBC    Component Value Date/Time   WBC 9.3 12/30/2017 1147   WBC 9.6 06/20/2017 0856   RBC 5.18 12/30/2017 1147   RBC 5.04 06/20/2017 0856   HGB 13.4 12/30/2017 1147   HCT 43.1 12/30/2017 1147   PLT 224.0 06/20/2017 0856   MCV 83 12/30/2017 1147   MCH 25.9 (L) 12/30/2017 1147   MCH 24.1 (L) 04/27/2017 2055   MCHC 31.1 (L) 12/30/2017  1147   MCHC 32.7 06/20/2017 0856   RDW 13.6 12/30/2017 1147   LYMPHSABS 2.0 12/30/2017 1147   MONOABS 0.6 02/21/2017 1510   EOSABS 0.2 12/30/2017 1147   BASOSABS 0.0 12/30/2017 1147   Iron/TIBC/Ferritin/ %Sat    Component Value Date/Time   IRON 85 06/20/2017 0856   TIBC 344 06/20/2017 0856   FERRITIN 55.4 06/20/2017 0856   IRONPCTSAT 25 06/20/2017 0856   Lipid Panel     Component Value Date/Time   CHOL 119 04/14/2019 1126   TRIG 60 04/14/2019 1126   HDL 47 04/14/2019 1126   CHOLHDL 3.3 04/08/2017 1124   CHOLHDL 4.7 02/22/2017 0420   VLDL 11 02/22/2017 0420   LDLCALC 60 04/14/2019 1126   Hepatic Function Panel     Component Value Date/Time   PROT 6.4 04/14/2019 1126   ALBUMIN 4.1 04/14/2019 1126   AST 15 04/14/2019 1126   ALT 17 04/14/2019 1126   ALKPHOS 67 04/14/2019 1126   BILITOT 0.3 04/14/2019 1126   BILIDIR 0.4 04/14/2017 0315   IBILI 0.7 04/14/2017 0315      Component Value Date/Time   TSH 1.270 12/30/2017 1147   TSH 3.01 06/20/2017 0856   TSH 4.78 (H) 02/21/2017 1510      I, Burt Knack, am acting as transcriptionist for Debbra Riding, MD   I have reviewed the above documentation for accuracy and completeness, and I agree with the above. - Debbra Riding, MD

## 2019-06-10 ENCOUNTER — Telehealth: Payer: Self-pay | Admitting: *Deleted

## 2019-06-10 NOTE — Telephone Encounter (Signed)
Call placed to pt re: appt 06/16/2019.  Need to change to inoffice and prescreen for covid19. Left a message for pt to call back.

## 2019-06-14 NOTE — Progress Notes (Signed)
Cardiology Office Note   Date:  06/16/2019   ID:  Amber Bentley, DOB 04/13/1976, MRN [id number]  PCP:  Anne NgNche, Charlotte Lum, NP  Cardiologist: Dr. Melburn PopperNasher, MD   No chief complaint on file.  History of Present Illness: Amber Bentley is a 43 y.o. female with a hx of HTN, remote tobacco use, obesity, pulmonary HTN, and possible sleep apnea.   Amber Bentley was initially seen for follow up of a hospitalization for HTN emergency in which her BP's were noted to be markedly elevated and untreated for many years. She was started no antihypertensives at that time. Echocardiogram showed an LVEF of 45-50% with diffuse hypokinesis and G2DD. She continued to smoke.   She was then seen 04/08/2017 as an acute visit for chest pain described as a heaviness that lasted for several hours in duration and was related to upper body movement. It was not worse with exertion. She has a significant family hx of CAD. Her chest pain was thought to be very atypical and not thought to be cardiac in etiology.   When seen in follow up several months later, she had continued to lose weight and was down to 302lb. Her BP was well controlled and had no symptoms of PHN. Her echo was repeated 08/29/2018 and her LV function was improved to 60-65% with PA pressure of 38.   Amber Bentley was last seen by myself on 05/15/2019. At that time her BPs were higher than normal when seen at her healthy weight clinic visit.  She reported SBP's in the 140-160 range which is abnormal for her.  We discussed increasing her carvedilol and monitoring BPs with a log. She continued with increasing activity and weight loss programming.  She continued to have intermittent, sporadic midsternal chest heaviness in which she stated would come on with increased rest, lack of sleep or if she is feeling ill.  Dr. Melburn PopperNasher has been aware as this has been an ongoing concern. She had no other associated symptoms. She works as a Emergency planning/management officerpre-k teacher and is  constantly doing activities with her children.  She felt as though this was very musculoskeletal.  Since she has lost weight, after her initial HF diagnosis, she feels that her chest pressure has improved.  Dr. Elease HashimotoNahser and her have discussed stress testing however given her morbid obesity would likely get skewed results.  he is not a candidate for coronary CT given her weight.  We discussed cardiac cath in which we both do not feel that she needs at this time. Likely this is in the setting of morbid obesity and hypertension which we will work on.  Today she presents for BP follow up. She reports that she has been feeling ok but her BP was markedly elevated on arrival today. She states that she has attempted several times to pick up her prescription for losartan from the pharmacy however they determined she had no further refills (this does not appear to be the case per our records).  Given this, she has not taken her losartan in over a week, otherwise has been taking her medications as prescribed.  She also reports approximately 25 to 30 pound weight gain since COVID-19 started. She continues to follow with a weight management clinic on a regular basis.  She has had several headaches however has contributed this to history of ocular migraines as a prodromal symptom.  Chest pain, palpitations, shortness of breath, PND, orthopnea, dizziness or syncope.  She has had some  dependent however present today.  During her last virtual visit she was to obtain a BP cuff for more regular monitoring but this has not occurred. She states that her arm is too large for the cuffs sold at the local stores.   Past Medical History:  Diagnosis Date   Anemia    Anxiety    Cardiomegaly    Chest pain    CHF (congestive heart failure) (HCC)    Constipation    Depression    Dyspnea    Fatigue    Food allergy    celery   GERD (gastroesophageal reflux disease)    Hip pain    HLD (hyperlipidemia)    HTN  (hypertension)    Infertility, female    Lactose intolerance    Leg edema    Lower back pain    OSA on CPAP    Palpitations    Prediabetes    Shoulder pain    Stomach ulcer    Varicose vein of leg     Past Surgical History:  Procedure Laterality Date   ABDOMINAL HYSTERECTOMY     ENDOMETRIAL ABLATION  2007   prior to hysterectomy    Current Outpatient Medications  Medication Sig Dispense Refill   busPIRone (BUSPAR) 7.5 MG tablet Take 1 tablet (7.5 mg total) by mouth 2 (two) times daily. 60 tablet 0   carvedilol (COREG) 12.5 MG tablet Take 1 tablet (12.5 mg total) by mouth 2 (two) times daily. 180 tablet 3   losartan (COZAAR) 50 MG tablet Take 1 tablet (50 mg total) by mouth daily. 30 tablet 11   metFORMIN (GLUCOPHAGE) 500 MG tablet TAKE 1 TABLET BY MOUTH ONCE DAILY WITH BREAKFAST 90 tablet 0   pantoprazole (PROTONIX) 40 MG tablet TAKE 1 TABLET BY MOUTH TWICE DAILY 180 tablet 1   rosuvastatin (CRESTOR) 10 MG tablet Take 1 tablet (10 mg total) by mouth daily. 90 tablet 3   spironolactone (ALDACTONE) 25 MG tablet Take 1 tablet (25 mg total) by mouth daily. 90 tablet 3   Vitamin D, Ergocalciferol, (DRISDOL) 1.25 MG (50000 UT) CAPS capsule Take 1 capsule (50,000 Units total) by mouth every 7 (seven) days. 4 capsule 0   No current facility-administered medications for this visit.     Allergies:   Food and Aleve [naproxen]    Social History:  The patient  reports that she has quit smoking. Her smoking use included cigarettes. She has a 2.50 pack-year smoking history. She has never used smokeless tobacco. She reports current alcohol use. She reports that she does not use drugs.   Family History:  The patient's family history includes Anxiety disorder in her father; Breast cancer in her maternal aunt, maternal grandmother, mother, and paternal grandmother; Cancer in her maternal aunt, maternal grandmother, mother, and paternal grandfather; Depression in her father and  mother; Diabetes in her father; Heart disease (age of onset: 21) in her father; Hyperlipidemia in her father; Hypertension in her father and mother; Kidney disease in her mother; Obesity in her father and mother; Sleep apnea in her father; Sudden death in her brother; Thyroid disease in her sister.   ROS:  Please see the history of present illness.   Otherwise, review of systems are positive for none. All other systems are reviewed and negative.   PHYSICAL EXAM: VS:  Ht 5\' 7"  (1.702 m)    Wt (!) 337 lb 12.8 oz (153.2 kg)    BMI 52.91 kg/m  , BMI Body mass index is 52.91  kg/m.    General: Obese, NAD Skin: Warm, dry, intact  Head: Normocephalic, atraumatic, sclera non-icteric, no xanthomas, clear, moist mucus membranes. Neck: Negative for carotid bruits. No JVD Lungs:Clear to ausculation bilaterally. No wheezes, rales, or rhonchi. Breathing is unlabored. Cardiovascular: RRR with S1 S2. No murmurs, rubs, gallops, or LV heave appreciated. Extremities: No edema. No clubbing or cyanosis. DP/PT pulses 1+ bilaterally Neuro: Alert and oriented. No focal deficits. No facial asymmetry. MAE spontaneously. Psych: Responds to questions appropriately with normal affect.     EKG:  EKG is ordered today. The ekg ordered today demonstrates NSR with no acute changes    Recent Labs: 04/14/2019: ALT 17; BUN 13; Creatinine, Ser 0.80; Potassium 4.3; Sodium 142    Lipid Panel    Component Value Date/Time   CHOL 119 04/14/2019 1126   TRIG 60 04/14/2019 1126   HDL 47 04/14/2019 1126   CHOLHDL 3.3 04/08/2017 1124   CHOLHDL 4.7 02/22/2017 0420   VLDL 11 02/22/2017 0420   LDLCALC 60 04/14/2019 1126     Wt Readings from Last 3 Encounters:  06/16/19 (!) 337 lb 12.8 oz (153.2 kg)  05/15/19 (!) 305 lb (138.3 kg)  03/09/19 (!) 314 lb (142.4 kg)     Other studies Reviewed: Additional studies/ records that were reviewed today include:   Echocardiogram 08/29/2018: Study Conclusions  - Left ventricle:  The cavity size was normal. There was mild focal basal hypertrophy of the septum. Systolic function was normal. The estimated ejection fraction was in the range of 60% to 65%. Wall motion was normal; there were no regional wall motion abnormalities. Left ventricular diastolic function parameters were normal. - Right atrium: The atrium was mildly dilated. - Pulmonary arteries: Systolic pressure was mildly increased. PA peak pressure: 38 mm Hg (S).  Echocardiogram 02/22/2017: Study Conclusions  - Left ventricle: The cavity size was mildly dilated. Wall thickness was increased in a pattern of moderate LVH. Systolic function was mildly reduced. The estimated ejection fraction was in the range of 45% to 50%. Diffuse hypokinesis. Features are consistent with a pseudonormal left ventricular filling pattern, with concomitant abnormal relaxation and increased filling pressure (grade 2 diastolic dysfunction). Doppler parameters are consistent with high ventricular filling pressure. - Aortic valve: Transvalvular velocity was within the normal range. There was no stenosis. There was no regurgitation. - Mitral valve: Transvalvular velocity was within the normal range. There was no evidence for stenosis. There was trivial regurgitation. - Right ventricle: The cavity size was normal. Wall thickness was normal. Systolic function was normal. - Atrial septum: No defect or patent foramen ovale was identified. - Tricuspid valve: There was mild regurgitation. - Pulmonary arteries: Systolic pressure was severely increased. PA peak pressure: 59 mm Hg (S). - Pericardium, extracardiac: A small pericardial effusion was identified circumferential to the heart.  ASSESSMENT AND PLAN:  1. HTN: -BP on arrival was markedly elevated with SBP in the 180-190 range. She was given one dose of clonidine 0.1mg  and BP was reassessed at 90min which remained elevated in the  180's. Final BP reading after approximately 64min showed a BP of 148/102.  -Will increase losartan to 100mg  QD and add hydralazine 25mg  BID (will not take TID). Will have her come back for close follow up on Thursday for further assessment. May need additional titration of medications at that time. Likely due to not taking losartan along with weight gain over the last several months  -Continue spironolactone, carvedilol   2. Hx of systolic and diastolic HF: -Pt  now with improved LV function per echo 08/2018. LV 60-65% with improved PA pressure  -Continue carvedilol, losartan, spironolactone -Carvedilol increased at last appointment to 12.5 mg twice daily -Weight gain not likely to be fluid retention>>no HF symptoms on exam  3. Pulmonary HTN: -Improved per last echo -PA pressure -Compliant with CPAP   4. Obesity: -Follows closely with weight management clinic -Has upcoming appointment    Current medicines are reviewed at length with the patient today.  The patient does not have concerns regarding medicines.  The following changes have been made:  Increase losartan to 100mg  QD and add hydralazine 25mg  BID to regimen   Labs/ tests ordered today include: None  No orders of the defined types were placed in this encounter.   Disposition:   FU with APP in 2 days    Signed, Georgie ChardJill Dennie Moltz, NP  06/16/2019 3:35 PM    [facility] [address], [place], [place]  [address] Phone: [phone]; Fax: [phone]

## 2019-06-15 ENCOUNTER — Other Ambulatory Visit: Payer: Self-pay | Admitting: Nurse Practitioner

## 2019-06-15 DIAGNOSIS — I10 Essential (primary) hypertension: Secondary | ICD-10-CM

## 2019-06-15 NOTE — Telephone Encounter (Signed)
Follow up    Left message to confirm and ask screening questions

## 2019-06-16 ENCOUNTER — Telehealth (INDEPENDENT_AMBULATORY_CARE_PROVIDER_SITE_OTHER): Payer: 59 | Admitting: Cardiology

## 2019-06-16 ENCOUNTER — Encounter: Payer: Self-pay | Admitting: *Deleted

## 2019-06-16 ENCOUNTER — Encounter: Payer: Self-pay | Admitting: Cardiology

## 2019-06-16 ENCOUNTER — Other Ambulatory Visit: Payer: Self-pay

## 2019-06-16 VITALS — BP 182/110 | HR 67 | Ht 67.0 in | Wt 337.8 lb

## 2019-06-16 DIAGNOSIS — I10 Essential (primary) hypertension: Secondary | ICD-10-CM | POA: Diagnosis not present

## 2019-06-16 DIAGNOSIS — I5042 Chronic combined systolic (congestive) and diastolic (congestive) heart failure: Secondary | ICD-10-CM

## 2019-06-16 DIAGNOSIS — I272 Pulmonary hypertension, unspecified: Secondary | ICD-10-CM

## 2019-06-16 MED ORDER — CLONIDINE HCL 0.1 MG PO TABS: 0.1000 mg | ORAL_TABLET | Freq: Once | ORAL | Status: DC

## 2019-06-16 MED ORDER — HYDRALAZINE HCL 25 MG PO TABS: 25.0000 mg | ORAL_TABLET | Freq: Two times a day (BID) | ORAL | 1 refills | Status: DC

## 2019-06-16 MED ORDER — LOSARTAN POTASSIUM 100 MG PO TABS: 100.0000 mg | ORAL_TABLET | Freq: Every day | ORAL | 1 refills | Status: DC

## 2019-06-16 MED FILL — LOSARTAN POTASSIUM 100 MG T: 100 | 30 days supply | Qty: 30 | Fill #0

## 2019-06-16 MED FILL — hydrALAZINE HCL 25 MG TABS: 25 | 45 days supply | Qty: 90 | Fill #0

## 2019-06-16 NOTE — Patient Instructions (Signed)
Medication Instructions:  Your physician has recommended you make the following change in your medication:  1.  INCREASE the Losartan to 100 mg PLEASE TAKE WHEN YOU PICK THIS UP 2.  START Hydralazine 25 take 1 tablet twice a day PLEASE TAKE WHEN YOU PICK UP  If you need a refill on your cardiac medications before your next appointment, please call your pharmacy.   Lab work: None ordered  If you have labs (blood work) drawn today and your tests are completely normal, you will receive your results only by: [name] MyChart Message (if you have MyChart) OR . A paper copy in the mail If you have any lab test that is abnormal or we need to change your treatment, we will call you to review the results.  Testing/Procedures: None orderd  Follow-Up: At [facility] you and your health needs are our priority.  As part of our continuing mission to provide you with exceptional heart care, we have created designated Provider Care Teams.  These Care Teams include your primary Cardiologist (physician) and Advanced Practice Providers (APPs -  Physician Assistants and Nurse Practitioners) who all work together to provide you with the care you need, when you need it. You will need a follow up appointment on Thursday, 06/18/2019 at 8:15.  ARRIVE 15 MINS EARLY FOR THIS APPOINTMENT, PLEASE.    Any Other Special Instructions Will Be Listed Below (If Applicable).

## 2019-06-18 ENCOUNTER — Ambulatory Visit (INDEPENDENT_AMBULATORY_CARE_PROVIDER_SITE_OTHER): Payer: 59 | Admitting: Physician Assistant

## 2019-06-18 ENCOUNTER — Other Ambulatory Visit: Payer: Self-pay

## 2019-06-18 ENCOUNTER — Encounter: Payer: Self-pay | Admitting: Physician Assistant

## 2019-06-18 VITALS — BP 148/98 | HR 67 | Ht 67.0 in | Wt 331.8 lb

## 2019-06-18 DIAGNOSIS — I10 Essential (primary) hypertension: Secondary | ICD-10-CM | POA: Diagnosis not present

## 2019-06-18 DIAGNOSIS — R0789 Other chest pain: Secondary | ICD-10-CM | POA: Diagnosis not present

## 2019-06-18 DIAGNOSIS — Z7189 Other specified counseling: Secondary | ICD-10-CM | POA: Diagnosis not present

## 2019-06-18 DIAGNOSIS — I5042 Chronic combined systolic (congestive) and diastolic (congestive) heart failure: Secondary | ICD-10-CM

## 2019-06-18 DIAGNOSIS — I272 Pulmonary hypertension, unspecified: Secondary | ICD-10-CM | POA: Diagnosis not present

## 2019-06-18 NOTE — Patient Instructions (Addendum)
Medication Instructions:  Your physician recommends that you continue on your current medications as directed. Please refer to the Current Medication list given to you today.  If you need a refill on your cardiac medications before your next appointment, please call your pharmacy.   Lab work: NONE ORDERED  TODAY   If you have labs (blood work) drawn today and your tests are completely normal, you will receive your results only by: [name] MyChart Message (if you have MyChart) OR . A paper copy in the mail If you have any lab test that is abnormal or we need to change your treatment, we will call you to review the results.  Testing/Procedures:   Follow-Up: in 2 to 3 weeks with Pharm[facility]d Hypertension Clinic  Any Other Special Instructions Will Be Listed Below (If Applicable).

## 2019-06-18 NOTE — Progress Notes (Signed)
Cardiology Office Note:    Date:  06/18/2019   ID:  Amber Bentley, DOB May 16, 1976, MRN [id number]  PCP:  Flossie Buffy, NP  Cardiologist:  Mertie Moores, MD  Electrophysiologist:  None   Referring MD: Flossie Buffy, NP   Chief Complaint  Patient presents with  . Hypertension    BP follow-up    History of Present Illness:    Amber Bentley is a 43 y.o. female with a history of hypertension, pulmonary hypertension, possible sleep apnea, and former tobacco use who is followed by Dr. Acie Fredrickson. Patient was initially seen in 02/2017 for follow-up of hospitalization for hypertensive emergency. At that time, patient reported high blood pressure for several years but had not been on any medications. Echo showed LVEF of 45-50% with diffuse hypokinesis and grade 2 diastolic dysfunction.   She was seen in 03/2017 as an acute visit for chest pain described as a heaviness that lasted for several hours in duration and was related to upper body movement. It was not worse with exertion. She has a significant family history of CAD. Her chest pain was thought to be very atypical and not thought to be cardiac in etiology.   At follow-up visit on 05/15/2019, patient continued to have sporadic midsternal chest heaviness which she stated would come on with increased rest, lack of sleep or if she is feeling ill. Patient felt like chest pain had improved some with weight loss. Dr. Acie Fredrickson has discussed further ischemic evaluation with patient. She is not a good candidate for stress test or coronary CT given morbid obesity. At this visit, Kathyrn Drown, NP, dicussed cardiac catheterization for further evaluation but patient did not feel like that was need at that time.   Patient was last seen on 06/06/2019 wit Kathyrn Drown, BP was markedly elevated on arrival with systolic BP in the 660 to 190 range. She was given one dose of Clonidine 0.1mg  and BP was reassessed after 15 minutes but systolic BP  remained elevated in the 180's. Final BP reading after approximately 30 minutes showed a BP of 148/102.  She had not taken her Losartan for over a week because she was having trouble picking it up from the Pharmacy. She reported taking all of her other medications as instructed.  Patient said she was not able to monitor her BP at home because her arm is too large for the cuffs sold at the local stores. She also reported a 25 to 30 lb weight gain since COVID-19 started. Losartan was increased to 100mg  daily and Hydralazine 25mg  twice daily was added (patient would not take three times daily).   Patient presents today for BP follow-up. Here alone. Patient doing well today but states she is anxious that her BP will still be too high and she will have to go to the ED. BP 148/98 today. Patient had another headache yesterday and some eye twitching but denies any today. She has a history of occular migraines. She states she has been stumbling recently which is unusual for her but denies any neurologic symptoms like difficulty speaking or one-sided weakness. She continues to have some chest heaviness/soreness at rest like she has reported previously and also had a brief pinching sensation in her chest this morning. This does not worsen with exertion. She had a little shortness of breath this morning but thinks it was due to anxiety. No orthopnea or PND when she wears her CPAP machine. She notes some mild ankle/pedal edema the last  couple of weeks. Weight is down 6 lbs from office visit 2 days ago. Patient has no other complaints at this time. She denies any symptoms of COVID-19 including cough, nasal congestion, fever, chills, body aches.   Past Medical History:  Diagnosis Date  . Anemia   . Anxiety   . Cardiomegaly   . Chest pain   . CHF (congestive heart failure) (HCC)   . Constipation   . Depression   . Dyspnea   . Fatigue   . Food allergy    celery  . GERD (gastroesophageal reflux disease)   . Hip pain    . HLD (hyperlipidemia)   . HTN (hypertension)   . Infertility, female   . Lactose intolerance   . Leg edema   . Lower back pain   . OSA on CPAP   . Palpitations   . Prediabetes   . Shoulder pain   . Stomach ulcer   . Varicose vein of leg     Past Surgical History:  Procedure Laterality Date  . ABDOMINAL HYSTERECTOMY    . ENDOMETRIAL ABLATION  2007   prior to hysterectomy    Current Medications: Current Meds  Medication Sig  . busPIRone (BUSPAR) 7.5 MG tablet Take 1 tablet (7.5 mg total) by mouth 2 (two) times daily.  . carvedilol (COREG) 12.5 MG tablet Take 1 tablet (12.5 mg total) by mouth 2 (two) times daily.  . hydrALAZINE (APRESOLINE) 25 MG tablet Take 1 tablet (25 mg total) by mouth 2 (two) times a day.  . losartan (COZAAR) 100 MG tablet Take 1 tablet (100 mg total) by mouth daily.  . metFORMIN (GLUCOPHAGE) 500 MG tablet TAKE 1 TABLET BY MOUTH ONCE DAILY WITH BREAKFAST  . pantoprazole (PROTONIX) 40 MG tablet TAKE 1 TABLET BY MOUTH TWICE DAILY  . rosuvastatin (CRESTOR) 10 MG tablet Take 1 tablet (10 mg total) by mouth daily.  [name]. spironolactone (ALDACTONE) 25 MG tablet Take 1 tablet (25 mg total) by mouth daily.  . Vitamin D, Ergocalciferol, (DRISDOL) 1.25 MG (50000 UT) CAPS capsule Take 1 capsule (50,000 Units total) by mouth every 7 (seven) days.   Current Facility-Administered Medications for the 06/18/19 encounter (Office Visit) with Manson PasseyBhagat, Bhavinkumar, PA  Medication  . cloNIDine (CATAPRES) tablet 0.1 mg     Allergies:   Food and Aleve [naproxen]   Social History   Socioeconomic History  . Marital status: Married    Spouse name: Victorino DikeJennifer  . Number of children: 2  . Years of education: MA  . Highest education level: Not on file  Occupational History  . Occupation: [employer]  Social Needs  . Financial resource strain: Not on file  . Food insecurity    Worry: Not on file    Inability: Not on file  . Transportation needs    Medical: Not  on file    Non-medical: Not on file  Tobacco Use  . Smoking status: Former Smoker    Packs/day: 0.25    Years: 10.00    Pack years: 2.50    Types: Cigarettes  . Smokeless tobacco: Never Used  Substance and Sexual Activity  . Alcohol use: Yes    Comment: rarely  . Drug use: No  . Sexual activity: Not on file  Lifestyle  . Physical activity    Days per week: Not on file    Minutes per session: Not on file  . Stress: Not on file  Relationships  . Social Musicianconnections    Talks on  phone: Not on file    Gets together: Not on file    Attends religious service: Not on file    Active member of club or organization: Not on file    Attends meetings of clubs or organizations: Not on file    Relationship status: Not on file  Other Topics Concern  . Not on file  Social History Narrative   Drinks coffee daily      Family History: The patient's family history includes Anxiety disorder in her father; Breast cancer in her maternal aunt, maternal grandmother, mother, and paternal grandmother; Cancer in her maternal aunt, maternal grandmother, mother, and paternal grandfather; Depression in her father and mother; Diabetes in her father; Heart disease (age of onset: 2135) in her father; Hyperlipidemia in her father; Hypertension in her father and mother; Kidney disease in her mother; Obesity in her father and mother; Sleep apnea in her father; Sudden death in her brother; Thyroid disease in her sister.  ROS:   Please see the history of present illness.    All other systems reviewed and are negative.  EKGs/Labs/Other Studies Reviewed:    The following studies were reviewed today:  Echocardiogram 08/29/2018: Study Conclusions: - Left ventricle: The cavity size was normal. There was mild focal   basal hypertrophy of the septum. Systolic function was normal.   The estimated ejection fraction was in the range of 60% to 65%.   Wall motion was normal; there were no regional wall motion    abnormalities. Left ventricular diastolic function parameters   were normal. - Right atrium: The atrium was mildly dilated. - Pulmonary arteries: Systolic pressure was mildly increased. PA   peak pressure: 38 mm Hg (S).  EKG:  EKG is not ordered today.   Recent Labs: 04/14/2019: ALT 17; BUN 13; Creatinine, Ser 0.80; Potassium 4.3; Sodium 142  Recent Lipid Panel    Component Value Date/Time   CHOL 119 04/14/2019 1126   TRIG 60 04/14/2019 1126   HDL 47 04/14/2019 1126   CHOLHDL 3.3 04/08/2017 1124   CHOLHDL 4.7 02/22/2017 0420   VLDL 11 02/22/2017 0420   LDLCALC 60 04/14/2019 1126    Physical Exam:    VS:  BP (!) 148/98   Pulse 67   Ht 5\' 7"  (1.702 m)   Wt (!) 331 lb 12.8 oz (150.5 kg)   SpO2 97%   BMI 51.97 kg/m     Wt Readings from Last 3 Encounters:  06/18/19 (!) 331 lb 12.8 oz (150.5 kg)  06/16/19 (!) 337 lb 12.8 oz (153.2 kg)  05/15/19 (!) 305 lb (138.3 kg)     GEN: Morbidly obese Caucasian female resting comfortably in no acute distress.   HEENT: Normal NECK: Supple. Difficult to assess JVD due to body habitus.  LYMPHATICS: No lymphadenopathy CARDIAC: RRR. Distinct S1 and S2. No murmurs, rubs, gallops RESPIRATORY: No increased work of breathing. Clear to auscultation bilaterally. No wheezes, rhonchi, or rales.  ABDOMEN: Soft, non-distended, and non-tender to palpation. Bowel sounds present. MUSCULOSKELETAL:  No edema. No deformity  SKIN: Warm and dry. NEUROLOGIC:  Alert and oriented x3. No focal deficits. PSYCHIATRIC: Normal affect. Responds appropriately.  ASSESSMENT:    1. Essential hypertension   2. Chronic combined systolic and diastolic CHF (congestive heart failure) (HCC)   3. Pulmonary hypertension, unspecified (HCC)   4. Morbid obesity (HCC)   5. Atypical chest pain   6. Educated About Covid-19 Virus Infection    PLAN:    Hypertension - BP improved from  last visit, 148/98 today.  - Patient reports being compliant with all her medications. Will  continue current regiment at this time: Losartan 100mg  daily, Coreg 12.5mg  twice daily, Hydralazine 25mg  twice daily, and Spironolactone 25mg  daily. - Patient not able to monitor her BP at home because she cannot find a cuff large enough. She has a visit with the weight clinic on 06/23/2019. They will check her BP at that time. Asked patient to send [name] a MyChart message letting us know what her BP is at that visit. Depending on reading, may increase Hydralazine to 50mg  twice daily at that time. - Will have patient follow-up with the HTN clinic in 2-3 weeks.   Chronic Systolic and Diastolic CHF - Most recent Echo from 08/29/2018 showed LVEF of 60-65% with no regional wall motion abnormalities. EF improved from 45-50% in 02/2017.  - Patient appears euvolemic on exam but somewhat difficult to truly assess due to body habitus. Weight down 6 lbs since office visit on 06/16/2019.  - Continue Losartan, Coreg, and Spironolactone.   Atypical Chest Pain - Patient continues to complain of occasional chest heaviness/soreness at rest. Not worse with exertion. Chest pain felt to be atypical and likely not cardiac in nature in the past. There have been discussions about possible ischemic evaluation with cardiac catheterization (patient not a good candidate for stress test or coronary CT due to morbid obesity); however, was not felt to be needed yet. Will continue to monitor. Advised patient to let us know if she develops any new chest pain or exertional chest pain.   Pulmonary Hypertension - Improved on most recent Echo in 08/2018 which showed PASP of 38 mmHg (PASP was 59 mmHg in 02/2017).  Obesity - Follows closely with weight management clinic. Has an appointment with them next week on 06/23/2019.  COVID-19 Education - Patient denies any symptoms of COVID-19 today. - Advised patient to continue to practice social distancing, wash hands, and wear a mask.    Medication Adjustments/Labs and Tests Ordered: Current  medicines are reviewed at length with the patient today.  Concerns regarding medicines are outlined above.  No orders of the defined types were placed in this encounter.  No orders of the defined types were placed in this encounter.   Patient Instructions  Medication Instructions:  Your physician recommends that you continue on your current medications as directed. Please refer to the Current Medication list given to you today.  If you need a refill on your cardiac medications before your next appointment, please call your pharmacy.   Lab work: NONE ORDERED  TODAY   If you have labs (blood work) drawn today and your tests are completely normal, you will receive your results only by: [name] MyChart Message (if you have MyChart) OR . A paper copy in the mail If you have any lab test that is abnormal or we need to change your treatment, we will call you to review the results.  Testing/Procedures:   Follow-Up: in 2 to 3 weeks with [facility]  Any Other Special Instructions Will Be Listed Below (If Applicable).       Signed, Corrin Parker, PA-C  06/18/2019 9:01 AM    [facility]

## 2019-06-21 ENCOUNTER — Encounter (INDEPENDENT_AMBULATORY_CARE_PROVIDER_SITE_OTHER): Payer: Self-pay | Admitting: Family Medicine

## 2019-06-22 NOTE — Telephone Encounter (Signed)
Please review

## 2019-06-23 ENCOUNTER — Encounter (INDEPENDENT_AMBULATORY_CARE_PROVIDER_SITE_OTHER): Payer: Self-pay | Admitting: Family Medicine

## 2019-06-23 ENCOUNTER — Other Ambulatory Visit: Payer: Self-pay

## 2019-06-23 ENCOUNTER — Ambulatory Visit (INDEPENDENT_AMBULATORY_CARE_PROVIDER_SITE_OTHER): Payer: 59 | Admitting: Family Medicine

## 2019-06-23 ENCOUNTER — Other Ambulatory Visit: Payer: Self-pay | Admitting: Nurse Practitioner

## 2019-06-23 VITALS — BP 154/88 | HR 71 | Temp 98.1°F | Ht 67.0 in | Wt 327.0 lb

## 2019-06-23 DIAGNOSIS — K219 Gastro-esophageal reflux disease without esophagitis: Secondary | ICD-10-CM | POA: Diagnosis not present

## 2019-06-23 DIAGNOSIS — Z6841 Body Mass Index (BMI) 40.0 and over, adult: Secondary | ICD-10-CM

## 2019-06-23 DIAGNOSIS — Z9189 Other specified personal risk factors, not elsewhere classified: Secondary | ICD-10-CM | POA: Diagnosis not present

## 2019-06-23 DIAGNOSIS — F418 Other specified anxiety disorders: Secondary | ICD-10-CM | POA: Diagnosis not present

## 2019-06-23 DIAGNOSIS — E559 Vitamin D deficiency, unspecified: Secondary | ICD-10-CM | POA: Diagnosis not present

## 2019-06-23 MED ORDER — PANTOPRAZOLE SODIUM 40 MG PO TBEC: 40.0000 mg | DELAYED_RELEASE_TABLET | Freq: Every day | ORAL | 0 refills | Status: DC

## 2019-06-23 MED ORDER — VITAMIN D (ERGOCALCIFEROL) 1.25 MG (50000 UNIT) PO CAPS: 50000.0000 [IU] | ORAL_CAPSULE | ORAL | 0 refills | Status: DC

## 2019-06-23 MED FILL — VIT D2 1.25 MG (50,000 UNIT: 1.25 MG | 28 days supply | Qty: 4 | Fill #0

## 2019-06-23 MED FILL — PANTOPRAZOLE SOD DR 40 MG T: 40 | 90 days supply | Qty: 90 | Fill #0

## 2019-06-24 NOTE — Progress Notes (Signed)
Office: [phone]  /  Fax: [phone]   HPI:   Chief Complaint: OBESITY Amber Bentley is here to discuss her progress with her obesity treatment plan. She is on the keep a food journal with 1450-1600 calories and 100+ grams of protein daily and is following her eating plan approximately 90 % of the time. She states she is exercising 0 minutes 0 times per week. Amber Bentley has had an interesting few weeks. She was seen at Cardiology clinic, and her blood pressure was exceptionally high 180's/110's. She had her medications changed.  Her weight is (!) 327 lb (148.3 kg) today and 13 since her last visit. She has lost 0 lbs since starting treatment with Amber Bentley.  Hypertension Amber Bentley is a 43 y.o. female with hypertension. Amber Bentley's blood pressure is elevated today. She denies chest pain, chest pressure, or headaches. She is working on weight loss to help control her blood pressure with the goal of decreasing her risk of heart attack and stroke.   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 She voices concern over consistent avoiding, dealing with past traumas. She shows no sign of suicidal or homicidal ideations.  Vitamin D Deficiency Amber Bentley has a diagnosis of vitamin D deficiency. She is currently taking prescription Vit D. She notes fatigue and denies nausea, vomiting or muscle weakness.  At risk for osteopenia and osteoporosis Amber Bentley is at higher risk of osteopenia and osteoporosis due to vitamin D deficiency.   GERD Amber Bentley has a diagnosis of GERD. She notes her symptoms are better controlled on Protonix.  ASSESSMENT AND PLAN:  Depression with anxiety - Plan: Ambulatory referral to Psychology  Vitamin D deficiency - Plan: Vitamin D, Ergocalciferol, (DRISDOL) 1.25 MG (50000 UT) CAPS capsule  Gastroesophageal reflux disease, esophagitis presence not specified  At risk for osteoporosis  Class 3 severe obesity with serious comorbidity and body mass index (BMI)  of 50.0 to 59.9 in adult, unspecified obesity type (HCC)  PLAN:  Hypertension We discussed sodium restriction, working on healthy weight loss, and a regular exercise program as the means to achieve improved blood pressure control. Amber Bentley agreed with this plan and agreed to follow up as directed. We will continue to monitor her blood pressure as well as her progress with the above lifestyle modifications. Amber Bentley agrees to continue her medications and will watch for signs of hypotension as she continues her lifestyle modifications. She is to follow up with Cardiology. Amber Bentley agrees to follow up with our clinic in 2 weeks.  Depression with Anxiety We discussed behavior modification techniques today to help Amber Bentley deal with her depression and anxiety. [facility] Amber Bentley agrees to follow up with our clinic in 2 weeks.  Vitamin D Deficiency Amber Bentley was informed that low vitamin D levels contributes to fatigue and are associated with obesity, breast, and colon cancer. Amber Bentley agrees to continue taking prescription Vit D 50,000 IU every week #4 and we will refill for 1 month. She will follow up for routine testing of vitamin D, at least 2-3 times per year. She was informed of the risk of over-replacement of vitamin D and agrees to not increase her dose unless she discusses this with Amber Bentley first. Amber Bentley agrees to follow up with our clinic in 2 weeks.  At risk for osteopenia and osteoporosis Amber Bentley was given extended (15 minutes) osteoporosis prevention counseling today. Amber Bentley is at risk for osteopenia and osteoporsis due to her vitamin D  deficiency. She was encouraged to take her vitamin D and follow her higher calcium diet and increase strengthening exercise to help strengthen her bones and decrease her risk of osteopenia and osteoporosis.  GERD Amber Bentley agrees to continue taking Protonix 40 mg PO daily #90 with no refills. Amber Bentley agrees to follow up with  our clinic in 2 weeks.  Obesity Amber Bentley is currently in the action stage of change. As such, her goal is to continue with weight loss efforts She has agreed to follow the Category 3 plan, at least 4 days a week. Amber Bentley has been instructed to work up to a goal of 150 minutes of combined cardio and strengthening exercise per week for weight loss and overall health benefits. We discussed the following Behavioral Modification Strategies today: increasing lean protein intake, increasing vegetables and work on meal planning and easy cooking plans, keeping healthy foods in the home, and planning for success   Amber Bentley has agreed to follow up with our clinic in 2 weeks. She was informed of the importance of frequent follow up visits to maximize her success with intensive lifestyle modifications for her multiple health conditions.  ALLERGIES: Allergies  Allergen Reactions  . 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   [name] [Naproxen] Other (See Comments)    Pt states that this medication makes her loopy.     MEDICATIONS: Current Outpatient Medications on File Prior to Visit  Medication Sig Dispense Refill  . busPIRone (BUSPAR) 7.5 MG tablet Take 1 tablet (7.5 mg total) by mouth 2 (two) times daily. 60 tablet 0  . carvedilol (COREG) 12.5 MG tablet Take 1 tablet (12.5 mg total) by mouth 2 (two) times daily. 180 tablet 3  . hydrALAZINE (APRESOLINE) 25 MG tablet Take 1 tablet (25 mg total) by mouth 2 (two) times a day. 90 tablet 1  . losartan (COZAAR) 100 MG tablet Take 1 tablet (100 mg total) by mouth daily. 30 tablet 1  . metFORMIN (GLUCOPHAGE) 500 MG tablet TAKE 1 TABLET BY MOUTH ONCE DAILY WITH BREAKFAST 90 tablet 0  . rosuvastatin (CRESTOR) 10 MG tablet Take 1 tablet (10 mg total) by mouth daily. 90 tablet 3  . spironolactone (ALDACTONE) 25 MG tablet Take 1 tablet (25 mg total) by mouth daily. 90 tablet 3   Current Facility-Administered Medications on File Prior to  Visit  Medication Dose Route Frequency Provider Last Rate Last Dose  . cloNIDine (CATAPRES) tablet 0.1 mg  0.1 mg Oral Once Filbert Schilder, NP        PAST MEDICAL HISTORY: Past Medical History:  Diagnosis Date  . Anemia   . Anxiety   . Cardiomegaly   . Chest pain   . CHF (congestive heart failure) (HCC)   . Constipation   . Depression   . Dyspnea   . Fatigue   . Food allergy    celery  . GERD (gastroesophageal reflux disease)   . Hip pain   . HLD (hyperlipidemia)   . HTN (hypertension)   . Infertility, female   . Lactose intolerance   . Leg edema   . Lower back pain   . OSA on CPAP   . Palpitations   . Prediabetes   . Shoulder pain   . Stomach ulcer   . Varicose vein of leg     PAST SURGICAL HISTORY: Past Surgical History:  Procedure Laterality Date  . ABDOMINAL HYSTERECTOMY    . ENDOMETRIAL ABLATION  2007  prior to hysterectomy    SOCIAL HISTORY: Social History   Tobacco Use  . Smoking status: Former Smoker    Packs/day: 0.25    Years: 10.00    Pack years: 2.50    Types: Cigarettes  . Smokeless tobacco: Never Used  Substance Use Topics  . Alcohol use: Yes    Comment: rarely  . Drug use: No    FAMILY HISTORY: Family History  Problem Relation Age of Onset  . Hypertension Mother   . Cancer Mother        breast cancer  . Kidney disease Mother   . Depression Mother   . Obesity Mother   . Breast cancer Mother   . Diabetes Father   . Heart disease Father [id number]  . Hyperlipidemia Father   . Hypertension Father   . Depression Father   . Anxiety disorder Father   . Sleep apnea Father   . Obesity Father   . Thyroid disease Sister   . Sudden death Brother   . Cancer Maternal Aunt        breast cancer  . Breast cancer Maternal Aunt        in 3350's  . Cancer Paternal Grandfather        brain cancer  . Cancer Maternal Grandmother        breast  . Breast cancer Maternal Grandmother   . Breast cancer Paternal Grandmother     ROS: Review of  Systems  Constitutional: Positive for malaise/fatigue. Negative for weight loss.  Cardiovascular: Negative for chest pain.       Negative chest pressure  Gastrointestinal: Negative for nausea and vomiting.  Musculoskeletal:       Negative muscle weakness  Psychiatric/Behavioral: Positive for depression. Negative for suicidal ideas.       + Anxiety    PHYSICAL EXAM: Blood pressure (!) 154/88, pulse 71, temperature 98.1 F (36.7 C), temperature source Oral, height 5\' 7"  (1.702 m), weight (!) 327 lb (148.3 kg), SpO2 96 %. Body mass index is 51.22 kg/m. Physical Exam Vitals signs reviewed.  Constitutional:      Appearance: Normal appearance. She is obese.  Cardiovascular:     Rate and Rhythm: Normal rate.     Pulses: Normal pulses.  Pulmonary:     Effort: Pulmonary effort is normal.     Breath sounds: Normal breath sounds.  Musculoskeletal: Normal range of motion.  Skin:    General: Skin is warm and dry.  Neurological:     Mental Status: She is alert and oriented to person, place, and time.  Psychiatric:        Mood and Affect: Mood normal.        Behavior: Behavior normal.     RECENT LABS AND TESTS: BMET    Component Value Date/Time   NA 142 04/14/2019 1126   K 4.3 04/14/2019 1126   CL 101 04/14/2019 1126   CO2 23 04/14/2019 1126   GLUCOSE 79 04/14/2019 1126   GLUCOSE 113 (H) 06/20/2017 0856   BUN 13 04/14/2019 1126   CREATININE 0.80 04/14/2019 1126   CALCIUM 9.1 04/14/2019 1126   GFRNONAA 93 04/14/2019 1126   GFRAA 107 04/14/2019 1126   Lab Results  Component Value Date   HGBA1C 5.5 04/14/2019   HGBA1C 5.4 07/07/2018   HGBA1C 6.0 (H) 12/30/2017   HGBA1C 5.8 02/21/2017   Lab Results  Component Value Date   INSULIN 28.4 (H) 04/14/2019   INSULIN 18.0 07/07/2018   INSULIN 89.7 (H)  12/30/2017   CBC    Component Value Date/Time   WBC 9.3 12/30/2017 1147   WBC 9.6 06/20/2017 0856   RBC 5.18 12/30/2017 1147   RBC 5.04 06/20/2017 0856   HGB 13.4  12/30/2017 1147   HCT 43.1 12/30/2017 1147   PLT 224.0 06/20/2017 0856   MCV 83 12/30/2017 1147   MCH 25.9 (L) 12/30/2017 1147   MCH 24.1 (L) 04/27/2017 2055   MCHC 31.1 (L) 12/30/2017 1147   MCHC 32.7 06/20/2017 0856   RDW 13.6 12/30/2017 1147   LYMPHSABS 2.0 12/30/2017 1147   MONOABS 0.6 02/21/2017 1510   EOSABS 0.2 12/30/2017 1147   BASOSABS 0.0 12/30/2017 1147   Iron/TIBC/Ferritin/ %Sat    Component Value Date/Time   IRON 85 06/20/2017 0856   TIBC 344 06/20/2017 0856   FERRITIN 55.4 06/20/2017 0856   IRONPCTSAT 25 06/20/2017 0856   Lipid Panel     Component Value Date/Time   CHOL 119 04/14/2019 1126   TRIG 60 04/14/2019 1126   HDL 47 04/14/2019 1126   CHOLHDL 3.3 04/08/2017 1124   CHOLHDL 4.7 02/22/2017 0420   VLDL 11 02/22/2017 0420   LDLCALC 60 04/14/2019 1126   Hepatic Function Panel     Component Value Date/Time   PROT 6.4 04/14/2019 1126   ALBUMIN 4.1 04/14/2019 1126   AST 15 04/14/2019 1126   ALT 17 04/14/2019 1126   ALKPHOS 67 04/14/2019 1126   BILITOT 0.3 04/14/2019 1126   BILIDIR 0.4 04/14/2017 0315   IBILI 0.7 04/14/2017 0315      Component Value Date/Time   TSH 1.270 12/30/2017 1147   TSH 3.01 06/20/2017 0856   TSH 4.78 (H) 02/21/2017 1510      OBESITY BEHAVIORAL INTERVENTION VISIT  Today's visit was # 30   Starting weight: 307 lbs Starting date: 12/30/17 Today's weight : 327 lbs Today's date: 06/23/2019 Total lbs lost to date: 0    ASK: We discussed the diagnosis of obesity with [name] today and Kaleyah agreed to give [name] permission to discuss obesity behavioral modification therapy today.  ASSESS: Dyamon has the diagnosis of obesity and her BMI today is 51.2 Setareh is in the action stage of change   ADVISE: Bettyann was educated on the multiple health risks of obesity as well as the benefit of weight loss to improve her health. She was advised of the need for long term treatment and the importance of lifestyle modifications  to improve her current health and to decrease her risk of future health problems.  AGREE: Multiple dietary modification options and treatment options were discussed and  Chaka agreed to follow the recommendations documented in the above note.  ARRANGE: Yilia was educated on the importance of frequent visits to treat obesity as outlined per CMS and USPSTF guidelines and agreed to schedule her next follow up appointment today.  I, Trixie Dredge, am acting as transcriptionist for Ilene Qua, MD  I have reviewed the above documentation for accuracy and completeness, and I agree with the above. - Ilene Qua, MD

## 2019-06-29 DIAGNOSIS — I159 Secondary hypertension, unspecified: Secondary | ICD-10-CM | POA: Diagnosis not present

## 2019-06-29 DIAGNOSIS — I5032 Chronic diastolic (congestive) heart failure: Secondary | ICD-10-CM | POA: Diagnosis not present

## 2019-06-29 DIAGNOSIS — I5041 Acute combined systolic (congestive) and diastolic (congestive) heart failure: Secondary | ICD-10-CM | POA: Diagnosis not present

## 2019-07-04 ENCOUNTER — Other Ambulatory Visit: Payer: Self-pay | Admitting: Internal Medicine

## 2019-07-04 DIAGNOSIS — Z20822 Contact with and (suspected) exposure to covid-19: Secondary | ICD-10-CM

## 2019-07-04 DIAGNOSIS — R6889 Other general symptoms and signs: Secondary | ICD-10-CM | POA: Diagnosis not present

## 2019-07-06 ENCOUNTER — Ambulatory Visit (INDEPENDENT_AMBULATORY_CARE_PROVIDER_SITE_OTHER): Payer: 59 | Admitting: Family Medicine

## 2019-07-06 ENCOUNTER — Other Ambulatory Visit: Payer: Self-pay

## 2019-07-06 ENCOUNTER — Encounter (INDEPENDENT_AMBULATORY_CARE_PROVIDER_SITE_OTHER): Payer: Self-pay | Admitting: Family Medicine

## 2019-07-06 VITALS — BP 120/73 | HR 71 | Temp 97.4°F | Ht 67.0 in | Wt 327.0 lb

## 2019-07-06 DIAGNOSIS — Z6841 Body Mass Index (BMI) 40.0 and over, adult: Secondary | ICD-10-CM

## 2019-07-06 DIAGNOSIS — Z9189 Other specified personal risk factors, not elsewhere classified: Secondary | ICD-10-CM | POA: Diagnosis not present

## 2019-07-06 DIAGNOSIS — F418 Other specified anxiety disorders: Secondary | ICD-10-CM | POA: Diagnosis not present

## 2019-07-06 DIAGNOSIS — R7303 Prediabetes: Secondary | ICD-10-CM

## 2019-07-06 DIAGNOSIS — E66813 Obesity, class 3: Secondary | ICD-10-CM

## 2019-07-06 MED ORDER — METFORMIN HCL 500 MG PO TABS: ORAL_TABLET | ORAL | 0 refills | Status: DC

## 2019-07-06 MED FILL — metFORMIN HCL 500 MG TABS: 500 | 90 days supply | Qty: 90 | Fill #0

## 2019-07-07 ENCOUNTER — Encounter (INDEPENDENT_AMBULATORY_CARE_PROVIDER_SITE_OTHER): Payer: Self-pay | Admitting: Family Medicine

## 2019-07-07 NOTE — Telephone Encounter (Signed)
Please review

## 2019-07-08 LAB — NOVEL CORONAVIRUS, NAA: SARS-CoV-2, NAA: NOT DETECTED

## 2019-07-09 NOTE — Progress Notes (Signed)
Office: [phone]  /  Fax: [phone]   HPI:   Chief Complaint: OBESITY Amber Bentley is here to discuss her progress with her obesity treatment plan. She is journaling 1450 to 1600 calories and 100 grams of protein 3 days a week and following the Category 3 plan 4 days a week and she is following her eating plan approximately 70 % of the time. 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 Amber Bentley liked going back to the Category 3 plan, as she felt more in control. She plans to go up to [place] next week, and go on vacation, then return with her kids in a few weeks. 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 Her weight is (!) 327 lb (148.3 kg) today and she has maintained weight since her last visit. She has lost 24 lbs since starting treatment with [name].  Pre-Diabetes Amber Bentley has a diagnosis of prediabetes based on her elevated Hgb A1c and was informed this puts her at greater risk of developing diabetes. Amber Bentley denies any GI side effects of metformin. She continues to work on diet and exercise to decrease risk of diabetes. She admits to occasional carb cravings.  At risk for diabetes Amber Bentley is at higher than average risk for developing diabetes due to her obesity. She currently denies polyuria or polydipsia.  GAD (generalized anxiety disorder) Amber Bentley has a diagnosis of generalized anxiety disorder and she is taking Buspar as needed. Some days she is feeling more fatigued, but she is also able to skip some days of medications.  ASSESSMENT AND PLAN:  Depression with anxiety  Prediabetes - Plan: metFORMIN (GLUCOPHAGE) 500 MG tablet  At risk for diabetes mellitus  Class 3 severe obesity with serious comorbidity and body mass index (BMI) of 50.0 to 59.9 in adult, unspecified obesity type Amber Bentley)  PLAN:  Pre-Diabetes Amber Bentley will continue to work on weight loss, exercise, and decreasing simple carbohydrates in her diet to help decrease the risk of  diabetes. We dicussed metformin including benefits and risks. She was informed that eating too many simple carbohydrates or too many calories at one sitting increases the likelihood of GI side effects. Arneshia agrees to continue metformin 500 mg qAM #30 with no refills and follow up with [name] as directed to monitor her progress.  Diabetes risk counseling Amber Bentley was given extended (15 minutes) diabetes prevention counseling today. She is 43 y.o. female and has risk factors for diabetes including obesity and prediabetes. We discussed intensive lifestyle modifications today with an emphasis on weight loss as well as increasing exercise and decreasing simple carbohydrates in her diet.  GAD (generalized anxiety disorder) Amber Bentley will continue Buspar as needed and she will follow up with our clinic at the agreed upon time.  Obesity Amber Bentley is currently in the action stage of change. As such, her goal is to continue with weight loss efforts She has agreed to keep a food journal with 1450 to 1600 calories and 100+ grams of protein daily and follow the Category 3 plan 4 to 5 days per week Amber Bentley has been instructed to work up to a goal of 150 minutes of combined cardio and strengthening exercise per week for weight loss and overall health benefits. We discussed the following Behavioral Modification Strategies today: planning for success, keeping healthy foods in the home, better snacking choices, increasing lean protein intake, increasing vegetables and work on meal planning and easy cooking plans  Amber Bentley has agreed to follow up with our clinic  in 2 to 3 weeks. She was informed of the importance of frequent follow up visits to maximize her success with intensive lifestyle modifications for her multiple health conditions.  ALLERGIES: Allergies  Allergen Reactions   Food Anaphylaxis and Other (See Comments)    Pt is allergic to celery.    Aleve [Naproxen] Other (See Comments)    Pt states that this medication  makes her loopy.     MEDICATIONS: Current Outpatient Medications on File Prior to Visit  Medication Sig Dispense Refill   busPIRone (BUSPAR) 7.5 MG tablet Take 1 tablet (7.5 mg total) by mouth 2 (two) times daily. 60 tablet 0   carvedilol (COREG) 12.5 MG tablet Take 1 tablet (12.5 mg total) by mouth 2 (two) times daily. 180 tablet 3   hydrALAZINE (APRESOLINE) 25 MG tablet Take 1 tablet (25 mg total) by mouth 2 (two) times a day. 90 tablet 1   losartan (COZAAR) 100 MG tablet Take 1 tablet (100 mg total) by mouth daily. 30 tablet 1   pantoprazole (PROTONIX) 40 MG tablet Take 1 tablet (40 mg total) by mouth daily. 90 tablet 0   rosuvastatin (CRESTOR) 10 MG tablet Take 1 tablet (10 mg total) by mouth daily. 90 tablet 3   spironolactone (ALDACTONE) 25 MG tablet Take 1 tablet (25 mg total) by mouth daily. 90 tablet 3   Vitamin D, Ergocalciferol, (DRISDOL) 1.25 MG (50000 UT) CAPS capsule Take 1 capsule (50,000 Units total) by mouth every 7 (seven) days. 4 capsule 0   Current Facility-Administered Medications on File Prior to Visit  Medication Dose Route Frequency Provider Last Rate Last Dose   cloNIDine (CATAPRES) tablet 0.1 mg  0.1 mg Oral Once Filbert SchilderMcDaniel, Jill D, NP        PAST MEDICAL HISTORY: Past Medical History:  Diagnosis Date   Anemia    Anxiety    Cardiomegaly    Chest pain    CHF (congestive heart failure) (HCC)    Constipation    Depression    Dyspnea    Fatigue    Food allergy    celery   GERD (gastroesophageal reflux disease)    Hip pain    HLD (hyperlipidemia)    HTN (hypertension)    Infertility, female    Lactose intolerance    Leg edema    Lower back pain    OSA on CPAP    Palpitations    Prediabetes    Shoulder pain    Stomach ulcer    Varicose vein of leg     PAST SURGICAL HISTORY: Past Surgical History:  Procedure Laterality Date   ABDOMINAL HYSTERECTOMY     ENDOMETRIAL ABLATION  2007   prior to hysterectomy     SOCIAL HISTORY: Social History   Tobacco Use   Smoking status: Former Smoker    Packs/day: 0.25    Years: 10.00    Pack years: 2.50    Types: Cigarettes   Smokeless tobacco: Never Used  Substance Use Topics   Alcohol use: Yes    Comment: rarely   Drug use: No    FAMILY HISTORY: Family History  Problem Relation Age of Onset   Hypertension Mother    Cancer Mother        breast cancer   Kidney disease Mother    Depression Mother    Obesity Mother    Breast cancer Mother    Diabetes Father    Heart disease Father [id number]   Hyperlipidemia Father    Hypertension  Father    Depression Father    Anxiety disorder Father    Sleep apnea Father    Obesity Father    Thyroid disease Sister    Sudden death Brother    Cancer Maternal Aunt        breast cancer   Breast cancer Maternal Aunt        in 4250's   Cancer Paternal Grandfather        brain cancer   Cancer Maternal Grandmother        breast   Breast cancer Maternal Grandmother    Breast cancer Paternal Grandmother     ROS: Review of Systems  Constitutional: Positive for malaise/fatigue. Negative for weight loss.  Gastrointestinal: Negative for diarrhea, nausea and vomiting.  Genitourinary: Negative for frequency.  Endo/Heme/Allergies: Negative for polydipsia.       Positive for carb cravings  Psychiatric/Behavioral: The patient is nervous/anxious.     PHYSICAL EXAM: Blood pressure 120/73, pulse 71, temperature (!) 97.4 F (36.3 C), temperature source Oral, height 5\' 7"  (1.702 m), weight (!) 327 lb (148.3 kg), SpO2 97 %. Body mass index is 51.22 kg/m. Physical Exam Vitals signs reviewed.  Constitutional:      Appearance: Normal appearance. She is well-developed. She is obese.  Cardiovascular:     Rate and Rhythm: Normal rate.  Pulmonary:     Effort: Pulmonary effort is normal.  Musculoskeletal: Normal range of motion.  Skin:    General: Skin is warm and dry.  Neurological:      Mental Status: She is alert and oriented to person, place, and time.  Psychiatric:        Mood and Affect: Mood normal.        Behavior: Behavior normal.     RECENT LABS AND TESTS: BMET    Component Value Date/Time   NA 142 04/14/2019 1126   K 4.3 04/14/2019 1126   CL 101 04/14/2019 1126   CO2 23 04/14/2019 1126   GLUCOSE 79 04/14/2019 1126   GLUCOSE 113 (H) 06/20/2017 0856   BUN 13 04/14/2019 1126   CREATININE 0.80 04/14/2019 1126   CALCIUM 9.1 04/14/2019 1126   GFRNONAA 93 04/14/2019 1126   GFRAA 107 04/14/2019 1126   Lab Results  Component Value Date   HGBA1C 5.5 04/14/2019   HGBA1C 5.4 07/07/2018   HGBA1C 6.0 (H) 12/30/2017   HGBA1C 5.8 02/21/2017   Lab Results  Component Value Date   INSULIN 28.4 (H) 04/14/2019   INSULIN 18.0 07/07/2018   INSULIN 89.7 (H) 12/30/2017   CBC    Component Value Date/Time   WBC 9.3 12/30/2017 1147   WBC 9.6 06/20/2017 0856   RBC 5.18 12/30/2017 1147   RBC 5.04 06/20/2017 0856   HGB 13.4 12/30/2017 1147   HCT 43.1 12/30/2017 1147   PLT 224.0 06/20/2017 0856   MCV 83 12/30/2017 1147   MCH 25.9 (L) 12/30/2017 1147   MCH 24.1 (L) 04/27/2017 2055   MCHC 31.1 (L) 12/30/2017 1147   MCHC 32.7 06/20/2017 0856   RDW 13.6 12/30/2017 1147   LYMPHSABS 2.0 12/30/2017 1147   MONOABS 0.6 02/21/2017 1510   EOSABS 0.2 12/30/2017 1147   BASOSABS 0.0 12/30/2017 1147   Iron/TIBC/Ferritin/ %Sat    Component Value Date/Time   IRON 85 06/20/2017 0856   TIBC 344 06/20/2017 0856   FERRITIN 55.4 06/20/2017 0856   IRONPCTSAT 25 06/20/2017 0856   Lipid Panel     Component Value Date/Time   CHOL 119 04/14/2019 1126  TRIG 60 04/14/2019 1126   HDL 47 04/14/2019 1126   CHOLHDL 3.3 04/08/2017 1124   CHOLHDL 4.7 02/22/2017 0420   VLDL 11 02/22/2017 0420   LDLCALC 60 04/14/2019 1126   Hepatic Function Panel     Component Value Date/Time   PROT 6.4 04/14/2019 1126   ALBUMIN 4.1 04/14/2019 1126   AST 15 04/14/2019 1126   ALT 17 04/14/2019  1126   ALKPHOS 67 04/14/2019 1126   BILITOT 0.3 04/14/2019 1126   BILIDIR 0.4 04/14/2017 0315   IBILI 0.7 04/14/2017 0315      Component Value Date/Time   TSH 1.270 12/30/2017 1147   TSH 3.01 06/20/2017 0856   TSH 4.78 (H) 02/21/2017 1510     Ref. Range 04/14/2019 11:26  Vitamin D, 25-Hydroxy Latest Ref Range: 30.0 - 100.0 ng/mL 38.7    OBESITY BEHAVIORAL INTERVENTION VISIT  Today's visit was # 31  Starting weight: 351 lbs Starting date: 12/30/2017 Today's weight : 327 lbs Today's date: 07/06/2019 Total lbs lost to date: 24    07/06/2019  Height 5\' 7"  (1.702 m)  Weight 327 lb (148.3 kg) (A)  BMI (Calculated) 51.2  BLOOD PRESSURE - SYSTOLIC 151  BLOOD PRESSURE - DIASTOLIC 73   Body Fat % 76.1 %  Total Body Water (lbs) 122.8 lbs    ASK: We discussed the diagnosis of obesity with Amber Bentley today and Amber Bentley agreed to give [name] permission to discuss obesity behavioral modification therapy today.  ASSESS: Amber Bentley has the diagnosis of obesity and her BMI today is 51.2 Amber Bentley is in the action stage of change   ADVISE: Amber Bentley was educated on the multiple health risks of obesity as well as the benefit of weight loss to improve her health. She was advised of the need for long term treatment and the importance of lifestyle modifications to improve her current health and to decrease her risk of future health problems.  AGREE: Multiple dietary modification options and treatment options were discussed and  Amber Bentley agreed to follow the recommendations documented in the above note.  ARRANGE: Amber Bentley was educated on the importance of frequent visits to treat obesity as outlined per CMS and USPSTF guidelines and agreed to schedule her next follow up appointment today.  I, Doreene Nest, am acting as transcriptionist for Eber Jones, MD  I have reviewed the above documentation for accuracy and completeness, and I agree with the above. - Ilene Qua, MD

## 2019-07-14 ENCOUNTER — Telehealth: Payer: Self-pay | Admitting: Pharmacist

## 2019-07-14 NOTE — Telephone Encounter (Signed)
Left message on machine for covid screening

## 2019-07-15 ENCOUNTER — Ambulatory Visit: Payer: 59 | Admitting: Pharmacist

## 2019-07-15 NOTE — Progress Notes (Deleted)
Patient ID: Amber Bentley                 DOB: May 01, 1976                      MRN: [id number]     HPI: Amber Bentley is a 43 y.o. female patient of Dr Acie Fredrickson referred by Amber Drown, NP to HTN clinic. PMH is significant for HTN, obesity, pulmonary HTN, possible OSA, and remote tobacco abuse.  Current HTN meds:  Previously tried:  BP goal:   Family History:   Social History:   Diet:   Exercise:   Home BP readings:   Wt Readings from Last 3 Encounters:  07/06/19 (!) 327 lb (148.3 kg)  06/23/19 (!) 327 lb (148.3 kg)  06/18/19 (!) 331 lb 12.8 oz (150.5 kg)   BP Readings from Last 3 Encounters:  07/06/19 120/73  06/23/19 (!) 154/88  06/18/19 (!) 148/98   Pulse Readings from Last 3 Encounters:  07/06/19 71  06/23/19 71  06/18/19 67    Renal function: CrCl cannot be calculated (Patient's most recent lab result is older than the maximum 21 days allowed.).  Past Medical History:  Diagnosis Date  . Anemia   . Anxiety   . Cardiomegaly   . Chest pain   . CHF (congestive heart failure) ([place])   . Constipation   . Depression   . Dyspnea   . Fatigue   . Food allergy    celery  . GERD (gastroesophageal reflux disease)   . Hip pain   . HLD (hyperlipidemia)   . HTN (hypertension)   . Infertility, female   . Lactose intolerance   . Leg edema   . Lower back pain   . OSA on CPAP   . Palpitations   . Prediabetes   . Shoulder pain   . Stomach ulcer   . Varicose vein of leg     Current Outpatient Medications on File Prior to Visit  Medication Sig Dispense Refill  . busPIRone (BUSPAR) 7.5 MG tablet Take 1 tablet (7.5 mg total) by mouth 2 (two) times daily. 60 tablet 0  . carvedilol (COREG) 12.5 MG tablet Take 1 tablet (12.5 mg total) by mouth 2 (two) times daily. 180 tablet 3  . hydrALAZINE (APRESOLINE) 25 MG tablet Take 1 tablet (25 mg total) by mouth 2 (two) times a day. 90 tablet 1  . losartan (COZAAR) 100 MG tablet Take 1 tablet (100 mg total) by  mouth daily. 30 tablet 1  . metFORMIN (GLUCOPHAGE) 500 MG tablet TAKE 1 TABLET BY MOUTH ONCE DAILY WITH BREAKFAST 90 tablet 0  . pantoprazole (PROTONIX) 40 MG tablet Take 1 tablet (40 mg total) by mouth daily. 90 tablet 0  . rosuvastatin (CRESTOR) 10 MG tablet Take 1 tablet (10 mg total) by mouth daily. 90 tablet 3  . spironolactone (ALDACTONE) 25 MG tablet Take 1 tablet (25 mg total) by mouth daily. 90 tablet 3  . Vitamin D, Ergocalciferol, (DRISDOL) 1.25 MG (50000 UT) CAPS capsule Take 1 capsule (50,000 Units total) by mouth every 7 (seven) days. 4 capsule 0   Current Facility-Administered Medications on File Prior to Visit  Medication Dose Route Frequency Provider Last Rate Last Dose  . cloNIDine (CATAPRES) tablet 0.1 mg  0.1 mg Oral Once Tommie Raymond, NP        Allergies  Allergen Reactions  . Food Anaphylaxis and Other (See Comments)    Pt is allergic  to celery.   [name] [Naproxen] Other (See Comments)    Pt states that this medication makes her loopy.      Assessment/Plan:  1. Hypertension -

## 2019-07-20 MED FILL — LOSARTAN POTASSIUM 100 MG T: 100 | 30 days supply | Qty: 30 | Fill #1

## 2019-07-22 ENCOUNTER — Ambulatory Visit (INDEPENDENT_AMBULATORY_CARE_PROVIDER_SITE_OTHER): Payer: 59 | Admitting: Family Medicine

## 2019-07-22 ENCOUNTER — Other Ambulatory Visit: Payer: Self-pay

## 2019-07-22 ENCOUNTER — Encounter (INDEPENDENT_AMBULATORY_CARE_PROVIDER_SITE_OTHER): Payer: Self-pay | Admitting: Family Medicine

## 2019-07-22 VITALS — BP 153/85 | HR 63 | Temp 97.9°F | Ht 67.0 in | Wt 328.0 lb

## 2019-07-22 DIAGNOSIS — Z9189 Other specified personal risk factors, not elsewhere classified: Secondary | ICD-10-CM

## 2019-07-22 DIAGNOSIS — E559 Vitamin D deficiency, unspecified: Secondary | ICD-10-CM | POA: Diagnosis not present

## 2019-07-22 DIAGNOSIS — Z6841 Body Mass Index (BMI) 40.0 and over, adult: Secondary | ICD-10-CM

## 2019-07-22 DIAGNOSIS — I10 Essential (primary) hypertension: Secondary | ICD-10-CM

## 2019-07-23 MED ORDER — VITAMIN D (ERGOCALCIFEROL) 1.25 MG (50000 UNIT) PO CAPS: 50000.0000 [IU] | ORAL_CAPSULE | ORAL | 0 refills | Status: DC

## 2019-07-23 MED FILL — VIT D2 1.25 MG (50,000 UNIT: 1.25 MG | 28 days supply | Qty: 4 | Fill #0

## 2019-07-23 NOTE — Progress Notes (Signed)
Office: [phone]  /  Fax: [phone]   HPI:   Chief Complaint: OBESITY Amber Bentley is here to discuss her progress with her obesity treatment plan. She is on the Category 3 plan and is following her eating plan approximately 80% of the time. She states she exercises by chasing children. Amber Bentley had a trip back [place] to [place] over the past few weeks. She has had a rough few weeks sticking to the plan secondary to kids' birthdays and eating off plan. She states the next few weeks are not appearing to be stressful.  Her weight is (!) 328 lb (148.8 kg) today and has had a weight gain of 1 lb since her last visit. She has lost 23 lbs since starting treatment with us.  Vitamin D deficiency Amber Bentley has a diagnosis of Vitamin D deficiency. She is currently taking prescription Vit D and denies nausea, vomiting or muscle weakness but does admit to fatigue.  At risk for osteopenia and osteoporosis Amber Bentley is at higher risk of osteopenia and osteoporosis due to Vitamin D deficiency.   Hypertension Amber Bentley Avelino Leedsnnette Ramsay is a 43 y.o. female with hypertension.  Dairl Ponderobin Annette Lyman denies chest pain, chest pressure, or headache. She is working weight loss to help control her blood pressure with the goal of decreasing her risk of heart attack and stroke. Pranavi's blood pressure is slightly elevated today.  ASSESSMENT AND PLAN:  Vitamin D deficiency  Essential hypertension  At risk for osteoporosis  Class 3 severe obesity with serious comorbidity and body mass index (BMI) of 50.0 to 59.9 in adult, unspecified obesity type (HCC)  PLAN:  Vitamin D Deficiency Amber Bentley was informed that low Vitamin D levels contributes to fatigue and are associated with obesity, breast, and colon cancer. She agrees to continue to take prescription Vit D @ 50,000 IU every week #4 with 0 refills and will follow-up for routine testing of Vitamin D, at least 2-3 times per year. She was informed of the risk of over-replacement of  Vitamin D and agrees to not increase her dose unless she discusses this with us first. Amber Bentley agrees to follow-up with our clinic in 2 weeks.  At risk for osteopenia and osteoporosis Amber Bentley was given extended  (15 minutes) osteoporosis prevention counseling today. Amber Bentley is at risk for osteopenia and osteoporosis due to her Vitamin D deficiency. She was encouraged to take her Vitamin D and follow her higher calcium diet and increase strengthening exercise to help strengthen her bones and decrease her risk of osteopenia and osteoporosis.  Hypertension We discussed sodium restriction, working on healthy weight loss, and a regular exercise program as the means to achieve improved blood pressure control. Amber Bentley agreed with this plan and agreed to follow up as directed. We will continue to monitor her blood pressure as well as her progress with the above lifestyle modifications. Amber Bentley will continue her current medications and will follow-up with Cardiology as previously scheduled. She will watch for signs of hypotension as she continues her lifestyle modifications.  Obesity Amber Bentley is currently in the action stage of change. As such, her goal is to continue with weight loss efforts. She has agreed to follow the Category 3 plan. Amber Bentley has been instructed to work up to a goal of 150 minutes of combined cardio and strengthening exercise per week for weight loss and overall health benefits. We discussed the following Behavioral Modification Strategies today: increasing lean protein intake, increasing vegetables, work on meal planning and easy cooking plans, keeping healthy foods in  the home, and planning for success.  Amber Bentley has agreed to follow-up with our clinic in 2-3 weeks. She was informed of the importance of frequent follow-up visits to maximize her success with intensive lifestyle modifications for her multiple health conditions.  ALLERGIES: Allergies  Allergen Reactions  . Food Anaphylaxis and Other  (See Comments)    Pt is allergic to celery.   [name]. Aleve [Naproxen] Other (See Comments)    Pt states that this medication makes her loopy.     MEDICATIONS: Current Outpatient Medications on File Prior to Visit  Medication Sig Dispense Refill  . busPIRone (BUSPAR) 7.5 MG tablet Take 1 tablet (7.5 mg total) by mouth 2 (two) times daily. 60 tablet 0  . carvedilol (COREG) 12.5 MG tablet Take 1 tablet (12.5 mg total) by mouth 2 (two) times daily. 180 tablet 3  . hydrALAZINE (APRESOLINE) 25 MG tablet Take 1 tablet (25 mg total) by mouth 2 (two) times a day. 90 tablet 1  . losartan (COZAAR) 100 MG tablet Take 1 tablet (100 mg total) by mouth daily. 30 tablet 1  . metFORMIN (GLUCOPHAGE) 500 MG tablet TAKE 1 TABLET BY MOUTH ONCE DAILY WITH BREAKFAST 90 tablet 0  . pantoprazole (PROTONIX) 40 MG tablet Take 1 tablet (40 mg total) by mouth daily. 90 tablet 0  . rosuvastatin (CRESTOR) 10 MG tablet Take 1 tablet (10 mg total) by mouth daily. 90 tablet 3  . spironolactone (ALDACTONE) 25 MG tablet Take 1 tablet (25 mg total) by mouth daily. 90 tablet 3  . Vitamin D, Ergocalciferol, (DRISDOL) 1.25 MG (50000 UT) CAPS capsule Take 1 capsule (50,000 Units total) by mouth every 7 (seven) days. 4 capsule 0   Current Facility-Administered Medications on File Prior to Visit  Medication Dose Route Frequency Provider Last Rate Last Dose  . cloNIDine (CATAPRES) tablet 0.1 mg  0.1 mg Oral Once Filbert SchilderMcDaniel, Jill D, NP        PAST MEDICAL HISTORY: Past Medical History:  Diagnosis Date  . Anemia   . Anxiety   . Cardiomegaly   . Chest pain   . CHF (congestive heart failure) ([facility])   . Constipation   . Depression   . Dyspnea   . Fatigue   . Food allergy    celery  . GERD (gastroesophageal reflux disease)   . Hip pain   . HLD (hyperlipidemia)   . HTN (hypertension)   . Infertility, female   . Lactose intolerance   . Leg edema   . Lower back pain   . OSA on CPAP   . Palpitations   . Prediabetes   . Shoulder  pain   . Stomach ulcer   . Varicose vein of leg     PAST SURGICAL HISTORY: Past Surgical History:  Procedure Laterality Date  . ABDOMINAL HYSTERECTOMY    . ENDOMETRIAL ABLATION  2007   prior to hysterectomy    SOCIAL HISTORY: Social History   Tobacco Use  . Smoking status: Former Smoker    Packs/day: 0.25    Years: 10.00    Pack years: 2.50    Types: Cigarettes  . Smokeless tobacco: Never Used  Substance Use Topics  . Alcohol use: Yes    Comment: rarely  . Drug use: No    FAMILY HISTORY: Family History  Problem Relation Age of Onset  . Hypertension Mother   . Cancer Mother        breast cancer  . Kidney disease Mother   . Depression Mother   .  Obesity Mother   . Breast cancer Mother   . Diabetes Father   . Heart disease Father 16  . Hyperlipidemia Father   . Hypertension Father   . Depression Father   . Anxiety disorder Father   . Sleep apnea Father   . Obesity Father   . Thyroid disease Sister   . Sudden death Brother   . Cancer Maternal Aunt        breast cancer  . Breast cancer Maternal Aunt        in 29's  . Cancer Paternal Grandfather        brain cancer  . Cancer Maternal Grandmother        breast  . Breast cancer Maternal Grandmother   . Breast cancer Paternal Grandmother    ROS: Review of Systems  Constitutional: Positive for malaise/fatigue.  Cardiovascular: Negative for chest pain.       Negative for chest pressure.  Gastrointestinal: Negative for nausea and vomiting.  Musculoskeletal:       Negative for muscle weakness.  Neurological: Negative for headaches.   PHYSICAL EXAM: Blood pressure (!) 153/85, pulse 63, temperature 97.9 F (36.6 C), temperature source Oral, height 5\' 7"  (1.702 m), weight (!) 328 lb (148.8 kg), SpO2 96 %. Body mass index is 51.37 kg/m. Physical Exam Vitals signs reviewed.  Constitutional:      Appearance: Normal appearance. She is obese.  Cardiovascular:     Rate and Rhythm: Normal rate.     Pulses:  Normal pulses.  Pulmonary:     Effort: Pulmonary effort is normal.     Breath sounds: Normal breath sounds.  Musculoskeletal: Normal range of motion.  Skin:    General: Skin is warm and dry.  Neurological:     Mental Status: She is alert and oriented to person, place, and time.  Psychiatric:        Behavior: Behavior normal.   RECENT LABS AND TESTS: BMET    Component Value Date/Time   NA 142 04/14/2019 1126   K 4.3 04/14/2019 1126   CL 101 04/14/2019 1126   CO2 23 04/14/2019 1126   GLUCOSE 79 04/14/2019 1126   GLUCOSE 113 (H) 06/20/2017 0856   BUN 13 04/14/2019 1126   CREATININE 0.80 04/14/2019 1126   CALCIUM 9.1 04/14/2019 1126   GFRNONAA 93 04/14/2019 1126   GFRAA 107 04/14/2019 1126   Lab Results  Component Value Date   HGBA1C 5.5 04/14/2019   HGBA1C 5.4 07/07/2018   HGBA1C 6.0 (H) 12/30/2017   HGBA1C 5.8 02/21/2017   Lab Results  Component Value Date   INSULIN 28.4 (H) 04/14/2019   INSULIN 18.0 07/07/2018   INSULIN 89.7 (H) 12/30/2017   CBC    Component Value Date/Time   WBC 9.3 12/30/2017 1147   WBC 9.6 06/20/2017 0856   RBC 5.18 12/30/2017 1147   RBC 5.04 06/20/2017 0856   HGB 13.4 12/30/2017 1147   HCT 43.1 12/30/2017 1147   PLT 224.0 06/20/2017 0856   MCV 83 12/30/2017 1147   MCH 25.9 (L) 12/30/2017 1147   MCH 24.1 (L) 04/27/2017 2055   MCHC 31.1 (L) 12/30/2017 1147   MCHC 32.7 06/20/2017 0856   RDW 13.6 12/30/2017 1147   LYMPHSABS 2.0 12/30/2017 1147   MONOABS 0.6 02/21/2017 1510   EOSABS 0.2 12/30/2017 1147   BASOSABS 0.0 12/30/2017 1147   Iron/TIBC/Ferritin/ %Sat    Component Value Date/Time   IRON 85 06/20/2017 0856   TIBC 344 06/20/2017 0856   FERRITIN  55.4 06/20/2017 0856   IRONPCTSAT 25 06/20/2017 0856   Lipid Panel     Component Value Date/Time   CHOL 119 04/14/2019 1126   TRIG 60 04/14/2019 1126   HDL 47 04/14/2019 1126   CHOLHDL 3.3 04/08/2017 1124   CHOLHDL 4.7 02/22/2017 0420   VLDL 11 02/22/2017 0420   LDLCALC 60  04/14/2019 1126   Hepatic Function Panel     Component Value Date/Time   PROT 6.4 04/14/2019 1126   ALBUMIN 4.1 04/14/2019 1126   AST 15 04/14/2019 1126   ALT 17 04/14/2019 1126   ALKPHOS 67 04/14/2019 1126   BILITOT 0.3 04/14/2019 1126   BILIDIR 0.4 04/14/2017 0315   IBILI 0.7 04/14/2017 0315      Component Value Date/Time   TSH 1.270 12/30/2017 1147   TSH 3.01 06/20/2017 0856   TSH 4.78 (H) 02/21/2017 1510   Results for RAETTA, HUESTIS (MRN [id number]) as of 07/23/2019 08:44  Ref. Range 04/14/2019 11:26  Vitamin D, 25-Hydroxy Latest Ref Range: 30.0 - 100.0 ng/mL 38.7   OBESITY BEHAVIORAL INTERVENTION VISIT  Today's visit was #32   Starting weight: 351 lbs Starting date: 12/30/2017 Today's weight: 328 lbs  Today's date: 07/22/2019 Total lbs lost to date: 23   07/22/2019  Height 5\' 7"  (1.702 m)  Weight 328 lb (148.8 kg) (A)  BMI (Calculated) 51.36  BLOOD PRESSURE - SYSTOLIC 153  BLOOD PRESSURE - DIASTOLIC 85   Body Fat % 50.3 %  Total Body Water (lbs) 127.6 lbs   ASK: We discussed the diagnosis of obesity with [name] today and Leanne agreed to give [name] permission to discuss obesity behavioral modification therapy today.  ASSESS: Chelsy has the diagnosis of obesity and her BMI today is 51.5. Asherah is in the action stage of change.   ADVISE: Yanick was educated on the multiple health risks of obesity as well as the benefit of weight loss to improve her health. She was advised of the need for long term treatment and the importance of lifestyle modifications to improve her current health and to decrease her risk of future health problems.  AGREE: Multiple dietary modification options and treatment options were discussed and  Avonna agreed to follow the recommendations documented in the above note.  ARRANGE: Nataliah was educated on the importance of frequent visits to treat obesity as outlined per CMS and USPSTF guidelines and agreed to schedule her next follow  up appointment today.  I, Marianna Payment, am acting as transcriptionist for Debbra Riding, MD   I have reviewed the above documentation for accuracy and completeness, and I agree with the above. - Debbra Riding, MD

## 2019-07-30 DIAGNOSIS — I5041 Acute combined systolic (congestive) and diastolic (congestive) heart failure: Secondary | ICD-10-CM | POA: Diagnosis not present

## 2019-07-30 DIAGNOSIS — I159 Secondary hypertension, unspecified: Secondary | ICD-10-CM | POA: Diagnosis not present

## 2019-07-30 DIAGNOSIS — I5032 Chronic diastolic (congestive) heart failure: Secondary | ICD-10-CM | POA: Diagnosis not present

## 2019-07-30 MED FILL — SPIRONOLACTONE 25 MG TABS: 25 | 90 days supply | Qty: 90 | Fill #2

## 2019-08-04 ENCOUNTER — Ambulatory Visit (INDEPENDENT_AMBULATORY_CARE_PROVIDER_SITE_OTHER): Payer: 59 | Admitting: Family Medicine

## 2019-08-04 ENCOUNTER — Other Ambulatory Visit: Payer: Self-pay

## 2019-08-04 ENCOUNTER — Encounter (INDEPENDENT_AMBULATORY_CARE_PROVIDER_SITE_OTHER): Payer: Self-pay | Admitting: Family Medicine

## 2019-08-04 VITALS — BP 119/77 | HR 67 | Temp 97.7°F | Ht 67.0 in | Wt 327.0 lb

## 2019-08-04 DIAGNOSIS — Z6841 Body Mass Index (BMI) 40.0 and over, adult: Secondary | ICD-10-CM | POA: Diagnosis not present

## 2019-08-04 DIAGNOSIS — R7303 Prediabetes: Secondary | ICD-10-CM

## 2019-08-04 DIAGNOSIS — I5020 Unspecified systolic (congestive) heart failure: Secondary | ICD-10-CM

## 2019-08-06 DIAGNOSIS — H5213 Myopia, bilateral: Secondary | ICD-10-CM | POA: Diagnosis not present

## 2019-08-11 NOTE — Progress Notes (Signed)
Office: [phone]  /  Fax: [phone]   HPI:   Chief Complaint: OBESITY Amber Bentley is here to discuss her progress with her obesity treatment plan. She is on the Category 3 plan and is following her eating plan approximately 100 % of the time. She states she is chasing kids and swimming for 45 minutes 3 times per week. 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 She is very concerned about the possibility of contracting COVID. She does mention dinner is particularly difficult.  Her weight is (!) 327 lb (148.3 kg) today and has had a weight loss of 1 pound over a period of 2 weeks since her last visit. She has lost 24 lbs since starting treatment with [name].  Pre-Diabetes Amber Bentley has a diagnosis of pre-diabetes based on her elevated Hgb A1c and was informed this puts her at greater risk of developing diabetes. Last Hgb A1c was 6.0 and insulin of 89.7 initially, but improved to 5.5 and 28.4. She is taking metformin currently and continues to work on diet and exercise to decrease risk of diabetes.   Congestive Heart Failure Amber Bentley has a diagnosis of CHF. She is on CHF cocktail and is managed by Cardiology. She denies symptoms of edema, dyspnea, or orthopnea.  ASSESSMENT AND PLAN:  Systolic heart failure, unspecified HF chronicity (HCC)  Prediabetes  Class 3 severe obesity with serious comorbidity and body mass index (BMI) of 50.0 to 59.9 in adult, unspecified obesity type [facility])  PLAN:  Pre-Diabetes Amber Bentley will continue to work on weight loss, exercise, and decreasing simple carbohydrates in her diet to help decrease the risk of diabetes. We dicussed metformin including benefits and risks. She was informed that eating too many simple carbohydrates or too many calories at one sitting increases the likelihood of GI side effects. Amber Bentley agrees to continue taking metformin, no refill needed. Amber Bentley agrees to follow up with our clinic in 2 weeks  as directed to monitor her progress.  Congestive Heart Failure Amber Bentley is to follow up with Cardiology at her previously scheduled appointment. Amber Bentley agrees to follow up with our clinic in 2 weeks.  I spent > than 50% of the 15 minute visit on counseling as documented in the note.  Obesity Amber Bentley is currently in the action stage of change. As such, her goal is to continue with weight loss efforts She has agreed to keep a food journal with 1450-1600 calories and 100+ grams of protein daily Amber Bentley has been instructed to work up to a goal of 150 minutes of combined cardio and strengthening exercise per week for weight loss and overall health benefits. We discussed the following Behavioral Modification Strategies today: increasing lean protein intake, increasing vegetables and work on meal planning and easy cooking plans, keeping healthy foods in the home, and planning for success   Amber Bentley has agreed to follow up with our clinic in 2 weeks. She was informed of the importance of frequent follow up visits to maximize her success with intensive lifestyle modifications for her multiple health conditions.  ALLERGIES: Allergies  Allergen Reactions  . 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   Tori Milks [Naproxen] Other (See Comments)    Pt states that this medication makes her loopy.     MEDICATIONS: Current Outpatient Medications on File Prior to Visit  Medication Sig Dispense Refill  . busPIRone (BUSPAR) 7.5 MG tablet Take 1 tablet (7.5 mg  total) by mouth 2 (two) times daily. 60 tablet 0  . carvedilol (COREG) 12.5 MG tablet Take 1 tablet (12.5 mg total) by mouth 2 (two) times daily. 180 tablet 3  . hydrALAZINE (APRESOLINE) 25 MG tablet Take 1 tablet (25 mg total) by mouth 2 (two) times a day. 90 tablet 1  . losartan (COZAAR) 100 MG tablet Take 1 tablet (100 mg total) by mouth daily. 30 tablet 1  . metFORMIN (GLUCOPHAGE) 500 MG tablet TAKE 1 TABLET BY MOUTH ONCE DAILY  WITH BREAKFAST 90 tablet 0  . pantoprazole (PROTONIX) 40 MG tablet Take 1 tablet (40 mg total) by mouth daily. 90 tablet 0  . rosuvastatin (CRESTOR) 10 MG tablet Take 1 tablet (10 mg total) by mouth daily. 90 tablet 3  . spironolactone (ALDACTONE) 25 MG tablet Take 1 tablet (25 mg total) by mouth daily. 90 tablet 3  . Vitamin D, Ergocalciferol, (DRISDOL) 1.25 MG (50000 UT) CAPS capsule Take 1 capsule (50,000 Units total) by mouth every 7 (seven) days. 4 capsule 0   Current Facility-Administered Medications on File Prior to Visit  Medication Dose Route Frequency Provider Last Rate Last Dose  . cloNIDine (CATAPRES) tablet 0.1 mg  0.1 mg Oral Once Filbert Schilder, NP        PAST MEDICAL HISTORY: Past Medical History:  Diagnosis Date  . Anemia   . Anxiety   . Cardiomegaly   . Chest pain   . CHF (congestive heart failure) (HCC)   . Constipation   . Depression   . Dyspnea   . Fatigue   . Food allergy    celery  . GERD (gastroesophageal reflux disease)   . Hip pain   . HLD (hyperlipidemia)   . HTN (hypertension)   . Infertility, female   . Lactose intolerance   . Leg edema   . Lower back pain   . OSA on CPAP   . Palpitations   . Prediabetes   . Shoulder pain   . Stomach ulcer   . Varicose vein of leg     PAST SURGICAL HISTORY: Past Surgical History:  Procedure Laterality Date  . ABDOMINAL HYSTERECTOMY    . ENDOMETRIAL ABLATION  2007   prior to hysterectomy    SOCIAL HISTORY: Social History   Tobacco Use  . Smoking status: Former Smoker    Packs/day: 0.25    Years: 10.00    Pack years: 2.50    Types: Cigarettes  . Smokeless tobacco: Never Used  Substance Use Topics  . Alcohol use: Yes    Comment: rarely  . Drug use: No    FAMILY HISTORY: Family History  Problem Relation Age of Onset  . Hypertension Mother   . Cancer Mother        breast cancer  . Kidney disease Mother   . Depression Mother   . Obesity Mother   . Breast cancer Mother   . Diabetes  Father   . Heart disease Father 41  . Hyperlipidemia Father   . Hypertension Father   . Depression Father   . Anxiety disorder Father   . Sleep apnea Father   . Obesity Father   . Thyroid disease Sister   . Sudden death Brother   . Cancer Maternal Aunt        breast cancer  . Breast cancer Maternal Aunt        in 82's  . Cancer Paternal Grandfather        brain cancer  .  Cancer Maternal Grandmother        breast  . Breast cancer Maternal Grandmother   . Breast cancer Paternal Grandmother     ROS: Review of Systems  Constitutional: Positive for weight loss.  Respiratory: Negative for shortness of breath.   Cardiovascular: Negative for orthopnea and leg swelling.    PHYSICAL EXAM: Blood pressure 119/77, pulse 67, temperature 97.7 F (36.5 C), temperature source Oral, height 5\' 7"  (1.702 m), weight (!) 327 lb (148.3 kg), SpO2 96 %. Body mass index is 51.22 kg/m. Physical Exam Vitals signs reviewed.  Constitutional:      Appearance: Normal appearance. She is obese.  Cardiovascular:     Rate and Rhythm: Normal rate.     Pulses: Normal pulses.  Pulmonary:     Effort: Pulmonary effort is normal.     Breath sounds: Normal breath sounds.  Musculoskeletal: Normal range of motion.  Skin:    General: Skin is warm and dry.  Neurological:     Mental Status: She is alert and oriented to person, place, and time.  Psychiatric:        Mood and Affect: Mood normal.        Behavior: Behavior normal.     RECENT LABS AND TESTS: BMET    Component Value Date/Time   NA 142 04/14/2019 1126   K 4.3 04/14/2019 1126   CL 101 04/14/2019 1126   CO2 23 04/14/2019 1126   GLUCOSE 79 04/14/2019 1126   GLUCOSE 113 (H) 06/20/2017 0856   BUN 13 04/14/2019 1126   CREATININE 0.80 04/14/2019 1126   CALCIUM 9.1 04/14/2019 1126   GFRNONAA 93 04/14/2019 1126   GFRAA 107 04/14/2019 1126   Lab Results  Component Value Date   HGBA1C 5.5 04/14/2019   HGBA1C 5.4 07/07/2018   HGBA1C 6.0 (H)  12/30/2017   HGBA1C 5.8 02/21/2017   Lab Results  Component Value Date   INSULIN 28.4 (H) 04/14/2019   INSULIN 18.0 07/07/2018   INSULIN 89.7 (H) 12/30/2017   CBC    Component Value Date/Time   WBC 9.3 12/30/2017 1147   WBC 9.6 06/20/2017 0856   RBC 5.18 12/30/2017 1147   RBC 5.04 06/20/2017 0856   HGB 13.4 12/30/2017 1147   HCT 43.1 12/30/2017 1147   PLT 224.0 06/20/2017 0856   MCV 83 12/30/2017 1147   MCH 25.9 (L) 12/30/2017 1147   MCH 24.1 (L) 04/27/2017 2055   MCHC 31.1 (L) 12/30/2017 1147   MCHC 32.7 06/20/2017 0856   RDW 13.6 12/30/2017 1147   LYMPHSABS 2.0 12/30/2017 1147   MONOABS 0.6 02/21/2017 1510   EOSABS 0.2 12/30/2017 1147   BASOSABS 0.0 12/30/2017 1147   Iron/TIBC/Ferritin/ %Sat    Component Value Date/Time   IRON 85 06/20/2017 0856   TIBC 344 06/20/2017 0856   FERRITIN 55.4 06/20/2017 0856   IRONPCTSAT 25 06/20/2017 0856   Lipid Panel     Component Value Date/Time   CHOL 119 04/14/2019 1126   TRIG 60 04/14/2019 1126   HDL 47 04/14/2019 1126   CHOLHDL 3.3 04/08/2017 1124   CHOLHDL 4.7 02/22/2017 0420   VLDL 11 02/22/2017 0420   LDLCALC 60 04/14/2019 1126   Hepatic Function Panel     Component Value Date/Time   PROT 6.4 04/14/2019 1126   ALBUMIN 4.1 04/14/2019 1126   AST 15 04/14/2019 1126   ALT 17 04/14/2019 1126   ALKPHOS 67 04/14/2019 1126   BILITOT 0.3 04/14/2019 1126   BILIDIR 0.4 04/14/2017 0315  IBILI 0.7 04/14/2017 0315      Component Value Date/Time   TSH 1.270 12/30/2017 1147   TSH 3.01 06/20/2017 0856   TSH 4.78 (H) 02/21/2017 1510      OBESITY BEHAVIORAL INTERVENTION VISIT  Today's visit was # 33   Starting weight: 351 lbs Starting date: 12/30/17 Today's weight : 327 lbs  Today's date: 08/04/2019 Total lbs lost to date: 24    ASK: We discussed the diagnosis of obesity with Amber Bentley today and Amber Bentley agreed to give us permission to discuss obesity behavioral modification therapy today.  ASSESS:  Amber Bentley has the diagnosis of obesity and her BMI today is 51.2 Amber Bentley is in the action stage of change   ADVISE: Amber Bentley was educated on the multiple health risks of obesity as well as the benefit of weight loss to improve her health. She was advised of the need for long term treatment and the importance of lifestyle modifications to improve her current health and to decrease her risk of future health problems.  AGREE: Multiple dietary modification options and treatment options were discussed and  Amber Bentley agreed to follow the recommendations documented in the above note.  ARRANGE: Amber Bentley was educated on the importance of frequent visits to treat obesity as outlined per CMS and USPSTF guidelines and agreed to schedule her next follow up appointment today.  I, Burt KnackSharon Martin, am acting as transcriptionist for Debbra RidingAlexandria Kadolph, MD  I have reviewed the above documentation for accuracy and completeness, and I agree with the above. - Debbra RidingAlexandria Kadolph, MD

## 2019-08-18 ENCOUNTER — Ambulatory Visit (INDEPENDENT_AMBULATORY_CARE_PROVIDER_SITE_OTHER): Payer: 59 | Admitting: Family Medicine

## 2019-08-19 ENCOUNTER — Encounter (INDEPENDENT_AMBULATORY_CARE_PROVIDER_SITE_OTHER): Payer: Self-pay

## 2019-08-28 ENCOUNTER — Encounter (INDEPENDENT_AMBULATORY_CARE_PROVIDER_SITE_OTHER): Payer: Self-pay | Admitting: Family Medicine

## 2019-08-30 DIAGNOSIS — I159 Secondary hypertension, unspecified: Secondary | ICD-10-CM | POA: Diagnosis not present

## 2019-08-30 DIAGNOSIS — I5032 Chronic diastolic (congestive) heart failure: Secondary | ICD-10-CM | POA: Diagnosis not present

## 2019-08-30 DIAGNOSIS — I5041 Acute combined systolic (congestive) and diastolic (congestive) heart failure: Secondary | ICD-10-CM | POA: Diagnosis not present

## 2019-09-02 ENCOUNTER — Other Ambulatory Visit: Payer: Self-pay | Admitting: Cardiology

## 2019-09-02 MED FILL — LOSARTAN POTASSIUM 100 MG T: 100 | 30 days supply | Qty: 30 | Fill #0

## 2019-09-08 ENCOUNTER — Encounter (INDEPENDENT_AMBULATORY_CARE_PROVIDER_SITE_OTHER): Payer: Self-pay | Admitting: Family Medicine

## 2019-09-08 ENCOUNTER — Other Ambulatory Visit: Payer: Self-pay

## 2019-09-08 ENCOUNTER — Ambulatory Visit (INDEPENDENT_AMBULATORY_CARE_PROVIDER_SITE_OTHER): Payer: 59 | Admitting: Family Medicine

## 2019-09-08 VITALS — BP 130/82 | HR 74 | Temp 98.5°F | Ht 67.0 in | Wt 329.0 lb

## 2019-09-08 DIAGNOSIS — Z6841 Body Mass Index (BMI) 40.0 and over, adult: Secondary | ICD-10-CM

## 2019-09-08 DIAGNOSIS — E559 Vitamin D deficiency, unspecified: Secondary | ICD-10-CM

## 2019-09-08 DIAGNOSIS — F418 Other specified anxiety disorders: Secondary | ICD-10-CM

## 2019-09-08 DIAGNOSIS — Z9189 Other specified personal risk factors, not elsewhere classified: Secondary | ICD-10-CM | POA: Diagnosis not present

## 2019-09-08 MED ORDER — BUSPIRONE HCL 7.5 MG PO TABS: 7.5000 mg | ORAL_TABLET | Freq: Two times a day (BID) | ORAL | 0 refills | Status: DC

## 2019-09-08 MED ORDER — VITAMIN D (ERGOCALCIFEROL) 1.25 MG (50000 UNIT) PO CAPS: 50000.0000 [IU] | ORAL_CAPSULE | ORAL | 0 refills | Status: DC

## 2019-09-08 MED FILL — VIT D2 1.25 MG (50,000 UNIT: 1.25 MG | 28 days supply | Qty: 4 | Fill #0

## 2019-09-08 MED FILL — ROSUVASTATIN CALCIUM 10 MG: 10 | 90 days supply | Qty: 90 | Fill #1

## 2019-09-08 MED FILL — BUSPIRONE HCL 7.5 MG TABS: 7.5 | 30 days supply | Qty: 60 | Fill #0

## 2019-09-10 NOTE — Progress Notes (Signed)
Office: [phone]  /  Fax: [phone]   HPI:   Chief Complaint: OBESITY Amber Bentley is here to discuss her progress with her obesity treatment plan. She is on the keep a food journal with 1450-1600 calories and 100+ grams of protein daily and is following her eating plan approximately 85-90 % of the time. She states she is swimming or chasing children for 45 minutes 1 time per week. Artavia voices the last few weeks have been less stressful and she is only working 25 hours per week. She has been not necessarily recording calories but has been mindful of staying <1600 calories.  Her weight is (!) 329 lb (149.2 kg) today and has gained 2 lbs since her last visit. She has lost 22 lbs since starting treatment with us.  Vitamin D Deficiency Amber Bentley has a diagnosis of vitamin D deficiency. She is currently taking prescription Vit D. She notes fatigue and denies nausea, vomiting or muscle weakness.  At risk for osteopenia and osteoporosis Amber Bentley is at higher risk of osteopenia and osteoporosis due to vitamin D deficiency.   Depression with Anxiety Amber Bentley notes her symptoms are better controlled on Buspar. She was previously on Trintellix and Zoloft. She shows no sign of suicidal or homicidal ideations.  ASSESSMENT AND PLAN:  Vitamin D deficiency - Plan: Vitamin D, Ergocalciferol, (DRISDOL) 1.25 MG (50000 UT) CAPS capsule  Depression with anxiety - Plan: busPIRone (BUSPAR) 7.5 MG tablet  At risk for osteoporosis  Class 3 severe obesity with serious comorbidity and body mass index (BMI) of 50.0 to 59.9 in adult, unspecified obesity type (HCC)  PLAN:  Vitamin D Deficiency Amber Bentley was informed that low vitamin D levels contributes to fatigue and are associated with obesity, breast, and colon cancer. Amber Bentley agrees to continue taking prescription Vit D 50,000 IU every week #4 and we will refill for 1 month. She will follow up for routine testing of vitamin D, at least 2-3 times per year. She was  informed of the risk of over-replacement of vitamin D and agrees to not increase her dose unless she discusses this with us first. Amber Bentley agrees to follow up with our clinic in 2 weeks.  At risk for osteopenia and osteoporosis Amber Bentley was given extended (15 minutes) osteoporosis prevention counseling today. Amber Bentley is at risk for osteopenia and osteoporsis due to her vitamin D deficiency. She was encouraged to take her vitamin D and follow her higher calcium diet and increase strengthening exercise to help strengthen her bones and decrease her risk of osteopenia and osteoporosis.  Depression with Anxiety We discussed behavior modification techniques today to help Amber Bentley deal with her emotional eating and depression. Amber Bentley agrees to continue taking Buspar 7.5 mg PO BID #60 and we will refill for 1 month.  Obesity Amber Bentley is currently in the action stage of change. As such, her goal is to continue with weight loss efforts She has agreed to keep a food journal with 1450-1600 calories and 100+ grams of protein daily or follow the Category 3 plan 3 times a day Amber Bentley has been instructed to work up to a goal of 150 minutes of combined cardio and strengthening exercise per week for weight loss and overall health benefits. We discussed the following Behavioral Modification Strategies today: increasing lean protein intake, increasing vegetables and work on meal planning and easy cooking plans, keeping healthy foods in the home, and planning for success   Amber Bentley has agreed to follow up with our clinic in 2 weeks. She was informed  of the importance of frequent follow up visits to maximize her success with intensive lifestyle modifications for her multiple health conditions.  ALLERGIES: Allergies  Allergen Reactions  . 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   [name] [Naproxen] Other (See Comments)    Pt states that this medication makes her loopy.     MEDICATIONS: Current  Outpatient Medications on File Prior to Visit  Medication Sig Dispense Refill  . losartan (COZAAR) 100 MG tablet Take 1 tablet (100 mg total) by mouth daily. 90 tablet 3  . metFORMIN (GLUCOPHAGE) 500 MG tablet TAKE 1 TABLET BY MOUTH ONCE DAILY WITH BREAKFAST 90 tablet 0  . pantoprazole (PROTONIX) 40 MG tablet Take 1 tablet (40 mg total) by mouth daily. 90 tablet 0  . rosuvastatin (CRESTOR) 10 MG tablet Take 1 tablet (10 mg total) by mouth daily. 90 tablet 3  . spironolactone (ALDACTONE) 25 MG tablet Take 1 tablet (25 mg total) by mouth daily. 90 tablet 3  . carvedilol (COREG) 12.5 MG tablet Take 1 tablet (12.5 mg total) by mouth 2 (two) times daily. 180 tablet 3  . hydrALAZINE (APRESOLINE) 25 MG tablet Take 1 tablet (25 mg total) by mouth 2 (two) times a day. 90 tablet 1   Current Facility-Administered Medications on File Prior to Visit  Medication Dose Route Frequency Provider Last Rate Last Dose  . cloNIDine (CATAPRES) tablet 0.1 mg  0.1 mg Oral Once Filbert Schilder, NP        PAST MEDICAL HISTORY: Past Medical History:  Diagnosis Date  . Anemia   . Anxiety   . Cardiomegaly   . Chest pain   . CHF (congestive heart failure) (HCC)   . Constipation   . Depression   . Dyspnea   . Fatigue   . Food allergy    celery  . GERD (gastroesophageal reflux disease)   . Hip pain   . HLD (hyperlipidemia)   . HTN (hypertension)   . Infertility, female   . Lactose intolerance   . Leg edema   . Lower back pain   . OSA on CPAP   . Palpitations   . Prediabetes   . Shoulder pain   . Stomach ulcer   . Varicose vein of leg     PAST SURGICAL HISTORY: Past Surgical History:  Procedure Laterality Date  . ABDOMINAL HYSTERECTOMY    . ENDOMETRIAL ABLATION  2007   prior to hysterectomy    SOCIAL HISTORY: Social History   Tobacco Use  . Smoking status: Former Smoker    Packs/day: 0.25    Years: 10.00    Pack years: 2.50    Types: Cigarettes  . Smokeless tobacco: Never Used   Substance Use Topics  . Alcohol use: Yes    Comment: rarely  . Drug use: No    FAMILY HISTORY: Family History  Problem Relation Age of Onset  . Hypertension Mother   . Cancer Mother        breast cancer  . Kidney disease Mother   . Depression Mother   . Obesity Mother   . Breast cancer Mother   . Diabetes Father   . Heart disease Father 74  . Hyperlipidemia Father   . Hypertension Father   . Depression Father   . Anxiety disorder Father   . Sleep apnea Father   . Obesity Father   . Thyroid disease Sister   . Sudden death Brother   .  Cancer Maternal Aunt        breast cancer  . Breast cancer Maternal Aunt        in 20's  . Cancer Paternal Grandfather        brain cancer  . Cancer Maternal Grandmother        breast  . Breast cancer Maternal Grandmother   . Breast cancer Paternal Grandmother     ROS: Review of Systems  Constitutional: Positive for malaise/fatigue. Negative for weight loss.  Gastrointestinal: Negative for nausea and vomiting.  Musculoskeletal:       Negative muscle weakness  Psychiatric/Behavioral: Positive for depression. Negative for suicidal ideas.    PHYSICAL EXAM: Blood pressure 130/82, pulse 74, temperature 98.5 F (36.9 C), temperature source Oral, height 5\' 7"  (1.702 m), weight (!) 329 lb (149.2 kg), SpO2 96 %. Body mass index is 51.53 kg/m. Physical Exam Vitals signs reviewed.  Constitutional:      Appearance: Normal appearance. She is obese.  Cardiovascular:     Rate and Rhythm: Normal rate.     Pulses: Normal pulses.  Pulmonary:     Effort: Pulmonary effort is normal.     Breath sounds: Normal breath sounds.  Musculoskeletal: Normal range of motion.  Skin:    General: Skin is warm and dry.  Neurological:     Mental Status: She is alert and oriented to person, place, and time.  Psychiatric:        Mood and Affect: Mood normal.        Behavior: Behavior normal.     RECENT LABS AND TESTS: BMET    Component Value  Date/Time   NA 142 04/14/2019 1126   K 4.3 04/14/2019 1126   CL 101 04/14/2019 1126   CO2 23 04/14/2019 1126   GLUCOSE 79 04/14/2019 1126   GLUCOSE 113 (H) 06/20/2017 0856   BUN 13 04/14/2019 1126   CREATININE 0.80 04/14/2019 1126   CALCIUM 9.1 04/14/2019 1126   GFRNONAA 93 04/14/2019 1126   GFRAA 107 04/14/2019 1126   Lab Results  Component Value Date   HGBA1C 5.5 04/14/2019   HGBA1C 5.4 07/07/2018   HGBA1C 6.0 (H) 12/30/2017   HGBA1C 5.8 02/21/2017   Lab Results  Component Value Date   INSULIN 28.4 (H) 04/14/2019   INSULIN 18.0 07/07/2018   INSULIN 89.7 (H) 12/30/2017   CBC    Component Value Date/Time   WBC 9.3 12/30/2017 1147   WBC 9.6 06/20/2017 0856   RBC 5.18 12/30/2017 1147   RBC 5.04 06/20/2017 0856   HGB 13.4 12/30/2017 1147   HCT 43.1 12/30/2017 1147   PLT 224.0 06/20/2017 0856   MCV 83 12/30/2017 1147   MCH 25.9 (L) 12/30/2017 1147   MCH 24.1 (L) 04/27/2017 2055   MCHC 31.1 (L) 12/30/2017 1147   MCHC 32.7 06/20/2017 0856   RDW 13.6 12/30/2017 1147   LYMPHSABS 2.0 12/30/2017 1147   MONOABS 0.6 02/21/2017 1510   EOSABS 0.2 12/30/2017 1147   BASOSABS 0.0 12/30/2017 1147   Iron/TIBC/Ferritin/ %Sat    Component Value Date/Time   IRON 85 06/20/2017 0856   TIBC 344 06/20/2017 0856   FERRITIN 55.4 06/20/2017 0856   IRONPCTSAT 25 06/20/2017 0856   Lipid Panel     Component Value Date/Time   CHOL 119 04/14/2019 1126   TRIG 60 04/14/2019 1126   HDL 47 04/14/2019 1126   CHOLHDL 3.3 04/08/2017 1124   CHOLHDL 4.7 02/22/2017 0420   VLDL 11 02/22/2017 0420   LDLCALC 60  04/14/2019 1126   Hepatic Function Panel     Component Value Date/Time   PROT 6.4 04/14/2019 1126   ALBUMIN 4.1 04/14/2019 1126   AST 15 04/14/2019 1126   ALT 17 04/14/2019 1126   ALKPHOS 67 04/14/2019 1126   BILITOT 0.3 04/14/2019 1126   BILIDIR 0.4 04/14/2017 0315   IBILI 0.7 04/14/2017 0315      Component Value Date/Time   TSH 1.270 12/30/2017 1147   TSH 3.01 06/20/2017  0856   TSH 4.78 (H) 02/21/2017 1510      OBESITY BEHAVIORAL INTERVENTION VISIT  Today's visit was # 34   Starting weight: 351 lbs Starting date: 12/30/17 Today's weight : 329 lbs Today's date: 09/08/2019 Total lbs lost to date: 22    ASK: We discussed the diagnosis of obesity with [name] today and Ambera agreed to give [name] permission to discuss obesity behavioral modification therapy today.  ASSESS: Twala has the diagnosis of obesity and her BMI today is 51.52 Deshanae is in the action stage of change   ADVISE: Marshawna was educated on the multiple health risks of obesity as well as the benefit of weight loss to improve her health. She was advised of the need for long term treatment and the importance of lifestyle modifications to improve her current health and to decrease her risk of future health problems.  AGREE: Multiple dietary modification options and treatment options were discussed and  Ikesha agreed to follow the recommendations documented in the above note.  ARRANGE: Collins was educated on the importance of frequent visits to treat obesity as outlined per CMS and USPSTF guidelines and agreed to schedule her next follow up appointment today.  I, Burt Knack, am acting as transcriptionist for Debbra Riding, MD  I have reviewed the above documentation for accuracy and completeness, and I agree with the above. - Debbra Riding, MD

## 2019-09-21 ENCOUNTER — Other Ambulatory Visit: Payer: Self-pay

## 2019-09-21 ENCOUNTER — Ambulatory Visit (INDEPENDENT_AMBULATORY_CARE_PROVIDER_SITE_OTHER): Payer: 59 | Admitting: Family Medicine

## 2019-09-21 VITALS — BP 155/79 | HR 73 | Temp 97.6°F | Ht 67.0 in | Wt 329.0 lb

## 2019-09-21 DIAGNOSIS — Z6841 Body Mass Index (BMI) 40.0 and over, adult: Secondary | ICD-10-CM

## 2019-09-21 DIAGNOSIS — I10 Essential (primary) hypertension: Secondary | ICD-10-CM | POA: Diagnosis not present

## 2019-09-21 DIAGNOSIS — R7303 Prediabetes: Secondary | ICD-10-CM | POA: Diagnosis not present

## 2019-09-22 NOTE — Progress Notes (Signed)
Office: [phone]  /  Fax: [phone]   HPI:   Chief Complaint: OBESITY Amber Bentley is here to discuss her progress with her obesity treatment plan. She is on the keep a food journal with 1450-1600 calories and 100+ grams of protein daily and follow the Category 3 plan and is following her eating plan approximately 98 % of the time. She states she is dancing for 60 minutes 7 times per week. Amber Bentley  Just had to put her dog down 5 days ago. Prior to this, she was following the plan most of the time. Journaling wise, she finds the evening to be the hardest. She is feeling hungry occasionally which leads to snacking.  Her weight is (!) 329 lb (149.2 kg) today and has not lost weight since her last visit. She has lost 22 lbs since starting treatment with [name].  Hypertension Amber Bentley is a 43 y.o. female with hypertension. Amber Bentley's blood pressure was slightly elevated today. She reports over caffeinating today. She denies chest pain, chest pressure, or headaches. She is working on weight loss to help control her blood pressure with the goal of decreasing her risk of heart attack and stroke.   Pre-Diabetes Amber Bentley has a diagnosis of pre-diabetes based on her elevated Hgb A1c and was informed this puts her at greater risk of developing diabetes. Last Hgb A1c was recently of 5.5. She denies GI side effects of metformin, and continues to work on diet and exercise to decrease risk of diabetes.   ASSESSMENT AND PLAN:  Essential hypertension  Prediabetes  Class 3 severe obesity with serious comorbidity and body mass index (BMI) of 50.0 to 59.9 in adult, unspecified obesity type ([place])  PLAN:  Hypertension We discussed sodium restriction, working on healthy weight loss, and a regular exercise program as the means to achieve improved blood pressure control. Amber Bentley agreed with this plan and agreed to follow up as directed. We will continue to monitor her blood pressure as well as her progress with  the above lifestyle modifications. Amber Bentley agrees to continue her medications and will watch for signs of hypotension as she continues her lifestyle modifications. We will follow up on her blood pressure at her next appointment. Amber Bentley agrees to follow up with our clinic in 2 weeks.  Pre-Diabetes Amber Bentley will continue to work on weight loss, exercise, and decreasing simple carbohydrates in her diet to help decrease the risk of diabetes. We dicussed metformin including benefits and risks. She was informed that eating too many simple carbohydrates or too many calories at one sitting increases the likelihood of GI side effects. Amber Bentley agrees to continue taking metformin, no refill needed. Amber Bentley agrees to follow up with our clinic in 2 weeks as directed to monitor her progress.  I spent > than 50% of the 15 minute visit on counseling as documented in the note.  Obesity Amber Bentley is currently in the action stage of change. As such, her goal is to continue with weight loss efforts She has agreed to keep a food journal with 1450-1600 calories and 100+ grams of protein daily and follow the Category 3 plan for 3 days Amber Bentley has been instructed to work up to a goal of 150 minutes of combined cardio and strengthening exercise per week for weight loss and overall health benefits. We discussed the following Behavioral Modification Strategies today: increasing lean protein intake, increasing vegetables and work on meal planning and easy cooking plans, keeping healthy foods in the home, and planning for success  Amber Bentley has agreed to follow up with our clinic in 2 weeks. She was informed of the importance of frequent follow up visits to maximize her success with intensive lifestyle modifications for her multiple health conditions.  ALLERGIES: Allergies  Allergen Reactions   Food Anaphylaxis and Other (See Comments)    Pt is allergic to celery.    Aleve [Naproxen] Other (See Comments)    Pt states that this medication  makes her loopy.     MEDICATIONS: Current Outpatient Medications on File Prior to Visit  Medication Sig Dispense Refill   busPIRone (BUSPAR) 7.5 MG tablet Take 1 tablet (7.5 mg total) by mouth 2 (two) times daily. 60 tablet 0   losartan (COZAAR) 100 MG tablet Take 1 tablet (100 mg total) by mouth daily. 90 tablet 3   metFORMIN (GLUCOPHAGE) 500 MG tablet TAKE 1 TABLET BY MOUTH ONCE DAILY WITH BREAKFAST 90 tablet 0   pantoprazole (PROTONIX) 40 MG tablet Take 1 tablet (40 mg total) by mouth daily. 90 tablet 0   rosuvastatin (CRESTOR) 10 MG tablet Take 1 tablet (10 mg total) by mouth daily. 90 tablet 3   spironolactone (ALDACTONE) 25 MG tablet Take 1 tablet (25 mg total) by mouth daily. 90 tablet 3   Vitamin D, Ergocalciferol, (DRISDOL) 1.25 MG (50000 UT) CAPS capsule Take 1 capsule (50,000 Units total) by mouth every 7 (seven) days. 4 capsule 0   carvedilol (COREG) 12.5 MG tablet Take 1 tablet (12.5 mg total) by mouth 2 (two) times daily. 180 tablet 3   hydrALAZINE (APRESOLINE) 25 MG tablet Take 1 tablet (25 mg total) by mouth 2 (two) times a day. 90 tablet 1   Current Facility-Administered Medications on File Prior to Visit  Medication Dose Route Frequency Provider Last Rate Last Dose   cloNIDine (CATAPRES) tablet 0.1 mg  0.1 mg Oral Once Filbert Schilder, NP        PAST MEDICAL HISTORY: Past Medical History:  Diagnosis Date   Anemia    Anxiety    Cardiomegaly    Chest pain    CHF (congestive heart failure) (HCC)    Constipation    Depression    Dyspnea    Fatigue    Food allergy    celery   GERD (gastroesophageal reflux disease)    Hip pain    HLD (hyperlipidemia)    HTN (hypertension)    Infertility, female    Lactose intolerance    Leg edema    Lower back pain    OSA on CPAP    Palpitations    Prediabetes    Shoulder pain    Stomach ulcer    Varicose vein of leg     PAST SURGICAL HISTORY: Past Surgical History:  Procedure  Laterality Date   ABDOMINAL HYSTERECTOMY     ENDOMETRIAL ABLATION  2007   prior to hysterectomy    SOCIAL HISTORY: Social History   Tobacco Use   Smoking status: Former Smoker    Packs/day: 0.25    Years: 10.00    Pack years: 2.50    Types: Cigarettes   Smokeless tobacco: Never Used  Substance Use Topics   Alcohol use: Yes    Comment: rarely   Drug use: No    FAMILY HISTORY: Family History  Problem Relation Age of Onset   Hypertension Mother    Cancer Mother        breast cancer   Kidney disease Mother    Depression Mother    Obesity Mother  Breast cancer Mother    Diabetes Father    Heart disease Father 735   Hyperlipidemia Father    Hypertension Father    Depression Father    Anxiety disorder Father    Sleep apnea Father    Obesity Father    Thyroid disease Sister    Sudden death Brother    Cancer Maternal Aunt        breast cancer   Breast cancer Maternal Aunt        in 4250's   Cancer Paternal Grandfather        brain cancer   Cancer Maternal Grandmother        breast   Breast cancer Maternal Grandmother    Breast cancer Paternal Grandmother     ROS: Review of Systems  Constitutional: Negative for weight loss.  Cardiovascular: Negative for chest pain.       Negative chest pressure  Neurological: Negative for headaches.    PHYSICAL EXAM: Blood pressure (!) 155/79, pulse 73, temperature 97.6 F (36.4 C), temperature source Oral, height 5\' 7"  (1.702 m), weight (!) 329 lb (149.2 kg), SpO2 98 %. Body mass index is 51.53 kg/m. Physical Exam Vitals signs reviewed.  Constitutional:      Appearance: Normal appearance. She is obese.  Cardiovascular:     Rate and Rhythm: Normal rate.     Pulses: Normal pulses.  Pulmonary:     Effort: Pulmonary effort is normal.     Breath sounds: Normal breath sounds.  Musculoskeletal: Normal range of motion.  Skin:    General: Skin is warm and dry.  Neurological:     Mental Status:  She is alert and oriented to person, place, and time.  Psychiatric:        Mood and Affect: Mood normal.        Behavior: Behavior normal.     RECENT LABS AND TESTS: BMET    Component Value Date/Time   NA 142 04/14/2019 1126   K 4.3 04/14/2019 1126   CL 101 04/14/2019 1126   CO2 23 04/14/2019 1126   GLUCOSE 79 04/14/2019 1126   GLUCOSE 113 (H) 06/20/2017 0856   BUN 13 04/14/2019 1126   CREATININE 0.80 04/14/2019 1126   CALCIUM 9.1 04/14/2019 1126   GFRNONAA 93 04/14/2019 1126   GFRAA 107 04/14/2019 1126   Lab Results  Component Value Date   HGBA1C 5.5 04/14/2019   HGBA1C 5.4 07/07/2018   HGBA1C 6.0 (H) 12/30/2017   HGBA1C 5.8 02/21/2017   Lab Results  Component Value Date   INSULIN 28.4 (H) 04/14/2019   INSULIN 18.0 07/07/2018   INSULIN 89.7 (H) 12/30/2017   CBC    Component Value Date/Time   WBC 9.3 12/30/2017 1147   WBC 9.6 06/20/2017 0856   RBC 5.18 12/30/2017 1147   RBC 5.04 06/20/2017 0856   HGB 13.4 12/30/2017 1147   HCT 43.1 12/30/2017 1147   PLT 224.0 06/20/2017 0856   MCV 83 12/30/2017 1147   MCH 25.9 (L) 12/30/2017 1147   MCH 24.1 (L) 04/27/2017 2055   MCHC 31.1 (L) 12/30/2017 1147   MCHC 32.7 06/20/2017 0856   RDW 13.6 12/30/2017 1147   LYMPHSABS 2.0 12/30/2017 1147   MONOABS 0.6 02/21/2017 1510   EOSABS 0.2 12/30/2017 1147   BASOSABS 0.0 12/30/2017 1147   Iron/TIBC/Ferritin/ %Sat    Component Value Date/Time   IRON 85 06/20/2017 0856   TIBC 344 06/20/2017 0856   FERRITIN 55.4 06/20/2017 0856   IRONPCTSAT 25 06/20/2017  6283   Lipid Panel     Component Value Date/Time   CHOL 119 04/14/2019 1126   TRIG 60 04/14/2019 1126   HDL 47 04/14/2019 1126   CHOLHDL 3.3 04/08/2017 1124   CHOLHDL 4.7 02/22/2017 0420   VLDL 11 02/22/2017 0420   LDLCALC 60 04/14/2019 1126   Hepatic Function Panel     Component Value Date/Time   PROT 6.4 04/14/2019 1126   ALBUMIN 4.1 04/14/2019 1126   AST 15 04/14/2019 1126   ALT 17 04/14/2019 1126    ALKPHOS 67 04/14/2019 1126   BILITOT 0.3 04/14/2019 1126   BILIDIR 0.4 04/14/2017 0315   IBILI 0.7 04/14/2017 0315      Component Value Date/Time   TSH 1.270 12/30/2017 1147   TSH 3.01 06/20/2017 0856   TSH 4.78 (H) 02/21/2017 1510      OBESITY BEHAVIORAL INTERVENTION VISIT  Today's visit was # 35   Starting weight: 351 lbs Starting date: 12/30/17 Today's weight : 329 lbs  Today's date: 09/21/2019 Total lbs lost to date: 22    ASK: We discussed the diagnosis of obesity with [name] today and Rahini agreed to give [name] permission to discuss obesity behavioral modification therapy today.  ASSESS: Hayleigh has the diagnosis of obesity and her BMI today is 51.52 Larson is in the action stage of change   ADVISE: Terrelle was educated on the multiple health risks of obesity as well as the benefit of weight loss to improve her health. She was advised of the need for long term treatment and the importance of lifestyle modifications to improve her current health and to decrease her risk of future health problems.  AGREE: Multiple dietary modification options and treatment options were discussed and  Reniyah agreed to follow the recommendations documented in the above note.  ARRANGE: Ayrabella was educated on the importance of frequent visits to treat obesity as outlined per CMS and USPSTF guidelines and agreed to schedule her next follow up appointment today.  I, Burt Knack, am acting as transcriptionist for Debbra Riding, MD  I have reviewed the above documentation for accuracy and completeness, and I agree with the above. - Debbra Riding, MD

## 2019-09-23 ENCOUNTER — Telehealth: Payer: Self-pay | Admitting: Nurse Practitioner

## 2019-09-23 NOTE — Telephone Encounter (Signed)

## 2019-09-24 ENCOUNTER — Encounter: Payer: Self-pay | Admitting: Nurse Practitioner

## 2019-09-24 ENCOUNTER — Ambulatory Visit (INDEPENDENT_AMBULATORY_CARE_PROVIDER_SITE_OTHER): Payer: 59

## 2019-09-24 DIAGNOSIS — Z23 Encounter for immunization: Secondary | ICD-10-CM

## 2019-09-24 NOTE — Progress Notes (Signed)
Pt came in today for a flu shot in left arm. Pt tolerated injection well and was given info.

## 2019-09-25 MED FILL — hydrALAZINE HCL 25 MG TABS: 25 | 45 days supply | Qty: 90 | Fill #1

## 2019-09-29 DIAGNOSIS — I159 Secondary hypertension, unspecified: Secondary | ICD-10-CM | POA: Diagnosis not present

## 2019-09-29 DIAGNOSIS — I5041 Acute combined systolic (congestive) and diastolic (congestive) heart failure: Secondary | ICD-10-CM | POA: Diagnosis not present

## 2019-09-29 DIAGNOSIS — I5032 Chronic diastolic (congestive) heart failure: Secondary | ICD-10-CM | POA: Diagnosis not present

## 2019-10-06 ENCOUNTER — Ambulatory Visit (INDEPENDENT_AMBULATORY_CARE_PROVIDER_SITE_OTHER): Payer: 59 | Admitting: Physician Assistant

## 2019-10-06 ENCOUNTER — Other Ambulatory Visit: Payer: Self-pay

## 2019-10-06 VITALS — BP 133/82 | HR 65 | Temp 98.4°F | Ht 67.0 in | Wt 325.0 lb

## 2019-10-06 DIAGNOSIS — E7849 Other hyperlipidemia: Secondary | ICD-10-CM

## 2019-10-06 DIAGNOSIS — E559 Vitamin D deficiency, unspecified: Secondary | ICD-10-CM | POA: Diagnosis not present

## 2019-10-06 DIAGNOSIS — Z6841 Body Mass Index (BMI) 40.0 and over, adult: Secondary | ICD-10-CM | POA: Diagnosis not present

## 2019-10-06 DIAGNOSIS — Z9189 Other specified personal risk factors, not elsewhere classified: Secondary | ICD-10-CM

## 2019-10-06 MED ORDER — VITAMIN D (ERGOCALCIFEROL) 1.25 MG (50000 UNIT) PO CAPS: 50000.0000 [IU] | ORAL_CAPSULE | ORAL | 0 refills | Status: DC

## 2019-10-06 MED FILL — VIT D2 1.25 MG (50,000 UNIT: 1.25 MG | 28 days supply | Qty: 4 | Fill #0

## 2019-10-07 NOTE — Progress Notes (Signed)
Office: [phone]  /  Fax: [phone]   HPI:   Chief Complaint: OBESITY Amber Bentley is here to discuss her progress with her obesity treatment plan. She is on the Category 3 plan and is following her eating plan approximately 20-30% of the time. She states she is walking 30 minutes 7 times per week. Amber Bentley reports that she was off the plan recently due to not having the money to buy food on the plan. She is ready to get back on track. She is an emotional eater and has been craving sweets.  Her weight is (!) 325 lb (147.4 kg) today and has had a weight loss of 4 pounds over a period of 2 weeks since her last visit. She has lost 26 lbs since starting treatment with [name].  Vitamin D deficiency Amber Bentley has a diagnosis of Vitamin D deficiency. She is currently taking prescription Vit D and denies nausea, vomiting or muscle weakness.  Hyperlipidemia Amber Bentley has hyperlipidemia and has been trying to improve her cholesterol levels with intensive lifestyle modification including a low saturated fat diet, exercise and weight loss. She is on a statin and denies any chest pain.  At risk for cardiovascular disease Amber Bentley is at a higher than average risk for cardiovascular disease due to obesity. She currently denies any chest pain.  ASSESSMENT AND PLAN:  Vitamin D deficiency - Plan: Vitamin D, Ergocalciferol, (DRISDOL) 1.25 MG (50000 UT) CAPS capsule  Other hyperlipidemia  At risk for heart disease  Class 3 severe obesity with serious comorbidity and body mass index (BMI) of 50.0 to 59.9 in adult, unspecified obesity type (HCC)  PLAN:  Vitamin D Deficiency Amber Bentley was informed that low Vitamin D levels contributes to fatigue and are associated with obesity, breast, and colon cancer. She agrees to continue to take prescription Vit D @ 50,000 IU every week #4 with 0 refills and will follow-up for routine testing of Vitamin D, at least 2-3 times per year. She was informed of the risk of over-replacement of  Vitamin D and agrees to not increase her dose unless she discusses this with [name] first. Amber Bentley agrees to follow-up with our clinic in 2 weeks.  Hyperlipidemia Amber Bentley was informed of the American Heart Association Guidelines emphasizing intensive lifestyle modifications as the first line treatment for hyperlipidemia. We discussed many lifestyle modifications today in depth, and Amber Bentley will continue to work on decreasing saturated fats such as fatty red meat, butter and many fried foods. Amber Bentley will continue her medication. She will also increase vegetables and lean protein in her diet and continue to work on exercise and weight loss efforts.  Cardiovascular risk counseling Amber Bentley was given extended (15 minutes) coronary artery disease prevention counseling today. She is 43 y.o. female and has risk factors for heart disease including obesity. We discussed intensive lifestyle modifications today with an emphasis on specific weight loss instructions and strategies. Pt was also informed of the importance of increasing exercise and decreasing saturated fats to help prevent heart disease.  Obesity Amber Bentley is currently in the action stage of change. As such, her goal is to continue with weight loss efforts. She has agreed to keep a food journal with 1500 calories and 100 grams of protein daily. Amber Bentley will have IC performed at her next visit and will have labs checked. Amber Bentley has been instructed to work up to a goal of 150 minutes of combined cardio and strengthening exercise per week for weight loss and overall health benefits. We discussed the following Behavioral Modification  Strategies today: decreasing simple carbohydrates and better snacking choices.  Amber Bentley has agreed to follow-up with our clinic in 2 weeks. She was informed of the importance of frequent follow-up visits to maximize her success with intensive lifestyle modifications for her multiple health conditions.  ALLERGIES: Allergies  Allergen  Reactions   Food Anaphylaxis and Other (See Comments)    Pt is allergic to celery.    Aleve [Naproxen] Other (See Comments)    Pt states that this medication makes her loopy.     MEDICATIONS: Current Outpatient Medications on File Prior to Visit  Medication Sig Dispense Refill   busPIRone (BUSPAR) 7.5 MG tablet Take 1 tablet (7.5 mg total) by mouth 2 (two) times daily. 60 tablet 0   carvedilol (COREG) 12.5 MG tablet Take 1 tablet (12.5 mg total) by mouth 2 (two) times daily. 180 tablet 3   hydrALAZINE (APRESOLINE) 25 MG tablet Take 1 tablet (25 mg total) by mouth 2 (two) times a day. 90 tablet 1   losartan (COZAAR) 100 MG tablet Take 1 tablet (100 mg total) by mouth daily. 90 tablet 3   metFORMIN (GLUCOPHAGE) 500 MG tablet TAKE 1 TABLET BY MOUTH ONCE DAILY WITH BREAKFAST 90 tablet 0   pantoprazole (PROTONIX) 40 MG tablet Take 1 tablet (40 mg total) by mouth daily. 90 tablet 0   rosuvastatin (CRESTOR) 10 MG tablet Take 1 tablet (10 mg total) by mouth daily. 90 tablet 3   spironolactone (ALDACTONE) 25 MG tablet Take 1 tablet (25 mg total) by mouth daily. 90 tablet 3   Current Facility-Administered Medications on File Prior to Visit  Medication Dose Route Frequency Provider Last Rate Last Dose   cloNIDine (CATAPRES) tablet 0.1 mg  0.1 mg Oral Once Filbert SchilderMcDaniel, Jill D, NP        PAST MEDICAL HISTORY: Past Medical History:  Diagnosis Date   Anemia    Anxiety    Cardiomegaly    Chest pain    CHF (congestive heart failure) (HCC)    Constipation    Depression    Dyspnea    Fatigue    Food allergy    celery   GERD (gastroesophageal reflux disease)    Hip pain    HLD (hyperlipidemia)    HTN (hypertension)    Infertility, female    Lactose intolerance    Leg edema    Lower back pain    OSA on CPAP    Palpitations    Prediabetes    Shoulder pain    Stomach ulcer    Varicose vein of leg     PAST SURGICAL HISTORY: Past Surgical History:    Procedure Laterality Date   ABDOMINAL HYSTERECTOMY     ENDOMETRIAL ABLATION  2007   prior to hysterectomy    SOCIAL HISTORY: Social History   Tobacco Use   Smoking status: Former Smoker    Packs/day: 0.25    Years: 10.00    Pack years: 2.50    Types: Cigarettes   Smokeless tobacco: Never Used  Substance Use Topics   Alcohol use: Yes    Comment: rarely   Drug use: No    FAMILY HISTORY: Family History  Problem Relation Age of Onset   Hypertension Mother    Cancer Mother        breast cancer   Kidney disease Mother    Depression Mother    Obesity Mother    Breast cancer Mother    Diabetes Father    Heart disease Father [id number]  Hyperlipidemia Father    Hypertension Father    Depression Father    Anxiety disorder Father    Sleep apnea Father    Obesity Father    Thyroid disease Sister    Sudden death Brother    Cancer Maternal Aunt        breast cancer   Breast cancer Maternal Aunt        in 68's   Cancer Paternal Grandfather        brain cancer   Cancer Maternal Grandmother        breast   Breast cancer Maternal Grandmother    Breast cancer Paternal Grandmother    ROS: Review of Systems  Cardiovascular: Negative for chest pain.  Gastrointestinal: Negative for nausea and vomiting.  Musculoskeletal:       Negative for muscle weakness.   PHYSICAL EXAM: Blood pressure 133/82, pulse 65, temperature 98.4 F (36.9 C), temperature source Oral, height 5\' 7"  (1.702 m), weight (!) 325 lb (147.4 kg), SpO2 97 %. Body mass index is 50.9 kg/m. Physical Exam Vitals signs reviewed.  Constitutional:      Appearance: Normal appearance. She is obese.  Cardiovascular:     Rate and Rhythm: Normal rate.     Pulses: Normal pulses.  Pulmonary:     Effort: Pulmonary effort is normal.     Breath sounds: Normal breath sounds.  Musculoskeletal: Normal range of motion.  Skin:    General: Skin is warm and dry.  Neurological:     Mental Status:  She is alert and oriented to person, place, and time.  Psychiatric:        Behavior: Behavior normal.   RECENT LABS AND TESTS: BMET    Component Value Date/Time   NA 142 04/14/2019 1126   K 4.3 04/14/2019 1126   CL 101 04/14/2019 1126   CO2 23 04/14/2019 1126   GLUCOSE 79 04/14/2019 1126   GLUCOSE 113 (H) 06/20/2017 0856   BUN 13 04/14/2019 1126   CREATININE 0.80 04/14/2019 1126   CALCIUM 9.1 04/14/2019 1126   GFRNONAA 93 04/14/2019 1126   GFRAA 107 04/14/2019 1126   Lab Results  Component Value Date   HGBA1C 5.5 04/14/2019   HGBA1C 5.4 07/07/2018   HGBA1C 6.0 (H) 12/30/2017   HGBA1C 5.8 02/21/2017   Lab Results  Component Value Date   INSULIN 28.4 (H) 04/14/2019   INSULIN 18.0 07/07/2018   INSULIN 89.7 (H) 12/30/2017   CBC    Component Value Date/Time   WBC 9.3 12/30/2017 1147   WBC 9.6 06/20/2017 0856   RBC 5.18 12/30/2017 1147   RBC 5.04 06/20/2017 0856   HGB 13.4 12/30/2017 1147   HCT 43.1 12/30/2017 1147   PLT 224.0 06/20/2017 0856   MCV 83 12/30/2017 1147   MCH 25.9 (L) 12/30/2017 1147   MCH 24.1 (L) 04/27/2017 2055   MCHC 31.1 (L) 12/30/2017 1147   MCHC 32.7 06/20/2017 0856   RDW 13.6 12/30/2017 1147   LYMPHSABS 2.0 12/30/2017 1147   MONOABS 0.6 02/21/2017 1510   EOSABS 0.2 12/30/2017 1147   BASOSABS 0.0 12/30/2017 1147   Iron/TIBC/Ferritin/ %Sat    Component Value Date/Time   IRON 85 06/20/2017 0856   TIBC 344 06/20/2017 0856   FERRITIN 55.4 06/20/2017 0856   IRONPCTSAT 25 06/20/2017 0856   Lipid Panel     Component Value Date/Time   CHOL 119 04/14/2019 1126   TRIG 60 04/14/2019 1126   HDL 47 04/14/2019 1126   CHOLHDL 3.3 04/08/2017  1124   CHOLHDL 4.7 02/22/2017 0420   VLDL 11 02/22/2017 0420   LDLCALC 60 04/14/2019 1126   Hepatic Function Panel     Component Value Date/Time   PROT 6.4 04/14/2019 1126   ALBUMIN 4.1 04/14/2019 1126   AST 15 04/14/2019 1126   ALT 17 04/14/2019 1126   ALKPHOS 67 04/14/2019 1126   BILITOT 0.3  04/14/2019 1126   BILIDIR 0.4 04/14/2017 0315   IBILI 0.7 04/14/2017 0315      Component Value Date/Time   TSH 1.270 12/30/2017 1147   TSH 3.01 06/20/2017 0856   TSH 4.78 (H) 02/21/2017 1510   Results for VANNAH, NADAL (MRN [id number]) as of 10/07/2019 14:50  Ref. Range 04/14/2019 11:26  Vitamin D, 25-Hydroxy Latest Ref Range: 30.0 - 100.0 ng/mL 38.7   OBESITY BEHAVIORAL INTERVENTION VISIT  Today's visit was #36  Starting weight: 351 lbs Starting date: 12/30/2017 Today's weight: 325 lbs Today's date: 10/06/2019 Total lbs lost to date: 26    10/06/2019  Height 5\' 7"  (1.702 m)  Weight 325 lb (147.4 kg) (A)  BMI (Calculated) 50.89  BLOOD PRESSURE - SYSTOLIC 035  BLOOD PRESSURE - DIASTOLIC 82   Body Fat % 46.5 %  Total Body Water (lbs) 112.8 lbs   ASK: We discussed the diagnosis of obesity with Amber Bentley today and Amber Bentley agreed to give [name] permission to discuss obesity behavioral modification therapy today.  ASSESS: Tony has the diagnosis of obesity and her BMI today is 50.9. Felipa is in the action stage of change.   ADVISE: Bernece was educated on the multiple health risks of obesity as well as the benefit of weight loss to improve her health. She was advised of the need for long term treatment and the importance of lifestyle modifications to improve her current health and to decrease her risk of future health problems.  AGREE: Multiple dietary modification options and treatment options were discussed and  Chelby agreed to follow the recommendations documented in the above note.  ARRANGE: Aniesa was educated on the importance of frequent visits to treat obesity as outlined per CMS and USPSTF guidelines and agreed to schedule her next follow up appointment today.  Migdalia Dk, am acting as transcriptionist for Abby Potash, PA-C I, Abby Potash, PA-C have reviewed above note and agree with its content

## 2019-10-27 ENCOUNTER — Other Ambulatory Visit: Payer: Self-pay

## 2019-10-27 ENCOUNTER — Encounter: Payer: Self-pay | Admitting: Physician Assistant

## 2019-10-27 ENCOUNTER — Encounter (INDEPENDENT_AMBULATORY_CARE_PROVIDER_SITE_OTHER): Payer: Self-pay | Admitting: Physician Assistant

## 2019-10-27 ENCOUNTER — Ambulatory Visit (INDEPENDENT_AMBULATORY_CARE_PROVIDER_SITE_OTHER): Payer: 59 | Admitting: Physician Assistant

## 2019-10-27 VITALS — BP 162/97 | HR 65 | Temp 97.9°F | Ht 67.0 in | Wt 328.0 lb

## 2019-10-27 DIAGNOSIS — E7849 Other hyperlipidemia: Secondary | ICD-10-CM | POA: Diagnosis not present

## 2019-10-27 DIAGNOSIS — F419 Anxiety disorder, unspecified: Secondary | ICD-10-CM | POA: Diagnosis not present

## 2019-10-27 DIAGNOSIS — E559 Vitamin D deficiency, unspecified: Secondary | ICD-10-CM

## 2019-10-27 DIAGNOSIS — R0602 Shortness of breath: Secondary | ICD-10-CM | POA: Diagnosis not present

## 2019-10-27 DIAGNOSIS — R7303 Prediabetes: Secondary | ICD-10-CM | POA: Diagnosis not present

## 2019-10-27 DIAGNOSIS — Z9189 Other specified personal risk factors, not elsewhere classified: Secondary | ICD-10-CM

## 2019-10-27 DIAGNOSIS — E66813 Obesity, class 3: Secondary | ICD-10-CM

## 2019-10-27 DIAGNOSIS — Z6841 Body Mass Index (BMI) 40.0 and over, adult: Secondary | ICD-10-CM | POA: Diagnosis not present

## 2019-10-27 MED ORDER — VITAMIN D (ERGOCALCIFEROL) 1.25 MG (50000 UNIT) PO CAPS: 50000.0000 [IU] | ORAL_CAPSULE | ORAL | 0 refills | Status: DC

## 2019-10-27 MED ORDER — BUSPIRONE HCL 7.5 MG PO TABS: 7.5000 mg | ORAL_TABLET | Freq: Two times a day (BID) | ORAL | 0 refills | Status: DC

## 2019-10-27 MED FILL — BUSPIRONE HCL 7.5 MG TABS: 7.5 | 30 days supply | Qty: 60 | Fill #0

## 2019-10-27 NOTE — Progress Notes (Signed)
Office: [phone]  /  Fax: [phone]   HPI:   Chief Complaint: OBESITY Amber Bentley is here to discuss her progress with her obesity treatment plan. She is on the Category 3 plan and is following her eating plan approximately 75 to 80 % of the time. She states she is walking 90 minutes 7 times per week. Amber Bentley enjoys the structure of the Category 3 plan. She had a birthday this weekend and she overindulged. She feels that she is a very emotional eater, and needs some help. Her weight is (!) 328 lb (148.8 kg) today and has had a weight gain of 3 pounds over a period of 3 weeks since her last visit. She has lost 23 lbs since starting treatment with us.  Hyperlipidemia Amber Bentley has hyperlipidemia and she is on Crestor. She has been trying to improve her cholesterol levels with intensive lifestyle modification including a low saturated fat diet, exercise and weight loss. She denies any chest pain.  Shortness of Breath [name] notes shortness of breath with exertion. She has no dizziness.  Vitamin D deficiency Amber Bentley has a diagnosis of vitamin D deficiency. Amber Bentley is on vit D and she denies nausea, vomiting or muscle weakness.  Pre-Diabetes Amber Bentley has a diagnosis of prediabetes based on her elevated Hgb A1c and was informed this puts her at greater risk of developing diabetes. She is on metformin currently. Amber Bentley denies nausea, vomiting or diarrhea. She continues to work on diet and exercise to decrease risk of diabetes.   Anxiety Amber Bentley reports a great deal of emotional eating, no matter the occasion, and it feels "out of control" at times.  At risk for cardiovascular disease Amber Bentley is at a higher than average risk for cardiovascular disease due to obesity, hyperlipidemia and prediabetes. She currently denies any chest pain.  ASSESSMENT AND PLAN:  Other hyperlipidemia - Plan: Lipid Panel With LDL/HDL Ratio  Shortness of breath on exertion  Vitamin D deficiency - Plan: Vitamin D (25 hydroxy),  Vitamin D, Ergocalciferol, (DRISDOL) 1.25 MG (50000 UT) CAPS capsule  Prediabetes - Plan: Comprehensive Metabolic Panel (CMET), HgB A1c, Insulin, random  Anxiety - Plan: busPIRone (BUSPAR) 7.5 MG tablet  At risk for heart disease  Class 3 severe obesity with serious comorbidity and body mass index (BMI) of 50.0 to 59.9 in adult, unspecified obesity type (HCC)  PLAN:  Hyperlipidemia Amber Bentley was informed of the American Heart Association Guidelines emphasizing intensive lifestyle modifications as the first line treatment for hyperlipidemia. We discussed many lifestyle modifications today in depth, and Amber Bentley will continue to work on decreasing saturated fats such as fatty red meat, butter and many fried foods. She will also increase vegetables and lean protein in her diet and continue to work on exercise and weight loss efforts. Amber Bentley will continue Crestor and follow up as directed.  Shortness of Breath Chasmine's shortness of breath appears to be obesity related and exercise induced. Indirect calorimetry was checked today and the indirect calorimeter results showed VO2 of 298 and a REE of 2077. She will continue to work on weight loss and gradually increase exercise.  Vitamin D Deficiency Amber Bentley was informed that low vitamin D levels contributes to fatigue and are associated with obesity, breast, and colon cancer. Amber Bentley agrees to continue to take prescription Vit D @50 ,000 IU every week #4 with no refills and she will follow up for routine testing of vitamin D, at least 2-3 times per year. She was informed of the risk of over-replacement of vitamin D and agrees  to not increase her dose unless she discusses this with [name] first. We will check labs and [name] agrees to follow up with our clinic in 2 to 3 weeks.  Pre-Diabetes [name] will continue to work on weight loss, exercise, and decreasing simple carbohydrates in her diet to help decrease the risk of diabetes. We dicussed metformin including benefits  and risks. She was informed that eating too many simple carbohydrates or too many calories at one sitting increases the likelihood of GI side effects. [name] will continue metformin and follow up with [name] as directed to monitor her progress.  Anxiety [name] agrees to continue Buspirone 7.5 mg two times daily #60 with no refills and follow up as directed. We will refer patient to Dr. Dewaine Conger, our bariatric psychologist.  Cardiovascular risk counseling Imane was given extended (15 minutes) coronary artery disease prevention counseling today. She is 43 y.o. female and has risk factors for heart disease including obesity, hyperlipidemia and prediabetes. We discussed intensive lifestyle modifications today with an emphasis on specific weight loss instructions and strategies. Pt was also informed of the importance of increasing exercise and decreasing saturated fats to help prevent heart disease.  Obesity [name] is currently in the action stage of change. As such, her goal is to continue with weight loss efforts She has agreed to follow the Category 3 plan [name] has been instructed to work up to a goal of 150 minutes of combined cardio and strengthening exercise per week for weight loss and overall health benefits. We discussed the following Behavioral Modification Strategies today: keeping healthy foods in the home and work on meal planning and easy cooking plans  [name] has agreed to follow up with our clinic in 3 weeks. She was informed of the importance of frequent follow up visits to maximize her success with intensive lifestyle modifications for her multiple health conditions.  ALLERGIES: Allergies  Allergen Reactions   Food Anaphylaxis and Other (See Comments)    Pt is allergic to celery.    Aleve [Naproxen] Other (See Comments)    Pt states that this medication makes her loopy.     MEDICATIONS: Current Outpatient Medications on File Prior to Visit  Medication Sig Dispense Refill    losartan (COZAAR) 100 MG tablet Take 1 tablet (100 mg total) by mouth daily. 90 tablet 3   metFORMIN (GLUCOPHAGE) 500 MG tablet TAKE 1 TABLET BY MOUTH ONCE DAILY WITH BREAKFAST 90 tablet 0   pantoprazole (PROTONIX) 40 MG tablet Take 1 tablet (40 mg total) by mouth daily. 90 tablet 0   rosuvastatin (CRESTOR) 10 MG tablet Take 1 tablet (10 mg total) by mouth daily. 90 tablet 3   spironolactone (ALDACTONE) 25 MG tablet Take 1 tablet (25 mg total) by mouth daily. 90 tablet 3   carvedilol (COREG) 12.5 MG tablet Take 1 tablet (12.5 mg total) by mouth 2 (two) times daily. 180 tablet 3   hydrALAZINE (APRESOLINE) 25 MG tablet Take 1 tablet (25 mg total) by mouth 2 (two) times a day. 90 tablet 1   Current Facility-Administered Medications on File Prior to Visit  Medication Dose Route Frequency Provider Last Rate Last Dose   cloNIDine (CATAPRES) tablet 0.1 mg  0.1 mg Oral Once Filbert Schilder, NP        PAST MEDICAL HISTORY: Past Medical History:  Diagnosis Date   Anemia    Anxiety    Cardiomegaly    Chest pain    CHF (congestive heart failure) (HCC)    Constipation  Depression    Dyspnea    Fatigue    Food allergy    celery   GERD (gastroesophageal reflux disease)    Hip pain    HLD (hyperlipidemia)    HTN (hypertension)    Infertility, female    Lactose intolerance    Leg edema    Lower back pain    OSA on CPAP    Palpitations    Prediabetes    Shoulder pain    Stomach ulcer    Varicose vein of leg     PAST SURGICAL HISTORY: Past Surgical History:  Procedure Laterality Date   ABDOMINAL HYSTERECTOMY     ENDOMETRIAL ABLATION  2007   prior to hysterectomy    SOCIAL HISTORY: Social History   Tobacco Use   Smoking status: Former Smoker    Packs/day: 0.25    Years: 10.00    Pack years: 2.50    Types: Cigarettes   Smokeless tobacco: Never Used  Substance Use Topics   Alcohol use: Yes    Comment: rarely   Drug use: No    FAMILY  HISTORY: Family History  Problem Relation Age of Onset   Hypertension Mother    Cancer Mother        breast cancer   Kidney disease Mother    Depression Mother    Obesity Mother    Breast cancer Mother    Diabetes Father    Heart disease Father 30   Hyperlipidemia Father    Hypertension Father    Depression Father    Anxiety disorder Father    Sleep apnea Father    Obesity Father    Thyroid disease Sister    Sudden death Brother    Cancer Maternal Aunt        breast cancer   Breast cancer Maternal Aunt        in 18's   Cancer Paternal Grandfather        brain cancer   Cancer Maternal Grandmother        breast   Breast cancer Maternal Grandmother    Breast cancer Paternal Grandmother     ROS: Review of Systems  Constitutional: Negative for weight loss.  Respiratory: Positive for shortness of breath (on exertion).   Cardiovascular: Negative for chest pain.  Gastrointestinal: Negative for diarrhea, nausea and vomiting.  Musculoskeletal:       Negative for muscle weakness  Neurological: Negative for dizziness.  Psychiatric/Behavioral: The patient is nervous/anxious.     PHYSICAL EXAM: Blood pressure (!) 162/97, pulse 65, temperature 97.9 F (36.6 C), temperature source Oral, height 5\' 7"  (1.702 m), weight (!) 328 lb (148.8 kg), SpO2 96 %. Body mass index is 51.37 kg/m. Physical Exam Vitals signs reviewed.  Constitutional:      Appearance: Normal appearance. She is well-developed. She is obese.  Cardiovascular:     Rate and Rhythm: Normal rate.  Pulmonary:     Effort: Pulmonary effort is normal.  Musculoskeletal: Normal range of motion.  Skin:    General: Skin is warm and dry.  Neurological:     Mental Status: She is alert and oriented to person, place, and time.  Psychiatric:        Mood and Affect: Mood normal.        Behavior: Behavior normal.     RECENT LABS AND TESTS: BMET    Component Value Date/Time   NA 142 04/14/2019  1126   K 4.3 04/14/2019 1126   CL 101 04/14/2019 1126  CO2 23 04/14/2019 1126   GLUCOSE 79 04/14/2019 1126   GLUCOSE 113 (H) 06/20/2017 0856   BUN 13 04/14/2019 1126   CREATININE 0.80 04/14/2019 1126   CALCIUM 9.1 04/14/2019 1126   GFRNONAA 93 04/14/2019 1126   GFRAA 107 04/14/2019 1126   Lab Results  Component Value Date   HGBA1C 5.5 04/14/2019   HGBA1C 5.4 07/07/2018   HGBA1C 6.0 (H) 12/30/2017   HGBA1C 5.8 02/21/2017   Lab Results  Component Value Date   INSULIN 28.4 (H) 04/14/2019   INSULIN 18.0 07/07/2018   INSULIN 89.7 (H) 12/30/2017   CBC    Component Value Date/Time   WBC 9.3 12/30/2017 1147   WBC 9.6 06/20/2017 0856   RBC 5.18 12/30/2017 1147   RBC 5.04 06/20/2017 0856   HGB 13.4 12/30/2017 1147   HCT 43.1 12/30/2017 1147   PLT 224.0 06/20/2017 0856   MCV 83 12/30/2017 1147   MCH 25.9 (L) 12/30/2017 1147   MCH 24.1 (L) 04/27/2017 2055   MCHC 31.1 (L) 12/30/2017 1147   MCHC 32.7 06/20/2017 0856   RDW 13.6 12/30/2017 1147   LYMPHSABS 2.0 12/30/2017 1147   MONOABS 0.6 02/21/2017 1510   EOSABS 0.2 12/30/2017 1147   BASOSABS 0.0 12/30/2017 1147   Iron/TIBC/Ferritin/ %Sat    Component Value Date/Time   IRON 85 06/20/2017 0856   TIBC 344 06/20/2017 0856   FERRITIN 55.4 06/20/2017 0856   IRONPCTSAT 25 06/20/2017 0856   Lipid Panel     Component Value Date/Time   CHOL 119 04/14/2019 1126   TRIG 60 04/14/2019 1126   HDL 47 04/14/2019 1126   CHOLHDL 3.3 04/08/2017 1124   CHOLHDL 4.7 02/22/2017 0420   VLDL 11 02/22/2017 0420   LDLCALC 60 04/14/2019 1126   Hepatic Function Panel     Component Value Date/Time   PROT 6.4 04/14/2019 1126   ALBUMIN 4.1 04/14/2019 1126   AST 15 04/14/2019 1126   ALT 17 04/14/2019 1126   ALKPHOS 67 04/14/2019 1126   BILITOT 0.3 04/14/2019 1126   BILIDIR 0.4 04/14/2017 0315   IBILI 0.7 04/14/2017 0315      Component Value Date/Time   TSH 1.270 12/30/2017 1147   TSH 3.01 06/20/2017 0856   TSH 4.78 (H) 02/21/2017  1510    Results for ARIEANA, SOMOZA (MRN [id number]) as of 10/27/2019 15:37  Ref. Range 04/14/2019 11:26  Vitamin D, 25-Hydroxy Latest Ref Range: 30.0 - 100.0 ng/mL 38.7    OBESITY BEHAVIORAL INTERVENTION VISIT  Today's visit was # 37   Starting weight: 351 lbs Starting date: 12/30/2017 Today's weight : 328 lbs Today's date: 10/27/2019 Total lbs lost to date: 23    10/27/2019  Height 5\' 7"  (1.702 m)  Weight 328 lb (148.8 kg) (A)  BMI (Calculated) 51.36  BLOOD PRESSURE - SYSTOLIC 616  BLOOD PRESSURE - DIASTOLIC 97   Body Fat % 07.3 %  RMR 2077    ASK: We discussed the diagnosis of obesity with [name] today and Kiara agreed to give [name] permission to discuss obesity behavioral modification therapy today.  ASSESS: Nickisha has the diagnosis of obesity and her BMI today is 51.36 Paxtyn is in the action stage of change   ADVISE: Nykira was educated on the multiple health risks of obesity as well as the benefit of weight loss to improve her health. She was advised of the need for long term treatment and the importance of lifestyle modifications to improve her current health and to decrease  her risk of future health problems.  AGREE: Multiple dietary modification options and treatment options were discussed and  Jurnei agreed to follow the recommendations documented in the above note.  ARRANGE: Amber Bentley was educated on the importance of frequent visits to treat obesity as outlined per CMS and USPSTF guidelines and agreed to schedule her next follow up appointment today.  Cristi LoronI, Joanne Murray, am acting as transcriptionist for Alois Clicheracey Aguilar, PA-C I, Alois Clicheracey Aguilar, PA-C have reviewed above note and agree with its content

## 2019-10-28 LAB — LIPID PANEL WITH LDL/HDL RATIO
Cholesterol, Total: 167 mg/dL (ref 100–199)
HDL: 51 mg/dL (ref >39)
LDL Chol Calc (NIH): 104 mg/dL — ABNORMAL HIGH (ref 0–99)
LDL/HDL Ratio: 2 ratio (ref 0.0–3.2)
Triglycerides: 64 mg/dL (ref 0–149)
VLDL Cholesterol Cal: 12 mg/dL (ref 5–40)

## 2019-10-28 LAB — COMPREHENSIVE METABOLIC PANEL
ALT: 16 IU/L (ref 0–32)
AST: 13 IU/L (ref 0–40)
Albumin/Globulin Ratio: 1.8 (ref 1.2–2.2)
Albumin: 4.2 g/dL (ref 3.8–4.8)
Alkaline Phosphatase: 69 IU/L (ref 39–117)
BUN/Creatinine Ratio: 13 (ref 9–23)
BUN: 11 mg/dL (ref 6–24)
Bilirubin Total: <0.2 mg/dL (ref 0.0–1.2)
CO2: 26 mmol/L (ref 20–29)
Calcium: 9 mg/dL (ref 8.7–10.2)
Chloride: 103 mmol/L (ref 96–106)
Creatinine, Ser: 0.85 mg/dL (ref 0.57–1.00)
GFR calc Af Amer: 98 mL/min/{1.73_m2} (ref >59)
GFR calc non Af Amer: 85 mL/min/{1.73_m2} (ref >59)
Globulin, Total: 2.4 g/dL (ref 1.5–4.5)
Glucose: 112 mg/dL — ABNORMAL HIGH (ref 65–99)
Potassium: 4.5 mmol/L (ref 3.5–5.2)
Sodium: 141 mmol/L (ref 134–144)
Total Protein: 6.6 g/dL (ref 6.0–8.5)

## 2019-10-28 LAB — VITAMIN D 25 HYDROXY (VIT D DEFICIENCY, FRACTURES): Vit D, 25-Hydroxy: 36.2 ng/mL (ref 30.0–100.0)

## 2019-10-28 LAB — HEMOGLOBIN A1C
Est. average glucose Bld gHb Est-mCnc: 111 mg/dL
Hgb A1c MFr Bld: 5.5 % (ref 4.8–5.6)

## 2019-10-28 LAB — INSULIN, RANDOM: INSULIN: 25.8 u[IU]/mL — ABNORMAL HIGH (ref 2.6–24.9)

## 2019-10-30 DIAGNOSIS — I159 Secondary hypertension, unspecified: Secondary | ICD-10-CM | POA: Diagnosis not present

## 2019-10-30 DIAGNOSIS — I5041 Acute combined systolic (congestive) and diastolic (congestive) heart failure: Secondary | ICD-10-CM | POA: Diagnosis not present

## 2019-10-30 DIAGNOSIS — I5032 Chronic diastolic (congestive) heart failure: Secondary | ICD-10-CM | POA: Diagnosis not present
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
## 2019-11-17 ENCOUNTER — Encounter (INDEPENDENT_AMBULATORY_CARE_PROVIDER_SITE_OTHER): Payer: Self-pay | Admitting: Physician Assistant

## 2019-11-17 ENCOUNTER — Ambulatory Visit (INDEPENDENT_AMBULATORY_CARE_PROVIDER_SITE_OTHER): Payer: 59 | Admitting: Psychology

## 2019-11-17 ENCOUNTER — Other Ambulatory Visit: Payer: Self-pay

## 2019-11-17 ENCOUNTER — Ambulatory Visit (INDEPENDENT_AMBULATORY_CARE_PROVIDER_SITE_OTHER): Payer: 59 | Admitting: Physician Assistant

## 2019-11-17 VITALS — BP 192/76 | HR 74 | Temp 99.2°F | Ht 67.0 in | Wt 331.0 lb

## 2019-11-17 DIAGNOSIS — F419 Anxiety disorder, unspecified: Secondary | ICD-10-CM | POA: Diagnosis not present

## 2019-11-17 DIAGNOSIS — Z9189 Other specified personal risk factors, not elsewhere classified: Secondary | ICD-10-CM

## 2019-11-17 DIAGNOSIS — K219 Gastro-esophageal reflux disease without esophagitis: Secondary | ICD-10-CM

## 2019-11-17 DIAGNOSIS — F3341 Major depressive disorder, recurrent, in partial remission: Secondary | ICD-10-CM | POA: Diagnosis not present

## 2019-11-17 DIAGNOSIS — E559 Vitamin D deficiency, unspecified: Secondary | ICD-10-CM | POA: Diagnosis not present

## 2019-11-17 DIAGNOSIS — Z6841 Body Mass Index (BMI) 40.0 and over, adult: Secondary | ICD-10-CM | POA: Diagnosis not present

## 2019-11-17 MED ORDER — VITAMIN D (ERGOCALCIFEROL) 1.25 MG (50000 UNIT) PO CAPS: 50000.0000 [IU] | ORAL_CAPSULE | ORAL | 0 refills | Status: DC

## 2019-11-17 MED ORDER — PANTOPRAZOLE SODIUM 40 MG PO TBEC: 40.0000 mg | DELAYED_RELEASE_TABLET | Freq: Every day | ORAL | 0 refills | Status: DC

## 2019-11-17 MED FILL — PANTOPRAZOLE SOD DR 40 MG T: 40 | 30 days supply | Qty: 30 | Fill #0

## 2019-11-17 MED FILL — VIT D2 1.25 MG (50,000 UNIT: 1.25 MG | 28 days supply | Qty: 4 | Fill #0

## 2019-11-17 NOTE — Addendum Note (Signed)
Addended by: Nash Dimmer on: 11/17/2019 05:08 PM   Modules accepted: Orders

## 2019-11-18 ENCOUNTER — Encounter (INDEPENDENT_AMBULATORY_CARE_PROVIDER_SITE_OTHER): Payer: Self-pay | Admitting: Physician Assistant

## 2019-11-18 NOTE — Progress Notes (Signed)
Office: [phone]  /  Fax: [phone]   HPI:   Chief Complaint: OBESITY Amber Bentley is here to discuss her progress with her obesity treatment plan. She is on the Category 3 plan and is keeping a food journal and is following her eating plan approximately 85 to 90 % of the time. She states she is walking with her puppy 20 to 30 minutes 7 times per week. Amber Bentley reports that she is not getting enough protein in during the day. She has not been journaling consistently. Amber Bentley states that she does a good job for 3 to 4 days, but has a hard time with dinner 2 to 3 days of the week.  Her weight is (!) 331 lb (150.1 kg) today and has had a weight gain of 3 pounds over a period of 3 weeks since her last visit. She has lost 20 lbs since starting treatment with [name].  Vitamin D Deficiency Amber Bentley has a diagnosis of vitamin D deficiency. She is currently on vit D. Burnette denies nausea, vomiting, or muscle weakness.  At risk for osteopenia and osteoporosis Amber Bentley is at higher risk of osteopenia and osteoporosis due to vitamin D deficiency.   Gastroesophageal Reflux Disease Amber Bentley is not having current symptoms of gastroesophageal reflux disease. She denies blood in her stools or black stools.  ASSESSMENT AND PLAN:  Vitamin D deficiency - Plan: Vitamin D, Ergocalciferol, (DRISDOL) 1.25 MG (50000 UT) CAPS capsule  Gastroesophageal reflux disease, unspecified whether esophagitis present - Plan: pantoprazole (PROTONIX) 40 MG tablet  At risk for osteoporosis  Class 3 severe obesity with serious comorbidity and body mass index (BMI) of 50.0 to 59.9 in adult, unspecified obesity type (HCC)  PLAN:  Vitamin D Deficiency Amber Bentley was informed that low vitamin D levels contribute to fatigue and are associated with obesity, breast, and colon cancer. Amber Bentley agrees to continue to take prescription Vit D @50 ,000 IU every week #4 with no refills and will follow up for routine testing of vitamin D, at least 2-3 times per  year. She was informed of the risk of over-replacement of vitamin D and agrees to not increase her dose unless she discusses this with first. Amber Bentley agrees to follow up in 2 weeks as directed.  At risk for osteopenia and osteoporosis Amber Bentley was given extended (15 minutes) osteoporosis prevention counseling today. Tynetta is at risk for osteopenia and osteoporosis due to her vitamin D deficiency. She was encouraged to take her vitamin D and follow her higher calcium diet and increase strengthening exercise to help strengthen her bones and decrease her risk of osteopenia and osteoporosis.  Gastroesophageal Reflux Disease Amber Bentley agrees to continue her Protonix 40 mg qd #90 with no refills and she will follow up at the agreed upon time.  Obesity Amber Bentley is currently in the action stage of change. As such, her goal is to continue with weight loss efforts.  She has agreed to follow the Category 3 plan. Amber Bentley has been instructed to work up to a goal of 150 minutes of combined cardio and strengthening exercise per week for weight loss and overall health benefits.  Amber Bentley has agreed to follow up with our clinic in 2 weeks. She was informed of the importance of frequent follow up visits to maximize her success with intensive lifestyle modifications for her multiple health conditions.  ALLERGIES: Allergies  Allergen Reactions  . 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   Amber Bentley [Naproxen] Other (See Comments)  Pt states that this medication makes her loopy.     MEDICATIONS: Current Outpatient Medications on File Prior to Visit  Medication Sig Dispense Refill  . busPIRone (BUSPAR) 7.5 MG tablet Take 1 tablet (7.5 mg total) by mouth 2 (two) times daily. 60 tablet 0  . carvedilol (COREG) 12.5 MG tablet Take 1 tablet (12.5 mg total) by mouth 2 (two) times daily. 180 tablet 3  . hydrALAZINE (APRESOLINE) 25 MG tablet Take 1 tablet (25 mg total) by mouth 2 (two) times a day.  90 tablet 1  . losartan (COZAAR) 100 MG tablet Take 1 tablet (100 mg total) by mouth daily. 90 tablet 3  . metFORMIN (GLUCOPHAGE) 500 MG tablet TAKE 1 TABLET BY MOUTH ONCE DAILY WITH BREAKFAST 90 tablet 0  . rosuvastatin (CRESTOR) 10 MG tablet Take 1 tablet (10 mg total) by mouth daily. 90 tablet 3  . spironolactone (ALDACTONE) 25 MG tablet Take 1 tablet (25 mg total) by mouth daily. 90 tablet 3   Current Facility-Administered Medications on File Prior to Visit  Medication Dose Route Frequency Provider Last Rate Last Dose  . cloNIDine (CATAPRES) tablet 0.1 mg  0.1 mg Oral Once Filbert Schilder, NP        PAST MEDICAL HISTORY: Past Medical History:  Diagnosis Date  . Anemia   . Anxiety   . Cardiomegaly   . Chest pain   . CHF (congestive heart failure) (HCC)   . Constipation   . Depression   . Dyspnea   . Fatigue   . Food allergy    celery  . GERD (gastroesophageal reflux disease)   . Hip pain   . HLD (hyperlipidemia)   . HTN (hypertension)   . Infertility, female   . Lactose intolerance   . Leg edema   . Lower back pain   . OSA on CPAP   . Palpitations   . Prediabetes   . Shoulder pain   . Stomach ulcer   . Varicose vein of leg     PAST SURGICAL HISTORY: Past Surgical History:  Procedure Laterality Date  . ABDOMINAL HYSTERECTOMY    . ENDOMETRIAL ABLATION  2007   prior to hysterectomy    SOCIAL HISTORY: Social History   Tobacco Use  . Smoking status: Former Smoker    Packs/day: 0.25    Years: 10.00    Pack years: 2.50    Types: Cigarettes  . Smokeless tobacco: Never Used  Substance Use Topics  . Alcohol use: Yes    Comment: rarely  . Drug use: No    FAMILY HISTORY: Family History  Problem Relation Age of Onset  . Hypertension Mother   . Cancer Mother        breast cancer  . Kidney disease Mother   . Depression Mother   . Obesity Mother   . Breast cancer Mother   . Diabetes Father   . Heart disease Father 39  . Hyperlipidemia Father   .  Hypertension Father   . Depression Father   . Anxiety disorder Father   . Sleep apnea Father   . Obesity Father   . Thyroid disease Sister   . Sudden death Brother   . Cancer Maternal Aunt        breast cancer  . Breast cancer Maternal Aunt        in 56's  . Cancer Paternal Grandfather        brain cancer  . Cancer Maternal Grandmother  breast  . Breast cancer Maternal Grandmother   . Breast cancer Paternal Grandmother     ROS: Review of Systems  Constitutional: Negative for weight loss.  Gastrointestinal: Negative for blood in stool, nausea and vomiting.  Musculoskeletal:       Negative for muscle weakness.    PHYSICAL EXAM: Blood pressure (!) 192/76, pulse 74, temperature 99.2 F (37.3 C), temperature source Oral, height 5\' 7"  (1.702 m), weight (!) 331 lb (150.1 kg), SpO2 97 %. Body mass index is 51.84 kg/m. Physical Exam Vitals signs reviewed.  Constitutional:      Appearance: Normal appearance. She is obese.  Cardiovascular:     Rate and Rhythm: Normal rate.  Pulmonary:     Effort: Pulmonary effort is normal.  Musculoskeletal: Normal range of motion.  Skin:    General: Skin is warm and dry.  Neurological:     Mental Status: She is alert and oriented to person, place, and time.  Psychiatric:        Mood and Affect: Mood normal.        Behavior: Behavior normal.     RECENT LABS AND TESTS: BMET    Component Value Date/Time   NA 141 10/27/2019 0800   K 4.5 10/27/2019 0800   CL 103 10/27/2019 0800   CO2 26 10/27/2019 0800   GLUCOSE 112 (H) 10/27/2019 0800   GLUCOSE 113 (H) 06/20/2017 0856   BUN 11 10/27/2019 0800   CREATININE 0.85 10/27/2019 0800   CALCIUM 9.0 10/27/2019 0800   GFRNONAA 85 10/27/2019 0800   GFRAA 98 10/27/2019 0800   Lab Results  Component Value Date   HGBA1C 5.5 10/27/2019   HGBA1C 5.5 04/14/2019   HGBA1C 5.4 07/07/2018   HGBA1C 6.0 (H) 12/30/2017   HGBA1C 5.8 02/21/2017   Lab Results  Component Value Date   INSULIN  25.8 (H) 10/27/2019   INSULIN 28.4 (H) 04/14/2019   INSULIN 18.0 07/07/2018   INSULIN 89.7 (H) 12/30/2017   CBC    Component Value Date/Time   WBC 9.3 12/30/2017 1147   WBC 9.6 06/20/2017 0856   RBC 5.18 12/30/2017 1147   RBC 5.04 06/20/2017 0856   HGB 13.4 12/30/2017 1147   HCT 43.1 12/30/2017 1147   PLT 224.0 06/20/2017 0856   MCV 83 12/30/2017 1147   MCH 25.9 (L) 12/30/2017 1147   MCH 24.1 (L) 04/27/2017 2055   MCHC 31.1 (L) 12/30/2017 1147   MCHC 32.7 06/20/2017 0856   RDW 13.6 12/30/2017 1147   LYMPHSABS 2.0 12/30/2017 1147   MONOABS 0.6 02/21/2017 1510   EOSABS 0.2 12/30/2017 1147   BASOSABS 0.0 12/30/2017 1147   Iron/TIBC/Ferritin/ %Sat    Component Value Date/Time   IRON 85 06/20/2017 0856   TIBC 344 06/20/2017 0856   FERRITIN 55.4 06/20/2017 0856   IRONPCTSAT 25 06/20/2017 0856   Lipid Panel     Component Value Date/Time   CHOL 167 10/27/2019 0800   TRIG 64 10/27/2019 0800   HDL 51 10/27/2019 0800   CHOLHDL 3.3 04/08/2017 1124   CHOLHDL 4.7 02/22/2017 0420   VLDL 11 02/22/2017 0420   LDLCALC 104 (H) 10/27/2019 0800   Hepatic Function Panel     Component Value Date/Time   PROT 6.6 10/27/2019 0800   ALBUMIN 4.2 10/27/2019 0800   AST 13 10/27/2019 0800   ALT 16 10/27/2019 0800   ALKPHOS 69 10/27/2019 0800   BILITOT <0.2 10/27/2019 0800   BILIDIR 0.4 04/14/2017 0315   IBILI 0.7 04/14/2017 0315  Component Value Date/Time   TSH 1.270 12/30/2017 1147   TSH 3.01 06/20/2017 0856   TSH 4.78 (H) 02/21/2017 1510   Results for Amber Bentley, Amber Bentley (MRN [id number]) as of 11/18/2019 07:02  Ref. Range 10/27/2019 08:00  Vitamin D, 25-Hydroxy Latest Ref Range: 30.0 - 100.0 ng/mL 36.2    OBESITY BEHAVIORAL INTERVENTION VISIT  Today's visit was # 38  Starting weight: 351 lbs Starting date: 12/30/2017 Today's weight : Weight: (!) 331 lb (150.1 kg)  Today's date: 11/17/2019 Total lbs lost to date: 20    11/17/2019  Height 5\' 7"  (1.702 m)  Weight 331  lb (150.1 kg) (A)  BMI (Calculated) 51.83  BLOOD PRESSURE - SYSTOLIC 192  BLOOD PRESSURE - DIASTOLIC 76   Body Fat % 58.6 %   ASK: We discussed the diagnosis of obesity with Amber Ponderobin Bentley Bentley today and Amber Bentley agreed to give us permission to discuss obesity behavioral modification therapy today.  ASSESS: Amber Bentley has the diagnosis of obesity and her BMI today is 51.83. Amber Bentley is in the action stage of change.   ADVISE: Amber Bentley was educated on the multiple health risks of obesity as well as the benefit of weight loss to improve her health. She was advised of the need for long term treatment and the importance of lifestyle modifications to improve her current health and to decrease her risk of future health problems.  AGREE: Multiple dietary modification options and treatment options were discussed and Jacqueline agreed to follow the recommendations documented in the above note.  ARRANGE: Amber Bentley was educated on the importance of frequent visits to treat obesity as outlined per CMS and USPSTF guidelines and agreed to schedule her next follow up appointment today.  Launa FlightI, Amadeo Coke, CMA, am acting as transcriptionist for Alois Clicheracey Aguilar, PA-C I, Alois Clicheracey Aguilar, PA-C have reviewed above note and agree with its content

## 2019-11-18 NOTE — Telephone Encounter (Signed)
Please review

## 2019-11-22 ENCOUNTER — Encounter (INDEPENDENT_AMBULATORY_CARE_PROVIDER_SITE_OTHER): Payer: Self-pay | Admitting: Family Medicine

## 2019-11-23 NOTE — Progress Notes (Signed)
Office: [phone]  /  Fax: [phone]    Date: December 01, 2019   Appointment Start Time: 3:00pm Duration: 36 minutes Provider: Glennie Isle, Psy.D. Type of Session: Individual Therapy  Location of Patient: Home Location of Provider: [facility] Type of Contact: Telepsychological Visit via [employer]   Informed Consent:  Prior to initiating telepsychological services, Tracye completed an informed consent document, which included the development of a safety plan (i.e., an emergency contact, nearest emergency room, and emergency resources) in the event of an emergency/crisis. Lanna expressed understanding of the rationale of the safety plan. Haydee verbally acknowledged understanding she is ultimately responsible for understanding her insurance benefits for telepsychological and in-person services. This provider also reviewed confidentiality, as it relates to telepsychological services, as well as the rationale for telepsychological services (i.e., to reduce exposure risk to COVID-19). Damiyah  acknowledged understanding that appointments cannot be recorded without both party consent and she is aware she is responsible for securing confidentiality on her end of the session. Amita verbally consented to proceed. Dariya and this provider participated in today's telepsychological service.   Session Content: Modell is a 43 y.o. female presenting via [place] for a follow-up appointment to address the previously established treatment goal of decreasing emotional eating. Today's appointment was a telepsychological visit due to COVID-19. Jasminne provided verbal consent for today's telepsychological appointment and she is aware she is responsible for securing confidentiality on her end of the session. Prior to proceeding with today's appointment, Shaelin's physical location at the time of this appointment was obtained as well a phone number she could be reached at in the event of  technical difficulties. Alize and this provider participated in today's telepsychological service.   This provider conducted a brief check-in and verbally administered the PHQ-9 and GAD-7. A risk assessment was completed. Icela denied experiencing suicidal and homicidal ideation, plan, and intent since the last appointment with this provider. She reported she continues to have access to the developed safety plan, and continues to acknowledge understanding regarding the importance of reaching out to trusted individuals and/or emergency resources if she is unable to ensure safety.   Averi shared she had a "long shot exposure for COVID" and noted her test was negative. She further noted, "Everything is pretty even keel right now." Regarding eating, Anedra reported she did not engage in "boredom eating," but believes she engaged in "boredom starve." This was further explored and psychoeducation regarding emotional versus physical hunger was provided. Belem was given a handout to utilize between now and the next appointment to increase awareness of hunger patterns and subsequent eating. Caleb provided verbal consent during today's appointment for this provider to send a handout about hunger patterns via e-mail. Moreover, psychoeducation regarding triggers for emotional eating was provided. Garyn was provided a handout, and encouraged to utilize the handout between now and the next appointment to increase awareness of triggers and frequency. Taleisha agreed. This provider also discussed behavioral strategies for specific triggers, such as placing the utensil down when conversing to avoid mindless eating. Leilanie provided verbal consent during today's appointment for this provider to send a handout about triggers via e-mail. Overall, Shrika was receptive to today's appointment as evidenced by openness to sharing, responsiveness to feedback, and willingness to explore triggers for emotional eating.  Mental Status  Examination:  Appearance: well groomed and appropriate hygiene  Behavior: appropriate to circumstances Mood: euthymic Affect: mood congruent Speech: normal in rate, volume, and tone Eye Contact: appropriate Psychomotor Activity:  appropriate Gait: unable to assess Thought Process: linear, logical, and goal directed  Thought Content/Perception: denies suicidal and homicidal ideation, plan, and intent and no hallucinations, delusions, bizarre thinking or behavior reported or observed Orientation: time, person, place and purpose of appointment Memory/Concentration: memory, attention, language, and fund of knowledge intact  Insight/Judgment: good  Structured Assessments Results: The Patient Health Questionnaire-9 (PHQ-9) is a self-report measure that assesses symptoms and severity of depression over the course of the last two weeks. Ysabella obtained a score of 2 suggesting minimal depression. Amyriah finds the endorsed symptoms to be not difficult at all. [0= Not at all; 1= Several days; 2= More than half the days; 3= Nearly every day] Little interest or pleasure in doing things 0  Feeling down, depressed, or hopeless 0  Trouble falling or staying asleep, or sleeping too much 0  Feeling tired or having little energy 1  Poor appetite or overeating 1  Feeling bad about yourself --- or that you are a failure or have let yourself or your family down 0  Trouble concentrating on things, such as reading the newspaper or watching television 0  Moving or speaking so slowly that other people could have noticed? Or the opposite --- being so fidgety or restless that you have been moving around a lot more than usual 0  Thoughts that you would be better off dead or hurting yourself in some way 0  PHQ-9 Score 2    The Generalized Anxiety Disorder-7 (GAD-7) is a brief self-report measure that assesses symptoms of anxiety over the course of the last two weeks. Pecolia obtained a score of 0. [0= Not at all; 1=  Several days; 2= Over half the days; 3= Nearly every day] Feeling nervous, anxious, on edge 0  Not being able to stop or control worrying 0  Worrying too much about different things 0  Trouble relaxing 0  Being so restless that it's hard to sit still 0  Becoming easily annoyed or irritable 0  Feeling afraid as if something awful might happen 0  GAD-7 Score 0   Interventions:  Conducted a brief chart review Verbally administered PHQ-9 and GAD-7 for symptom monitoring Conducted a risk assessment Provided empathic reflections and validation Employed supportive psychotherapy interventions to facilitate reduced distress and to improve coping skills with identified stressors Employed motivational interviewing skills to assess patient's willingness/desire to adhere to recommended medical treatments and assignments Psychoeducation provided regarding physical versus emotional hunger Psychoeducation provided regarding triggers for emotional eating  DSM-5 Diagnoses: 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 Progress is limited, as Neelie has just begun treatment with this provider; however, she is receptive to the interaction and interventions and rapport is being established.   Plan: Due to the upcoming holidays, office closures, and this provider being out of the office in the coming weeks, the next appointment will be scheduled in approximately three weeks, which will be via [employer]. The next session will focus on introduction of mindfulness. In addition, this provider requested Yazmin call [facility] as she was contacted twice per Epic. The phone number was provided and Jonquil agreed to call back after the appointment with this provider.

## 2019-11-25 ENCOUNTER — Other Ambulatory Visit: Payer: Self-pay

## 2019-11-25 DIAGNOSIS — Z20822 Contact with and (suspected) exposure to covid-19: Secondary | ICD-10-CM

## 2019-11-27 LAB — NOVEL CORONAVIRUS, NAA: SARS-CoV-2, NAA: NOT DETECTED

## 2019-11-29 DIAGNOSIS — I5032 Chronic diastolic (congestive) heart failure: Secondary | ICD-10-CM | POA: Diagnosis not present

## 2019-11-29 DIAGNOSIS — I159 Secondary hypertension, unspecified: Secondary | ICD-10-CM | POA: Diagnosis not present

## 2019-11-29 DIAGNOSIS — I5041 Acute combined systolic (congestive) and diastolic (congestive) heart failure: Secondary | ICD-10-CM | POA: Diagnosis not present

## 2019-12-01 ENCOUNTER — Other Ambulatory Visit: Payer: Self-pay

## 2019-12-01 ENCOUNTER — Telehealth (INDEPENDENT_AMBULATORY_CARE_PROVIDER_SITE_OTHER): Payer: 59 | Admitting: Family Medicine

## 2019-12-01 ENCOUNTER — Ambulatory Visit (INDEPENDENT_AMBULATORY_CARE_PROVIDER_SITE_OTHER): Payer: 59 | Admitting: Psychology

## 2019-12-01 ENCOUNTER — Encounter (INDEPENDENT_AMBULATORY_CARE_PROVIDER_SITE_OTHER): Payer: Self-pay | Admitting: Family Medicine

## 2019-12-01 DIAGNOSIS — K219 Gastro-esophageal reflux disease without esophagitis: Secondary | ICD-10-CM

## 2019-12-01 DIAGNOSIS — F419 Anxiety disorder, unspecified: Secondary | ICD-10-CM

## 2019-12-01 DIAGNOSIS — F3341 Major depressive disorder, recurrent, in partial remission: Secondary | ICD-10-CM | POA: Diagnosis not present

## 2019-12-01 DIAGNOSIS — E559 Vitamin D deficiency, unspecified: Secondary | ICD-10-CM

## 2019-12-01 DIAGNOSIS — R7303 Prediabetes: Secondary | ICD-10-CM | POA: Diagnosis not present

## 2019-12-01 MED ORDER — PANTOPRAZOLE SODIUM 40 MG PO TBEC: 40.0000 mg | DELAYED_RELEASE_TABLET | Freq: Every day | ORAL | 0 refills | Status: DC

## 2019-12-01 MED ORDER — VITAMIN D (ERGOCALCIFEROL) 1.25 MG (50000 UNIT) PO CAPS: 50000.0000 [IU] | ORAL_CAPSULE | ORAL | 0 refills | Status: DC

## 2019-12-01 MED ORDER — METFORMIN HCL 500 MG PO TABS: ORAL_TABLET | ORAL | 0 refills | Status: DC

## 2019-12-01 MED FILL — metFORMIN HCL 500 MG TABS: 500 | 30 days supply | Qty: 30 | Fill #0

## 2019-12-02 NOTE — Progress Notes (Signed)
Office: [phone]  /  Fax: [phone] TeleHealth Visit:  Amber Bentley has verbally consented to this TeleHealth visit today. The patient is located at home, the provider is located at the [employer] [facility] office. The participants in this visit include the listed provider and patient. The visit was conducted today via telephone call (AV failed - changed to telephone call).  HPI:  Chief Complaint: OBESITY Amber Bentley is here to discuss her progress with her obesity treatment plan. She is on the Category 3 plan and states she is following her eating plan approximately 70% of the time. She states she is walking the dog 60 minutes 7 times per week.  Amber Bentley feels she has done well maintaining her weight this month. She notes less temptations at work, but is doing well with PC when she indulges. Hunger is controlled.  Today's visit was  #40 Starting weight: 351 lbs Starting date: 12/30/2017   Prediabetes Amber Bentley has a diagnosis of prediabetes and is working on diet, exercise, and weight loss. No nausea or vomiting. Last A1c 5.5 on 10/27/2019.  Vitamin D deficiency Amber Bentley has Vitamin D deficiency and she is stable on prescription Vitamin D. Her Vitamin D level is not yet at goal (36.2 on 10/27/2019). No nausea, vomiting, or muscle weakness.  Gastroesophageal Reflux Disease (GERD) Amber Bentley has GERD and is stable on pantoprazole and diet. She requests a refill.  ASSESSMENT AND PLAN:  Prediabetes - Plan: metFORMIN (GLUCOPHAGE) 500 MG tablet  Vitamin D deficiency - Plan: Vitamin D, Ergocalciferol, (DRISDOL) 1.25 MG (50000 UT) CAPS capsule  Gastroesophageal reflux disease, unspecified whether esophagitis present - Plan: pantoprazole (PROTONIX) 40 MG tablet  PLAN:  Pre-Diabetes Amber Bentley will continue to work on weight loss, exercise, and decreasing simple carbohydrates to help decrease the risk of diabetes. She was given a refill on her metformin 500 mg #30 with 0 refills and agrees to  follow-up with our clinic in 3 weeks.  Vitamin D Deficiency Amber Bentley was informed that low Vitamin D levels contributes to fatigue and are associated with obesity, breast, and colon cancer. She agrees to continue to take prescription Vit D @ 50,000 IU every week #4 with 0 refills and will follow-up for routine testing of Vitamin D in 2 months. She was informed of the risk of over-replacement of Vitamin D and agrees to not increase her dose unless she discusses this with [name] first. Amber Bentley agrees to follow-up with our clinic in 3 weeks.  Gastroesophageal Reflux Disease (GERD) Amber Bentley was given a refill on her pantoprazole 40 mg #30 with 0 refills. She agrees to follow-up with our clinic in 3 weeks.  Obesity Amber Bentley is currently in the action stage of change. As such, her goal is to continue with weight loss efforts. She has agreed to follow the Category 3 plan. Amber Bentley will work up to a goal of 150 minutes of combined cardio and strengthening exercise per week for weight loss and overall health benefits. We discussed the following Behavioral Modification Strategies today: holiday eating strategies  and avoiding temptations.  Amber Bentley has agreed to follow-up with our clinic in 3 weeks. She was informed of the importance of frequent follow-up visits to maximize her success with intensive lifestyle modifications for her multiple health conditions.  ALLERGIES: Allergies  Allergen Reactions  . 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   Amber Bentley [Naproxen] Other (See Comments)    Pt states that this medication makes her loopy.  MEDICATIONS: Current Outpatient Medications on File Prior to Visit  Medication Sig Dispense Refill  . carvedilol (COREG) 12.5 MG tablet Take 1 tablet (12.5 mg total) by mouth 2 (two) times daily. 180 tablet 3  . hydrALAZINE (APRESOLINE) 25 MG tablet Take 1 tablet (25 mg total) by mouth 2 (two) times a day. 90 tablet 1  . losartan (COZAAR) 100 MG tablet  Take 1 tablet (100 mg total) by mouth daily. 90 tablet 3  . rosuvastatin (CRESTOR) 10 MG tablet Take 1 tablet (10 mg total) by mouth daily. 90 tablet 3  . spironolactone (ALDACTONE) 25 MG tablet Take 1 tablet (25 mg total) by mouth daily. 90 tablet 3   Current Facility-Administered Medications on File Prior to Visit  Medication Dose Route Frequency Provider Last Rate Last Admin  . cloNIDine (CATAPRES) tablet 0.1 mg  0.1 mg Oral Once Filbert Schilder, NP        PAST MEDICAL HISTORY: Past Medical History:  Diagnosis Date  . Anemia   . Anxiety   . Cardiomegaly   . Chest pain   . CHF (congestive heart failure) (HCC)   . Constipation   . Depression   . Dyspnea   . Fatigue   . Food allergy    celery  . GERD (gastroesophageal reflux disease)   . Hip pain   . HLD (hyperlipidemia)   . HTN (hypertension)   . Infertility, female   . Lactose intolerance   . Leg edema   . Lower back pain   . OSA on CPAP   . Palpitations   . Prediabetes   . Shoulder pain   . Stomach ulcer   . Varicose vein of leg     PAST SURGICAL HISTORY: Past Surgical History:  Procedure Laterality Date  . ABDOMINAL HYSTERECTOMY    . ENDOMETRIAL ABLATION  2007   prior to hysterectomy    SOCIAL HISTORY: Social History   Tobacco Use  . Smoking status: Former Smoker    Packs/day: 0.25    Years: 10.00    Pack years: 2.50    Types: Cigarettes  . Smokeless tobacco: Never Used  Substance Use Topics  . Alcohol use: Yes    Comment: rarely  . Drug use: No    FAMILY HISTORY: Family History  Problem Relation Age of Onset  . Hypertension Mother   . Cancer Mother        breast cancer  . Kidney disease Mother   . Depression Mother   . Obesity Mother   . Breast cancer Mother   . Diabetes Father   . Heart disease Father 41  . Hyperlipidemia Father   . Hypertension Father   . Depression Father   . Anxiety disorder Father   . Sleep apnea Father   . Obesity Father   . Thyroid disease Sister   .  Sudden death Brother   . Cancer Maternal Aunt        breast cancer  . Breast cancer Maternal Aunt        in 3's  . Cancer Paternal Grandfather        brain cancer  . Cancer Maternal Grandmother        breast  . Breast cancer Maternal Grandmother   . Breast cancer Paternal Grandmother    ROS: Review of Systems  Gastrointestinal: Negative for nausea and vomiting.       Positive for GERD.  Musculoskeletal:       Negative for muscle weakness.  PHYSICAL EXAM: There were no vitals taken for this visit. There is no height or weight on file to calculate BMI. Physical Exam Vitals reviewed.  Constitutional:      Appearance: Normal appearance. She is obese.  Cardiovascular:     Rate and Rhythm: Normal rate.     Pulses: Normal pulses.  Pulmonary:     Effort: Pulmonary effort is normal.     Breath sounds: Normal breath sounds.  Musculoskeletal:        General: Normal range of motion.  Skin:    General: Skin is warm and dry.  Neurological:     Mental Status: She is alert and oriented to person, place, and time.  Psychiatric:        Behavior: Behavior normal.   RECENT LABS AND TESTS: BMET    Component Value Date/Time   NA 141 10/27/2019 0800   K 4.5 10/27/2019 0800   CL 103 10/27/2019 0800   CO2 26 10/27/2019 0800   GLUCOSE 112 (H) 10/27/2019 0800   GLUCOSE 113 (H) 06/20/2017 0856   BUN 11 10/27/2019 0800   CREATININE 0.85 10/27/2019 0800   CALCIUM 9.0 10/27/2019 0800   GFRNONAA 85 10/27/2019 0800   GFRAA 98 10/27/2019 0800   Lab Results  Component Value Date   HGBA1C 5.5 10/27/2019   HGBA1C 5.5 04/14/2019   HGBA1C 5.4 07/07/2018   HGBA1C 6.0 (H) 12/30/2017   HGBA1C 5.8 02/21/2017   Lab Results  Component Value Date   INSULIN 25.8 (H) 10/27/2019   INSULIN 28.4 (H) 04/14/2019   INSULIN 18.0 07/07/2018   INSULIN 89.7 (H) 12/30/2017   CBC    Component Value Date/Time   WBC 9.3 12/30/2017 1147   WBC 9.6 06/20/2017 0856   RBC 5.18 12/30/2017 1147   RBC 5.04  06/20/2017 0856   HGB 13.4 12/30/2017 1147   HCT 43.1 12/30/2017 1147   PLT 224.0 06/20/2017 0856   MCV 83 12/30/2017 1147   MCH 25.9 (L) 12/30/2017 1147   MCH 24.1 (L) 04/27/2017 2055   MCHC 31.1 (L) 12/30/2017 1147   MCHC 32.7 06/20/2017 0856   RDW 13.6 12/30/2017 1147   LYMPHSABS 2.0 12/30/2017 1147   MONOABS 0.6 02/21/2017 1510   EOSABS 0.2 12/30/2017 1147   BASOSABS 0.0 12/30/2017 1147   Iron/TIBC/Ferritin/ %Sat    Component Value Date/Time   IRON 85 06/20/2017 0856   TIBC 344 06/20/2017 0856   FERRITIN 55.4 06/20/2017 0856   IRONPCTSAT 25 06/20/2017 0856   Lipid Panel     Component Value Date/Time   CHOL 167 10/27/2019 0800   TRIG 64 10/27/2019 0800   HDL 51 10/27/2019 0800   CHOLHDL 3.3 04/08/2017 1124   CHOLHDL 4.7 02/22/2017 0420   VLDL 11 02/22/2017 0420   LDLCALC 104 (H) 10/27/2019 0800   Hepatic Function Panel     Component Value Date/Time   PROT 6.6 10/27/2019 0800   ALBUMIN 4.2 10/27/2019 0800   AST 13 10/27/2019 0800   ALT 16 10/27/2019 0800   ALKPHOS 69 10/27/2019 0800   BILITOT <0.2 10/27/2019 0800   BILIDIR 0.4 04/14/2017 0315   IBILI 0.7 04/14/2017 0315      Component Value Date/Time   TSH 1.270 12/30/2017 1147   TSH 3.01 06/20/2017 0856   TSH 4.78 (H) 02/21/2017 1510      I, Marianna Payment, am acting as Energy manager for Quillian Quince, MD I have reviewed the above documentation for accuracy and completeness, and I agree with the above. -Kamden Reber  Leafy Ro, MD

## 2019-12-08 ENCOUNTER — Ambulatory Visit: Payer: 59 | Attending: Internal Medicine

## 2019-12-08 DIAGNOSIS — Z20822 Contact with and (suspected) exposure to covid-19: Secondary | ICD-10-CM

## 2019-12-08 DIAGNOSIS — Z20828 Contact with and (suspected) exposure to other viral communicable diseases: Secondary | ICD-10-CM | POA: Diagnosis not present

## 2019-12-09 ENCOUNTER — Encounter (INDEPENDENT_AMBULATORY_CARE_PROVIDER_SITE_OTHER): Payer: Self-pay

## 2019-12-10 LAB — NOVEL CORONAVIRUS, NAA: SARS-CoV-2, NAA: NOT DETECTED

## 2019-12-14 ENCOUNTER — Encounter: Payer: Self-pay | Admitting: Nurse Practitioner

## 2019-12-21 MED FILL — VIT D2 1.25 MG (50,000 UNIT: 1.25 MG | 28 days supply | Qty: 4 | Fill #0

## 2019-12-21 MED FILL — PANTOPRAZOLE SOD DR 40 MG T: 40 | 30 days supply | Qty: 30 | Fill #0

## 2019-12-21 MED FILL — metFORMIN HCL 500 MG TABS: 500 | 30 days supply | Qty: 30 | Fill #0

## 2019-12-21 NOTE — Progress Notes (Signed)
Office: [phone]  /  Fax: [phone]    Date: December 23, 2019   Appointment Start Time: 3:33pm Duration: 40 minutes Provider: Glennie Isle, Psy.D. Type of Session: Individual Therapy  Location of Patient: Home Location of Provider: [facility] Type of Contact: Telepsychological Visit via [employer]  Session Content: Amber Bentley is a 44 y.o. female presenting via [place] for a follow-up appointment to address the previously established treatment goal of decreasing emotional eating. Today's appointment was a telepsychological visit due to COVID-19. Amber Bentley provided verbal consent for today's telepsychological appointment and she is aware she is responsible for securing confidentiality on her end of the session. Prior to proceeding with today's appointment, Amber Bentley's physical location at the time of this appointment was obtained as well a phone number she could be reached at in the event of technical difficulties. Amber Bentley and this provider participated in today's telepsychological service.   This provider conducted a brief check-in. Amber Bentley shared about recent events, including traveling for the holidays. She noted, "Things have been pretty well." A risk assessment was completed. Amber Bentley denied experiencing suicidal and homicidal ideation, plan, and intent since the last appointment with this provider. She reported she continues to have easy access to the developed safety plan, and continues to acknowledge understanding regarding the importance of reaching out to trusted individuals and/or emergency resources if she is unable to ensure safety. Triggers for emotional eating was reviewed. She discussed experiencing challenges with meal planning, noting she often finds herself not caring or wanting to make a change. As such, psychoeducation regarding all or nothing thinking was provided. Additionally, psychoeducation regarding mindfulness was provided to assist with coping. A handout was  provided to Amber Bentley with further information regarding mindfulness, including exercises. This provider also explained the benefit of mindfulness as it relates to emotional eating. Amber Bentley was encouraged to engage in the provided exercises between now and the next appointment with this provider. Amber Bentley agreed. During today's appointment, Amber Bentley was led through a mindfulness exercise involving her senses. Amber Bentley provided verbal consent during today's appointment for this provider to send a handout about mindfulness via e-mail. Moreover, Amber Bentley was receptive to calling [place] before the next appointment with this provider to schedule an initial appointment. Overall, Amber Bentley was receptive to today's appointment as evidenced by openness to sharing, responsiveness to feedback, and willingness to engage in mindfulness exercises to assist with coping.  Mental Status Examination:  Appearance: well groomed and appropriate hygiene  Behavior: appropriate to circumstances Mood: euthymic Affect: mood congruent Speech: normal in rate, volume, and tone Eye Contact: appropriate Psychomotor Activity: appropriate Gait: unable to assess Thought Process: linear, logical, and goal directed  Thought Content/Perception: denies suicidal and homicidal ideation, plan, and intent and no hallucinations, delusions, bizarre thinking or behavior reported or observed Orientation: time, person, place and purpose of appointment Memory/Concentration: memory, attention, language, and fund of knowledge intact  Insight/Judgment: good  Interventions:  Conducted a brief chart review Conducted a risk assessment Provided empathic reflections and validation Reviewed content from the previous session Employed supportive psychotherapy interventions to facilitate reduced distress and to improve coping skills with identified stressors Employed motivational interviewing skills to assess patient's willingness/desire to adhere to  recommended medical treatments and assignments Psychoeducation provided regarding all-or-nothing thinking Psychoeducation provided regarding mindfulness Engaged patient in mindfulness exercise(s)  DSM-5 Diagnoses: 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 Amber Bentley has demonstrated progress in her goal as evidenced by increased awareness of hunger patterns and increased awareness of triggers for emotional eating. Amber Bentley also demonstrates willingness to engage in mindfulness exercises.  Plan: The next appointment will be scheduled in two weeks, which will be in-person. The next session will focus on mindfulness.

## 2019-12-23 ENCOUNTER — Other Ambulatory Visit: Payer: Self-pay

## 2019-12-23 ENCOUNTER — Ambulatory Visit (INDEPENDENT_AMBULATORY_CARE_PROVIDER_SITE_OTHER): Payer: 59 | Admitting: Physician Assistant

## 2019-12-23 ENCOUNTER — Encounter (INDEPENDENT_AMBULATORY_CARE_PROVIDER_SITE_OTHER): Payer: Self-pay | Admitting: Physician Assistant

## 2019-12-23 ENCOUNTER — Ambulatory Visit (INDEPENDENT_AMBULATORY_CARE_PROVIDER_SITE_OTHER): Payer: 59 | Admitting: Psychology

## 2019-12-23 DIAGNOSIS — Z6841 Body Mass Index (BMI) 40.0 and over, adult: Secondary | ICD-10-CM

## 2019-12-23 DIAGNOSIS — F419 Anxiety disorder, unspecified: Secondary | ICD-10-CM

## 2019-12-23 DIAGNOSIS — E559 Vitamin D deficiency, unspecified: Secondary | ICD-10-CM

## 2019-12-23 DIAGNOSIS — E7849 Other hyperlipidemia: Secondary | ICD-10-CM | POA: Diagnosis not present

## 2019-12-23 DIAGNOSIS — F3341 Major depressive disorder, recurrent, in partial remission: Secondary | ICD-10-CM

## 2019-12-23 NOTE — Progress Notes (Signed)
  Office: [phone]  /  Fax: [phone]    Date: January 05, 2020   Time Seen: 4:33pm Duration: 41 minutes Provider: Lawerance Cruel, Psy.D. Type of Session: Individual Therapy  Type of Contact: Face-to-face  Session Content: Genevie is a 44 y.o. female presenting for a follow-up appointment to address the previously established treatment goal of decreasing emotional eating. The session was initiated with a brief check-in. Luanne shared about recent events. Regarding eating, Karley stated she will be resuming journaling, adding "Things aren't going terribly." Notably, Blake shared, "Sometimes I don't care about myself the way I take care of my friends," adding "I feel like I sabotage myself at times." Thus, today's appointment focused on self-compassion and psychoeducation regarding the importance of self-care utilizing the oxygen mask metaphor was provided. She was receptive to sharing with her family a goal of focusing on her eating habits while also enjoying meals with them. Lowen was receptive to today's appointment as evidenced by openness to sharing, responsiveness to feedback, and willingness to implement discussed strategies .  Mental Status Examination:  Appearance: well groomed and appropriate hygiene  Behavior: appropriate to circumstances Mood: euthymic Affect: mood congruent Speech: normal in rate, volume, and tone Eye Contact: appropriate Psychomotor Activity: appropriate Gait: normal Thought Process: linear, logical, and goal directed  Thought Content/Perception: denies suicidal and homicidal ideation, plan, and intent and no evidence of suicidal and homicidal ideation, plan, and intent Orientation: time, person, place and purpose of appointment Memory/Concentration: memory, attention, language, and fund of knowledge intact  Insight/Judgment: good  Interventions:  Conducted a brief chart review Provided empathic reflections and validation Employed supportive psychotherapy  interventions to facilitate reduced distress and to improve coping skills with identified stressors Employed motivational interviewing skills to assess patient's willingness/desire to adhere to recommended medical treatments and assignments Psychoeducation provided regarding self-care Psychoeducation provided regarding self-compassion  DSM-5 Diagnoses: 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 Delita has demonstrated progress in her goal as evidenced by increased awareness of hunger patterns and increased awareness of triggers for emotional eating.  Plan: The next appointment will be scheduled in two to three weeks, which will be via [employer]. The next session will focus on working towards the established treatment goal. In addition, Missey left a voicemail with [facility] to schedule an initial appointment.

## 2019-12-29 NOTE — Progress Notes (Signed)
TeleHealth Visit:  Due to the COVID-19 pandemic, this visit was completed with telemedicine (audio/video) technology to reduce patient and provider exposure as well as to preserve personal protective equipment.   Amber Bentley has verbally consented to this TeleHealth visit. The patient is located at home, the provider is located at the [employer] [facility] office. The participants in this visit include the listed provider and patient and any and all parties involved. The visit was conducted today via telephone.  Amber Bentley was unable to use realtime audiovisual technology today and the telehealth visit was conducted via telephone.  Chief Complaint: OBESITY Amber Bentley is here to discuss her progress with her obesity treatment plan along with follow-up of her obesity related diagnoses. Amber Bentley is on the Category 3 Plan. Amber Bentley states she is walking 60 minutes 7 times per week. (weight not reported)  Today's visit was #: 40 Starting weight: 351 lbs Starting date: 12/30/2017  Interim History: Amber Bentley feels like she "loosened the reigns" at Christmas, and she indulged. She is craving carbohydrates. Her daughter's birthday is coming up this weekend.  Subjective:   Other hyperlipidemia Amber Bentley is on Crestor. She has no chest pain or myalgias. Her last LDL was 104, HDL was 51 and triglycerides were 64 (10/27/19).  Vitamin D deficiency Amber Bentley is on vitamin D and she has no nausea, vomiting or muscle weakness. Her last vitamin D level was 36.2 (10/27/19).  Assessment/Plan:   Other hyperlipidemia Cardiovascular risk and specific lipid/LDL goals reviewed.  We discussed several lifestyle modifications today and Amber Bentley will continue with medications. She will continue to work on diet, exercise and weight loss efforts. Orders and follow up as documented in patient record. We will monitor.  Counseling Intensive lifestyle modifications are the first line treatment for this issue. . Dietary  changes: Increase soluble fiber. Decrease simple carbohydrates. . Exercise changes: Moderate to vigorous-intensity aerobic activity 150 minutes per week if tolerated. . Lipid-lowering medications: see documented in medical record.  Vitamin D deficiency Low Vitamin D level contributes to fatigue and are associated with obesity, breast, and colon cancer. Amber Bentley will continue to take prescription Vitamin D @50 ,000 IU every week and we will monitor. She will follow-up for routine testing of vitamin D, at least 2-3 times per year to avoid over-replacement.  Obesity Amber Bentley is currently in the action stage of change. As such, her goal is to continue with weight loss efforts. She has agreed to on the Category 3 Plan.   We discussed the following exercise goals today: For substantial health benefits, adults should do at least 150 minutes (2 hours and 30 minutes) a week of moderate-intensity, or 75 minutes (1 hour and 15 minutes) a week of vigorous-intensity aerobic physical activity, or an equivalent combination of moderate- and vigorous-intensity aerobic activity. Aerobic activity should be performed in episodes of at least 10 minutes, and preferably, it should be spread throughout the week. Adults should also include muscle-strengthening activities that involve all major muscle groups on 2 or more days a week.  We discussed the following behavioral modification strategies today: decreasing simple carbohydrates, meal planning and cooking strategies and celebration eating strategies.  Amber Bentley has agreed to follow-up with our clinic in 2 weeks. She was informed of the importance of frequent follow-up visits to maximize her success with intensive lifestyle modifications for her multiple health conditions.   Objective:   VITALS: Per patient if applicable, see vitals. GENERAL: Alert and in no acute distress. CARDIOPULMONARY: No increased  WOB. Speaking in clear sentences.  PSYCH: Pleasant and  cooperative. Speech normal rate and rhythm. Affect is appropriate. Insight and judgement are appropriate. Attention is focused, linear, and appropriate.  NEURO: Oriented as arrived to appointment on time with no prompting.   Lab Results  Component Value Date   CREATININE 0.85 10/27/2019   BUN 11 10/27/2019   NA 141 10/27/2019   K 4.5 10/27/2019   CL 103 10/27/2019   CO2 26 10/27/2019   Lab Results  Component Value Date   ALT 16 10/27/2019   AST 13 10/27/2019   ALKPHOS 69 10/27/2019   BILITOT <0.2 10/27/2019   Lab Results  Component Value Date   HGBA1C 5.5 10/27/2019   HGBA1C 5.5 04/14/2019   HGBA1C 5.4 07/07/2018   HGBA1C 6.0 (H) 12/30/2017   HGBA1C 5.8 02/21/2017   Lab Results  Component Value Date   INSULIN 25.8 (H) 10/27/2019   INSULIN 28.4 (H) 04/14/2019   INSULIN 18.0 07/07/2018   INSULIN 89.7 (H) 12/30/2017   Lab Results  Component Value Date   TSH 1.270 12/30/2017   Lab Results  Component Value Date   CHOL 167 10/27/2019   HDL 51 10/27/2019   LDLCALC 104 (H) 10/27/2019   TRIG 64 10/27/2019   CHOLHDL 3.3 04/08/2017   Lab Results  Component Value Date   WBC 9.3 12/30/2017   HGB 13.4 12/30/2017   HCT 43.1 12/30/2017   MCV 83 12/30/2017   PLT 224.0 06/20/2017   Lab Results  Component Value Date   IRON 85 06/20/2017   TIBC 344 06/20/2017   FERRITIN 55.4 06/20/2017    Ref. Range 10/27/2019 08:00  Vitamin D, 25-Hydroxy Latest Ref Range: 30.0 - 100.0 ng/mL 36.2    Attestation Statements:   Reviewed by clinician on day of visit: allergies, medications, problem list, medical history, surgical history, family history, social history, and previous encounter notes.  Time spent on visit including pre-visit chart review and post-visit care was 31 minutes.   Cristi Loron, am acting as Energy manager for [employer], PA-C.  I have reviewed the above documentation for accuracy and completeness, and I agree with the above. Alois Cliche,  PA-C

## 2019-12-30 DIAGNOSIS — I5041 Acute combined systolic (congestive) and diastolic (congestive) heart failure: Secondary | ICD-10-CM | POA: Diagnosis not present

## 2019-12-30 DIAGNOSIS — I159 Secondary hypertension, unspecified: Secondary | ICD-10-CM | POA: Diagnosis not present

## 2019-12-30 DIAGNOSIS — I5032 Chronic diastolic (congestive) heart failure: Secondary | ICD-10-CM | POA: Diagnosis not present

## 2020-01-04 ENCOUNTER — Encounter (INDEPENDENT_AMBULATORY_CARE_PROVIDER_SITE_OTHER): Payer: Self-pay

## 2020-01-05 ENCOUNTER — Other Ambulatory Visit: Payer: Self-pay

## 2020-01-05 ENCOUNTER — Ambulatory Visit (INDEPENDENT_AMBULATORY_CARE_PROVIDER_SITE_OTHER): Payer: 59 | Admitting: Physician Assistant

## 2020-01-05 ENCOUNTER — Ambulatory Visit (INDEPENDENT_AMBULATORY_CARE_PROVIDER_SITE_OTHER): Payer: 59 | Admitting: Psychology

## 2020-01-05 ENCOUNTER — Other Ambulatory Visit: Payer: Self-pay | Admitting: Cardiovascular Disease

## 2020-01-05 ENCOUNTER — Encounter (INDEPENDENT_AMBULATORY_CARE_PROVIDER_SITE_OTHER): Payer: Self-pay | Admitting: Physician Assistant

## 2020-01-05 VITALS — BP 162/88 | HR 80 | Temp 98.5°F | Ht 67.0 in | Wt 328.0 lb

## 2020-01-05 DIAGNOSIS — F419 Anxiety disorder, unspecified: Secondary | ICD-10-CM | POA: Diagnosis not present

## 2020-01-05 DIAGNOSIS — Z6841 Body Mass Index (BMI) 40.0 and over, adult: Secondary | ICD-10-CM | POA: Diagnosis not present

## 2020-01-05 DIAGNOSIS — F3341 Major depressive disorder, recurrent, in partial remission: Secondary | ICD-10-CM

## 2020-01-05 DIAGNOSIS — I10 Essential (primary) hypertension: Secondary | ICD-10-CM

## 2020-01-05 DIAGNOSIS — E559 Vitamin D deficiency, unspecified: Secondary | ICD-10-CM | POA: Diagnosis not present

## 2020-01-05 MED FILL — SPIRONOLACTONE 25 MG TABS: 25 | 30 days supply | Qty: 30 | Fill #0

## 2020-01-06 NOTE — Progress Notes (Signed)
Chief Complaint:   OBESITY Amber Bentley is here to discuss her progress with her obesity treatment plan along with follow-up of her obesity related diagnoses. Amber Bentley is on the Category 3 Plan and states she is following her eating plan approximately 75-80% of the time. Amber Bentley states she is walking 20 minutes 7 times per week.  Today's visit was #: 41 Starting weight: 351 lbs Starting date: 12/30/17 Today's weight: 328 lbs Today's date: 01/05/2020 Total lbs lost to date: 23 Total lbs lost since last in-office visit: 3  Interim History: Amber Bentley reports that she is getting bored with the same foods all the time. She wants to start journaling again.  Subjective:   1. Vitamin D deficiency Amber Bentley is on Vit D and denies nausea, vomiting, or muscle weakness. Her last Vit D level was 36.2.  2. Essential hypertension Amber Bentley's blood pressure is elevated today. She denies headaches, chest pain, or dizziness, but she reports fatigue. She is on carvedilol, Apresoline, losartan, and spironolactone.  Assessment/Plan:   1. Vitamin D deficiency Low Vitamin D level contributes to fatigue and are associated with obesity, breast, and colon cancer. Amber Bentley agrees to continue taking prescription Vitamin D and will follow-up for routine testing of Vitamin D, at least 2-3 times per year to avoid over-replacement.  2. Essential hypertension Amber Bentley is working on healthy weight loss and exercise to improve blood pressure control. Amber Bentley will continue with her blood pressure medications and will increase exercise duration. We will watch for signs of hypotension as she continues her lifestyle modifications.  3. Class 3 severe obesity with serious comorbidity and body mass index (BMI) of 50.0 to 59.9 in adult, unspecified obesity type (HCC) Amber Bentley is currently in the action stage of change. As such, her goal is to continue with weight loss efforts. She has agreed to keeping a food journal and adhering to recommended goals of  1500 calories and 95 grams of protein daily.   Recipes were given to the patient today.  Exercise goals: For substantial health benefits, adults should do at least 150 minutes (2 hours and 30 minutes) a week of moderate-intensity, or 75 minutes (1 hour and 15 minutes) a week of vigorous-intensity aerobic physical activity, or an equivalent combination of moderate- and vigorous-intensity aerobic activity. Aerobic activity should be performed in episodes of at least 10 minutes, and preferably, it should be spread throughout the week. Adults should also include muscle-strengthening activities that involve all major muscle groups on 2 or more days a week.  Behavioral modification strategies: meal planning and cooking strategies and planning for success.  Amber Bentley has agreed to follow-up with our clinic in 2 weeks. She was informed of the importance of frequent follow-up visits to maximize her success with intensive lifestyle modifications for her multiple health conditions.   Objective:   Blood pressure (!) 162/88, pulse 80, temperature 98.5 F (36.9 C), temperature source Oral, height 5\' 7"  (1.702 m), weight (!) 328 lb (148.8 kg), SpO2 96 %. Body mass index is 51.37 kg/m.  General: Cooperative, alert, well developed, in no acute distress. HEENT: Conjunctivae and lids unremarkable. Cardiovascular: Regular rhythm.  Lungs: Normal work of breathing. Neurologic: No focal deficits.   Lab Results  Component Value Date   CREATININE 0.85 10/27/2019   BUN 11 10/27/2019   NA 141 10/27/2019   K 4.5 10/27/2019   CL 103 10/27/2019   CO2 26 10/27/2019   Lab Results  Component Value Date   ALT 16 10/27/2019   AST 13  10/27/2019   ALKPHOS 69 10/27/2019   BILITOT <0.2 10/27/2019   Lab Results  Component Value Date   HGBA1C 5.5 10/27/2019   HGBA1C 5.5 04/14/2019   HGBA1C 5.4 07/07/2018   HGBA1C 6.0 (H) 12/30/2017   HGBA1C 5.8 02/21/2017   Lab Results  Component Value Date   INSULIN 25.8 (H)  10/27/2019   INSULIN 28.4 (H) 04/14/2019   INSULIN 18.0 07/07/2018   INSULIN 89.7 (H) 12/30/2017   Lab Results  Component Value Date   TSH 1.270 12/30/2017   Lab Results  Component Value Date   CHOL 167 10/27/2019   HDL 51 10/27/2019   LDLCALC 104 (H) 10/27/2019   TRIG 64 10/27/2019   CHOLHDL 3.3 04/08/2017   Lab Results  Component Value Date   WBC 9.3 12/30/2017   HGB 13.4 12/30/2017   HCT 43.1 12/30/2017   MCV 83 12/30/2017   PLT 224.0 06/20/2017   Lab Results  Component Value Date   IRON 85 06/20/2017   TIBC 344 06/20/2017   FERRITIN 55.4 06/20/2017   Attestation Statements:   Reviewed by clinician on day of visit: allergies, medications, problem list, medical history, surgical history, family history, social history, and previous encounter notes.  Time spent on visit including pre-visit chart review and post-visit care was 32 minutes.   Wilhemena Durie, am acting as transcriptionist for [employer], PA-C.  I have reviewed the above documentation for accuracy and completeness, and I agree with the above. Abby Potash, PA-C

## 2020-01-11 NOTE — Progress Notes (Signed)
Office: [phone]  /  Fax: [phone]    Date: January 25, 2020   Appointment Start Time: 4:01pm Duration: 32 minutes Provider: Glennie Isle, Psy.D. Type of Session: Individual Therapy  Location of Patient: Parked in car at [facility] Location of Provider: Provider's Home Type of Contact: Telepsychological Visit via [employer]  Session Content: Amber Bentley is a 44 y.o. female presenting via [facility] for a follow-up appointment to address the previously established treatment goal of decreasing emotional eating. Today's appointment was a telepsychological visit due to COVID-19. Amber Bentley provided verbal consent for today's telepsychological appointment and she is aware she is responsible for securing confidentiality on her end of the session. Prior to proceeding with today's appointment, Amber Bentley's physical location at the time of this appointment was obtained as well a phone number she could be reached at in the event of technical difficulties. Amber Bentley and this provider participated in today's telepsychological service.   This provider conducted a brief check-in and verbally administered the PHQ-9 and GAD-7. Amber Bentley stated she was quarantined last week. She also shared she was "pandemic smoking," adding, "I quit last week." In addition, she noted a reduction in emotional eating. Session initially focused on discussing Amber Bentley feeling lack of control when experiencing anxiety. Remainder of today's appointment focused on mindfulness, including its benefits. Psychoeducation regarding formal (e.g., setting aside a specific time daily to engage in an exercise) and informal (e.g., cultivating awareness in the present moment and taking a non-judgmental approach while engaging in day-to-day tasks) mindfulness was provided. This provider also discussed the utilization of YouTube/Spotify for mindfulness exercises (e.g., exercises by Merri Ray). Overall, Amber Bentley was receptive to today's appointment as  evidenced by openness to sharing, responsiveness to feedback, and willingness to engage in mindfulness exercises to assist with coping.  Mental Status Examination:  Appearance: well groomed and appropriate hygiene  Behavior: appropriate to circumstances Mood: euthymic Affect: mood congruent Speech: normal in rate, volume, and tone Eye Contact: appropriate Psychomotor Activity: appropriate Gait: unable to assess Thought Process: linear, logical, and goal directed  Thought Content/Perception: denies suicidal and homicidal ideation, plan, and intent and no hallucinations, delusions, bizarre thinking or behavior reported or observed Orientation: time, person, place and purpose of appointment Memory/Concentration: memory, attention, language, and fund of knowledge intact  Insight/Judgment: good  Structured Assessments Results: The Patient Health Questionnaire-9 (PHQ-9) is a self-report measure that assesses symptoms and severity of depression over the course of the last two weeks. Amber Bentley obtained a score of 4 suggesting minimal depression. Amber Bentley finds the endorsed symptoms to be somewhat difficult. [0= Not at all; 1= Several days; 2= More than half the days; 3= Nearly every day] Little interest or pleasure in doing things 0  Feeling down, depressed, or hopeless 0  Trouble falling or staying asleep, or sleeping too much 2  Feeling tired or having little energy 2  Poor appetite or overeating 0  Feeling bad about yourself --- or that you are a failure or have let yourself or your family down 0  Trouble concentrating on things, such as reading the newspaper or watching television 0  Moving or speaking so slowly that other people could have noticed? Or the opposite --- being so fidgety or restless that you have been moving around a lot more than usual 0  Thoughts that you would be better off dead or hurting yourself in some way 0  PHQ-9 Score 4    The Generalized Anxiety Disorder-7 (GAD-7) is a  brief self-report measure that  assesses symptoms of anxiety over the course of the last two weeks. Amber Bentley obtained a score of 0. [0= Not at all; 1= Several days; 2= Over half the days; 3= Nearly every day] Feeling nervous, anxious, on edge 0  Not being able to stop or control worrying 0  Worrying too much about different things 0  Trouble relaxing 0  Being so restless that it's hard to sit still 0  Becoming easily annoyed or irritable 0  Feeling afraid as if something awful might happen 0  GAD-7 Score 0   Interventions:  Conducted a brief chart review Verbally administered PHQ-9 and GAD-7 for symptom monitoring Provided empathic reflections and validation Reviewed content from the previous session Employed supportive psychotherapy interventions to facilitate reduced distress and to improve coping skills with identified stressors Employed motivational interviewing skills to assess patient's willingness/desire to adhere to recommended medical treatments and assignments Psychoeducation provided regarding mindfulness  DSM-5 Diagnoses: 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 Amber Bentley has demonstrated progress in her goal as evidenced by increased awareness of hunger patterns, increased awareness of triggers for emotional eating and reduction in emotional eating. Amber Bentley also demonstrates willingness to engage in mindfulness exercises.  Plan: Amber Bentley declined future appointments with this provider. She indicated she would call [facility] to schedule an initial appointment again, as she did not hear back. She acknowledged understanding that she may request a follow-up appointment with this provider in the future as long as she is still established with  the clinic. No further follow-up planned by this provider.

## 2020-01-25 ENCOUNTER — Encounter (INDEPENDENT_AMBULATORY_CARE_PROVIDER_SITE_OTHER): Payer: Self-pay | Admitting: Family Medicine

## 2020-01-25 ENCOUNTER — Ambulatory Visit (INDEPENDENT_AMBULATORY_CARE_PROVIDER_SITE_OTHER): Payer: 59 | Admitting: Psychology

## 2020-01-25 ENCOUNTER — Ambulatory Visit (INDEPENDENT_AMBULATORY_CARE_PROVIDER_SITE_OTHER): Payer: 59 | Admitting: Family Medicine

## 2020-01-25 ENCOUNTER — Other Ambulatory Visit: Payer: Self-pay

## 2020-01-25 VITALS — BP 193/83 | HR 72 | Temp 98.6°F | Ht 67.0 in | Wt 333.0 lb

## 2020-01-25 DIAGNOSIS — R7303 Prediabetes: Secondary | ICD-10-CM | POA: Diagnosis not present

## 2020-01-25 DIAGNOSIS — E559 Vitamin D deficiency, unspecified: Secondary | ICD-10-CM

## 2020-01-25 DIAGNOSIS — I10 Essential (primary) hypertension: Secondary | ICD-10-CM | POA: Diagnosis not present

## 2020-01-25 DIAGNOSIS — F3341 Major depressive disorder, recurrent, in partial remission: Secondary | ICD-10-CM

## 2020-01-25 DIAGNOSIS — Z6841 Body Mass Index (BMI) 40.0 and over, adult: Secondary | ICD-10-CM | POA: Diagnosis not present

## 2020-01-25 DIAGNOSIS — Z9189 Other specified personal risk factors, not elsewhere classified: Secondary | ICD-10-CM | POA: Diagnosis not present

## 2020-01-25 DIAGNOSIS — F419 Anxiety disorder, unspecified: Secondary | ICD-10-CM

## 2020-01-25 MED ORDER — METFORMIN HCL 500 MG PO TABS: ORAL_TABLET | ORAL | 0 refills | Status: DC

## 2020-01-25 MED ORDER — VITAMIN D (ERGOCALCIFEROL) 1.25 MG (50000 UNIT) PO CAPS: 50000.0000 [IU] | ORAL_CAPSULE | ORAL | 0 refills | Status: DC

## 2020-01-26 MED FILL — VIT D2 1.25 MG (50,000 UNIT: 1.25 MG | 28 days supply | Qty: 4 | Fill #0

## 2020-01-26 MED FILL — metFORMIN HCL 500 MG TABS: 500 | 30 days supply | Qty: 30 | Fill #0

## 2020-01-26 NOTE — Progress Notes (Signed)
Chief Complaint:   OBESITY Amber Bentley is here to discuss her progress with her obesity treatment plan along with follow-up of her obesity related diagnoses. Amber Bentley is on the keeping a food journal of 1500 calories and 95 grams of protein daily plan and states she is following her eating plan approximately 80% of the time. Amber Bentley states she is swimming 30 to 60 minutes 1 time per week.  Today's visit was #: 46 Starting weight: 351 lbs Starting date: 12/30/2017 Today's weight: 333 lbs Today's date: 01/26/2020 Total lbs lost to date: 18 Total lbs lost since last in-office visit: 0  Interim History: Amber Bentley just went back to journaling and she did really well on keeping within her calories and getting the protein in, but then she struggled to get back on the plan. She is tired of cooking at home.  Subjective:   Essential hypertension Amber Bentley's blood pressure is elevated today. She denies chest pain, chest pressure or headache.  BP Readings from Last 3 Encounters:  01/25/20 (!) 193/83  01/05/20 (!) 162/88  11/17/19 (!) 192/76   Lab Results  Component Value Date   CREATININE 0.85 10/27/2019   CREATININE 0.80 04/14/2019   CREATININE 0.87 12/30/2017   Prediabetes  Geneve has a diagnosis of prediabetes based on her elevated Hgb A1c and was informed this puts her at greater risk of developing diabetes. Her last A1c was 5.5 and last insulin level was 25.8 (10/27/19). Amber Bentley is on metformin and she has no GI side effects.  She continues to work on diet and exercise to decrease her risk of diabetes. She denies nausea or hypoglycemia.  Lab Results  Component Value Date   HGBA1C 5.5 10/27/2019   Lab Results  Component Value Date   INSULIN 25.8 (H) 10/27/2019   INSULIN 28.4 (H) 04/14/2019   INSULIN 18.0 07/07/2018   INSULIN 89.7 (H) 12/30/2017   Vitamin D deficiency  Amber Bentley's Vitamin D level was 36.2 on 10/27/19. She is currently taking vit D. She admits fatigue and denies nausea,  vomiting or muscle weakness. Labs were discussed with patient today.  At risk for diabetes mellitus Amber Bentley is at higher than average risk for developing diabetes due to her obesity and prediabetes. Labs were discussed with patient today.  Assessment/Plan:   Essential hypertension Amber Bentley is working on healthy weight loss and exercise to improve blood pressure control. Patient is to follow up with her cardiologist for medications management. We will watch for signs of hypotension as she continues her lifestyle modifications.  Prediabetes  Amber Bentley agrees to continue metformin 500 mg once daily #30 with no refills and she will continue to work on weight loss, exercise, and decreasing simple carbohydrates to help decrease the risk of diabetes.   Vitamin D deficiency  Low Vitamin D level contributes to fatigue and are associated with obesity, breast, and colon cancer. Amber Bentley agrees to continue to take prescription Vitamin D @50 ,000 IU every week #4 with no refills and she will follow-up for routine testing of Vitamin D, at least 2-3 times per year to avoid over-replacement.  At risk for diabetes mellitus Amber Bentley was given approximately 15 minutes of diabetes education and counseling today. We discussed intensive lifestyle modifications today with an emphasis on weight loss as well as increasing exercise and decreasing simple carbohydrates in her diet. We also reviewed medication options with an emphasis on risk versus benefit of those discussed.   Repetitive spaced learning was employed today to elicit superior memory formation and  behavioral change.  Class 3 severe obesity with serious comorbidity and body mass index (BMI) of 50.0 to 59.9 in adult, unspecified obesity type (HCC) Amber Bentley is currently in the action stage of change. As such, her goal is to continue with weight loss efforts. She has agreed to keeping a food journal and adhering to recommended goals of 1500 calories and 95+ grams of protein  daily.   Exercise goals: Amber Bentley will continue her current exercise regimen.  Behavioral modification strategies: increasing lean protein intake, increasing vegetables, meal planning and cooking strategies, planning for success and keeping a strict food journal.  Amber Bentley has agreed to follow-up with our clinic in 2 weeks. She was informed of the importance of frequent follow-up visits to maximize her success with intensive lifestyle modifications for her multiple health conditions.   Objective:   Blood pressure (!) 193/83, pulse 72, temperature 98.6 F (37 C), temperature source Oral, height 5\' 7"  (1.702 m), weight (!) 333 lb (151 kg), SpO2 97 %. Body mass index is 52.16 kg/m.  General: Cooperative, alert, well developed, in no acute distress. HEENT: Conjunctivae and lids unremarkable. Cardiovascular: Regular rhythm.  Lungs: Normal work of breathing. Neurologic: No focal deficits.   Lab Results  Component Value Date   CREATININE 0.85 10/27/2019   BUN 11 10/27/2019   NA 141 10/27/2019   K 4.5 10/27/2019   CL 103 10/27/2019   CO2 26 10/27/2019   Lab Results  Component Value Date   ALT 16 10/27/2019   AST 13 10/27/2019   ALKPHOS 69 10/27/2019   BILITOT <0.2 10/27/2019   Lab Results  Component Value Date   HGBA1C 5.5 10/27/2019   HGBA1C 5.5 04/14/2019   HGBA1C 5.4 07/07/2018   HGBA1C 6.0 (H) 12/30/2017   HGBA1C 5.8 02/21/2017   Lab Results  Component Value Date   INSULIN 25.8 (H) 10/27/2019   INSULIN 28.4 (H) 04/14/2019   INSULIN 18.0 07/07/2018   INSULIN 89.7 (H) 12/30/2017   Lab Results  Component Value Date   TSH 1.270 12/30/2017   Lab Results  Component Value Date   CHOL 167 10/27/2019   HDL 51 10/27/2019   LDLCALC 104 (H) 10/27/2019   TRIG 64 10/27/2019   CHOLHDL 3.3 04/08/2017   Lab Results  Component Value Date   WBC 9.3 12/30/2017   HGB 13.4 12/30/2017   HCT 43.1 12/30/2017   MCV 83 12/30/2017   PLT 224.0 06/20/2017   Lab Results  Component  Value Date   IRON 85 06/20/2017   TIBC 344 06/20/2017   FERRITIN 55.4 06/20/2017    Ref. Range 10/27/2019 08:00  Vitamin D, 25-Hydroxy Latest Ref Range: 30.0 - 100.0 ng/mL 36.2    Attestation Statements:   Reviewed by clinician on day of visit: allergies, medications, problem list, medical history, surgical history, family history, social history, and previous encounter notes.  I, 13/09/2019, am acting as transcriptionist for [place] Crane, MD. I have reviewed the above documentation for accuracy and completeness, and I agree with the above. - Filbert Schilder, MD

## 2020-01-30 DIAGNOSIS — I159 Secondary hypertension, unspecified: Secondary | ICD-10-CM | POA: Diagnosis not present

## 2020-01-30 DIAGNOSIS — I5032 Chronic diastolic (congestive) heart failure: Secondary | ICD-10-CM | POA: Diagnosis not present

## 2020-01-30 DIAGNOSIS — I5041 Acute combined systolic (congestive) and diastolic (congestive) heart failure: Secondary | ICD-10-CM | POA: Diagnosis not present

## 2020-02-09 ENCOUNTER — Ambulatory Visit (INDEPENDENT_AMBULATORY_CARE_PROVIDER_SITE_OTHER): Payer: 59 | Admitting: Family Medicine

## 2020-02-09 ENCOUNTER — Other Ambulatory Visit: Payer: Self-pay

## 2020-02-09 VITALS — BP 170/96 | HR 70 | Temp 98.2°F | Ht 67.0 in | Wt 331.0 lb

## 2020-02-09 DIAGNOSIS — I10 Essential (primary) hypertension: Secondary | ICD-10-CM | POA: Diagnosis not present

## 2020-02-09 DIAGNOSIS — Z6841 Body Mass Index (BMI) 40.0 and over, adult: Secondary | ICD-10-CM

## 2020-02-09 DIAGNOSIS — E7849 Other hyperlipidemia: Secondary | ICD-10-CM

## 2020-02-10 NOTE — Progress Notes (Signed)
Chief Complaint:   OBESITY Amber Bentley is here to discuss her progress with her obesity treatment plan along with follow-up of her obesity related diagnoses. Amber Bentley is keeping a food journal of 1500 calories and 95 grams of protein daily and states she is following her eating plan approximately 70 to 80% of the time. 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  Today's visit was #: 32 Starting weight: 351 lbs Starting date: 12/30/2017 Today's weight: 331 lbs Today's date: 02/09/2020 Total lbs lost to date: 20 Total lbs lost since last in-office visit: 2  Interim History: Amber Bentley feels the last few weeks were fairly successful in terms of mindful eating. She did make less indulgent choices when ordering out. She is getting the vaccine in four days.  Subjective:   Essential hypertension [name] has worsening hypertension. Her blood pressure is elevated today (slightly improved from previous appointment). She denies chest pain, chest pressure or headache. She is still taking all prescriptions. Amber Bentley voices, she doesn't like medication adjustments, because she often feels poorly after changes.  BP Readings from Last 3 Encounters:  02/09/20 (!) 170/96  01/25/20 (!) 193/83  01/05/20 (!) 162/88   Lab Results  Component Value Date   CREATININE 0.85 10/27/2019   CREATININE 0.80 04/14/2019   CREATININE 0.87 12/30/2017   Other hyperlipidemia Amber Bentley had LDL of 104 on November 10th. She is on Crestor currently. Amber Bentley denies myalgias.  Lab Results  Component Value Date   CHOL 167 10/27/2019   HDL 51 10/27/2019   LDLCALC 104 (H) 10/27/2019   TRIG 64 10/27/2019   CHOLHDL 3.3 04/08/2017   Lab Results  Component Value Date   ALT 16 10/27/2019   AST 13 10/27/2019   ALKPHOS 69 10/27/2019   BILITOT <0.2 10/27/2019   Assessment/Plan:   Essential hypertension Shavonda is working on healthy weight loss and exercise to improve blood pressure  control. We will follow up blood pressure at the next appointment. Patient was encouraged to reach out to cardiology for further medication management. We will watch for signs of hypotension as she continues her lifestyle modifications.  Other hyperlipidemia We will repeat labs at the next appointment. Madiha will continue to work on diet, exercise and weight loss efforts. Orders and follow up as documented in patient record.   Class 3 severe obesity with serious comorbidity and body mass index (BMI) of 50.0 to 59.9 in adult, unspecified obesity type (HCC) Amber Bentley is currently in the action stage of change. As such, her goal is to continue with weight loss efforts. She has agreed to keeping a food journal and adhering to recommended goals of 1500 calories and 95+ grams of protein daily.   Behavioral modification strategies: increasing lean protein intake, increasing vegetables, meal planning and cooking strategies, keeping healthy foods in the home and planning for success.  Amber Bentley has agreed to follow-up with our clinic in 2 weeks. She was informed of the importance of frequent follow-up visits to maximize her success with intensive lifestyle modifications for her multiple health conditions.   Objective:   Blood pressure (!) 170/96, pulse 70, temperature 98.2 F (36.8 C), temperature source Oral, height 5\' 7"  (1.702 m), weight (!) 331 lb (150.1 kg), SpO2 96 %. Body mass index is 51.84 kg/m.  General: Cooperative, alert, well developed, in no acute distress. HEENT: Conjunctivae and lids unremarkable. Cardiovascular: Regular rhythm.  Lungs: Normal work of breathing. Neurologic: No focal deficits.  Lab Results  Component Value Date   CREATININE 0.85 10/27/2019   BUN 11 10/27/2019   NA 141 10/27/2019   K 4.5 10/27/2019   CL 103 10/27/2019   CO2 26 10/27/2019   Lab Results  Component Value Date   ALT 16 10/27/2019   AST 13 10/27/2019   ALKPHOS 69 10/27/2019   BILITOT <0.2 10/27/2019     Lab Results  Component Value Date   HGBA1C 5.5 10/27/2019   HGBA1C 5.5 04/14/2019   HGBA1C 5.4 07/07/2018   HGBA1C 6.0 (H) 12/30/2017   HGBA1C 5.8 02/21/2017   Lab Results  Component Value Date   INSULIN 25.8 (H) 10/27/2019   INSULIN 28.4 (H) 04/14/2019   INSULIN 18.0 07/07/2018   INSULIN 89.7 (H) 12/30/2017   Lab Results  Component Value Date   TSH 1.270 12/30/2017   Lab Results  Component Value Date   CHOL 167 10/27/2019   HDL 51 10/27/2019   LDLCALC 104 (H) 10/27/2019   TRIG 64 10/27/2019   CHOLHDL 3.3 04/08/2017   Lab Results  Component Value Date   WBC 9.3 12/30/2017   HGB 13.4 12/30/2017   HCT 43.1 12/30/2017   MCV 83 12/30/2017   PLT 224.0 06/20/2017   Lab Results  Component Value Date   IRON 85 06/20/2017   TIBC 344 06/20/2017   FERRITIN 55.4 06/20/2017    Ref. Range 10/27/2019 08:00  Vitamin D, 25-Hydroxy Latest Ref Range: 30.0 - 100.0 ng/mL 36.2    Attestation Statements:   Reviewed by clinician on day of visit: allergies, medications, problem list, medical history, surgical history, family history, social history, and previous encounter notes.  Time spent on visit including pre-visit chart review and post-visit care was 25 minutes.   I, Nevada Crane, am acting as transcriptionist for Reuben Likes, MD. I have reviewed the above documentation for accuracy and completeness, and I agree with the above. - Debbra Riding, MD

## 2020-02-13 ENCOUNTER — Ambulatory Visit: Payer: 59 | Attending: Internal Medicine

## 2020-02-13 DIAGNOSIS — Z23 Encounter for immunization: Secondary | ICD-10-CM | POA: Insufficient documentation

## 2020-02-13 NOTE — Progress Notes (Signed)
   Covid-19 Vaccination Clinic  Name:  Nickolette Espinola    MRN: [id number] DOB: 14-Oct-1976  02/13/2020  Ms. Tilley was observed post Covid-19 immunization for 15 minutes without incidence. She was provided with Vaccine Information Sheet and instruction to access the V-Safe system.   Ms. Eckmann was instructed to call 911 with any severe reactions post vaccine: [name] Difficulty breathing  . Swelling of your face and throat  . A fast heartbeat  . A bad rash all over your body  . Dizziness and weakness    Immunizations Administered    Name Date Dose VIS Date Route   Pfizer COVID-19 Vaccine 02/13/2020  9:43 AM 0.3 mL 11/27/2019 Intramuscular   Manufacturer: [employer], [employer]   Lot: [id number]   NDC: [address]-0

## 2020-02-17 ENCOUNTER — Other Ambulatory Visit: Payer: Self-pay | Admitting: Cardiovascular Disease

## 2020-02-17 ENCOUNTER — Other Ambulatory Visit: Payer: Self-pay | Admitting: *Deleted

## 2020-02-17 DIAGNOSIS — I10 Essential (primary) hypertension: Secondary | ICD-10-CM

## 2020-02-17 MED ORDER — SPIRONOLACTONE 25 MG PO TABS: 25.0000 mg | ORAL_TABLET | Freq: Every day | ORAL | 0 refills | Status: DC

## 2020-02-17 MED FILL — ROSUVASTATIN CALCIUM 10 MG: 10 | 90 days supply | Qty: 90 | Fill #2

## 2020-02-17 MED FILL — SPIRONOLACTONE 25 MG TABS: 25 | 15 days supply | Qty: 15 | Fill #0

## 2020-02-25 ENCOUNTER — Other Ambulatory Visit: Payer: Self-pay | Admitting: Cardiology

## 2020-02-25 ENCOUNTER — Encounter (INDEPENDENT_AMBULATORY_CARE_PROVIDER_SITE_OTHER): Payer: Self-pay | Admitting: Family Medicine

## 2020-02-25 ENCOUNTER — Ambulatory Visit (INDEPENDENT_AMBULATORY_CARE_PROVIDER_SITE_OTHER): Payer: 59 | Admitting: Family Medicine

## 2020-02-25 MED FILL — hydrALAZINE HCL 25 MG TABS: 25 | 90 days supply | Qty: 180 | Fill #0

## 2020-02-26 MED FILL — PANTOPRAZOLE SOD DR 40 MG T: 40 | 90 days supply | Qty: 90 | Fill #0

## 2020-02-27 DIAGNOSIS — I5041 Acute combined systolic (congestive) and diastolic (congestive) heart failure: Secondary | ICD-10-CM | POA: Diagnosis not present

## 2020-02-27 DIAGNOSIS — I5032 Chronic diastolic (congestive) heart failure: Secondary | ICD-10-CM | POA: Diagnosis not present

## 2020-02-27 DIAGNOSIS — I159 Secondary hypertension, unspecified: Secondary | ICD-10-CM | POA: Diagnosis not present

## 2020-03-09 ENCOUNTER — Ambulatory Visit: Payer: 59 | Attending: Internal Medicine

## 2020-03-09 DIAGNOSIS — Z23 Encounter for immunization: Secondary | ICD-10-CM

## 2020-03-09 NOTE — Progress Notes (Signed)
   Covid-19 Vaccination Clinic  Name:  Amber Bentley    MRN: [id number] DOB: 13-Dec-1976  03/09/2020  [name] was observed post Covid-19 immunization for 15 minutes without incident. She was provided with Vaccine Information Sheet and instruction to access the V-Safe system.   [name] was instructed to call 911 with any severe reactions post vaccine: [name] Difficulty breathing  . Swelling of face and throat  . A fast heartbeat  . A bad rash all over body  . Dizziness and weakness   Immunizations Administered    Name Date Dose VIS Date Route   Pfizer COVID-19 Vaccine 03/09/2020  2:50 PM 0.3 mL 11/27/2019 Intramuscular   Manufacturer: [employer], [employer]   Lot: [id number]   NDC: [address]-1

## 2020-03-11 ENCOUNTER — Other Ambulatory Visit: Payer: Self-pay

## 2020-03-11 DIAGNOSIS — I10 Essential (primary) hypertension: Secondary | ICD-10-CM

## 2020-03-11 MED ORDER — SPIRONOLACTONE 25 MG PO TABS: ORAL_TABLET | ORAL | 0 refills | Status: DC

## 2020-03-11 MED FILL — SPIRONOLACTONE 25 MG TABS: 25 | 90 days supply | Qty: 90 | Fill #0

## 2020-03-29 DIAGNOSIS — I159 Secondary hypertension, unspecified: Secondary | ICD-10-CM | POA: Diagnosis not present

## 2020-03-29 DIAGNOSIS — I5041 Acute combined systolic (congestive) and diastolic (congestive) heart failure: Secondary | ICD-10-CM | POA: Diagnosis not present

## 2020-03-29 DIAGNOSIS — I5032 Chronic diastolic (congestive) heart failure: Secondary | ICD-10-CM | POA: Diagnosis not present

## 2020-04-04 ENCOUNTER — Other Ambulatory Visit (INDEPENDENT_AMBULATORY_CARE_PROVIDER_SITE_OTHER): Payer: Self-pay

## 2020-04-04 DIAGNOSIS — R7303 Prediabetes: Secondary | ICD-10-CM

## 2020-04-04 MED ORDER — METFORMIN HCL 500 MG PO TABS: ORAL_TABLET | ORAL | 0 refills | Status: DC

## 2020-04-04 MED FILL — METFORMIN HCL 500 MG TABS: 500 | 30 days supply | Qty: 30 | Fill #0

## 2020-04-06 DIAGNOSIS — R05 Cough: Secondary | ICD-10-CM | POA: Diagnosis not present

## 2020-04-10 ENCOUNTER — Encounter: Payer: Self-pay | Admitting: Nurse Practitioner

## 2020-04-11 ENCOUNTER — Telehealth: Payer: Self-pay | Admitting: Nurse Practitioner

## 2020-04-11 MED ORDER — FUROSEMIDE 20 MG PO TABS: 40.0000 mg | ORAL_TABLET | Freq: Every day | ORAL | 11 refills | Status: DC

## 2020-04-11 MED ORDER — CARVEDILOL 12.5 MG PO TABS: 12.5000 mg | ORAL_TABLET | Freq: Two times a day (BID) | ORAL | 11 refills | Status: DC

## 2020-04-11 MED FILL — CARVEDILOL 12.5 MG TABLET: 12.5 | 30 days supply | Qty: 60 | Fill #0

## 2020-04-11 MED FILL — FUROSEMIDE 20 MG TABS: 20 | 30 days supply | Qty: 60 | Fill #0

## 2020-04-11 NOTE — Telephone Encounter (Signed)
  I'm getting over a cold (I tested negative for covid and flu)...only I seem to not be getting over it well.  I am having CHF symptoms...fatigue, heart sensations (like it is beating too hard or too fast), low oxygen (90), and...the worst, astronomical blood pressure readings. Last night I had a couple of readings that scared the pants off of me. Highest was 240/110.Marland KitchenMarland Kitchenbefore finally settling back down to 136/99 before bed. I feel nervous about these signs and think it would be a good idea to come in to get checked out. I'm keeping a close eye on things and continuing to faithfully take my meds. I know that viruses can cause things to be wonky. But I'm just nervous. Can you please give me a call when you can? [phone]  Called patient in response to the MyChart message above. She states she has had cold s/s but tested negative for Covid and flu. She reports very high BP reading yesterday. She is currently at work but requests an appointment for follow-up of her concerns. She has hx of HFrEF in 2018 but her EF was 60-65% in 08/2018. She currently takes hydralazine 25 mg twice daily. I advised her to take an additional hydralazine 25 mg at lunch and that I will call her later to schedule her an appointment. She verbalized understanding and agreement and thanked me for the call.

## 2020-04-11 NOTE — Telephone Encounter (Signed)
Reviewed plan of care with patient who verbalized agreement. She also request a refill of her carvedilol because she only has 3 pills left. She states she feels like she is experiencing s/s of CHF again and she is thankful for the soon appointment. 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 She is aware of appointment time/date/provder and she thanked me for the help.

## 2020-04-11 NOTE — Telephone Encounter (Signed)
Reviewed patient's concerns with Dr. Elease Hashimoto who advised patient should start furosemide 40 mg daily and have an appointment with an APP this week. Patient has been put on Vin Bhagat's schedule for Wednesday 4/28.  I left a message for patient to call back.

## 2020-04-13 ENCOUNTER — Ambulatory Visit (INDEPENDENT_AMBULATORY_CARE_PROVIDER_SITE_OTHER): Payer: 59 | Admitting: Physician Assistant

## 2020-04-13 ENCOUNTER — Encounter: Payer: Self-pay | Admitting: Physician Assistant

## 2020-04-13 ENCOUNTER — Other Ambulatory Visit: Payer: Self-pay

## 2020-04-13 VITALS — BP 150/92 | HR 79 | Ht 67.0 in | Wt 335.4 lb

## 2020-04-13 DIAGNOSIS — I272 Pulmonary hypertension, unspecified: Secondary | ICD-10-CM | POA: Diagnosis not present

## 2020-04-13 DIAGNOSIS — G4733 Obstructive sleep apnea (adult) (pediatric): Secondary | ICD-10-CM

## 2020-04-13 DIAGNOSIS — R9431 Abnormal electrocardiogram [ECG] [EKG]: Secondary | ICD-10-CM

## 2020-04-13 DIAGNOSIS — Z9989 Dependence on other enabling machines and devices: Secondary | ICD-10-CM | POA: Diagnosis not present

## 2020-04-13 DIAGNOSIS — R5383 Other fatigue: Secondary | ICD-10-CM | POA: Diagnosis not present

## 2020-04-13 DIAGNOSIS — I10 Essential (primary) hypertension: Secondary | ICD-10-CM | POA: Diagnosis not present

## 2020-04-13 DIAGNOSIS — I5042 Chronic combined systolic (congestive) and diastolic (congestive) heart failure: Secondary | ICD-10-CM

## 2020-04-13 MED ORDER — CARVEDILOL 25 MG PO TABS: 25.0000 mg | ORAL_TABLET | Freq: Two times a day (BID) | ORAL | 3 refills | Status: DC

## 2020-04-13 MED FILL — CARVEDILOL 25 MG TABLET: 25 | 90 days supply | Qty: 180 | Fill #0

## 2020-04-13 NOTE — Patient Instructions (Addendum)
Medication Instructions:  Your physician has recommended you make the following change in your medication:  1.  INCREASE the Carvedilol to 25 mg twice a day  .  *If you need a refill on your cardiac medications before your next appointment, please call your pharmacy*   Lab Work: TODAY:  BMET, PRO BNP, CBC, & TSH   If you have labs (blood work) drawn today and your tests are completely normal, you will receive your results only by: [name] MyChart Message (if you have MyChart) OR . A paper copy in the mail If you have any lab test that is abnormal or we need to change your treatment, we will call you to review the results.   Testing/Procedures: None ordered   Follow-Up: At [facility], you and your health needs are our priority.  As part of our continuing mission to provide you with exceptional heart care, we have created designated Provider Care Teams.  These Care Teams include your primary Cardiologist (physician) and Advanced Practice Providers (APPs -  Physician Assistants and Nurse Practitioners) who all work together to provide you with the care you need, when you need it.  We recommend signing up for the patient portal called "MyChart".  Sign up information is provided on this After Visit Summary.  MyChart is used to connect with patients for Virtual Visits (Telemedicine).  Patients are able to view lab/test results, encounter notes, upcoming appointments, etc.  Non-urgent messages can be sent to your provider as well.   To learn more about what you can do with MyChart, go to [email or web address].    Your next appointment:   2 WEEKS  04/22/2020 ARRIVE AT 9:45 TO SEE DR. NAHSER  The format for your next appointment:   In Person  Provider:   You may see Kristeen Miss, MD or one of the following Advanced Practice Providers on your designated Care Team:    Tereso Newcomer, PA-C  Vin [place], [place]  Berton Bon, NP    Other Instructions

## 2020-04-13 NOTE — Progress Notes (Signed)
Cardiology Office Note    Date:  04/13/2020   ID:  Amber Bentley, DOB Dec 28, 1975, MRN [id number]  PCP:  Anne Ng, NP  Cardiologist: Dr. Elease Hashimoto   Chief Complaint: Elevated BP and cold symptoms   History of Present Illness:   Amber Bentley is a 44 y.o. female  with a history of hypertension, moderate pulmonary hypertension,  sleep apnea on CPAP, morbid obesity and former tobacco added to schedule with multiple problems.   Seen 02/2017 for hypertensive urgency. Echo showed LVEF of 45-50% with diffuse hypokinesis and grade 2 diastolic dysfunction.  Repeat echo 08/2018 showed improved LVEF to 60-65%. PASP of 38 mmHg (PASP was 59 mmHg in 02/2017) No WM abnormality.   Last seen by APP 06/2019. Was dealing with elevated BP.   Last week patient had sudden onset shortness of breath, cough and subjective fever.  She had Covid test x2.  Both negative.  She continues to feel short of breath fatigue and tired and started on Lasix by Dr. Elease Hashimoto.  She took first lasix dose yesterday and her breathing improved.  She still has cough.  Intermittent upper sternal sharp dull/pressure type pain.  Does not exacerbate with ambulation.  She has her both Covid vaccine, last dose in March.  Blood pressure running high.  Symptoms somewhat similar when admitted in 2018.  Compliant with CPAP.  Past Medical History:  Diagnosis Date  . Anemia   . Anxiety   . Cardiomegaly   . Chest pain   . CHF (congestive heart failure) (HCC)   . Constipation   . Depression   . Dyspnea   . Fatigue   . Food allergy    celery  . GERD (gastroesophageal reflux disease)   . Hip pain   . HLD (hyperlipidemia)   . HTN (hypertension)   . Infertility, female   . Lactose intolerance   . Leg edema   . Lower back pain   . OSA on CPAP   . Palpitations   . Prediabetes   . Shoulder pain   . Stomach ulcer   . Varicose vein of leg     Past Surgical History:  Procedure Laterality Date  . ABDOMINAL HYSTERECTOMY     . ENDOMETRIAL ABLATION  2007   prior to hysterectomy    Current Medications: Prior to Admission medications   Medication Sig Start Date End Date Taking? Authorizing Provider  carvedilol (COREG) 12.5 MG tablet Take 1 tablet (12.5 mg total) by mouth 2 (two) times daily. 04/11/20 07/10/20  Nahser, Deloris Ping, MD  furosemide (LASIX) 20 MG tablet Take 2 tablets (40 mg total) by mouth daily. 04/11/20 07/10/20  Nahser, Deloris Ping, MD  hydrALAZINE (APRESOLINE) 25 MG tablet TAKE 1 TABLET BY MOUTH TWICE DAILY 02/25/20   Georgie Chard D, NP  losartan (COZAAR) 100 MG tablet Take 1 tablet (100 mg total) by mouth daily. 09/02/19   Georgie Chard D, NP  metFORMIN (GLUCOPHAGE) 500 MG tablet TAKE 1 TABLET BY MOUTH ONCE DAILY WITH BREAKFAST 04/04/20   Filbert Schilder, MD  pantoprazole (PROTONIX) 40 MG tablet Take 1 tablet (40 mg total) by mouth daily. 12/01/19   Quillian Quince D, MD  rosuvastatin (CRESTOR) 10 MG tablet Take 1 tablet (10 mg total) by mouth daily. 05/15/19   Georgie Chard D, NP  spironolactone (ALDACTONE) 25 MG tablet TAKE 1 TABLET BY MOUTH DAILY 03/11/20   Nahser, Deloris Ping, MD  Vitamin D, Ergocalciferol, (DRISDOL) 1.25 MG (50000 UNIT) CAPS capsule Take  1 capsule (50,000 Units total) by mouth every 7 (seven) days. 01/25/20   Filbert Schilder, MD    Allergies:   Food and Aleve [naproxen]   Social History   Socioeconomic History  . Marital status: Married    Spouse name: Victorino Dike  . Number of children: 2  . Years of education: MA  . Highest education level: Not on file  Occupational History  . Occupation: [employer]  Tobacco Use  . Smoking status: Former Smoker    Packs/day: 0.25    Years: 10.00    Pack years: 2.50    Types: Cigarettes  . Smokeless tobacco: Never Used  Substance and Sexual Activity  . Alcohol use: Yes    Comment: rarely  . Drug use: No  . Sexual activity: Not on file  Other Topics Concern  . Not on file  Social History Narrative   Drinks  coffee daily    Social Determinants of Health   Financial Resource Strain:   . Difficulty of Paying Living Expenses:   Food Insecurity:   . Worried About Programme researcher, broadcasting/film/video in the Last Year:   . Barista in the Last Year:   Transportation Needs:   . Freight forwarder (Medical):   [name] Lack of Transportation (Non-Medical):   Physical Activity:   . Days of Exercise per Week:   . Minutes of Exercise per Session:   Stress:   . Feeling of Stress :   Social Connections:   . Frequency of Communication with Friends and Family:   . Frequency of Social Gatherings with Friends and Family:   . Attends Religious Services:   . Active Member of Clubs or Organizations:   . Attends Banker Meetings:   [name] Marital Status:      Family History:  The patient's family history includes Anxiety disorder in her father; Breast cancer in her maternal aunt, maternal grandmother, mother, and paternal grandmother; Cancer in her maternal aunt, maternal grandmother, mother, and paternal grandfather; Depression in her father and mother; Diabetes in her father; Heart disease (age of onset: 50) in her father; Hyperlipidemia in her father; Hypertension in her father and mother; Kidney disease in her mother; Obesity in her father and mother; Sleep apnea in her father; Sudden death in her brother; Thyroid disease in her sister.   ROS:   Please see the history of present illness.    ROS All other systems reviewed and are negative.   PHYSICAL EXAM:   VS:  BP (!) 150/92 (BP Location: Right Arm, Patient Position: Sitting, Cuff Size: Normal)   Pulse 79   Ht 5\' 7"  (1.702 m)   Wt (!) 335 lb 6.4 oz (152.1 kg)   BMI 52.53 kg/m    GEN: Obese female in no acute distress  HEENT: normal  Neck: no JVD, carotid bruits, or masses Cardiac: RRR; no murmurs, rubs, or gallops,no edema  Respiratory: diminished breath sound on baseGI: soft, nontender, nondistended, + BS MS: no deformity or atrophy  Skin: warm  and dry, no rash Neuro:  Alert and Oriented x 3, Strength and sensation are intact Psych: euthymic mood, full affect  Wt Readings from Last 3 Encounters:  04/13/20 (!) 335 lb 6.4 oz (152.1 kg)  02/09/20 (!) 331 lb (150.1 kg)  01/25/20 (!) 333 lb (151 kg)      Studies/Labs Reviewed:   EKG:  EKG is ordered today.  The ekg ordered today demonstrates NSR with TWI in lateral  leads   Recent Labs: 10/27/2019: ALT 16; BUN 11; Creatinine, Ser 0.85; Potassium 4.5; Sodium 141   Lipid Panel    Component Value Date/Time   CHOL 167 10/27/2019 0800   TRIG 64 10/27/2019 0800   HDL 51 10/27/2019 0800   CHOLHDL 3.3 04/08/2017 1124   CHOLHDL 4.7 02/22/2017 0420   VLDL 11 02/22/2017 0420   LDLCALC 104 (H) 10/27/2019 0800    Additional studies/ records that were reviewed today include:   Echocardiogram: 08/2018 Left ventricle: The cavity size was normal. There was mild focal  basal hypertrophy of the septum. Systolic function was normal.  The estimated ejection fraction was in the range of 60% to 65%.  Wall motion was normal; there were no regional wall motion  abnormalities. Left ventricular diastolic function parameters  were normal.  - Right atrium: The atrium was mildly dilated.  - Pulmonary arteries: Systolic pressure was mildly increased. PA  peak pressure: 38 mm Hg (S).    ASSESSMENT & PLAN:    1. Shortness of breath, fatigue, nonproductive cough Symptoms could be due to pulmonary edema secondary to elevated blood pressure for past 1 week.  She had subjective fever and symptoms started.  Covid negative x2.  Still has cough and shortness of breath with intermittent chest discomfort.  No pleuritic symptoms.  No recent travel.  She has her Covid vaccine. -Check CBC, BMP, BNP and TSH -Continue Lasix  2.  Abnormal EKG -EKG shows new T wave inversion in lateral leads -Patient denies any significant chest pain episode in the past week however has intermittent chest  discomfort -Discussed possible stress test however patient states that Dr. Elease Hashimoto has previously defer due to obsity>> only option would be cath   3. OSA on CPAP - Compliant   4. HTN - Elevated - Increase Coreg to 25mg  BID - continue hydralazine, cozzar and spironolactone at current dose   Medication Adjustments/Labs and Tests Ordered: Current medicines are reviewed at length with the patient today.  Concerns regarding medicines are outlined above.  Medication changes, Labs and Tests ordered today are listed in the Patient Instructions below. Patient Instructions  Medication Instructions:  Your physician has recommended you make the following change in your medication:  1.  INCREASE the Carvedilol to 25 mg twice a day  .  *If you need a refill on your cardiac medications before your next appointment, please call your pharmacy*   Lab Work: TODAY:  BMET, PRO BNP, CBC, & TSH   If you have labs (blood work) drawn today and your tests are completely normal, you will receive your results only by: MyChart Message (if you have MyChart) OR . A paper copy in the mail If you have any lab test that is abnormal or we need to change your treatment, we will call you to review the results.   Testing/Procedures: None ordered   Follow-Up: At [facility] you and your health needs are our priority.  As part of our continuing mission to provide you with exceptional heart care, we have created designated Provider Care Teams.  These Care Teams include your primary Cardiologist (physician) and Advanced Practice Providers (APPs -  Physician Assistants and Nurse Practitioners) who all work together to provide you with the care you need, when you need it.  We recommend signing up for the patient portal called "MyChart".  Sign up information is provided on this After Visit Summary.  MyChart is used to connect with patients for Virtual Visits (Telemedicine).  Patients are able to view lab/test  results, encounter notes, upcoming appointments, etc.  Non-urgent messages can be sent to your provider as well.   To learn more about what you can do with MyChart, go to [email or web address].    Your next appointment:   2 WEEKS  04/22/2020 ARRIVE AT 9:45 TO SEE DR. NAHSER  The format for your next appointment:   In Person  Provider:   You may see Mertie Moores, MD or one of the following Advanced Practice Providers on your designated Care Team:    Richardson Dopp, PA-C  Vin [place], [place]  Daune Perch, NP    Other Instructions      Jarrett Soho, [place]  04/13/2020 4:05 PM    [facility] [address], [place], [place]  [address] Phone: [phone]; Fax: [phone]

## 2020-04-14 ENCOUNTER — Telehealth: Payer: Self-pay | Admitting: *Deleted

## 2020-04-14 LAB — CBC
Hematocrit: 46.9 % — ABNORMAL HIGH (ref 34.0–46.6)
Hemoglobin: 14.7 g/dL (ref 11.1–15.9)
MCH: 26.2 pg — ABNORMAL LOW (ref 26.6–33.0)
MCHC: 31.3 g/dL — ABNORMAL LOW (ref 31.5–35.7)
MCV: 84 fL (ref 79–97)
Platelets: 208 10*3/uL (ref 150–450)
RBC: 5.61 x10E6/uL — ABNORMAL HIGH (ref 3.77–5.28)
RDW: 12.7 % (ref 11.7–15.4)
WBC: 13.4 10*3/uL — ABNORMAL HIGH (ref 3.4–10.8)

## 2020-04-14 LAB — BASIC METABOLIC PANEL
BUN/Creatinine Ratio: 21 (ref 9–23)
BUN: 20 mg/dL (ref 6–24)
CO2: 27 mmol/L (ref 20–29)
Calcium: 9.4 mg/dL (ref 8.7–10.2)
Chloride: 103 mmol/L (ref 96–106)
Creatinine, Ser: 0.94 mg/dL (ref 0.57–1.00)
GFR calc Af Amer: 87 mL/min/{1.73_m2} (ref >59)
GFR calc non Af Amer: 76 mL/min/{1.73_m2} (ref >59)
Glucose: 98 mg/dL (ref 65–99)
Potassium: 4.6 mmol/L (ref 3.5–5.2)
Sodium: 142 mmol/L (ref 134–144)

## 2020-04-14 LAB — TSH: TSH: 1.44 u[IU]/mL (ref 0.450–4.500)

## 2020-04-14 LAB — PRO B NATRIURETIC PEPTIDE: NT-Pro BNP: 56 pg/mL (ref 0–130)

## 2020-04-14 MED ORDER — FUROSEMIDE 20 MG PO TABS
20.0000 mg | ORAL_TABLET | Freq: Every day | ORAL | 3 refills | Status: DC | PRN
Start: 2020-04-14 — End: 2021-06-11

## 2020-04-14 NOTE — Telephone Encounter (Signed)
-----   Message from [place], [place] sent at 04/14/2020  1:09 PM EDT ----- Fluid marker level, kidney function, hemoglobin, thyroid function and electrolytes are normal. Change lasix to as needed. Keep long of blood pressure.

## 2020-04-19 ENCOUNTER — Ambulatory Visit (INDEPENDENT_AMBULATORY_CARE_PROVIDER_SITE_OTHER): Payer: 59 | Admitting: Family Medicine

## 2020-04-19 ENCOUNTER — Other Ambulatory Visit: Payer: Self-pay

## 2020-04-19 ENCOUNTER — Encounter (INDEPENDENT_AMBULATORY_CARE_PROVIDER_SITE_OTHER): Payer: Self-pay | Admitting: Family Medicine

## 2020-04-19 VITALS — BP 141/82 | HR 72 | Temp 98.2°F | Ht 67.0 in | Wt 337.0 lb

## 2020-04-19 DIAGNOSIS — Z9189 Other specified personal risk factors, not elsewhere classified: Secondary | ICD-10-CM | POA: Diagnosis not present

## 2020-04-19 DIAGNOSIS — E559 Vitamin D deficiency, unspecified: Secondary | ICD-10-CM

## 2020-04-19 DIAGNOSIS — R7303 Prediabetes: Secondary | ICD-10-CM

## 2020-04-19 DIAGNOSIS — Z6841 Body Mass Index (BMI) 40.0 and over, adult: Secondary | ICD-10-CM | POA: Diagnosis not present

## 2020-04-19 MED ORDER — VITAMIN D (ERGOCALCIFEROL) 1.25 MG (50000 UNIT) PO CAPS: 50000.0000 [IU] | ORAL_CAPSULE | ORAL | 0 refills | Status: DC

## 2020-04-19 MED FILL — VIT D2 1.25 MG (50,000 UNIT: 1.25 MG | 56 days supply | Qty: 8 | Fill #0

## 2020-04-20 NOTE — Progress Notes (Signed)
Chief Complaint:   OBESITY Amber Bentley is here to discuss her progress with her obesity treatment plan along with follow-up of her obesity related diagnoses. Amber Bentley is on keeping a food journal and adhering to recommended goals of 1500 calories and 95+ grams of protein and states she is following her eating plan approximately 0% of the time. Amber Bentley states she is walking the dog 20 minutes 7 times per week.  Today's visit was #: 48 Starting weight: 351 lbs Starting date: 12/30/2017 Today's weight: 337 lbs Today's date: 04/20/2020 Total lbs lost to date: 14 lbs Total lbs lost since last in-office visit: 0  Interim History: Amber Bentley has returned to clinic for the first time since 02/09/2020.  She says that life has been busy and she had elevated blood pressure with dyspnea.  She saw Cardiology, who increased her carvedilol to twice daily.  She says she has been trying to make smarter, healthier choices but hasn't followed a plan per se.  Subjective:   1. Vitamin D deficiency Amber Bentley's Vitamin D level was 36.2 on 10/27/2019. She is currently taking prescription vitamin D 50,000 IU each week. She denies nausea, vomiting or muscle weakness.  She endorses fatigue.  2. Prediabetes Amber Bentley has a diagnosis of prediabetes based on her elevated HgA1c and was informed this puts her at greater risk of developing diabetes. She continues to work on diet and exercise to decrease her risk of diabetes. She denies nausea or hypoglycemia.  She is taking metformin 500 mg daily.  Lab Results  Component Value Date   HGBA1C 5.5 10/27/2019   Lab Results  Component Value Date   INSULIN 25.8 (H) 10/27/2019   INSULIN 28.4 (H) 04/14/2019   INSULIN 18.0 07/07/2018   INSULIN 89.7 (H) 12/30/2017   3. At risk for osteoporosis Amber Bentley is at higher risk of osteopenia and osteoporosis due to Vitamin D deficiency.   Assessment/Plan:   1. Vitamin D deficiency Low Vitamin D level contributes to fatigue and are associated with  obesity, breast, and colon cancer. She agrees to continue to take prescription Vitamin D @50 ,000 IU every week and will follow-up for routine testing of Vitamin D, at least 2-3 times per year to avoid over-replacement. - Vitamin D, Ergocalciferol, (DRISDOL) 1.25 MG (50000 UNIT) CAPS capsule; Take 1 capsule (50,000 Units total) by mouth every 7 (seven) days.  Dispense: 8 capsule; Refill: 0  2. Prediabetes Amber Bentley will continue to work on weight loss, exercise, and decreasing simple carbohydrates to help decrease the risk of diabetes.   3. At risk for osteoporosis Amber Bentley was given approximately 15 minutes of osteoporosis prevention counseling today. Amber Bentley is at risk for osteopenia and osteoporosis due to her Vitamin D deficiency. She was encouraged to take her Vitamin D and follow her higher calcium diet and increase strengthening exercise to help strengthen her bones and decrease her risk of osteopenia and osteoporosis.  Repetitive spaced learning was employed today to elicit superior memory formation and behavioral change.  4. Class 3 severe obesity with serious comorbidity and body mass index (BMI) of 50.0 to 59.9 in adult, unspecified obesity type (HCC) Amber Bentley is currently in the action stage of change. As such, her goal is to continue with weight loss efforts. She has agreed to practicing portion control and making smarter food choices, such as increasing vegetables and decreasing simple carbohydrates.   Exercise goals: No exercise has been prescribed at this time.  Behavioral modification strategies: increasing lean protein intake, increasing vegetables and meal planning  and cooking strategies.  Amber Bentley has agreed to follow-up with our clinic in 8 weeks. She was informed of the importance of frequent follow-up visits to maximize her success with intensive lifestyle modifications for her multiple health conditions.   Objective:   Blood pressure (!) 141/82, pulse 72, temperature 98.2 F (36.8 C),  temperature source Oral, height 5\' 7"  (1.702 m), weight (!) 337 lb (152.9 kg), SpO2 95 %. Body mass index is 52.78 kg/m.  General: Cooperative, alert, well developed, in no acute distress. HEENT: Conjunctivae and lids unremarkable. Cardiovascular: Regular rhythm.  Lungs: Normal work of breathing. Neurologic: No focal deficits.   Lab Results  Component Value Date   CREATININE 0.94 04/13/2020   BUN 20 04/13/2020   NA 142 04/13/2020   K 4.6 04/13/2020   CL 103 04/13/2020   CO2 27 04/13/2020   Lab Results  Component Value Date   ALT 16 10/27/2019   AST 13 10/27/2019   ALKPHOS 69 10/27/2019   BILITOT <0.2 10/27/2019   Lab Results  Component Value Date   HGBA1C 5.5 10/27/2019   HGBA1C 5.5 04/14/2019   HGBA1C 5.4 07/07/2018   HGBA1C 6.0 (H) 12/30/2017   HGBA1C 5.8 02/21/2017   Lab Results  Component Value Date   INSULIN 25.8 (H) 10/27/2019   INSULIN 28.4 (H) 04/14/2019   INSULIN 18.0 07/07/2018   INSULIN 89.7 (H) 12/30/2017   Lab Results  Component Value Date   TSH 1.440 04/13/2020   Lab Results  Component Value Date   CHOL 167 10/27/2019   HDL 51 10/27/2019   LDLCALC 104 (H) 10/27/2019   TRIG 64 10/27/2019   CHOLHDL 3.3 04/08/2017   Lab Results  Component Value Date   WBC 13.4 (H) 04/13/2020   HGB 14.7 04/13/2020   HCT 46.9 (H) 04/13/2020   MCV 84 04/13/2020   PLT 208 04/13/2020   Lab Results  Component Value Date   IRON 85 06/20/2017   TIBC 344 06/20/2017   FERRITIN 55.4 06/20/2017   Attestation Statements:   Reviewed by clinician on day of visit: allergies, medications, problem list, medical history, surgical history, family history, social history, and previous encounter notes.  I, 08/21/2017, CMA, am acting as transcriptionist for Insurance claims handler, MD.  I have reviewed the above documentation for accuracy and completeness, and I agree with the above. - Reuben Likes, MD

## 2020-04-22 ENCOUNTER — Ambulatory Visit: Payer: 59 | Admitting: Cardiovascular Disease

## 2020-04-27 ENCOUNTER — Ambulatory Visit (INDEPENDENT_AMBULATORY_CARE_PROVIDER_SITE_OTHER): Payer: 59 | Admitting: Cardiovascular Disease

## 2020-04-27 ENCOUNTER — Other Ambulatory Visit: Payer: Self-pay

## 2020-04-27 ENCOUNTER — Encounter: Payer: Self-pay | Admitting: Cardiovascular Disease

## 2020-04-27 VITALS — BP 148/90 | HR 66 | Ht 67.0 in | Wt 343.0 lb

## 2020-04-27 DIAGNOSIS — I5041 Acute combined systolic (congestive) and diastolic (congestive) heart failure: Secondary | ICD-10-CM | POA: Diagnosis not present

## 2020-04-27 DIAGNOSIS — R0789 Other chest pain: Secondary | ICD-10-CM | POA: Diagnosis not present

## 2020-04-27 DIAGNOSIS — I43 Cardiomyopathy in diseases classified elsewhere: Secondary | ICD-10-CM | POA: Diagnosis not present

## 2020-04-27 DIAGNOSIS — I110 Hypertensive heart disease with heart failure: Secondary | ICD-10-CM

## 2020-04-27 DIAGNOSIS — I272 Pulmonary hypertension, unspecified: Secondary | ICD-10-CM

## 2020-04-27 DIAGNOSIS — R9431 Abnormal electrocardiogram [ECG] [EKG]: Secondary | ICD-10-CM

## 2020-04-27 MED ORDER — METOPROLOL TARTRATE 50 MG PO TABS
ORAL_TABLET | ORAL | 0 refills | Status: DC
Start: 2020-04-27 — End: 2020-05-06

## 2020-04-27 NOTE — Progress Notes (Signed)
**Note Amber Bentley-Identified via Obfuscation** Cardiology Office Note   Date:  04/27/2020   ID:  Amber Bentley, DOB 08-29-1976, MRN [id number]  PCP:  Amber Ng, NP  Cardiologist:   Amber Miss, MD   No chief complaint on file.  Problem list 1. Hypertensive emergency 2. Morbid obesity 3. Pulmonary hypertension-moderate 4. Possible sleep apnea    Amber Bentley is a 44 y.o. female who presents for follow-up of her recent hospitalization for hypertensive emergency.  Amber Bentley has had high blood pressure for several years but has not been on medications.  I saw her in the [facility] emergency. Room. She has been started on medications. She was in the hospital for 4 days  She seems to be tolerating her medications without any problems.  She had an echocardiogram which revealed mildly depressed left ventricle systolic function with an EF of 45-50%. She has grade 2 diastolic dysfunction.  She has moderate pulmonary hypertension with an estimate PA pressure of 59.  Is avoiding salt. 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  Is a Administrator .  Smokes some .    April 08, 2017:  Amber Bentley is seen as a work in visit today for chest pain . Stopped smoking in March .  Has gained some weight .  Wt. Today is 303 .   Has cluster headaches.   Wt Readings from Last 3 Encounters:  04/27/20 (!) 343 lb (155.6 kg)  04/19/20 (!) 337 lb (152.9 kg)  04/13/20 (!) 335 lb 6.4 oz (152.1 kg)    Amber Bentley started having some CP several years ago . Overall had a pretty good weekend - not much in the way of CP. The CP is a heaviness,   Last for hours.   Lasted most of the day yesterday . Seems to related to upper body movement - was not worsened with walking .     Has a significant family hx of CAD   [name], 2018: Amber Bentley is seen  Wt is 333.   Has not been exercising .   Teaches preschool .  Has 2 children - age 27 ( son) and 51 ( daughter)  . Eats poorly,   Knows that she eats the wrong foods.  Blames stress   BP has been stable , no heart failure symptoms  Has cluster headaches.   Developed a MRSA infection on her nose related to her CPAP .   Apr 16, 2018: Amber Bentley is seen today for follow-up of her hypertension and chronic combined systolic and diastolic congestive heart failure.  Her blood pressures is well controlled.  Wt = 311 ( down  from 324 on March, 2019  Has lost 45 lbs total   Has been having some dizziness since she has been losing weight  Lots of orthostatic hypotension   Still has chest pain  - especially if she puls her CPAP off at night   Has stopped smoking   Is exercising .  Walks 4 days a week .   Breakfast - 2 egg omlett with thin slice of cheese on a wrap , fruit  Lunch -  Lean cuisine, yogart, cup of fruit Dinner - 6 oz of lean meat, 2 cups of veggies  [name], 2019:  Has continued to lose weight .  Seen with wife , Amber Bentley.   Wt is 302 now .    Has mild CP  Still  doing well with her diet  Exercising .     Apr 27, 2020:  Amber Bentley is seen today.  She called a week or so ago and had marked hypertension and shortness of breath.  Unfortunately, she is regained weight covid weight .    Her weight today is 343 pounds.  She was very sick several weeks ago.   Thought she had covid. Tested negative twice. Symptoms lingered for 2-3 days .  Did not lose her sense of taste or smell.  We added back  lasix prn.   ,  Increased her coreg.     Was seen by Amber Bentley in late April ECG showed TWI    Past Medical History:  Diagnosis Date  . Anemia   . Anxiety   . Cardiomegaly   . Chest pain   . CHF (congestive heart failure) ([place])   . Constipation   . Depression   . Dyspnea   . Fatigue   . Food allergy    celery  . GERD (gastroesophageal reflux disease)   . Hip pain   . HLD (hyperlipidemia)   . HTN (hypertension)   . Infertility, female   . Lactose intolerance   . Leg edema   . Lower back pain    . OSA on CPAP   . Palpitations   . Prediabetes   . Shoulder pain   . Stomach ulcer   . Varicose vein of leg     Past Surgical History:  Procedure Laterality Date  . ABDOMINAL HYSTERECTOMY    . ENDOMETRIAL ABLATION  2007   prior to hysterectomy     Current Outpatient Medications  Medication Sig Dispense Refill  . carvedilol (COREG) 25 MG tablet Take 1 tablet (25 mg total) by mouth 2 (two) times daily. 180 tablet 3  . furosemide (LASIX) 20 MG tablet Take 1 tablet (20 mg total) by mouth daily as needed for fluid or edema. 90 tablet 3  . hydrALAZINE (APRESOLINE) 25 MG tablet TAKE 1 TABLET BY MOUTH TWICE DAILY 180 tablet 0  . losartan (COZAAR) 100 MG tablet Take 1 tablet (100 mg total) by mouth daily. 90 tablet 3  . metFORMIN (GLUCOPHAGE) 500 MG tablet TAKE 1 TABLET BY MOUTH ONCE DAILY WITH BREAKFAST 30 tablet 0  . pantoprazole (PROTONIX) 40 MG tablet Take 1 tablet (40 mg total) by mouth daily. 30 tablet 0  . rosuvastatin (CRESTOR) 10 MG tablet Take 1 tablet (10 mg total) by mouth daily. 90 tablet 3  . spironolactone (ALDACTONE) 25 MG tablet TAKE 1 TABLET BY MOUTH DAILY 90 tablet 0  . Vitamin D, Ergocalciferol, (DRISDOL) 1.25 MG (50000 UNIT) CAPS capsule Take 1 capsule (50,000 Units total) by mouth every 7 (seven) days. 8 capsule 0  . metoprolol tartrate (LOPRESSOR) 50 MG tablet Take 1 pill 2 hours before your CT 1 tablet 0   No current facility-administered medications for this visit.    No flowsheet data found.    Allergies:   Food and Aleve [naproxen]    Social History:  The patient  reports that she has quit smoking. Her smoking use included cigarettes. She has a 2.50 pack-year smoking history. She has never used smokeless tobacco. She reports current alcohol use. She reports that she does not use drugs.   Family History:  The patient's family history includes Anxiety disorder in her father; Breast cancer in her maternal aunt, maternal grandmother, mother, and paternal  grandmother; Cancer in her maternal aunt, maternal grandmother, mother, and paternal grandfather;  Depression in her father and mother; Diabetes in her father; Heart disease (age of onset: 51) in her father; Hyperlipidemia in her father; Hypertension in her father and mother; Kidney disease in her mother; Obesity in her father and mother; Sleep apnea in her father; Sudden death in her brother; Thyroid disease in her sister.    ROS:    Noted in current history, otherwise review of systems is negative.   Physical Exam: Blood pressure (!) 148/90, pulse 66, height 5\' 7"  (1.702 m), weight (!) 343 lb (155.6 kg), SpO2 97 %.  GEN:  Young female,  Morbidly obese female, NAD  HEENT: Normal NECK: No JVD; No carotid bruits LYMPHATICS: No lymphadenopathy CARDIAC: RRR , no murmurs, rubs, gallops RESPIRATORY:  Clear to auscultation without rales, wheezing or rhonchi  ABDOMEN:  No , obese,  + BS  MUSCULOSKELETAL:  No edema; No deformity  SKIN: Warm and dry NEUROLOGIC:  Alert and oriented x 3     EKG:     Recent Labs: 10/27/2019: ALT 16 04/13/2020: BUN 20; Creatinine, Ser 0.94; Hemoglobin 14.7; NT-Pro BNP 56; Platelets 208; Potassium 4.6; Sodium 142; TSH 1.440    Lipid Panel    Component Value Date/Time   CHOL 167 10/27/2019 0800   TRIG 64 10/27/2019 0800   HDL 51 10/27/2019 0800   CHOLHDL 3.3 04/08/2017 1124   CHOLHDL 4.7 02/22/2017 0420   VLDL 11 02/22/2017 0420   LDLCALC 104 (H) 10/27/2019 0800      Wt Readings from Last 3 Encounters:  04/27/20 (!) 343 lb (155.6 kg)  04/19/20 (!) 337 lb (152.9 kg)  04/13/20 (!) 335 lb 6.4 oz (152.1 kg)      Other studies Reviewed: Additional studies/ records that were reviewed today include: . Review of the above records demonstrates:    ASSESSMENT AND PLAN:  1. Chest pain:   Feleica continues to have intermittent episodes of chest pain and pressure.  She was seen by Zella Ball, PA several weeks ago and had T wave inversions in the  anterolateral leads.  She has a history of heart failure but this improved with medical therapy.  Because of her chest pain and T wave inversions I would like to get a coronary CT angiogram.  We will schedule a lab the next several weeks.  I will see her again in 6 weeks for follow-up visit.  2.   Hypertensive emergency:   Her blood pressures been elevated once again.  She admits to coming off of her diet.  She is gained about 35 pounds since I last saw her a couple of years ago.  I strongly encouraged her to continue with the diet and exercise and weight loss program.  3.  Chronic combined systolic and diastolic congestive heart failure:   3.  Pulmonary hypertension:  -     4. Morbid obesity:     Encouraged her to lose weight.  Current medicines are reviewed at length with the patient today.  The patient does not have concerns regarding medicines.  Labs/ tests ordered today include:   Orders Placed This Encounter  Procedures  . CT CORONARY MORPH W/CTA COR W/SCORE W/CA W/CM &/OR WO/CM  . CT CORONARY FRACTIONAL FLOW RESERVE DATA PREP  . CT CORONARY FRACTIONAL FLOW RESERVE FLUID ANALYSIS  . Basic Metabolic Panel (BMET)    Disposition:   FU with me in 6 weeks .     Chelsea Aus, MD  04/27/2020 12:33 PM    [facility]  [address] [place], [place]  [address] Phone: [phone]; Fax: [phone]

## 2020-04-27 NOTE — Patient Instructions (Addendum)
Medication Instructions:  Your physician recommends that you continue on your current medications as directed. Please refer to the Current Medication list given to you today.  *If you need a refill on your cardiac medications before your next appointment, please call your pharmacy*   Lab Work: Your physician recommends that you return for lab work in: 1 week before your CT  If you have labs (blood work) drawn today and your tests are completely normal, you will receive your results only by: [name] MyChart Message (if you have MyChart) OR . A paper copy in the mail If you have any lab test that is abnormal or we need to change your treatment, we will call you to review the results.   Testing/Procedures: Your cardiac CT will be scheduled at one of the below locations:   [facility] ([facility] [address] [place], [place] [address] [phone]  [place] [address] [place], [place] [address] [phone]  If scheduled at [facility], please arrive at the [facility] main entrance of [facility] 30 minutes prior to test start time. Proceed to the [facility] Radiology Department (first floor) to check-in and test prep.  If scheduled at [facility], please arrive 15 mins early for check-in and test prep.  Please follow these instructions carefully (unless otherwise directed):  Hold all erectile dysfunction medications at least 3 days (72 hrs) prior to test.  On the Night Before the Test: . Be sure to Drink plenty of water. . Do not consume any caffeinated/decaffeinated beverages or chocolate 12 hours prior to your test. . Do not take any antihistamines 12 hours prior to your test. . If you take Metformin do not take 24 hours prior to test.  On the Day of the Test: . Drink plenty of water. Do not drink any water within one hour of the test. . Do not eat any food 4 hours prior to  the test. . You may take your regular medications prior to the test.  . Take metoprolol (Lopressor) two hours prior to test. . HOLD Furosemide/Hydrochlorothiazide morning of the test. . FEMALES- please wear underwire-free bra if available        After the Test: . Drink plenty of water. . After receiving IV contrast, you may experience a mild flushed feeling. This is normal. . On occasion, you may experience a mild rash up to 24 hours after the test. This is not dangerous. If this occurs, you can take Benadryl 25 mg and increase your fluid intake. . If you experience trouble breathing, this can be serious. If it is severe call 911 IMMEDIATELY. If it is mild, please call our office. . If you take any of these medications: Glipizide/Metformin, Avandament, Glucavance, please do not take 48 hours after completing test unless otherwise instructed.   Once we have confirmed authorization from your insurance company, we will call you to set up a date and time for your test.   For non-scheduling related questions, please contact the cardiac imaging nurse navigator should you have any questions/concerns: Marchia Bond, RN Navigator Cardiac Imaging [facility] [phone] office  For scheduling needs, including cancellations and rescheduling, please call [phone].     Follow-Up: At [facility], you and your health needs are our priority.  As part of our continuing mission to provide you with exceptional heart care, we have created designated Provider Care Teams.  These Care  Teams include your primary Cardiologist (physician) and Advanced Practice Providers (APPs -  Physician Assistants and Nurse Practitioners) who all work together to provide you with the care you need, when you need it.   Your next appointment:   6 week(s) on Friday July 2  The format for your next appointment:   In Person  Provider:   You may see Kristeen Miss, MD or one of the following  Advanced Practice Providers on your designated Care Team:    Tereso Newcomer, PA-C  [place], [place]    Other Instructions  For your  leg edema you  should do  the following 1. Leg elevation - I recommend the Lounge Dr. Leg rest.  See below for details  2. Salt restriction  -  Use potassium chloride instead of regular salt as a salt substitute. 3. Walk regularly 4. Compression hose - [facility] [employer] [facility] 5. Weight loss    Available on [email or web address] Or  Go to [email or web address]

## 2020-04-28 DIAGNOSIS — I5032 Chronic diastolic (congestive) heart failure: Secondary | ICD-10-CM | POA: Diagnosis not present

## 2020-04-28 DIAGNOSIS — I159 Secondary hypertension, unspecified: Secondary | ICD-10-CM | POA: Diagnosis not present

## 2020-04-28 DIAGNOSIS — I5041 Acute combined systolic (congestive) and diastolic (congestive) heart failure: Secondary | ICD-10-CM | POA: Diagnosis not present

## 2020-05-05 ENCOUNTER — Encounter (INDEPENDENT_AMBULATORY_CARE_PROVIDER_SITE_OTHER): Payer: Self-pay | Admitting: Family Medicine

## 2020-05-06 ENCOUNTER — Telehealth (HOSPITAL_COMMUNITY): Payer: Self-pay | Admitting: *Deleted

## 2020-05-06 ENCOUNTER — Other Ambulatory Visit (HOSPITAL_COMMUNITY): Payer: Self-pay | Admitting: *Deleted

## 2020-05-06 MED ORDER — METOPROLOL TARTRATE 50 MG PO TABS: ORAL_TABLET | ORAL | 0 refills | Status: DC

## 2020-05-06 MED FILL — METOPROLOL TARTRATE 50 MG T: 50 | 1 days supply | Qty: 1 | Fill #0

## 2020-05-06 NOTE — Telephone Encounter (Signed)
Reaching out to patient to offer assistance regarding upcoming cardiac imaging study; pt verbalizes understanding of appt date/time, parking situation and where to check in, pre-test NPO status and medications ordered, and verified current allergies; name and call back number provided for further questions should they arise  Merle Tai RN Navigator Cardiac Imaging [facility] [phone] office [phone] cell 

## 2020-05-09 ENCOUNTER — Other Ambulatory Visit: Payer: Self-pay

## 2020-05-09 ENCOUNTER — Ambulatory Visit (HOSPITAL_COMMUNITY)
Admission: RE | Admit: 2020-05-09 | Discharge: 2020-05-09 | Disposition: A | Discharge status: Home / Self Care | Payer: 59 | Source: Ambulatory Visit | Attending: Cardiovascular Disease | Admitting: Cardiovascular Disease

## 2020-05-09 ENCOUNTER — Encounter (HOSPITAL_COMMUNITY): Payer: Self-pay

## 2020-05-09 DIAGNOSIS — Z006 Encounter for examination for normal comparison and control in clinical research program: Secondary | ICD-10-CM

## 2020-05-09 DIAGNOSIS — R0789 Other chest pain: Secondary | ICD-10-CM

## 2020-05-09 NOTE — Research (Signed)
Cadfem Informed Consent    Patient Name: Amber Bentley   Subject met inclusion and exclusion criteria.  The informed consent form, study requirements and expectations were reviewed with the subject and questions and concerns were addressed prior to the signing of the consent form.  The subject verbalized understanding of the trail requirements.  The subject agreed to participate in the CADFEM trial and signed the informed consent.  The informed consent was obtained prior to performance of any protocol-specific procedures for the subject.  A copy of the signed informed consent was given to the subject and a copy was placed in the subject's medical record.   Neva Seat

## 2020-05-09 NOTE — Progress Notes (Signed)
Pt states she does not want to take nitro.  Message sent to Ct navigator. Test canceled

## 2020-05-10 ENCOUNTER — Other Ambulatory Visit (INDEPENDENT_AMBULATORY_CARE_PROVIDER_SITE_OTHER): Payer: Self-pay

## 2020-05-10 ENCOUNTER — Encounter (INDEPENDENT_AMBULATORY_CARE_PROVIDER_SITE_OTHER): Payer: Self-pay

## 2020-05-10 DIAGNOSIS — R7303 Prediabetes: Secondary | ICD-10-CM

## 2020-05-10 MED ORDER — METFORMIN HCL 500 MG PO TABS: ORAL_TABLET | ORAL | 0 refills | Status: DC

## 2020-05-10 MED FILL — METFORMIN HCL 500 MG TABS: 500 | 30 days supply | Qty: 30 | Fill #0

## 2020-05-10 NOTE — Telephone Encounter (Signed)
Please advise 

## 2020-05-12 MED ORDER — CARVEDILOL 25 MG PO TABS: 25.0000 mg | ORAL_TABLET | Freq: Two times a day (BID) | ORAL | 3 refills | Status: DC

## 2020-05-29 DIAGNOSIS — I159 Secondary hypertension, unspecified: Secondary | ICD-10-CM | POA: Diagnosis not present

## 2020-05-29 DIAGNOSIS — I5041 Acute combined systolic (congestive) and diastolic (congestive) heart failure: Secondary | ICD-10-CM | POA: Diagnosis not present

## 2020-05-29 DIAGNOSIS — I5032 Chronic diastolic (congestive) heart failure: Secondary | ICD-10-CM | POA: Diagnosis not present

## 2020-06-16 ENCOUNTER — Encounter (INDEPENDENT_AMBULATORY_CARE_PROVIDER_SITE_OTHER): Payer: Self-pay

## 2020-06-16 NOTE — Progress Notes (Signed)
Cardiology Office Note   Date:  06/16/2020   ID:  Amber Bentley, DOB [birth year], MRN [id number]  PCP:  Anne Ng, NP  Cardiologist:   Kristeen Miss, MD   Chief Complaint  Patient presents with  . Congestive Heart Failure  . Hypertension   Problem list 1. Hypertensive emergency 2. Morbid obesity 3. Pulmonary hypertension-moderate 4. Possible sleep apnea    Amber Bentley is a 44 y.o. female who presents for follow-up of her recent hospitalization for hypertensive emergency.  Amber Bentley has had high blood pressure for several years but has not been on medications.  I saw her in the [facility] emergency. Room. She has been started on medications. She was in the hospital for 4 days  She seems to be tolerating her medications without any problems.  She had an echocardiogram which revealed mildly depressed left ventricle systolic function with an EF of 45-50%. She has grade 2 diastolic dysfunction.  She has moderate pulmonary hypertension with an estimate PA pressure of 59.  Is avoiding salt. 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  Is a Administrator .  Smokes some .    April 08, 2017:  Amber Bentley is seen as a work in visit today for chest pain . Stopped smoking in March .  Has gained some weight .  Wt. Today is 303 .   Has cluster headaches.   Wt Readings from Last 3 Encounters:  04/27/20 (!) 343 lb (155.6 kg)  04/19/20 (!) 337 lb (152.9 kg)  04/13/20 (!) 335 lb 6.4 oz (152.1 kg)    Amber Bentley started having some CP several years ago . Overall had a pretty good weekend - not much in the way of CP. The CP is a heaviness,   Last for hours.   Lasted most of the day yesterday . Seems to related to upper body movement - was not worsened with walking .     Has a significant family hx of CAD   [name], 2018: Amber Bentley is seen  Wt is 333.   Has not been exercising .   Teaches preschool  .  Has 2 children - age 41 ( son) and 56 ( daughter) . Eats poorly,   Knows that she eats the wrong foods.  Blames stress   BP has been stable , no heart failure symptoms  Has cluster headaches.   Developed a MRSA infection on her nose related to her CPAP .   Apr 16, 2018: Amber Bentley is seen today for follow-up of her hypertension and chronic combined systolic and diastolic congestive heart failure.  Her blood pressures is well controlled.  Wt = 311 ( down  from 324 on March, 2019  Has lost 45 lbs total   Has been having some dizziness since she has been losing weight  Lots of orthostatic hypotension   Still has chest pain  - especially if she puls her CPAP off at night   Has stopped smoking   Is exercising .  Walks 4 days a week .   Breakfast - 2 egg omlett with thin slice of cheese on a wrap , fruit  Lunch -  Lean cuisine, yogart, cup of fruit Dinner - 6 oz of lean meat, 2 cups of veggies  [name], 2019:  Has continued to lose weight .  Seen with wife , Amber Bentley.   Wt is 302  now .    Has mild CP  Still doing well with her diet  Exercising .     Apr 27, 2020:  Amber Bentley is seen today.  She called a week or so ago and had marked hypertension and shortness of breath.  Unfortunately, she is regained weight covid weight .    Her weight today is 343 pounds.  She was very sick several weeks ago.   Thought she had covid. Tested negative twice. Symptoms lingered for 2-3 days .  Did not lose her sense of taste or smell.  We added back  lasix prn.   ,  Increased her coreg.     Was seen by Vin in late April ECG showed TWI   June 17, 2020:  Amber Bentley  is seen today for follow-up visit.  She has a history of congestive heart failure, hypertension, hyperlipidemia, obesity.  She gained a lot of weight since I last saw her. Wt today is 340 lbs. ( down 3 lbs)  She is an emotional eater.  Eats sweats after work.    Past Medical History:  Diagnosis Date  . Anemia   . Anxiety   . Cardiomegaly    . Chest pain   . CHF (congestive heart failure) ([facility])   . Constipation   . Depression   . Dyspnea   . Fatigue   . Food allergy    celery  . GERD (gastroesophageal reflux disease)   . Hip pain   . HLD (hyperlipidemia)   . HTN (hypertension)   . Infertility, female   . Lactose intolerance   . Leg edema   . Lower back pain   . OSA on CPAP   . Palpitations   . Prediabetes   . Shoulder pain   . Stomach ulcer   . Varicose vein of leg     Past Surgical History:  Procedure Laterality Date  . ABDOMINAL HYSTERECTOMY    . ENDOMETRIAL ABLATION  2007   prior to hysterectomy     Current Outpatient Medications  Medication Sig Dispense Refill  . carvedilol (COREG) 25 MG tablet Take 1 tablet (25 mg total) by mouth 2 (two) times daily. 180 tablet 3  . furosemide (LASIX) 20 MG tablet Take 1 tablet (20 mg total) by mouth daily as needed for fluid or edema. 90 tablet 3  . hydrALAZINE (APRESOLINE) 25 MG tablet TAKE 1 TABLET BY MOUTH TWICE DAILY 180 tablet 0  . losartan (COZAAR) 100 MG tablet Take 1 tablet (100 mg total) by mouth daily. 90 tablet 3  . metFORMIN (GLUCOPHAGE) 500 MG tablet TAKE 1 TABLET BY MOUTH ONCE DAILY WITH BREAKFAST 30 tablet 0  . metoprolol tartrate (LOPRESSOR) 50 MG tablet Take 1 pill 2 hours before your CT 1 tablet 0  . pantoprazole (PROTONIX) 40 MG tablet Take 1 tablet (40 mg total) by mouth daily. 30 tablet 0  . rosuvastatin (CRESTOR) 10 MG tablet Take 1 tablet (10 mg total) by mouth daily. 90 tablet 3  . spironolactone (ALDACTONE) 25 MG tablet TAKE 1 TABLET BY MOUTH DAILY 90 tablet 0  . Vitamin D, Ergocalciferol, (DRISDOL) 1.25 MG (50000 UNIT) CAPS capsule Take 1 capsule (50,000 Units total) by mouth every 7 (seven) days. 8 capsule 0   No current facility-administered medications for this visit.    No flowsheet data found.    Allergies:   Food and Aleve [naproxen]    Social History:  The patient  reports that she has quit smoking. Her smoking  use included  cigarettes. She has a 2.50 pack-year smoking history. She has never used smokeless tobacco. She reports current alcohol use. She reports that she does not use drugs.   Family History:  The patient's family history includes Anxiety disorder in her father; Breast cancer in her maternal aunt, maternal grandmother, mother, and paternal grandmother; Cancer in her maternal aunt, maternal grandmother, mother, and paternal grandfather; Depression in her father and mother; Diabetes in her father; Heart disease (age of onset: 51) in her father; Hyperlipidemia in her father; Hypertension in her father and mother; Kidney disease in her mother; Obesity in her father and mother; Sleep apnea in her father; Sudden death in her brother; Thyroid disease in her sister.    ROS:    Noted in current history, otherwise review of systems is negative.  Physical Exam: There were no vitals taken for this visit.  GEN:  Young, obese female, NAD   HEENT: Normal NECK: No JVD; No carotid bruits LYMPHATICS: No lymphadenopathy CARDIAC: RRR , no murmurs, rubs, gallops RESPIRATORY:  Clear to auscultation without rales, wheezing or rhonchi  ABDOMEN: Soft, non-tender, non-distended MUSCULOSKELETAL:  No edema; No deformity  SKIN: Warm and dry NEUROLOGIC:  Alert and oriented x 3     EKG:     Recent Labs: 10/27/2019: ALT 16 04/13/2020: BUN 20; Creatinine, Ser 0.94; Hemoglobin 14.7; NT-Pro BNP 56; Platelets 208; Potassium 4.6; Sodium 142; TSH 1.440    Lipid Panel    Component Value Date/Time   CHOL 167 10/27/2019 0800   TRIG 64 10/27/2019 0800   HDL 51 10/27/2019 0800   CHOLHDL 3.3 04/08/2017 1124   CHOLHDL 4.7 02/22/2017 0420   VLDL 11 02/22/2017 0420   LDLCALC 104 (H) 10/27/2019 0800      Wt Readings from Last 3 Encounters:  04/27/20 (!) 343 lb (155.6 kg)  04/19/20 (!) 337 lb (152.9 kg)  04/13/20 (!) 335 lb 6.4 oz (152.1 kg)      Other studies Reviewed: Additional studies/ records that were reviewed  today include: . Review of the above records demonstrates:    ASSESSMENT AND PLAN:  1. Chest pain:   No futher episodes of cp   2.   Hypertensive emergency:  BP is better,  Still a bit elevated but she admits tht she does not eat right for the most part .  Have encouraged better diet, more exercise    3.  Chronic combined systolic and diastolic congestive heart failure: better. Cont meds.   3.  Pulmonary hypertension:  -     4. Morbid obesity:     She has lots of anxiety and stress eats.   I 've referred her to [facility] for counseling.    Current medicines are reviewed at length with the patient today.  The patient does not have concerns regarding medicines.  Labs/ tests ordered today include:   No orders of the defined types were placed in this encounter.   Disposition:   FU with me in 3 months    Kristeen Miss, MD  06/16/2020 1:08 PM    [facility] [address] [place], [place], [place]  [address] Phone: [phone]; Fax: [phone]

## 2020-06-17 ENCOUNTER — Other Ambulatory Visit: Payer: Self-pay

## 2020-06-17 ENCOUNTER — Encounter: Payer: Self-pay | Admitting: Cardiovascular Disease

## 2020-06-17 ENCOUNTER — Ambulatory Visit (INDEPENDENT_AMBULATORY_CARE_PROVIDER_SITE_OTHER): Payer: 59 | Admitting: Cardiovascular Disease

## 2020-06-17 VITALS — BP 148/100 | HR 66 | Ht 67.0 in | Wt 340.6 lb

## 2020-06-17 DIAGNOSIS — I43 Cardiomyopathy in diseases classified elsewhere: Secondary | ICD-10-CM

## 2020-06-17 DIAGNOSIS — F329 Major depressive disorder, single episode, unspecified: Secondary | ICD-10-CM | POA: Diagnosis not present

## 2020-06-17 DIAGNOSIS — I5032 Chronic diastolic (congestive) heart failure: Secondary | ICD-10-CM | POA: Diagnosis not present

## 2020-06-17 DIAGNOSIS — I110 Hypertensive heart disease with heart failure: Secondary | ICD-10-CM

## 2020-06-17 DIAGNOSIS — F32A Depression, unspecified: Secondary | ICD-10-CM

## 2020-06-17 DIAGNOSIS — F419 Anxiety disorder, unspecified: Secondary | ICD-10-CM | POA: Diagnosis not present

## 2020-06-17 NOTE — Patient Instructions (Addendum)
Medication Instructions:  Your physician recommends that you continue on your current medications as directed. Please refer to the Current Medication list given to you today.  *If you need a refill on your cardiac medications before your next appointment, please call your pharmacy*   Lab Work: None Ordered If you have labs (blood work) drawn today and your tests are completely normal, you will receive your results only by:  MyChart Message (if you have MyChart) OR  A paper copy in the mail If you have any lab test that is abnormal or we need to change your treatment, we will call you to review the results.   Testing/Procedures: None Ordered   Follow-Up: At [facility], you and your health needs are our priority.  As part of our continuing mission to provide you with exceptional heart care, we have created designated Provider Care Teams.  These Care Teams include your primary Cardiologist (physician) and Advanced Practice Providers (APPs -  Physician Assistants and Nurse Practitioners) who all work together to provide you with the care you need, when you need it.   Your next appointment:   3 month(s) on Tuesday Oct. 5 at 4:00 pm  The format for your next appointment:   In Person  Provider:   Kristeen Miss, MD

## 2020-06-21 ENCOUNTER — Other Ambulatory Visit (INDEPENDENT_AMBULATORY_CARE_PROVIDER_SITE_OTHER): Payer: Self-pay | Admitting: Family Medicine

## 2020-06-21 ENCOUNTER — Ambulatory Visit (INDEPENDENT_AMBULATORY_CARE_PROVIDER_SITE_OTHER): Payer: 59 | Admitting: Family Medicine

## 2020-06-21 ENCOUNTER — Other Ambulatory Visit: Payer: Self-pay

## 2020-06-21 DIAGNOSIS — R7303 Prediabetes: Secondary | ICD-10-CM

## 2020-06-21 DIAGNOSIS — K219 Gastro-esophageal reflux disease without esophagitis: Secondary | ICD-10-CM

## 2020-06-21 MED ORDER — METFORMIN HCL 500 MG PO TABS: ORAL_TABLET | ORAL | 0 refills | Status: DC

## 2020-06-21 MED FILL — METFORMIN HCL 500 MG TABS: 500 | 14 days supply | Qty: 14 | Fill #0

## 2020-06-27 ENCOUNTER — Other Ambulatory Visit: Payer: Self-pay | Admitting: Cardiology

## 2020-06-28 ENCOUNTER — Other Ambulatory Visit (INDEPENDENT_AMBULATORY_CARE_PROVIDER_SITE_OTHER): Payer: Self-pay | Admitting: Family Medicine

## 2020-06-28 ENCOUNTER — Other Ambulatory Visit: Payer: Self-pay

## 2020-06-28 ENCOUNTER — Encounter (INDEPENDENT_AMBULATORY_CARE_PROVIDER_SITE_OTHER): Payer: Self-pay | Admitting: Family Medicine

## 2020-06-28 ENCOUNTER — Ambulatory Visit (INDEPENDENT_AMBULATORY_CARE_PROVIDER_SITE_OTHER): Payer: 59 | Admitting: Family Medicine

## 2020-06-28 VITALS — BP 171/93 | HR 77 | Temp 98.0°F | Ht 67.0 in | Wt 338.0 lb

## 2020-06-28 DIAGNOSIS — R7303 Prediabetes: Secondary | ICD-10-CM | POA: Diagnosis not present

## 2020-06-28 DIAGNOSIS — Z6841 Body Mass Index (BMI) 40.0 and over, adult: Secondary | ICD-10-CM

## 2020-06-28 DIAGNOSIS — E7849 Other hyperlipidemia: Secondary | ICD-10-CM

## 2020-06-28 DIAGNOSIS — F3289 Other specified depressive episodes: Secondary | ICD-10-CM

## 2020-06-28 DIAGNOSIS — Z9189 Other specified personal risk factors, not elsewhere classified: Secondary | ICD-10-CM

## 2020-06-28 DIAGNOSIS — I159 Secondary hypertension, unspecified: Secondary | ICD-10-CM | POA: Diagnosis not present

## 2020-06-28 DIAGNOSIS — I5032 Chronic diastolic (congestive) heart failure: Secondary | ICD-10-CM | POA: Diagnosis not present

## 2020-06-28 DIAGNOSIS — I5041 Acute combined systolic (congestive) and diastolic (congestive) heart failure: Secondary | ICD-10-CM | POA: Diagnosis not present

## 2020-06-28 MED ORDER — METFORMIN HCL 500 MG PO TABS: ORAL_TABLET | ORAL | 2 refills | Status: DC

## 2020-06-28 MED ORDER — ROSUVASTATIN CALCIUM 10 MG PO TABS: 10.0000 mg | ORAL_TABLET | Freq: Every day | ORAL | 0 refills | Status: DC

## 2020-06-28 MED FILL — ROSUVASTATIN CALCIUM 10 MG: 10 | 90 days supply | Qty: 90 | Fill #0

## 2020-07-02 MED FILL — METFORMIN HCL 500 MG TABS: 500 | 30 days supply | Qty: 30 | Fill #0

## 2020-07-05 NOTE — Progress Notes (Signed)
Chief Complaint:   OBESITY Amber Bentley is here to discuss her progress with her obesity treatment plan along with follow-up of her obesity related diagnoses. Amber Bentley is on practicing portion control and making smarter food choices, such as increasing vegetables and decreasing simple carbohydrates and states she is following her eating plan approximately 0% of the time. Amber Bentley states she is doing 0 minutes 0 times per week.  Today's visit was #: 45 Starting weight: 351 lbs Starting date: 12/30/2017 Today's weight: 338 lbs Today's date: 06/28/2020 Total lbs lost to date: 13 Total lbs lost since last in-office visit: 0  Interim History: Amber Bentley has returned to our clinic since early May. Family is currently vacationing in [place] but she has stayed home. She has been obsessively cleaning her house while her family is away.  Subjective:   1. Other hyperlipidemia Amber Bentley is on Crestor, and she denies myalgias.  2. Pre-diabetes Amber Bentley's last A1c was 5.5 and insulin 25.8. She denies GI side effects.  3. Other depression, with emotional eating Amber Bentley denies suicidal ideas or homicidal ideas. She was previously on Trintellix with some improvement, but previously on SSRIs with sexual side effects.  4. At risk for diabetes mellitus Amber Bentley is at higher than average risk for developing diabetes due to her obesity.   Assessment/Plan:   1. Other hyperlipidemia Cardiovascular risk and specific lipid/LDL goals reviewed. We discussed several lifestyle modifications today and Arianis will continue to work on diet, exercise and weight loss efforts. We will refill Crestor for 90 days with no refills. Orders and follow up as documented in patient record.   Counseling Intensive lifestyle modifications are the first line treatment for this issue. . Dietary changes: Increase soluble fiber. Decrease simple carbohydrates. . Exercise changes: Moderate to vigorous-intensity aerobic activity 150 minutes per week if  tolerated. . Lipid-lowering medications: see documented in medical record.  - rosuvastatin (CRESTOR) 10 MG tablet; Take 1 tablet (10 mg total) by mouth daily.  Dispense: 90 tablet; Refill: 0  2. Pre-diabetes Leilynn will continue to work on weight loss, exercise, and decreasing simple carbohydrates to help decrease the risk of diabetes. We will refill metformin for 1 month.  - metFORMIN (GLUCOPHAGE) 500 MG tablet; TAKE 1 TABLET BY MOUTH ONCE DAILY WITH BREAKFAST  Dispense: 30 tablet; Refill: 2  3. Other depression, with emotional eating Behavior modification techniques were discussed today to help Carri deal with her emotional/non-hunger eating behaviors. We will follow up at her next appointment. Orders and follow up as documented in patient record.   4. At risk for diabetes mellitus Amber Bentley was given approximately 15 minutes of diabetes education and counseling today. We discussed intensive lifestyle modifications today with an emphasis on weight loss as well as increasing exercise and decreasing simple carbohydrates in her diet. We also reviewed medication options with an emphasis on risk versus benefit of those discussed.   Repetitive spaced learning was employed today to elicit superior memory formation and behavioral change.  5. Class 3 severe obesity with serious comorbidity and body mass index (BMI) of 50.0 to 59.9 in adult, unspecified obesity type (HCC) Amber Bentley is currently in the action stage of change. As such, her goal is to continue with weight loss efforts. She has agreed to practicing portion control and making smarter food choices, such as increasing vegetables and decreasing simple carbohydrates.   Exercise goals: No exercise has been prescribed at this time.  Behavioral modification strategies: increasing lean protein intake, meal planning and cooking strategies, keeping  healthy foods in the home and planning for success.  Amber Bentley has agreed to follow-up with our clinic as needed.  She was informed of the importance of frequent follow-up visits to maximize her success with intensive lifestyle modifications for her multiple health conditions.   Objective:   Blood pressure (!) 171/93, pulse 77, temperature 98 F (36.7 C), temperature source Oral, height 5\' 7"  (1.702 m), weight (!) 338 lb (153.3 kg), SpO2 96 %. Body mass index is 52.94 kg/m.  General: Cooperative, alert, well developed, in no acute distress. HEENT: Conjunctivae and lids unremarkable. Cardiovascular: Regular rhythm.  Lungs: Normal work of breathing. Neurologic: No focal deficits.   Lab Results  Component Value Date   CREATININE 0.94 04/13/2020   BUN 20 04/13/2020   NA 142 04/13/2020   K 4.6 04/13/2020   CL 103 04/13/2020   CO2 27 04/13/2020   Lab Results  Component Value Date   ALT 16 10/27/2019   AST 13 10/27/2019   ALKPHOS 69 10/27/2019   BILITOT <0.2 10/27/2019   Lab Results  Component Value Date   HGBA1C 5.5 10/27/2019   HGBA1C 5.5 04/14/2019   HGBA1C 5.4 07/07/2018   HGBA1C 6.0 (H) 12/30/2017   HGBA1C 5.8 02/21/2017   Lab Results  Component Value Date   INSULIN 25.8 (H) 10/27/2019   INSULIN 28.4 (H) 04/14/2019   INSULIN 18.0 07/07/2018   INSULIN 89.7 (H) 12/30/2017   Lab Results  Component Value Date   TSH 1.440 04/13/2020   Lab Results  Component Value Date   CHOL 167 10/27/2019   HDL 51 10/27/2019   LDLCALC 104 (H) 10/27/2019   TRIG 64 10/27/2019   CHOLHDL 3.3 04/08/2017   Lab Results  Component Value Date   WBC 13.4 (H) 04/13/2020   HGB 14.7 04/13/2020   HCT 46.9 (H) 04/13/2020   MCV 84 04/13/2020   PLT 208 04/13/2020   Lab Results  Component Value Date   IRON 85 06/20/2017   TIBC 344 06/20/2017   FERRITIN 55.4 06/20/2017   Attestation Statements:   Reviewed by clinician on day of visit: allergies, medications, problem list, medical history, surgical history, family history, social history, and previous encounter notes.   I, 08/21/2017, am  acting as transcriptionist for Burt Knack, MD.  I have reviewed the above documentation for accuracy and completeness, and I agree with the above. - Reuben Likes, MD

## 2020-07-11 ENCOUNTER — Encounter: Payer: Self-pay | Admitting: Nurse Practitioner

## 2020-07-11 DIAGNOSIS — R110 Nausea: Secondary | ICD-10-CM

## 2020-07-12 ENCOUNTER — Other Ambulatory Visit (INDEPENDENT_AMBULATORY_CARE_PROVIDER_SITE_OTHER): Payer: Self-pay | Admitting: Family Medicine

## 2020-07-12 DIAGNOSIS — K219 Gastro-esophageal reflux disease without esophagitis: Secondary | ICD-10-CM

## 2020-07-12 MED ORDER — ONDANSETRON HCL 4 MG PO TABS: 4.0000 mg | ORAL_TABLET | Freq: Three times a day (TID) | ORAL | 0 refills | Status: DC | PRN

## 2020-07-12 MED FILL — ONDANSETRON HCL 4 MG TABS: 4 | 6 days supply | Qty: 20 | Fill #0

## 2020-07-14 ENCOUNTER — Encounter (INDEPENDENT_AMBULATORY_CARE_PROVIDER_SITE_OTHER): Payer: Self-pay | Admitting: Family Medicine

## 2020-07-18 ENCOUNTER — Other Ambulatory Visit (INDEPENDENT_AMBULATORY_CARE_PROVIDER_SITE_OTHER): Payer: Self-pay

## 2020-07-18 DIAGNOSIS — K219 Gastro-esophageal reflux disease without esophagitis: Secondary | ICD-10-CM

## 2020-07-18 MED ORDER — PANTOPRAZOLE SODIUM 40 MG PO TBEC: 40.0000 mg | DELAYED_RELEASE_TABLET | Freq: Every day | ORAL | 0 refills | Status: DC

## 2020-07-18 MED FILL — PANTOPRAZOLE SOD DR 40 MG T: 40 | 30 days supply | Qty: 30 | Fill #0

## 2020-07-19 ENCOUNTER — Other Ambulatory Visit: Payer: Self-pay | Admitting: Cardiovascular Disease

## 2020-07-19 DIAGNOSIS — I10 Essential (primary) hypertension: Secondary | ICD-10-CM

## 2020-07-19 MED FILL — SPIRONOLACTONE 25 MG TABS: 25 | 90 days supply | Qty: 90 | Fill #0

## 2020-07-29 DIAGNOSIS — I159 Secondary hypertension, unspecified: Secondary | ICD-10-CM | POA: Diagnosis not present

## 2020-07-29 DIAGNOSIS — I5041 Acute combined systolic (congestive) and diastolic (congestive) heart failure: Secondary | ICD-10-CM | POA: Diagnosis not present

## 2020-07-29 DIAGNOSIS — I5032 Chronic diastolic (congestive) heart failure: Secondary | ICD-10-CM | POA: Diagnosis not present

## 2020-08-03 DIAGNOSIS — U071 COVID-19: Secondary | ICD-10-CM | POA: Diagnosis not present

## 2020-08-03 MED FILL — SYMBICORT 160-4.5 MCG INH: 160-4.5 | 30 days supply | Qty: 10 | Fill #0

## 2020-08-03 MED FILL — AZITHROMYCIN 250 MG TABLET: 250 | 5 days supply | Qty: 6 | Fill #0

## 2020-08-06 ENCOUNTER — Encounter: Payer: Self-pay | Admitting: Nurse Practitioner

## 2020-08-07 ENCOUNTER — Ambulatory Visit (HOSPITAL_COMMUNITY)
Admission: RE | Admit: 2020-08-07 | Discharge: 2020-08-07 | Disposition: A | Discharge status: Home / Self Care | Payer: 59 | Source: Ambulatory Visit | Attending: Pulmonary Disease | Admitting: Pulmonary Disease

## 2020-08-07 ENCOUNTER — Other Ambulatory Visit: Payer: Self-pay | Admitting: Nurse Practitioner

## 2020-08-07 ENCOUNTER — Telehealth: Payer: Self-pay | Admitting: Nurse Practitioner

## 2020-08-07 DIAGNOSIS — I10 Essential (primary) hypertension: Secondary | ICD-10-CM

## 2020-08-07 DIAGNOSIS — I5032 Chronic diastolic (congestive) heart failure: Secondary | ICD-10-CM

## 2020-08-07 DIAGNOSIS — Z6841 Body Mass Index (BMI) 40.0 and over, adult: Secondary | ICD-10-CM | POA: Diagnosis not present

## 2020-08-07 DIAGNOSIS — I110 Hypertensive heart disease with heart failure: Secondary | ICD-10-CM | POA: Insufficient documentation

## 2020-08-07 DIAGNOSIS — U071 COVID-19: Secondary | ICD-10-CM

## 2020-08-07 MED ORDER — ALBUTEROL SULFATE HFA 108 (90 BASE) MCG/ACT IN AERS: 2.0000 | INHALATION_SPRAY | Freq: Once | RESPIRATORY_TRACT | Status: DC | PRN

## 2020-08-07 MED ORDER — DIPHENHYDRAMINE HCL 50 MG/ML IJ SOLN: 50.0000 mg | Freq: Once | INTRAMUSCULAR | Status: DC | PRN

## 2020-08-07 MED ORDER — FAMOTIDINE IN NACL 20-0.9 MG/50ML-% IV SOLN: 20.0000 mg | Freq: Once | INTRAVENOUS | Status: DC | PRN

## 2020-08-07 MED ORDER — METHYLPREDNISOLONE SODIUM SUCC 125 MG IJ SOLR: 125.0000 mg | Freq: Once | INTRAMUSCULAR | Status: DC | PRN

## 2020-08-07 MED ORDER — EPINEPHRINE 0.3 MG/0.3ML IJ SOAJ: 0.3000 mg | Freq: Once | INTRAMUSCULAR | Status: DC | PRN

## 2020-08-07 MED ORDER — SODIUM CHLORIDE 0.9 % IV SOLN: INTRAVENOUS | Status: DC | PRN

## 2020-08-07 MED ORDER — SODIUM CHLORIDE 0.9 % IV SOLN
1200.0000 mg | Freq: Once | INTRAVENOUS | Status: AC
  Administered 2020-08-07: 1200 mg via INTRAVENOUS
  Filled 2020-08-07: qty 10

## 2020-08-07 NOTE — Progress Notes (Signed)
  Diagnosis: COVID-19  Physician: Wright, MD  Procedure: Covid Infusion Clinic Med: casirivimab\imdevimab infusion - Provided patient with casirivimab\imdevimab fact sheet for patients, parents and caregivers prior to infusion.  Complications: No immediate complications noted.  Discharge: Discharged home   Lamine Laton R Rook Maue 08/07/2020   

## 2020-08-07 NOTE — Discharge Instructions (Signed)

## 2020-08-07 NOTE — Telephone Encounter (Signed)
I connected by phone with Amber Bentley on 08/07/2020 at 10:41 AM to discuss the potential use of a new treatment for mild to moderate COVID-19 viral infection in non-hospitalized patients.  This patient is a 44 y.o. female that meets the FDA criteria for Emergency Use Authorization of COVID monoclonal antibody casirivimab/imdevimab.  Has a (+) direct SARS-CoV-2 viral test result  Has mild or moderate COVID-19   Is NOT hospitalized due to COVID-19  Is within 10 days of symptom onset (8/15)  Has at least one of the high risk factor(s) for progression to severe COVID-19 and/or hospitalization as defined in EUA.  Specific high risk criteria : BMI > 25; HTN; HFpEF   I have spoken and communicated the following to the patient or parent/caregiver regarding COVID monoclonal antibody treatment:  1. FDA has authorized the emergency use for the treatment of mild to moderate COVID-19 in adults and pediatric patients with positive results of direct SARS-CoV-2 viral testing who are 57 years of age and older weighing at least 40 kg, and who are at high risk for progressing to severe COVID-19 and/or hospitalization.  2. The significant known and potential risks and benefits of COVID monoclonal antibody, and the extent to which such potential risks and benefits are unknown.  3. Information on available alternative treatments and the risks and benefits of those alternatives, including clinical trials.  4. Patients treated with COVID monoclonal antibody should continue to self-isolate and use infection control measures (e.g., wear mask, isolate, social distance, avoid sharing personal items, clean and disinfect high touch surfaces, and frequent handwashing) according to CDC guidelines.   5. The patient or parent/caregiver has the option to accept or refuse COVID monoclonal antibody treatment.  After reviewing this information with the patient, The patient agreed to proceed with receiving  casirivimab\imdevimab infusion and will be provided a copy of the Fact sheet prior to receiving the infusion.   Nicolasa Ducking, NP 08/07/2020 10:41 AM

## 2020-08-07 NOTE — Progress Notes (Signed)
I connected by phone with Amber Bentley on 08/07/2020 at 10:47 AM to discuss the potential use of a new treatment for mild to moderate COVID-19 viral infection in non-hospitalized patients.  This patient is a 44 y.o. female that meets the FDA criteria for Emergency Use Authorization of COVID monoclonal antibody casirivimab/imdevimab.  Has a (+) direct SARS-CoV-2 viral test result  Has mild or moderate COVID-19   Is NOT hospitalized due to COVID-19  Is within 10 days of symptom onset *8/15)  Has at least one of the high risk factor(s) for progression to severe COVID-19 and/or hospitalization as defined in EUA.  Specific high risk criteria : BMI > 25; HFpEF   I have spoken and communicated the following to the patient or parent/caregiver regarding COVID monoclonal antibody treatment:  1. FDA has authorized the emergency use for the treatment of mild to moderate COVID-19 in adults and pediatric patients with positive results of direct SARS-CoV-2 viral testing who are 30 years of age and older weighing at least 40 kg, and who are at high risk for progressing to severe COVID-19 and/or hospitalization.  2. The significant known and potential risks and benefits of COVID monoclonal antibody, and the extent to which such potential risks and benefits are unknown.  3. Information on available alternative treatments and the risks and benefits of those alternatives, including clinical trials.  4. Patients treated with COVID monoclonal antibody should continue to self-isolate and use infection control measures (e.g., wear mask, isolate, social distance, avoid sharing personal items, clean and disinfect "high touch" surfaces, and frequent handwashing) according to CDC guidelines.   5. The patient or parent/caregiver has the option to accept or refuse COVID monoclonal antibody treatment.  After reviewing this information with the patient, The patient agreed to proceed with receiving  casirivimab\imdevimab infusion and will be provided a copy of the Fact sheet prior to receiving the infusion. Nicolasa Ducking, NP 08/07/2020 10:47 AM

## 2020-08-24 MED FILL — METFORMIN HCL 500 MG TABS: 500 | 30 days supply | Qty: 30 | Fill #1

## 2020-08-29 DIAGNOSIS — I159 Secondary hypertension, unspecified: Secondary | ICD-10-CM | POA: Diagnosis not present

## 2020-08-29 DIAGNOSIS — I5032 Chronic diastolic (congestive) heart failure: Secondary | ICD-10-CM | POA: Diagnosis not present

## 2020-08-29 DIAGNOSIS — I5041 Acute combined systolic (congestive) and diastolic (congestive) heart failure: Secondary | ICD-10-CM | POA: Diagnosis not present

## 2020-09-07 ENCOUNTER — Other Ambulatory Visit (INDEPENDENT_AMBULATORY_CARE_PROVIDER_SITE_OTHER): Payer: Self-pay | Admitting: Family Medicine

## 2020-09-07 DIAGNOSIS — K219 Gastro-esophageal reflux disease without esophagitis: Secondary | ICD-10-CM

## 2020-09-08 ENCOUNTER — Other Ambulatory Visit (INDEPENDENT_AMBULATORY_CARE_PROVIDER_SITE_OTHER): Payer: Self-pay | Admitting: Family Medicine

## 2020-09-08 DIAGNOSIS — K219 Gastro-esophageal reflux disease without esophagitis: Secondary | ICD-10-CM

## 2020-09-20 ENCOUNTER — Ambulatory Visit (INDEPENDENT_AMBULATORY_CARE_PROVIDER_SITE_OTHER): Payer: 59 | Admitting: Cardiovascular Disease

## 2020-09-20 ENCOUNTER — Encounter: Payer: Self-pay | Admitting: Cardiovascular Disease

## 2020-09-20 ENCOUNTER — Other Ambulatory Visit: Payer: Self-pay

## 2020-09-20 ENCOUNTER — Other Ambulatory Visit: Payer: Self-pay | Admitting: Cardiovascular Disease

## 2020-09-20 VITALS — BP 160/96 | HR 68 | Ht 67.0 in | Wt 344.8 lb

## 2020-09-20 DIAGNOSIS — I509 Heart failure, unspecified: Secondary | ICD-10-CM

## 2020-09-20 DIAGNOSIS — I10 Essential (primary) hypertension: Secondary | ICD-10-CM

## 2020-09-20 DIAGNOSIS — K219 Gastro-esophageal reflux disease without esophagitis: Secondary | ICD-10-CM | POA: Diagnosis not present

## 2020-09-20 MED ORDER — PANTOPRAZOLE SODIUM 40 MG PO TBEC: 40.0000 mg | DELAYED_RELEASE_TABLET | Freq: Every day | ORAL | 1 refills | Status: DC

## 2020-09-20 MED FILL — PANTOPRAZOLE SOD DR 40 MG T: 40 | 90 days supply | Qty: 90 | Fill #0

## 2020-09-20 NOTE — Patient Instructions (Signed)
Medication Instructions:  Your provider recommends that you continue on your current medications as directed. Please refer to the Current Medication list given to you today.   *If you need a refill on your cardiac medications before your next appointment, please call your pharmacy*  Follow-Up: At [facility], you and your health needs are our priority.  As part of our continuing mission to provide you with exceptional heart care, we have created designated Provider Care Teams.  These Care Teams include your primary Cardiologist (physician) and Advanced Practice Providers (APPs -  Physician Assistants and Nurse Practitioners) who all work together to provide you with the care you need, when you need it. Your next appointment:   6 month(s) The format for your next appointment:   In Person Provider:   You may see Philip Nahser, MD or one of the following Advanced Practice Providers on your designated Care Team:    Scott Weaver, PA-C  Vin Bhagat, PA-C   

## 2020-09-20 NOTE — Progress Notes (Signed)
Cardiology Office Note   Date:  09/20/2020   ID:  Yohanna Tow, DOB 16-Apr-1976, MRN [id number]  PCP:  Anne Ng, NP  Cardiologist:   Kristeen Miss, MD   Chief Complaint  Patient presents with   Hypertension   Congestive Heart Failure   Problem list 1. Hypertensive emergency 2. Morbid obesity 3. Pulmonary hypertension-moderate 4. Possible sleep apnea    Dalis Beers is a 44 y.o. female who presents for follow-up of her recent hospitalization for hypertensive emergency.  Katrese has had high blood pressure for several years but has not been on medications.  I saw her in the [facility] emergency. Room. She has been started on medications. She was in the hospital for 4 days  She seems to be tolerating her medications without any problems.  She had an echocardiogram which revealed mildly depressed left ventricle systolic function with an EF of 45-50%. She has grade 2 diastolic dysfunction.  She has moderate pulmonary hypertension with an estimate PA pressure of 59.  Is avoiding salt. 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  Is a Administrator .  Smokes some .    April 08, 2017:  Jull is seen as a work in visit today for chest pain . Stopped smoking in March .  Has gained some weight .  Wt. Today is 303 .   Has cluster headaches.   Wt Readings from Last 3 Encounters:  09/20/20 (!) 344 lb 12.8 oz (156.4 kg)  06/28/20 (!) 338 lb (153.3 kg)  06/17/20 (!) 340 lb 9.6 oz (154.5 kg)    Ezma started having some CP several years ago . Overall had a pretty good weekend - not much in the way of CP. The CP is a heaviness,   Last for hours.   Lasted most of the day yesterday . Seems to related to upper body movement - was not worsened with walking .     Has a significant family hx of CAD   [name], 2018: Taleeyah is seen  Wt is 333.   Has not been exercising .   Teaches  preschool .  Has 2 children - age 2 ( son) and 80 ( daughter) . Eats poorly,   Knows that she eats the wrong foods.  Blames stress   BP has been stable , no heart failure symptoms  Has cluster headaches.   Developed a MRSA infection on her nose related to her CPAP .   Apr 16, 2018: Ahsley is seen today for follow-up of her hypertension and chronic combined systolic and diastolic congestive heart failure.  Her blood pressures is well controlled.  Wt = 311 ( down  from 324 on March, 2019  Has lost 45 lbs total   Has been having some dizziness since she has been losing weight  Lots of orthostatic hypotension   Still has chest pain  - especially if she puls her CPAP off at night   Has stopped smoking   Is exercising .  Walks 4 days a week .   Breakfast - 2 egg omlett with thin slice of cheese on a wrap , fruit  Lunch -  Lean cuisine, yogart, cup of fruit Dinner - 6 oz of lean meat, 2 cups of veggies  [name], 2019:  Has continued to lose weight .  Seen with wife , [name].   Wt  is 302 now .    Has mild CP  Still doing well with her diet  Exercising .     Apr 27, 2020:  Molly Maduro is seen today.  She called a week or so ago and had marked hypertension and shortness of breath.  Unfortunately, she is regained weight covid weight .    Her weight today is 343 pounds.  She was very sick several weeks ago.   Thought she had covid. Tested negative twice. Symptoms lingered for 2-3 days .  Did not lose her sense of taste or smell.  We added back  lasix prn.   ,  Increased her coreg.     Was seen by Vin in late April ECG showed TWI   June 17, 2020:  Lynsey  is seen today for follow-up visit.  She has a history of congestive heart failure, hypertension, hyperlipidemia, obesity.  She gained a lot of weight since I last saw her. Wt today is 340 lbs. ( down 3 lbs)  She is an emotional eater.  Eats sweats after work.    September 20, 2020:  Molly Maduro is seen today for follow-up visit.  She has a  history of obesity, congestive heart failure, hypertension, hyperlipidemia.  Her weight today is 344 pounds which is up 4 pounds from her last visit. Life is ok Had covid this past summer.  Had been vaccinated.   Was sick for 5 days.   Got monoclonal antibodies.  Is not paying attention to salt intake We discussed using Kdur.      Past Medical History:  Diagnosis Date   Anemia    Anxiety    Cardiomegaly    Chest pain    CHF (congestive heart failure) (HCC)    Constipation    Depression    Dyspnea    Fatigue    Food allergy    celery   GERD (gastroesophageal reflux disease)    Hip pain    HLD (hyperlipidemia)    HTN (hypertension)    Infertility, female    Lactose intolerance    Leg edema    Lower back pain    OSA on CPAP    Palpitations    Prediabetes    Shoulder pain    Stomach ulcer    Varicose vein of leg     Past Surgical History:  Procedure Laterality Date   ABDOMINAL HYSTERECTOMY     ENDOMETRIAL ABLATION  2007   prior to hysterectomy     Current Outpatient Medications  Medication Sig Dispense Refill   carvedilol (COREG) 25 MG tablet Take 1 tablet (25 mg total) by mouth 2 (two) times daily. 180 tablet 3   furosemide (LASIX) 20 MG tablet Take 1 tablet (20 mg total) by mouth daily as needed for fluid or edema. 90 tablet 3   hydrALAZINE (APRESOLINE) 25 MG tablet TAKE 1 TABLET BY MOUTH TWICE DAILY 180 tablet 0   losartan (COZAAR) 100 MG tablet Take 1 tablet (100 mg total) by mouth daily. 90 tablet 3   metFORMIN (GLUCOPHAGE) 500 MG tablet TAKE 1 TABLET BY MOUTH ONCE DAILY WITH BREAKFAST 30 tablet 2   pantoprazole (PROTONIX) 40 MG tablet Take 1 tablet (40 mg total) by mouth daily. 30 tablet 0   rosuvastatin (CRESTOR) 10 MG tablet Take 1 tablet (10 mg total) by mouth daily. 90 tablet 0   spironolactone (ALDACTONE) 25 MG tablet TAKE 1 TABLET BY MOUTH DAILY 90 tablet 3   Vitamin D, Ergocalciferol, (DRISDOL) 1.25  MG (50000 UNIT)  CAPS capsule Take 1 capsule (50,000 Units total) by mouth every 7 (seven) days. 8 capsule 0   No current facility-administered medications for this visit.    No flowsheet data found.    Allergies:   Food and Aleve [naproxen]    Social History:  The patient  reports that she has quit smoking. Her smoking use included cigarettes. She has a 2.50 pack-year smoking history. She has never used smokeless tobacco. She reports current alcohol use. She reports that she does not use drugs.   Family History:  The patient's family history includes Anxiety disorder in her father; Breast cancer in her maternal aunt, maternal grandmother, mother, and paternal grandmother; Cancer in her maternal aunt, maternal grandmother, mother, and paternal grandfather; Depression in her father and mother; Diabetes in her father; Heart disease (age of onset: 28) in her father; Hyperlipidemia in her father; Hypertension in her father and mother; Kidney disease in her mother; Obesity in her father and mother; Sleep apnea in her father; Sudden death in her brother; Thyroid disease in her sister.    ROS:    Noted in current history, otherwise review of systems is negative.  Physical Exam: Blood pressure (!) 160/96, pulse 68, height 5\' 7"  (1.702 m), weight (!) 344 lb 12.8 oz (156.4 kg), SpO2 96 %.  GEN:  Well nourished, well developed in no acute distress HEENT: Normal NECK: No JVD; No carotid bruits LYMPHATICS: No lymphadenopathy CARDIAC: RRR , no murmurs, rubs, gallops RESPIRATORY:  Clear to auscultation without rales, wheezing or rhonchi  ABDOMEN: Soft, non-tender, non-distended MUSCULOSKELETAL:  No edema; No deformity  SKIN: Warm and dry NEUROLOGIC:  Alert and oriented x 3     EKG:     Recent Labs: 10/27/2019: ALT 16 04/13/2020: BUN 20; Creatinine, Ser 0.94; Hemoglobin 14.7; NT-Pro BNP 56; Platelets 208; Potassium 4.6; Sodium 142; TSH 1.440    Lipid Panel    Component Value Date/Time   CHOL 167  10/27/2019 0800   TRIG 64 10/27/2019 0800   HDL 51 10/27/2019 0800   CHOLHDL 3.3 04/08/2017 1124   CHOLHDL 4.7 02/22/2017 0420   VLDL 11 02/22/2017 0420   LDLCALC 104 (H) 10/27/2019 0800      Wt Readings from Last 3 Encounters:  09/20/20 (!) 344 lb 12.8 oz (156.4 kg)  06/28/20 (!) 338 lb (153.3 kg)  06/17/20 (!) 340 lb 9.6 oz (154.5 kg)      Other studies Reviewed: Additional studies/ records that were reviewed today include: . Review of the above records demonstrates:    ASSESSMENT AND PLAN:  1. Chest pain:   No further episodes of CP   2.   Hypertensive emergency:  BP remains elevated.   She needs to work on her salt intake and weight loss.   We have discussed this in great deal in the past.  Advised exersie, diet   3.  Chronic combined systolic and diastolic congestive heart failure:  No further symptoms of CHF   3.  Pulmonary hypertension:  -     4. Morbid obesity:       Weight is back up some.   She admits that she eats too many sweets.        Current medicines are reviewed at length with the patient today.  The patient does not have concerns regarding medicines.  Labs/ tests ordered today include:   No orders of the defined types were placed in this encounter.   Disposition:   FU with  me in 6 months    Kristeen Miss, MD  09/20/2020 4:22 PM    [facility] [address] [place], [place], [place]  [address] Phone: [phone]; Fax: [phone]

## 2020-09-28 DIAGNOSIS — I159 Secondary hypertension, unspecified: Secondary | ICD-10-CM | POA: Diagnosis not present

## 2020-09-28 DIAGNOSIS — I5041 Acute combined systolic (congestive) and diastolic (congestive) heart failure: Secondary | ICD-10-CM | POA: Diagnosis not present

## 2020-09-28 DIAGNOSIS — I5032 Chronic diastolic (congestive) heart failure: Secondary | ICD-10-CM | POA: Diagnosis not present

## 2020-10-15 ENCOUNTER — Other Ambulatory Visit: Payer: Self-pay | Admitting: Cardiology

## 2020-10-15 MED FILL — METFORMIN HCL 500 MG TABS: 500 | 30 days supply | Qty: 30 | Fill #2

## 2020-10-17 ENCOUNTER — Other Ambulatory Visit: Payer: Self-pay | Admitting: Cardiovascular Disease

## 2020-10-17 MED FILL — HYDRALAZINE HCL 25 MG TABS: 25 | 90 days supply | Qty: 180 | Fill #0

## 2020-10-29 DIAGNOSIS — I5032 Chronic diastolic (congestive) heart failure: Secondary | ICD-10-CM | POA: Diagnosis not present

## 2020-10-29 DIAGNOSIS — I5041 Acute combined systolic (congestive) and diastolic (congestive) heart failure: Secondary | ICD-10-CM | POA: Diagnosis not present

## 2020-10-29 DIAGNOSIS — I159 Secondary hypertension, unspecified: Secondary | ICD-10-CM | POA: Diagnosis not present

## 2020-11-22 MED FILL — ROSUVASTATIN CALCIUM 10 MG: 10 | 90 days supply | Qty: 90 | Fill #1

## 2020-11-28 DIAGNOSIS — I5041 Acute combined systolic (congestive) and diastolic (congestive) heart failure: Secondary | ICD-10-CM | POA: Diagnosis not present

## 2020-11-28 DIAGNOSIS — I159 Secondary hypertension, unspecified: Secondary | ICD-10-CM | POA: Diagnosis not present

## 2020-11-28 DIAGNOSIS — I5032 Chronic diastolic (congestive) heart failure: Secondary | ICD-10-CM | POA: Diagnosis not present

## 2020-12-05 DIAGNOSIS — Z20822 Contact with and (suspected) exposure to covid-19: Secondary | ICD-10-CM | POA: Diagnosis not present

## 2020-12-08 ENCOUNTER — Other Ambulatory Visit (INDEPENDENT_AMBULATORY_CARE_PROVIDER_SITE_OTHER): Payer: Self-pay | Admitting: Family Medicine

## 2020-12-08 DIAGNOSIS — R7303 Prediabetes: Secondary | ICD-10-CM

## 2020-12-08 MED FILL — SPIRONOLACTONE 25 MG TABS: 25 | 90 days supply | Qty: 90 | Fill #1

## 2020-12-23 DIAGNOSIS — Z1159 Encounter for screening for other viral diseases: Secondary | ICD-10-CM | POA: Diagnosis not present

## 2020-12-29 DIAGNOSIS — I5041 Acute combined systolic (congestive) and diastolic (congestive) heart failure: Secondary | ICD-10-CM | POA: Diagnosis not present

## 2020-12-29 DIAGNOSIS — I159 Secondary hypertension, unspecified: Secondary | ICD-10-CM | POA: Diagnosis not present

## 2020-12-29 DIAGNOSIS — I5032 Chronic diastolic (congestive) heart failure: Secondary | ICD-10-CM | POA: Diagnosis not present

## 2021-01-29 DIAGNOSIS — I159 Secondary hypertension, unspecified: Secondary | ICD-10-CM | POA: Diagnosis not present

## 2021-01-29 DIAGNOSIS — I5032 Chronic diastolic (congestive) heart failure: Secondary | ICD-10-CM | POA: Diagnosis not present

## 2021-01-29 DIAGNOSIS — I5041 Acute combined systolic (congestive) and diastolic (congestive) heart failure: Secondary | ICD-10-CM | POA: Diagnosis not present

## 2021-02-07 ENCOUNTER — Other Ambulatory Visit (HOSPITAL_COMMUNITY): Payer: Self-pay | Admitting: Family Medicine

## 2021-02-07 ENCOUNTER — Other Ambulatory Visit: Payer: Self-pay

## 2021-02-07 ENCOUNTER — Encounter (HOSPITAL_COMMUNITY): Payer: Self-pay

## 2021-02-07 ENCOUNTER — Ambulatory Visit (HOSPITAL_COMMUNITY)
Admission: EM | Admit: 2021-02-07 | Discharge: 2021-02-07 | Disposition: A | Discharge status: Home / Self Care | Payer: 59 | Attending: Family Medicine | Admitting: Family Medicine

## 2021-02-07 DIAGNOSIS — L02211 Cutaneous abscess of abdominal wall: Secondary | ICD-10-CM | POA: Diagnosis not present

## 2021-02-07 DIAGNOSIS — R030 Elevated blood-pressure reading, without diagnosis of hypertension: Secondary | ICD-10-CM | POA: Diagnosis not present

## 2021-02-07 MED ORDER — LIDOCAINE-EPINEPHRINE 1 %-1:100000 IJ SOLN
INTRAMUSCULAR | Status: AC
  Filled 2021-02-07: qty 1

## 2021-02-07 MED ORDER — HIBICLENS 4 % EX LIQD: Freq: Every day | CUTANEOUS | 0 refills | Status: DC | PRN

## 2021-02-07 MED ORDER — SULFAMETHOXAZOLE-TRIMETHOPRIM 800-160 MG PO TABS: 1.0000 | ORAL_TABLET | Freq: Two times a day (BID) | ORAL | 0 refills | Status: DC

## 2021-02-07 NOTE — ED Triage Notes (Signed)
Pt presents with an abscess on lower abdomen area. Pt states she has had a small bump a month ago. She states the bump started itching last week and states the bump became bigger over the weekend. She states the area is hard a swollen.

## 2021-02-07 NOTE — ED Provider Notes (Signed)
[facility]    CSN: [id number] Arrival date & time: 02/07/21  1938      History   Chief Complaint Chief Complaint  Patient presents with  . Abscess    HPI Amber Bentley is a 45 y.o. female.   Here today with worsening pain, swelling and redness developing the past week or so left lower abdomen. Area initially became irritated about a month ago but then it was mostly itchy and red, not a bump at that time. Denies fever, chills, sweats but having worsening pain to the area. Has not had any drainage. Not trying anything to the area. No known history of similar issues.      Past Medical History:  Diagnosis Date  . Anemia   . Anxiety   . Cardiomegaly   . Chest pain   . CHF (congestive heart failure) (HCC)   . Constipation   . Depression   . Dyspnea   . Fatigue   . Food allergy    celery  . GERD (gastroesophageal reflux disease)   . Hip pain   . HLD (hyperlipidemia)   . HTN (hypertension)   . Infertility, female   . Lactose intolerance   . Leg edema   . Lower back pain   . OSA on CPAP   . Palpitations   . Prediabetes   . Shoulder pain   . Stomach ulcer   . Varicose vein of leg     Patient Active Problem List   Diagnosis Date Noted  . Vitamin D deficiency 01/14/2018  . Prediabetes 01/14/2018  . Essential hypertension 07/30/2017  . OSA (obstructive sleep apnea) 07/20/2017  . Nasal sore 07/18/2017  . Chronic diastolic CHF (congestive heart failure) (HCC) 03/01/2017  . Pulmonary hypertension (HCC) 03/01/2017  . Morbid obesity (HCC)   . SOB (shortness of breath)   . Migraine without status migrainosus, not intractable   . Altered mental state   . Hypertensive cardiomyopathy, 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HCC)   . Congestive heart failure (HCC)   . Pulmonary edema   . 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 HTN (hypertension), benign 02/21/2017  . Adjustment disorder with mixed anxiety and depressed mood 02/21/2017  . Acute combined systolic and  diastolic congestive heart failure (HCC) 02/21/2017  . Hypertensive emergency 02/21/2017  . Tobacco abuse 02/21/2017  . Chest pain 02/21/2017  . Anxiety and depression 02/21/2017    Past Surgical History:  Procedure Laterality Date  . ABDOMINAL HYSTERECTOMY    . ENDOMETRIAL ABLATION  2007   prior to hysterectomy    OB History   No obstetric history on file.      Home Medications    Prior to Admission medications   Medication Sig Start Date End Date Taking? Authorizing Provider  chlorhexidine (HIBICLENS) 4 % external liquid Apply topically daily as needed. 02/07/21  Yes Particia Nearing, PA-C  sulfamethoxazole-trimethoprim (BACTRIM DS) 800-160 MG tablet Take 1 tablet by mouth 2 (two) times daily for 10 days. 02/07/21 02/17/21 Yes Particia Nearing, PA-C  carvedilol (COREG) 25 MG tablet Take 1 tablet (25 mg total) by mouth 2 (two) times daily. 05/12/20   Nahser, Deloris Ping, MD  furosemide (LASIX) 20 MG tablet Take 1 tablet (20 mg total) by mouth daily as needed for fluid or edema. 04/14/20 09/20/20  Manson Passey, PA  hydrALAZINE (APRESOLINE) 25 MG tablet TAKE 1 TABLET BY MOUTH TWICE DAILY 10/17/20   Nahser, Deloris Ping, MD  losartan (COZAAR) 100 MG tablet Take 1 tablet (  100 mg total) by mouth daily. 09/02/19   Georgie Chard D, NP  metFORMIN (GLUCOPHAGE) 500 MG tablet TAKE 1 TABLET BY MOUTH ONCE DAILY WITH BREAKFAST 06/28/20   Langston Reusing, MD  pantoprazole (PROTONIX) 40 MG tablet Take 1 tablet (40 mg total) by mouth daily. 09/20/20 03/19/21  Nahser, Deloris Ping, MD  rosuvastatin (CRESTOR) 10 MG tablet Take 1 tablet (10 mg total) by mouth daily. 06/28/20   Langston Reusing, MD  spironolactone (ALDACTONE) 25 MG tablet TAKE 1 TABLET BY MOUTH DAILY 07/19/20   Nahser, Deloris Ping, MD  Vitamin D, Ergocalciferol, (DRISDOL) 1.25 MG (50000 UNIT) CAPS capsule Take 1 capsule (50,000 Units total) by mouth every 7 (seven) days. 04/19/20   Langston Reusing, MD    Family History Family  History  Problem Relation Age of Onset  . Hypertension Mother   . Cancer Mother        breast cancer  . Kidney disease Mother   . Depression Mother   . Obesity Mother   . Breast cancer Mother   . Diabetes Father   . Heart disease Father 60  . Hyperlipidemia Father   . Hypertension Father   . Depression Father   . Anxiety disorder Father   . Sleep apnea Father   . Obesity Father   . Thyroid disease Sister   . Sudden death Brother   . Cancer Maternal Aunt        breast cancer  . Breast cancer Maternal Aunt        in 47's  . Cancer Paternal Grandfather        brain cancer  . Cancer Maternal Grandmother        breast  . Breast cancer Maternal Grandmother   . Breast cancer Paternal Grandmother     Social History Social History   Tobacco Use  . Smoking status: Former Smoker    Packs/day: 0.25    Years: 10.00    Pack years: 2.50    Types: Cigarettes  . Smokeless tobacco: Never Used  Vaping Use  . Vaping Use: Never used  Substance Use Topics  . Alcohol use: Yes    Comment: rarely  . Drug use: No     Allergies   Food and Aleve [naproxen]   Review of Systems Review of Systems PER HPI    Physical Exam Triage Vital Signs ED Triage Vitals  Enc Vitals Group     BP 02/07/21 1956 (!) 192/95     Pulse Rate 02/07/21 1956 84     Resp 02/07/21 1956 16     Temp 02/07/21 1956 98.7 F (37.1 C)     Temp Source 02/07/21 1956 Oral     SpO2 02/07/21 1956 98 %     Weight --      Height --      Head Circumference --      Peak Flow --      Pain Score 02/07/21 1954 4     Pain Loc --      Pain Edu? --      Excl. in GC? --    No data found.  Updated Vital Signs BP (!) 192/95 (BP Location: Right Arm)   Pulse 84   Temp 98.7 F (37.1 C) (Oral)   Resp 16   SpO2 98%   Visual Acuity Right Eye Distance:   Left Eye Distance:   Bilateral Distance:    Right Eye Near:   Left Eye Near:  Bilateral Near:     Physical Exam Vitals and nursing note reviewed.   Constitutional:      Appearance: Normal appearance. She is not ill-appearing.  HENT:     Head: Atraumatic.  Eyes:     Extraocular Movements: Extraocular movements intact.     Conjunctiva/sclera: Conjunctivae normal.  Cardiovascular:     Rate and Rhythm: Normal rate and regular rhythm.     Heart sounds: Normal heart sounds.  Pulmonary:     Effort: Pulmonary effort is normal.     Breath sounds: Normal breath sounds.  Musculoskeletal:        General: Normal range of motion.     Cervical back: Normal range of motion and neck supple.  Skin:    General: Skin is warm and dry.     Comments: Area of erythema left lower abdomen in pannus fold with central abscess formed. Large area of whitehead with fluctuance present in center of mass. ttp  Neurological:     Mental Status: She is alert and oriented to person, place, and time.  Psychiatric:        Mood and Affect: Mood normal.        Thought Content: Thought content normal.        Judgment: Judgment normal.      UC Treatments / Results  Labs (all labs ordered are listed, but only abnormal results are displayed) Labs Reviewed - No data to display  EKG   Radiology No results found.  Procedures Incision and Drainage  Date/Time: 02/07/2021 8:30 PM Performed by: Particia Nearing, PA-C Authorized by: Particia Nearing, PA-C   Consent:    Consent obtained:  Verbal   Consent given by:  Patient   Risks, benefits, and alternatives were discussed: yes     Risks discussed:  Bleeding, incomplete drainage, pain and infection   Alternatives discussed:  Alternative treatment Universal protocol:    Procedure explained and questions answered to patient or proxy's satisfaction: yes     Relevant documents present and verified: yes     Test results available : yes     Imaging studies available: yes     Required blood products, implants, devices, and special equipment available: yes     Site/side marked: yes     Immediately  prior to procedure, a time out was called: yes     Patient identity confirmed:  Verbally with patient and arm band Location:    Type:  Abscess   Location:  Trunk   Trunk location:  Abdomen Pre-procedure details:    Skin preparation:  Chlorhexidine with alcohol Sedation:    Sedation type:  None Anesthesia:    Anesthesia method:  Local infiltration   Local anesthetic:  Lidocaine 2% WITH epi Procedure type:    Complexity:  Simple Procedure details:    Ultrasound guidance: no     Needle aspiration: no     Incision types:  Stab incision   Incision depth:  Subcutaneous   Wound management:  Probed and deloculated   Drainage:  Purulent   Drainage amount:  Copious   Wound treatment:  Wound left open   Packing materials:  None Post-procedure details:    Procedure completion:  Tolerated well, no immediate complications   (including critical care time)  Medications Ordered in UC Medications - No data to display  Initial Impression / Assessment and Plan / UC Course  I have reviewed the triage vital signs and the nursing notes.  Pertinent labs & imaging  results that were available during my care of the patient were reviewed by me and considered in my medical decision making (see chart for details).     I and D performed successfully today, pressure dressing applied, home wound care reviewed, bactrim and hibiclens sent. Discussed recheck in 1 week if not resolving fully. OTC pain relievers prn. BP significantly elevated in clinic today as well, discussed close PCP f/u, home monitoring, DASH diet, exercise. She denies CP, SOB, dizziness, HAs today.   Final Clinical Impressions(s) / UC Diagnoses   Final diagnoses:  Abscess of skin of abdomen  Elevated blood pressure reading   Discharge Instructions   None    ED Prescriptions    Medication Sig Dispense Auth. Provider   sulfamethoxazole-trimethoprim (BACTRIM DS) 800-160 MG tablet Take 1 tablet by mouth 2 (two) times daily for 10  days. 20 tablet Particia Nearing, [place]   chlorhexidine (HIBICLENS) 4 % external liquid Apply topically daily as needed. 120 mL Particia Nearing, [place]     PDMP not reviewed this encounter.   Roosvelt Maser Syl[place], [place] 02/10/21 [phone]

## 2021-02-08 MED FILL — HIBICLENS 4% LIQUID: 4 | 30 days supply | Qty: 118 | Fill #0

## 2021-02-08 MED FILL — SULFAMETHOXAZOLE-TMP DS TAB: 800-160 | 10 days supply | Qty: 20 | Fill #0

## 2021-02-26 DIAGNOSIS — I5032 Chronic diastolic (congestive) heart failure: Secondary | ICD-10-CM | POA: Diagnosis not present

## 2021-02-26 DIAGNOSIS — I5041 Acute combined systolic (congestive) and diastolic (congestive) heart failure: Secondary | ICD-10-CM | POA: Diagnosis not present

## 2021-02-26 DIAGNOSIS — I159 Secondary hypertension, unspecified: Secondary | ICD-10-CM | POA: Diagnosis not present

## 2021-03-10 ENCOUNTER — Other Ambulatory Visit (HOSPITAL_BASED_OUTPATIENT_CLINIC_OR_DEPARTMENT_OTHER): Payer: Self-pay

## 2021-03-24 ENCOUNTER — Other Ambulatory Visit (HOSPITAL_COMMUNITY): Payer: Self-pay

## 2021-03-29 DIAGNOSIS — I5032 Chronic diastolic (congestive) heart failure: Secondary | ICD-10-CM | POA: Diagnosis not present

## 2021-03-29 DIAGNOSIS — I159 Secondary hypertension, unspecified: Secondary | ICD-10-CM | POA: Diagnosis not present

## 2021-03-29 DIAGNOSIS — I5041 Acute combined systolic (congestive) and diastolic (congestive) heart failure: Secondary | ICD-10-CM | POA: Diagnosis not present

## 2021-04-10 ENCOUNTER — Other Ambulatory Visit (HOSPITAL_COMMUNITY): Payer: Self-pay

## 2021-04-10 MED ORDER — CARVEDILOL 25 MG PO TABS
25.0000 mg | ORAL_TABLET | Freq: Two times a day (BID) | ORAL | 3 refills | Status: DC
  Filled 2021-04-10: qty 180, 90d supply, fill #0

## 2021-04-10 MED FILL — Pantoprazole Sodium EC Tab 40 MG (Base Equiv): ORAL | 90 days supply | Qty: 90 | Fill #0 | Status: AC

## 2021-04-11 ENCOUNTER — Other Ambulatory Visit (HOSPITAL_COMMUNITY): Payer: Self-pay

## 2021-04-28 DIAGNOSIS — I5041 Acute combined systolic (congestive) and diastolic (congestive) heart failure: Secondary | ICD-10-CM | POA: Diagnosis not present

## 2021-04-28 DIAGNOSIS — I5032 Chronic diastolic (congestive) heart failure: Secondary | ICD-10-CM | POA: Diagnosis not present

## 2021-04-28 DIAGNOSIS — I159 Secondary hypertension, unspecified: Secondary | ICD-10-CM | POA: Diagnosis not present

## 2021-05-17 MED FILL — Rosuvastatin Calcium Tab 10 MG: ORAL | 90 days supply | Qty: 90 | Fill #0 | Status: AC

## 2021-05-18 ENCOUNTER — Other Ambulatory Visit (HOSPITAL_COMMUNITY): Payer: Self-pay

## 2021-05-29 DIAGNOSIS — I5032 Chronic diastolic (congestive) heart failure: Secondary | ICD-10-CM | POA: Diagnosis not present

## 2021-05-29 DIAGNOSIS — I159 Secondary hypertension, unspecified: Secondary | ICD-10-CM | POA: Diagnosis not present

## 2021-05-29 DIAGNOSIS — I5041 Acute combined systolic (congestive) and diastolic (congestive) heart failure: Secondary | ICD-10-CM | POA: Diagnosis not present

## 2021-06-10 ENCOUNTER — Other Ambulatory Visit (HOSPITAL_COMMUNITY): Payer: Self-pay

## 2021-06-10 MED FILL — Spironolactone Tab 25 MG: ORAL | 90 days supply | Qty: 90 | Fill #0 | Status: AC

## 2021-06-11 ENCOUNTER — Other Ambulatory Visit: Payer: Self-pay

## 2021-06-12 ENCOUNTER — Other Ambulatory Visit (HOSPITAL_COMMUNITY): Payer: Self-pay

## 2021-06-12 MED ORDER — FUROSEMIDE 20 MG PO TABS
20.0000 mg | ORAL_TABLET | Freq: Every day | ORAL | 1 refills | Status: DC | PRN
  Filled 2021-06-12: qty 90, 90d supply, fill #0

## 2021-06-28 DIAGNOSIS — I5032 Chronic diastolic (congestive) heart failure: Secondary | ICD-10-CM | POA: Diagnosis not present

## 2021-06-28 DIAGNOSIS — I5041 Acute combined systolic (congestive) and diastolic (congestive) heart failure: Secondary | ICD-10-CM | POA: Diagnosis not present

## 2021-06-28 DIAGNOSIS — I159 Secondary hypertension, unspecified: Secondary | ICD-10-CM | POA: Diagnosis not present

## 2021-07-27 DIAGNOSIS — H5213 Myopia, bilateral: Secondary | ICD-10-CM | POA: Diagnosis not present

## 2021-07-29 DIAGNOSIS — I5041 Acute combined systolic (congestive) and diastolic (congestive) heart failure: Secondary | ICD-10-CM | POA: Diagnosis not present

## 2021-07-29 DIAGNOSIS — I5032 Chronic diastolic (congestive) heart failure: Secondary | ICD-10-CM | POA: Diagnosis not present

## 2021-07-29 DIAGNOSIS — I159 Secondary hypertension, unspecified: Secondary | ICD-10-CM | POA: Diagnosis not present

## 2021-08-10 ENCOUNTER — Ambulatory Visit: Payer: 59 | Admitting: Cardiovascular Disease

## 2021-08-22 ENCOUNTER — Other Ambulatory Visit: Payer: Self-pay | Admitting: Cardiovascular Disease

## 2021-08-22 ENCOUNTER — Other Ambulatory Visit (HOSPITAL_COMMUNITY): Payer: Self-pay

## 2021-08-22 DIAGNOSIS — K219 Gastro-esophageal reflux disease without esophagitis: Secondary | ICD-10-CM

## 2021-08-22 MED ORDER — PANTOPRAZOLE SODIUM 40 MG PO TBEC
DELAYED_RELEASE_TABLET | Freq: Every day | ORAL | 0 refills | Status: DC
  Filled 2021-08-22: qty 90, 90d supply, fill #0

## 2021-08-29 DIAGNOSIS — I5032 Chronic diastolic (congestive) heart failure: Secondary | ICD-10-CM | POA: Diagnosis not present

## 2021-08-29 DIAGNOSIS — I159 Secondary hypertension, unspecified: Secondary | ICD-10-CM | POA: Diagnosis not present

## 2021-08-29 DIAGNOSIS — I5041 Acute combined systolic (congestive) and diastolic (congestive) heart failure: Secondary | ICD-10-CM | POA: Diagnosis not present

## 2021-09-07 ENCOUNTER — Other Ambulatory Visit: Payer: Self-pay | Admitting: Cardiovascular Disease

## 2021-09-07 ENCOUNTER — Other Ambulatory Visit (HOSPITAL_COMMUNITY): Payer: Self-pay

## 2021-09-07 MED ORDER — HYDRALAZINE HCL 25 MG PO TABS
25.0000 mg | ORAL_TABLET | Freq: Two times a day (BID) | ORAL | 0 refills | Status: DC
Start: 2021-09-07 — End: 2021-11-16
  Filled 2021-09-07: qty 180, 90d supply, fill #0

## 2021-09-28 DIAGNOSIS — I5032 Chronic diastolic (congestive) heart failure: Secondary | ICD-10-CM | POA: Diagnosis not present

## 2021-09-28 DIAGNOSIS — I159 Secondary hypertension, unspecified: Secondary | ICD-10-CM | POA: Diagnosis not present

## 2021-09-28 DIAGNOSIS — I5041 Acute combined systolic (congestive) and diastolic (congestive) heart failure: Secondary | ICD-10-CM | POA: Diagnosis not present

## 2021-10-18 ENCOUNTER — Ambulatory Visit (INDEPENDENT_AMBULATORY_CARE_PROVIDER_SITE_OTHER): Payer: 59 | Admitting: Cardiovascular Disease

## 2021-10-18 ENCOUNTER — Encounter: Payer: Self-pay | Admitting: Cardiovascular Disease

## 2021-10-18 ENCOUNTER — Other Ambulatory Visit: Payer: Self-pay

## 2021-10-18 ENCOUNTER — Other Ambulatory Visit (HOSPITAL_COMMUNITY): Payer: Self-pay

## 2021-10-18 VITALS — BP 152/88 | HR 78 | Ht 67.0 in | Wt 342.8 lb

## 2021-10-18 DIAGNOSIS — I10 Essential (primary) hypertension: Secondary | ICD-10-CM

## 2021-10-18 MED ORDER — SPIRONOLACTONE 50 MG PO TABS
50.0000 mg | ORAL_TABLET | Freq: Every day | ORAL | 3 refills | Status: DC
  Filled 2021-10-18: qty 90, 90d supply, fill #0
  Filled 2022-03-12: qty 90, 90d supply, fill #1
  Filled 2022-08-13: qty 90, 90d supply, fill #2

## 2021-10-18 NOTE — Patient Instructions (Signed)
Medication Instructions:  1) START Spironolactone 50mg  once daily   *If you need a refill on your cardiac medications before your next appointment, please call your pharmacy*   Lab Work: BMET in 2-3 weeks  If you have labs (blood work) drawn today and your tests are completely normal, you will receive your results only by: MyChart Message (if you have MyChart) OR A paper copy in the mail If you have any lab test that is abnormal or we need to change your treatment, we will call you to review the results.   Testing/Procedures: None   Follow-Up: At [facility], you and your health needs are our priority.  As part of our continuing mission to provide you with exceptional heart care, we have created designated Provider Care Teams.  These Care Teams include your primary Cardiologist (physician) and Advanced Practice Providers (APPs -  Physician Assistants and Nurse Practitioners) who all work together to provide you with the care you need, when you need it.  We recommend signing up for the patient portal called "MyChart".  Sign up information is provided on this After Visit Summary.  MyChart is used to connect with patients for Virtual Visits (Telemedicine).  Patients are able to view lab/test results, encounter notes, upcoming appointments, etc.  Non-urgent messages can be sent to your provider as well.   To learn more about what you can do with MyChart, go to [facility].    Your next appointment:   6 month(s)  The format for your next appointment:   In Person  Provider:   You may see [email or web address], MD or one of the following Advanced Practice Providers on your designated Care Team:   Kristeen Miss, PA-C Tereso Newcomer, [place]   Other Instructions

## 2021-10-18 NOTE — Progress Notes (Signed)
Cardiology Office Note   Date:  10/18/2021   ID:  Amber Bentley, DOB Feb 26, 1976, MRN [id number]  PCP:  Anne Ng, NP  Cardiologist:   Kristeen Miss, MD   Chief Complaint  Patient presents with   Hypertension   Problem list 1. Hypertensive emergency 2. Morbid obesity 3. Pulmonary hypertension-moderate 4. Possible sleep apnea    Amber Bentley is a 45 y.o. female who presents for follow-up of her recent hospitalization for hypertensive emergency.  Amber Bentley has had high blood pressure for several years but has not been on medications.  I saw her in the [facility] emergency. Room. She has been started on medications. She was in the hospital for 4 days  She seems to be tolerating her medications without any problems.  She had an echocardiogram which revealed mildly depressed left ventricle systolic function with an EF of 45-50%. She has grade 2 diastolic dysfunction.  She has moderate pulmonary hypertension with an estimate PA pressure of 59.  Is avoiding salt. 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  Is a Administrator .  Smokes some .    April 08, 2017:  Amber Bentley is seen as a work in visit today for chest pain . Stopped smoking in March .  Has gained some weight .  Wt. Today is 303 .   Has cluster headaches.   Wt Readings from Last 3 Encounters:  10/18/21 (!) 342 lb 12.8 oz (155.5 kg)  09/20/20 (!) 344 lb 12.8 oz (156.4 kg)  06/28/20 (!) 338 lb (153.3 kg)    Amber Bentley started having some CP several years ago . Overall had a pretty good weekend - not much in the way of CP. The CP is a heaviness,   Last for hours.   Lasted most of the day yesterday . Seems to related to upper body movement - was not worsened with walking .     Has a significant family hx of CAD   [name], 2018: Amber Bentley is seen  Wt is 333.   Has not been exercising .   Teaches preschool .  Has 2 children -  age 15 ( son) and 25 ( daughter) . Eats poorly,   Knows that she eats the wrong foods.  Blames stress   BP has been stable , no heart failure symptoms  Has cluster headaches.   Developed a MRSA infection on her nose related to her CPAP .   Apr 16, 2018: Amber Bentley is seen today for follow-up of her hypertension and chronic combined systolic and diastolic congestive heart failure.  Her blood pressures is well controlled.  Wt = 311 ( down  from 324 on March, 2019  Has lost 45 lbs total   Has been having some dizziness since she has been losing weight  Lots of orthostatic hypotension   Still has chest pain  - especially if she puls her CPAP off at night   Has stopped smoking   Is exercising .  Walks 4 days a week .   Breakfast - 2 egg omlett with thin slice of cheese on a wrap , fruit  Lunch -  Lean cuisine, yogart, cup of fruit Dinner - 6 oz of lean meat, 2 cups of veggies  [name], 2019:  Has continued to lose weight .  Seen with wife , [name].   Wt is 302 now .  Has mild CP  Still doing well with her diet  Exercising .     Apr 27, 2020:  Amber Bentley is seen today.  She called a week or so ago and had marked hypertension and shortness of breath.  Unfortunately, she is regained weight covid weight .    Her weight today is 343 pounds.  She was very sick several weeks ago.   Thought she had covid. Tested negative twice. Symptoms lingered for 2-3 days .  Did not lose her sense of taste or smell.  We added back  lasix prn.   ,  Increased her coreg.     Was seen by Vin in late April ECG showed TWI   June 17, 2020:  Amber Bentley  is seen today for follow-up visit.  She has a history of congestive heart failure, hypertension, hyperlipidemia, obesity.  She gained a lot of weight since I last saw her. Wt today is 340 lbs. ( down 3 lbs)  She is an emotional eater.  Eats sweats after work.    September 20, 2020:  Amber Bentley is seen today for follow-up visit.  She has a history of obesity, congestive  heart failure, hypertension, hyperlipidemia.  Her weight today is 344 pounds which is up 4 pounds from her last visit. Life is ok Had covid this past summer.  Had been vaccinated.   Was sick for 5 days.   Got monoclonal antibodies.  Is not paying attention to salt intake We discussed using Kdur.   Nov. 2, 2022: Amber Bentley is seen  Wt is 342 Is in a good spot We discussed the difficulties on standing on a good diet.  Blood pressure is up a little bit.  Will increase spironolactone to 50 mg a day.  She will continue to work on a good low-salt low sugar diet.   Past Medical History:  Diagnosis Date   Anemia    Anxiety    Cardiomegaly    Chest pain    CHF (congestive heart failure) (HCC)    Constipation    Depression    Dyspnea    Fatigue    Food allergy    celery   GERD (gastroesophageal reflux disease)    Hip pain    HLD (hyperlipidemia)    HTN (hypertension)    Infertility, female    Lactose intolerance    Leg edema    Lower back pain    OSA on CPAP    Palpitations    Prediabetes    Shoulder pain    Stomach ulcer    Varicose vein of leg     Past Surgical History:  Procedure Laterality Date   ABDOMINAL HYSTERECTOMY     ENDOMETRIAL ABLATION  2007   prior to hysterectomy     Current Outpatient Medications  Medication Sig Dispense Refill   carvedilol (COREG) 25 MG tablet Take 1 tablet (25 mg total) by mouth 2 (two) times daily. 180 tablet 3   hydrALAZINE (APRESOLINE) 25 MG tablet Take 1 tablet (25 mg total) by mouth 2 (two) times daily. Please keep upcoming appointment in [place]  For future refills. Thank you 180 tablet 0   losartan (COZAAR) 100 MG tablet Take 1 tablet (100 mg total) by mouth daily. 90 tablet 3   pantoprazole (PROTONIX) 40 MG tablet TAKE 1 TABLET (40 MG TOTAL) BY MOUTH DAILY. 90 tablet 0   rosuvastatin (CRESTOR) 10 MG tablet TAKE 1 TABLET BY MOUTH ONCE DAILY 90 tablet 3   chlorhexidine (HIBICLENS) 4 %  external liquid APPLY TOPICALLY DAILY AS  NEEDED. (Patient not taking: Reported on 10/18/2021) 118 mL 0   furosemide (LASIX) 20 MG tablet Take 1 tablet (20 mg total) by mouth daily as needed for fluid or edema. 90 tablet 1   metFORMIN (GLUCOPHAGE) 500 MG tablet TAKE 1 TABLET BY MOUTH ONCE DAILY WITH BREAKFAST 30 tablet 2   spironolactone (ALDACTONE) 25 MG tablet TAKE 1 TABLET BY MOUTH DAILY 90 tablet 3   sulfamethoxazole-trimethoprim (BACTRIM DS) 800-160 MG tablet TAKE 1 TABLET BY MOUTH 2 TIMES DAILY FOR 10 DAYS (Patient not taking: Reported on 10/18/2021) 20 tablet 0   Vitamin D, Ergocalciferol, (DRISDOL) 1.25 MG (50000 UNIT) CAPS capsule Take 1 capsule (50,000 Units total) by mouth every 7 (seven) days. (Patient not taking: Reported on 10/18/2021) 8 capsule 0   No current facility-administered medications for this visit.    No flowsheet data found.    Allergies:   Food and Aleve [naproxen]    Social History:  The patient  reports that she has quit smoking. Her smoking use included cigarettes. She has a 2.50 pack-year smoking history. She has never used smokeless tobacco. She reports current alcohol use. She reports that she does not use drugs.   Family History:  The patient's family history includes Anxiety disorder in her father; Breast cancer in her maternal aunt, maternal grandmother, mother, and paternal grandmother; Cancer in her maternal aunt, maternal grandmother, mother, and paternal grandfather; Depression in her father and mother; Diabetes in her father; Heart disease (age of onset: 64) in her father; Hyperlipidemia in her father; Hypertension in her father and mother; Kidney disease in her mother; Obesity in her father and mother; Sleep apnea in her father; Sudden death in her brother; Thyroid disease in her sister.    ROS:    Noted in current history, otherwise review of systems is negative.  Physical Exam: Blood pressure (!) 152/88, pulse 78, height 5\' 7"  (1.702 m), weight (!) 342 lb 12.8 oz (155.5 kg), SpO2 97  %.  GEN:  Well nourished, well developed in no acute distress HEENT: Normal NECK: No JVD; No carotid bruits LYMPHATICS: No lymphadenopathy CARDIAC: RRR , no murmurs, rubs, gallops RESPIRATORY:  Clear to auscultation without rales, wheezing or rhonchi  ABDOMEN: Soft, non-tender, non-distended MUSCULOSKELETAL:  No edema; No deformity  SKIN: Warm and dry NEUROLOGIC:  Alert and oriented x 3     EKG:  Nov. 2, 2022  . NSR , NS ST abn.  No changes from previous    Recent Labs: No results found for requested labs within last 8760 hours.    Lipid Panel    Component Value Date/Time   CHOL 167 10/27/2019 0800   TRIG 64 10/27/2019 0800   HDL 51 10/27/2019 0800   CHOLHDL 3.3 04/08/2017 1124   CHOLHDL 4.7 02/22/2017 0420   VLDL 11 02/22/2017 0420   LDLCALC 104 (H) 10/27/2019 0800      Wt Readings from Last 3 Encounters:  10/18/21 (!) 342 lb 12.8 oz (155.5 kg)  09/20/20 (!) 344 lb 12.8 oz (156.4 kg)  06/28/20 (!) 338 lb (153.3 kg)      Other studies Reviewed: Additional studies/ records that were reviewed today include: . Review of the above records demonstrates:    ASSESSMENT AND PLAN:  1. Chest pain:      2.   Hypertensive emergency:   Blood pressure is a little bit elevated today.  I encouraged her to work on better diet, exercise and some weight  loss.  We will increase spironolactone to 50 mg a day.  Check a basic metabolic profile in 2 to 3 weeks.  3.  Pulmonary hypertension:  -     4. Morbid obesity:               Current medicines are reviewed at length with the patient today.  The patient does not have concerns regarding medicines.  Labs/ tests ordered today include:   No orders of the defined types were placed in this encounter.   Disposition:   FU with me in 6 months    Mertie Moores, MD  10/18/2021 2:54 PM    [facility] [place] [place], [place]  [address] Phone: [phone]; Fax: [phone]

## 2021-10-29 DIAGNOSIS — I5032 Chronic diastolic (congestive) heart failure: Secondary | ICD-10-CM | POA: Diagnosis not present

## 2021-10-29 DIAGNOSIS — I5041 Acute combined systolic (congestive) and diastolic (congestive) heart failure: Secondary | ICD-10-CM | POA: Diagnosis not present

## 2021-10-29 DIAGNOSIS — I159 Secondary hypertension, unspecified: Secondary | ICD-10-CM | POA: Diagnosis not present

## 2021-11-06 ENCOUNTER — Other Ambulatory Visit: Payer: 59

## 2021-11-15 ENCOUNTER — Telehealth: Payer: Self-pay | Admitting: Cardiovascular Disease

## 2021-11-15 ENCOUNTER — Encounter (HOSPITAL_BASED_OUTPATIENT_CLINIC_OR_DEPARTMENT_OTHER): Payer: Self-pay

## 2021-11-15 ENCOUNTER — Emergency Department (HOSPITAL_BASED_OUTPATIENT_CLINIC_OR_DEPARTMENT_OTHER): Payer: 59

## 2021-11-15 ENCOUNTER — Other Ambulatory Visit: Payer: Self-pay

## 2021-11-15 ENCOUNTER — Inpatient Hospital Stay (HOSPITAL_BASED_OUTPATIENT_CLINIC_OR_DEPARTMENT_OTHER)
Admission: EM | Admit: 2021-11-15 | Discharge: 2021-11-16 | DRG: 305 | Disposition: A | Discharge status: Home / Self Care | Payer: 59 | Attending: Internal Medicine | Admitting: Internal Medicine

## 2021-11-15 DIAGNOSIS — I110 Hypertensive heart disease with heart failure: Secondary | ICD-10-CM | POA: Diagnosis not present

## 2021-11-15 DIAGNOSIS — Z7984 Long term (current) use of oral hypoglycemic drugs: Secondary | ICD-10-CM

## 2021-11-15 DIAGNOSIS — K219 Gastro-esophageal reflux disease without esophagitis: Secondary | ICD-10-CM | POA: Diagnosis present

## 2021-11-15 DIAGNOSIS — E785 Hyperlipidemia, unspecified: Secondary | ICD-10-CM | POA: Diagnosis present

## 2021-11-15 DIAGNOSIS — Z90710 Acquired absence of both cervix and uterus: Secondary | ICD-10-CM

## 2021-11-15 DIAGNOSIS — Z23 Encounter for immunization: Secondary | ICD-10-CM | POA: Diagnosis not present

## 2021-11-15 DIAGNOSIS — Z808 Family history of malignant neoplasm of other organs or systems: Secondary | ICD-10-CM

## 2021-11-15 DIAGNOSIS — R519 Headache, unspecified: Secondary | ICD-10-CM | POA: Diagnosis not present

## 2021-11-15 DIAGNOSIS — I43 Cardiomyopathy in diseases classified elsewhere: Secondary | ICD-10-CM | POA: Diagnosis not present

## 2021-11-15 DIAGNOSIS — E669 Obesity, unspecified: Secondary | ICD-10-CM | POA: Diagnosis not present

## 2021-11-15 DIAGNOSIS — Z6841 Body Mass Index (BMI) 40.0 and over, adult: Secondary | ICD-10-CM

## 2021-11-15 DIAGNOSIS — Z79899 Other long term (current) drug therapy: Secondary | ICD-10-CM | POA: Diagnosis not present

## 2021-11-15 DIAGNOSIS — Z20822 Contact with and (suspected) exposure to covid-19: Secondary | ICD-10-CM | POA: Diagnosis present

## 2021-11-15 DIAGNOSIS — I160 Hypertensive urgency: Secondary | ICD-10-CM | POA: Diagnosis not present

## 2021-11-15 DIAGNOSIS — G4733 Obstructive sleep apnea (adult) (pediatric): Secondary | ICD-10-CM | POA: Diagnosis not present

## 2021-11-15 DIAGNOSIS — I509 Heart failure, unspecified: Secondary | ICD-10-CM | POA: Diagnosis not present

## 2021-11-15 DIAGNOSIS — F17210 Nicotine dependence, cigarettes, uncomplicated: Secondary | ICD-10-CM | POA: Diagnosis present

## 2021-11-15 LAB — BASIC METABOLIC PANEL
Anion gap: 6 (ref 5–15)
BUN: 16 mg/dL (ref 6–20)
CO2: 33 mmol/L — ABNORMAL HIGH (ref 22–32)
Calcium: 9.7 mg/dL (ref 8.9–10.3)
Chloride: 101 mmol/L (ref 98–111)
Creatinine, Ser: 0.83 mg/dL (ref 0.44–1.00)
GFR, Estimated: >60 mL/min (ref >60)
Glucose, Bld: 109 mg/dL — ABNORMAL HIGH (ref 70–99)
Potassium: 3.9 mmol/L (ref 3.5–5.1)
Sodium: 140 mmol/L (ref 135–145)

## 2021-11-15 LAB — CBC WITH DIFFERENTIAL/PLATELET
Abs Immature Granulocytes: 0.04 10*3/uL (ref 0.00–0.07)
Basophils Absolute: 0 10*3/uL (ref 0.0–0.1)
Basophils Relative: 0 %
Eosinophils Absolute: 0.2 10*3/uL (ref 0.0–0.5)
Eosinophils Relative: 2 %
HCT: 47.2 % — ABNORMAL HIGH (ref 36.0–46.0)
Hemoglobin: 14.3 g/dL (ref 12.0–15.0)
Immature Granulocytes: 0 %
Lymphocytes Relative: 20 %
Lymphs Abs: 2 10*3/uL (ref 0.7–4.0)
MCH: 26.2 pg (ref 26.0–34.0)
MCHC: 30.3 g/dL (ref 30.0–36.0)
MCV: 86.6 fL (ref 80.0–100.0)
Monocytes Absolute: 0.8 10*3/uL (ref 0.1–1.0)
Monocytes Relative: 8 %
Neutro Abs: 6.9 10*3/uL (ref 1.7–7.7)
Neutrophils Relative %: 70 %
Platelets: 201 10*3/uL (ref 150–400)
RBC: 5.45 MIL/uL — ABNORMAL HIGH (ref 3.87–5.11)
RDW: 13.2 % (ref 11.5–15.5)
WBC: 10 10*3/uL (ref 4.0–10.5)
nRBC: 0 % (ref 0.0–0.2)

## 2021-11-15 LAB — URINALYSIS, ROUTINE W REFLEX MICROSCOPIC
Bilirubin Urine: NEGATIVE
Glucose, UA: NEGATIVE mg/dL
Hgb urine dipstick: NEGATIVE
Ketones, ur: NEGATIVE mg/dL
Leukocytes,Ua: NEGATIVE
Nitrite: NEGATIVE
Protein, ur: 30 mg/dL — AB
Specific Gravity, Urine: 1.013 (ref 1.005–1.030)
pH: 6.5 (ref 5.0–8.0)

## 2021-11-15 LAB — BRAIN NATRIURETIC PEPTIDE: B Natriuretic Peptide: 45.6 pg/mL (ref 0.0–100.0)

## 2021-11-15 LAB — PREGNANCY, URINE: Preg Test, Ur: NEGATIVE

## 2021-11-15 LAB — RESP PANEL BY RT-PCR (FLU A&B, COVID) ARPGX2
Influenza A by PCR: NEGATIVE
Influenza B by PCR: NEGATIVE
SARS Coronavirus 2 by RT PCR: NEGATIVE

## 2021-11-15 LAB — TROPONIN I (HIGH SENSITIVITY)
Troponin I (High Sensitivity): 18 ng/L — ABNORMAL HIGH (ref <18)
Troponin I (High Sensitivity): 21 ng/L — ABNORMAL HIGH (ref <18)

## 2021-11-15 MED ORDER — HYDRALAZINE HCL 20 MG/ML IJ SOLN
10.0000 mg | Freq: Once | INTRAMUSCULAR | Status: AC
  Administered 2021-11-15: 10 mg via INTRAVENOUS
  Filled 2021-11-15: qty 1

## 2021-11-15 MED ORDER — HYDRALAZINE HCL 25 MG PO TABS: 25.0000 mg | ORAL_TABLET | Freq: Once | ORAL | Status: DC

## 2021-11-15 MED ORDER — NICARDIPINE HCL IN NACL 20-0.86 MG/200ML-% IV SOLN
3.0000 mg/h | INTRAVENOUS | Status: DC
Start: 2021-11-15 — End: 2021-11-16
  Filled 2021-11-15: qty 200

## 2021-11-15 MED ORDER — CARVEDILOL 12.5 MG PO TABS
25.0000 mg | ORAL_TABLET | Freq: Once | ORAL | Status: AC
  Administered 2021-11-15: 25 mg via ORAL
  Filled 2021-11-15: qty 2

## 2021-11-15 MED ORDER — METOCLOPRAMIDE HCL 5 MG/ML IJ SOLN
10.0000 mg | Freq: Once | INTRAMUSCULAR | Status: AC
  Administered 2021-11-15: 10 mg via INTRAVENOUS
  Filled 2021-11-15: qty 2

## 2021-11-15 MED ORDER — DIPHENHYDRAMINE HCL 50 MG/ML IJ SOLN
50.0000 mg | Freq: Once | INTRAMUSCULAR | Status: AC
  Administered 2021-11-15: 50 mg via INTRAVENOUS
  Filled 2021-11-15: qty 1

## 2021-11-15 NOTE — Telephone Encounter (Signed)
Attempted phone call to pt and left voicemail message to contact triage at [phone]. 

## 2021-11-15 NOTE — Telephone Encounter (Signed)
Spoke with the pt and her BP now is 214/116 and she has a headache and has not felt well all day... per Dr. Eden Emms the DOD pt to go to the ED for assessment and she agreed.

## 2021-11-15 NOTE — ED Provider Notes (Signed)
[facility] EMERGENCY DEPT Provider Note   CSN: [id number] Arrival date & time: 11/15/21  [id number]     History Chief Complaint  Patient presents with   Hypertension    Amber Bentley is a 45 y.o. female.  HPI  Patient with history of hypertension, CHF, cardiomegaly, prediabetes presents due to hypertension.  Blood pressures been elevated all day despite medical compliance.  She took an extra carvedilol, blood pressure did not improve.  It was of 200/100 all throughout the day despite medical complaints.  She is not having any chest pain at the moment, had some chest pain a few days ago.  She is been having 3 headaches in the last week, they come on suddenly.  No associated nausea, vomiting, photophobia, photophobia.  No blurry vision, no history of migraines.  Called her cardiologist who advised to go to the ED for IV antihypertensive medicine.  Past Medical History:  Diagnosis Date   Anemia    Anxiety    Cardiomegaly    Chest pain    CHF (congestive heart failure) (HCC)    Constipation    Depression    Dyspnea    Fatigue    Food allergy    celery   GERD (gastroesophageal reflux disease)    Hip pain    HLD (hyperlipidemia)    HTN (hypertension)    Infertility, female    Lactose intolerance    Leg edema    Lower back pain    OSA on CPAP    Palpitations    Prediabetes    Shoulder pain    Stomach ulcer    Varicose vein of leg     Patient Active Problem List   Diagnosis Date Noted   Hypertensive urgency 11/15/2021   Vitamin D deficiency 01/14/2018   Prediabetes 01/14/2018   Essential hypertension 07/30/2017   OSA (obstructive sleep apnea) 07/20/2017   Nasal sore 07/18/2017   Chronic diastolic CHF (congestive heart failure) ([place]) 03/01/2017   Pulmonary hypertension ([place]) 03/01/2017   Morbid obesity (HCC)    SOB (shortness of breath)    Migraine without status migrainosus, not intractable    Altered mental state    Hypertensive cardiomyopathy,  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[place])    Congestive heart failure ([place])    Pulmonary edema    Malignant hypertensive urgency 02/22/2017   HTN (hypertension), benign 02/21/2017   Adjustment disorder with mixed anxiety and depressed mood 02/21/2017   Acute combined systolic and diastolic congestive heart failure ([place]) 02/21/2017   Hypertensive emergency 02/21/2017   Tobacco abuse 02/21/2017   Chest pain 02/21/2017   Anxiety and depression 02/21/2017    Past Surgical History:  Procedure Laterality Date   ABDOMINAL HYSTERECTOMY     ENDOMETRIAL ABLATION  2007   prior to hysterectomy     OB History   No obstetric history on file.     Family History  Problem Relation Age of Onset   Hypertension Mother    Cancer Mother        breast cancer   Kidney disease Mother    Depression Mother    Obesity Mother    Breast cancer Mother    Diabetes Father    Heart disease Father 94   Hyperlipidemia Father    Hypertension Father    Depression Father    Anxiety disorder Father    Sleep apnea Father    Obesity Father    Thyroid disease Sister    Sudden death Brother    Cancer  Maternal Aunt        breast cancer   Breast cancer Maternal Aunt        in 23's   Cancer Paternal Grandfather        brain cancer   Cancer Maternal Grandmother        breast   Breast cancer Maternal Grandmother    Breast cancer Paternal Grandmother     Social History   Tobacco Use   Smoking status: Every Day    Packs/day: 0.25    Years: 10.00    Pack years: 2.50    Types: Cigarettes   Smokeless tobacco: Never  Vaping Use   Vaping Use: Never used  Substance Use Topics   Alcohol use: Yes    Comment: rarely   Drug use: No    Home Medications Prior to Admission medications   Medication Sig Start Date End Date Taking? Authorizing Provider  carvedilol (COREG) 25 MG tablet Take 1 tablet (25 mg total) by mouth 2 (two) times daily. 05/12/20   Nahser, Wonda Cheng, MD  chlorhexidine (HIBICLENS) 4 % external liquid  APPLY TOPICALLY DAILY AS NEEDED. Patient not taking: Reported on 10/18/2021 02/07/21 02/07/22  Volney American, PA-C  furosemide (LASIX) 20 MG tablet Take 1 tablet (20 mg total) by mouth daily as needed for fluid or edema. 06/12/21 09/10/21  Nahser, Wonda Cheng, MD  hydrALAZINE (APRESOLINE) 25 MG tablet Take 1 tablet (25 mg total) by mouth 2 (two) times daily. Please keep upcoming appointment in [place]  For future refills. Thank you 09/07/21   Nahser, Wonda Cheng, MD  losartan (COZAAR) 100 MG tablet Take 1 tablet (100 mg total) by mouth daily. 09/02/19   Kathyrn Drown D, NP  metFORMIN (GLUCOPHAGE) 500 MG tablet TAKE 1 TABLET BY MOUTH ONCE DAILY WITH BREAKFAST 06/28/20 06/28/21  Laqueta Linden, MD  pantoprazole (PROTONIX) 40 MG tablet TAKE 1 TABLET (40 MG TOTAL) BY MOUTH DAILY. 08/22/21   Nahser, Wonda Cheng, MD  rosuvastatin (CRESTOR) 10 MG tablet TAKE 1 TABLET BY MOUTH ONCE DAILY 06/28/20   Nahser, Wonda Cheng, MD  spironolactone (ALDACTONE) 50 MG tablet Take 1 tablet (50 mg total) by mouth daily. 10/18/21   Nahser, Wonda Cheng, MD  sulfamethoxazole-trimethoprim (BACTRIM DS) 800-160 MG tablet TAKE 1 TABLET BY MOUTH 2 TIMES DAILY FOR 10 DAYS Patient not taking: Reported on 10/18/2021 02/07/21 02/07/22  Volney American, PA-C  Vitamin D, Ergocalciferol, (DRISDOL) 1.25 MG (50000 UNIT) CAPS capsule Take 1 capsule (50,000 Units total) by mouth every 7 (seven) days. Patient not taking: Reported on 10/18/2021 04/19/20   Laqueta Linden, MD    Allergies    Food and Aleve [naproxen]  Review of Systems   Review of Systems  Constitutional:  Negative for chills and fever.  HENT:  Negative for ear pain and sore throat.   Eyes:  Negative for pain and visual disturbance.  Respiratory:  Negative for cough and shortness of breath.   Cardiovascular:  Positive for chest pain. Negative for palpitations.  Gastrointestinal:  Negative for abdominal pain and vomiting.  Genitourinary:  Negative for dysuria and hematuria.   Skin:  Negative for color change and rash.  Neurological:  Positive for headaches. Negative for seizures and syncope.  All other systems reviewed and are negative.  Physical Exam Updated Vital Signs BP (!) 177/74   Pulse 81   Temp 98.1 F (36.7 C) (Oral)   Resp (!) 23   Ht 5\' 7"  (1.702 m)   Wt (!) 155.5  kg   SpO2 97%   BMI 53.69 kg/m   Physical Exam Vitals and nursing note reviewed. Exam conducted with a chaperone present.  Constitutional:      Appearance: Normal appearance. She is obese.  HENT:     Head: Normocephalic and atraumatic.  Eyes:     General: No scleral icterus.       Right eye: No discharge.        Left eye: No discharge.     Extraocular Movements: Extraocular movements intact.     Pupils: Pupils are equal, round, and reactive to light.  Cardiovascular:     Rate and Rhythm: Normal rate and regular rhythm.     Pulses: Normal pulses.     Heart sounds: Normal heart sounds. No murmur heard.   No friction rub. No gallop.  Pulmonary:     Effort: Pulmonary effort is normal. No respiratory distress.     Breath sounds: Normal breath sounds.  Abdominal:     General: Abdomen is flat. Bowel sounds are normal. There is no distension.     Palpations: Abdomen is soft.     Tenderness: There is no abdominal tenderness.  Musculoskeletal:     Right lower leg: No edema.     Left lower leg: No edema.     Comments: Lower extremities are equal bilaterally, no edema.  Skin:    General: Skin is warm and dry.     Coloration: Skin is not jaundiced.  Neurological:     Mental Status: She is alert. Mental status is at baseline.     Coordination: Coordination normal.     Comments: CN III-XII grossly in tact. Grip strength equal bilaterally. No nystagmus.     ED Results / Procedures / Treatments   Labs (all labs ordered are listed, but only abnormal results are displayed) Labs Reviewed  BASIC METABOLIC PANEL - Abnormal; Notable for the following components:      Result Value    CO2 33 (*)    Glucose, Bld 109 (*)    All other components within normal limits  CBC WITH DIFFERENTIAL/PLATELET - Abnormal; Notable for the following components:   RBC 5.45 (*)    HCT 47.2 (*)    All other components within normal limits  URINALYSIS, ROUTINE W REFLEX MICROSCOPIC - Abnormal; Notable for the following components:   Protein, ur 30 (*)    All other components within normal limits  TROPONIN I (HIGH SENSITIVITY) - Abnormal; Notable for the following components:   Troponin I (High Sensitivity) 18 (*)    All other components within normal limits  RESP PANEL BY RT-PCR (FLU A&B, COVID) ARPGX2  BRAIN NATRIURETIC PEPTIDE  PREGNANCY, URINE  TROPONIN I (HIGH SENSITIVITY)    EKG EKG Interpretation  Date/Time:  Wednesday November 15 2021 19:28:05 EST Ventricular Rate:  69 PR Interval:  191 QRS Duration: 96 QT Interval:  404 QTC Calculation: 433 R Axis:   82 Text Interpretation: Sinus rhythm Consider left atrial enlargement Probable LVH with secondary repol abnrm Anterior ST elevation, probably due to LVH Inferior TWI noted on prior tracing  3/18 Confirmed by Tanda Rockers ([id number]) on 11/15/2021 8:24:39 PM  Radiology CT Head Wo Contrast  Result Date: 11/15/2021 CLINICAL DATA:  A 45 year old female presents with uncontrolled hypertension and headaches. EXAM: CT HEAD WITHOUT CONTRAST TECHNIQUE: Contiguous axial images were obtained from the base of the skull through the vertex without intravenous contrast. COMPARISON:  February 24, 2017. FINDINGS: Brain: No evidence of acute infarction, hemorrhage,  hydrocephalus, extra-axial collection or mass lesion/mass effect. Vascular: No hyperdense vessel or unexpected calcification. Skull: Normal. Negative for fracture or focal lesion. Sinuses/Orbits: Visualized paranasal sinuses and orbits are unremarkable. Other: None IMPRESSION: No acute intracranial abnormality. Electronically Signed   By: Zetta Bills M.D.   On: 11/15/2021 19:51     Procedures Procedures   Medications Ordered in ED Medications  nicardipine (CARDENE) 20mg  in 0.86% saline 280ml IV infusion (0.1 mg/ml) (has no administration in time range)  hydrALAZINE (APRESOLINE) injection 10 mg (10 mg Intravenous Given 11/15/21 1951)  metoCLOPramide (REGLAN) injection 10 mg (10 mg Intravenous Given 11/15/21 1954)  diphenhydrAMINE (BENADRYL) injection 50 mg (50 mg Intravenous Given 11/15/21 1952)  carvedilol (COREG) tablet 25 mg (25 mg Oral Given 11/15/21 2038)    ED Course  I have reviewed the triage vital signs and the nursing notes.  Pertinent labs & imaging results that were available during my care of the patient were reviewed by me and considered in my medical decision making (see chart for details).    MDM Rules/Calculators/A&P                           Patient is hypertensive, vitals otherwise stable.  No focal deficits on neuro exam, no pitting edema or rails concerning for CHF exacerbation.  BNP ordered, also not elevated making CHF exacerbation less likely.  She has had some intermittent chest pain, currently is asymptomatic.  We will check an initial troponin.  CT head ordered to rule evaluate for [facility], it was negative.  BMP is without any signs of renal impairment, CBC without any leukocytosis or anemia.  Urine does show slight proteinuria but no ketones.  EKG shows LVH, not concerned with ACS.  Initial troponin is 18, will get a second.  She was given hydralazine and Coreg for blood pressure, blood pressure did not significantly budge.  Patient needs admission for hypertensive urgency, started on nicardipine drip.  Spoke with Dr. Tonie Griffith who agrees with admission.  Appreciate his consult.  Final Clinical Impression(s) / ED Diagnoses Final diagnoses:  Hypertensive urgency    Rx / DC Orders ED Discharge Orders     None        Sherrill Raring, [place] 11/15/21 [id number]    Wynona Dove A, DO 11/16/21 0003

## 2021-11-15 NOTE — Telephone Encounter (Signed)
Pt c/o BP issue: STAT if pt c/o blurred vision, one-sided weakness or slurred speech  1. What are your last 5 BP readings? 11 am 207/107, 3 pm 175/105 took extra carvediolol, 3:40 pm 225/112, now 170/90 on her wife's cuff   2. Are you having any other symptoms (ex. Dizziness, headache, blurred vision, passed out)? Earlier felt weird felt shallow weird heart beat, a little SOB and groggy, no symptoms now  3. What is your BP issue? Patient states her BP has been very high today. She says earlier she felt weird, but feels normal now. She says she was feeling a "shallow" heart beat and was a little SOB and groggy. She says she was at work so she rested for 20 minutes in her boss's office and felt better, but not great. She states she went home and went to bed until around 2 pm today. She also states she has been out of her rosuvastatin for about a week. She says she requested it on mychart, but has not heart anything back.

## 2021-11-15 NOTE — ED Notes (Signed)
Called [facility] back to advised patient will not be able to drive herself, [facility] will need to provided transportation

## 2021-11-15 NOTE — ED Notes (Signed)
Called [facility] back after nurse stated patient was driving herself--patient will not be able to drive herself, [facility] will be providing transportation

## 2021-11-15 NOTE — ED Notes (Signed)
Pt refusing for [name] to start the Cardene drip. Pt states "I don't want to pay for [facility] to manage a drip, they can start it when I get to [employer]". Dr Wallace Cullens notified.

## 2021-11-15 NOTE — ED Triage Notes (Signed)
Patient here POV from Home with HTN.  Patient states this past week she has been difficulty controlling her Chronic HTN and today was more severe.   Patient does report she has had two headaches this Past week and a headache today as well.  NAD Noted during Triage. No Neurological Deficits. Ambulatory. A&Ox4. GCS 15.

## 2021-11-15 NOTE — Telephone Encounter (Signed)
Pt returning call to Triage.  

## 2021-11-15 NOTE — ED Notes (Incomplete)
[facility] called to transport patient to [facility] room

## 2021-11-15 NOTE — ED Notes (Signed)
Pt refusing to be transported by ambulance. Dr Wallace Cullens at bedside to speak with patient. IV left intact for admission to [facility] per Dr Wallace Cullens.

## 2021-11-16 ENCOUNTER — Other Ambulatory Visit (HOSPITAL_COMMUNITY): Payer: Self-pay

## 2021-11-16 ENCOUNTER — Encounter (HOSPITAL_COMMUNITY): Payer: Self-pay | Admitting: Family Medicine

## 2021-11-16 DIAGNOSIS — I160 Hypertensive urgency: Secondary | ICD-10-CM | POA: Diagnosis not present

## 2021-11-16 LAB — BASIC METABOLIC PANEL
Anion gap: 4 — ABNORMAL LOW (ref 5–15)
BUN: 20 mg/dL (ref 6–20)
CO2: 29 mmol/L (ref 22–32)
Calcium: 8.6 mg/dL — ABNORMAL LOW (ref 8.9–10.3)
Chloride: 105 mmol/L (ref 98–111)
Creatinine, Ser: 0.98 mg/dL (ref 0.44–1.00)
GFR, Estimated: >60 mL/min (ref >60)
Glucose, Bld: 117 mg/dL — ABNORMAL HIGH (ref 70–99)
Potassium: 3.9 mmol/L (ref 3.5–5.1)
Sodium: 138 mmol/L (ref 135–145)

## 2021-11-16 LAB — HIV ANTIBODY (ROUTINE TESTING W REFLEX): HIV Screen 4th Generation wRfx: NONREACTIVE

## 2021-11-16 LAB — CBC
HCT: 45.7 % (ref 36.0–46.0)
Hemoglobin: 14 g/dL (ref 12.0–15.0)
MCH: 26.6 pg (ref 26.0–34.0)
MCHC: 30.6 g/dL (ref 30.0–36.0)
MCV: 86.9 fL (ref 80.0–100.0)
Platelets: 181 10*3/uL (ref 150–400)
RBC: 5.26 MIL/uL — ABNORMAL HIGH (ref 3.87–5.11)
RDW: 13.2 % (ref 11.5–15.5)
WBC: 9.4 10*3/uL (ref 4.0–10.5)
nRBC: 0 % (ref 0.0–0.2)

## 2021-11-16 LAB — MRSA NEXT GEN BY PCR, NASAL: MRSA by PCR Next Gen: NOT DETECTED

## 2021-11-16 LAB — MAGNESIUM: Magnesium: 2.1 mg/dL (ref 1.7–2.4)

## 2021-11-16 MED ORDER — ROSUVASTATIN CALCIUM 10 MG PO TABS: 10.0000 mg | ORAL_TABLET | Freq: Every day | ORAL | Status: DC

## 2021-11-16 MED ORDER — INFLUENZA VAC SPLIT QUAD 0.5 ML IM SUSY
0.5000 mL | PREFILLED_SYRINGE | INTRAMUSCULAR | Status: AC
  Administered 2021-11-16: 0.5 mL via INTRAMUSCULAR
  Filled 2021-11-16: qty 0.5

## 2021-11-16 MED ORDER — CARVEDILOL 25 MG PO TABS
25.0000 mg | ORAL_TABLET | Freq: Two times a day (BID) | ORAL | 3 refills | Status: DC
  Filled 2021-11-16 – 2021-12-19 (×2): qty 180, 90d supply, fill #0
  Filled 2022-08-13: qty 180, 90d supply, fill #1
  Filled 2022-11-10: qty 180, 90d supply, fill #2

## 2021-11-16 MED ORDER — HYDRALAZINE HCL 25 MG PO TABS
50.0000 mg | ORAL_TABLET | Freq: Three times a day (TID) | ORAL | 2 refills | Status: DC
  Filled 2021-11-16 – 2022-03-12 (×2): qty 90, 15d supply, fill #0

## 2021-11-16 MED ORDER — VITAMIN B-12 1000 MCG PO TABS: 1000.0000 ug | ORAL_TABLET | Freq: Every day | ORAL | Status: DC

## 2021-11-16 MED ORDER — SODIUM CHLORIDE 0.9% FLUSH: 3.0000 mL | Freq: Two times a day (BID) | INTRAVENOUS | Status: DC

## 2021-11-16 MED ORDER — HYDRALAZINE HCL 25 MG PO TABS
25.0000 mg | ORAL_TABLET | Freq: Two times a day (BID) | ORAL | Status: DC
  Administered 2021-11-16: 25 mg via ORAL
  Filled 2021-11-16: qty 1

## 2021-11-16 MED ORDER — CARVEDILOL 12.5 MG PO TABS
25.0000 mg | ORAL_TABLET | Freq: Two times a day (BID) | ORAL | Status: DC
  Administered 2021-11-16: 25 mg via ORAL
  Filled 2021-11-16: qty 2

## 2021-11-16 MED ORDER — LOSARTAN POTASSIUM 50 MG PO TABS: 100.0000 mg | ORAL_TABLET | Freq: Every day | ORAL | Status: DC

## 2021-11-16 MED ORDER — SPIRONOLACTONE 25 MG PO TABS
50.0000 mg | ORAL_TABLET | Freq: Every day | ORAL | Status: DC
  Administered 2021-11-16: 50 mg via ORAL
  Filled 2021-11-16: qty 2

## 2021-11-16 MED ORDER — PANTOPRAZOLE SODIUM 40 MG PO TBEC
40.0000 mg | DELAYED_RELEASE_TABLET | Freq: Every day | ORAL | Status: DC
  Administered 2021-11-16: 40 mg via ORAL
  Filled 2021-11-16: qty 1

## 2021-11-16 MED ORDER — HYDROCODONE-ACETAMINOPHEN 5-325 MG PO TABS: 1.0000 | ORAL_TABLET | Freq: Four times a day (QID) | ORAL | Status: DC | PRN

## 2021-11-16 MED ORDER — CHLORHEXIDINE GLUCONATE CLOTH 2 % EX PADS: 6.0000 | MEDICATED_PAD | Freq: Every day | CUTANEOUS | Status: DC

## 2021-11-16 MED ORDER — FUROSEMIDE 20 MG PO TABS
20.0000 mg | ORAL_TABLET | Freq: Every day | ORAL | 2 refills | Status: DC
  Filled 2021-11-16: qty 30, 30d supply, fill #0

## 2021-11-16 MED ORDER — FUROSEMIDE 20 MG PO TABS
20.0000 mg | ORAL_TABLET | Freq: Every day | ORAL | Status: DC
  Administered 2021-11-16: 20 mg via ORAL
  Filled 2021-11-16: qty 1

## 2021-11-16 MED ORDER — ENOXAPARIN SODIUM 40 MG/0.4ML IJ SOSY: 40.0000 mg | PREFILLED_SYRINGE | INTRAMUSCULAR | Status: DC

## 2021-11-16 MED ORDER — LABETALOL HCL 5 MG/ML IV SOLN
10.0000 mg | INTRAVENOUS | Status: DC | PRN
  Administered 2021-11-16: 10 mg via INTRAVENOUS
  Filled 2021-11-16: qty 4

## 2021-11-16 MED ORDER — ACETAMINOPHEN 325 MG PO TABS: 650.0000 mg | ORAL_TABLET | Freq: Four times a day (QID) | ORAL | Status: DC | PRN

## 2021-11-16 NOTE — H&P (Signed)
History and Physical    Amber Bentley ZOX:[id number] DOB: May 25, 1976 DOA: 11/15/2021  PCP: Anne Ng, NP   Patient coming from: Home   Chief Complaint: High BP   HPI: Amber Bentley is a very pleasant 45 y.o. adult with medical history significant for resistant hypertension with hypertensive cardiomyopathy, OSA no longer using CPAP, and BMI 54, now presenting to the emergency department for evaluation of severe hypertension.  Patient reports that her blood pressure has been difficult to control despite adherence with her medications, she had systolic pressure over 200 today, tried to relax, continued to have elevated blood pressures, and eventually took an extra dose of carvedilol.  She continued to have SBP over 200, called her cardiology clinic, and was advised to seek further evaluation and treatment in the ED.  She reports having some headaches over the past week or so including today.  She denies any change in vision or hearing or focal numbness or weakness.  She denies any chest pain.  [facility] ED Course: Upon arrival to the ED, patient is found to be afebrile, saturating well on room air, and hypertensive to 209/76.  EKG features sinus rhythm with LVH and repolarization abnormality.  Head CT was negative for acute intracranial abnormality.  Chemistry panel with bicarbonate of 33.  High-sensitivity troponin was 18 and then 21.  BNP was normal.  COVID and influenza was negative.  Patient was treated with Benadryl, Reglan, carvedilol, and IV hydralazine in the ED.  She remained hypertensive, Cardene infusion was ordered but never started, and she was sent to [facility] for admission.  Review of Systems:  All other systems reviewed and apart from HPI, are negative.  Past Medical History:  Diagnosis Date   Anemia    Anxiety    Cardiomegaly    Chest pain    CHF (congestive heart failure) (HCC)    Constipation    Depression    Dyspnea    Fatigue    Food  allergy    celery   GERD (gastroesophageal reflux disease)    Hip pain    HLD (hyperlipidemia)    HTN (hypertension)    Infertility, female    Lactose intolerance    Leg edema    Lower back pain    OSA on CPAP    Palpitations    Prediabetes    Shoulder pain    Stomach ulcer    Varicose vein of leg     Past Surgical History:  Procedure Laterality Date   ABDOMINAL HYSTERECTOMY     ENDOMETRIAL ABLATION  2007   prior to hysterectomy    Social History:   reports that Amber Bentley has been smoking cigarettes. Amber Bentley has a 2.50 pack-year smoking history. Amber Bentley has never used smokeless tobacco. Amber Bentley reports current alcohol use. Amber Bentley reports that Amber Bentley does not use drugs.  Allergies  Allergen Reactions   Food Anaphylaxis and Other (See Comments)    Pt is allergic to celery.    Aleve [Naproxen] Other (See Comments)    Pt states that this medication makes her loopy.     Family History  Problem Relation Age of Onset   Hypertension Mother    Cancer Mother        breast cancer   Kidney disease Mother    Depression Mother    Obesity Mother    Breast cancer Mother    Diabetes Father  Heart disease Father 53   Hyperlipidemia Father    Hypertension Father    Depression Father    Anxiety disorder Father    Sleep apnea Father    Obesity Father    Thyroid disease Sister    Sudden death Brother    Cancer Maternal Aunt        breast cancer   Breast cancer Maternal Aunt        in 55's   Cancer Paternal Grandfather        brain cancer   Cancer Maternal Grandmother        breast   Breast cancer Maternal Grandmother    Breast cancer Paternal Grandmother      Prior to Admission medications   Medication Sig Start Date End Date Taking? Authorizing Provider  carvedilol (COREG) 25 MG tablet Take 1 tablet (25 mg total) by mouth 2 (two) times daily. 05/12/20   Nahser, Deloris Ping, MD  chlorhexidine (HIBICLENS) 4 % external liquid  APPLY TOPICALLY DAILY AS NEEDED. Patient not taking: Reported on 10/18/2021 02/07/21 02/07/22  Particia Nearing, PA-C  furosemide (LASIX) 20 MG tablet Take 1 tablet (20 mg total) by mouth daily as needed for fluid or edema. 06/12/21 09/10/21  Nahser, Deloris Ping, MD  hydrALAZINE (APRESOLINE) 25 MG tablet Take 1 tablet (25 mg total) by mouth 2 (two) times daily. Please keep upcoming appointment in Nov.2022  For future refills. Thank you 09/07/21   Nahser, Deloris Ping, MD  losartan (COZAAR) 100 MG tablet Take 1 tablet (100 mg total) by mouth daily. 09/02/19   Georgie Chard D, NP  metFORMIN (GLUCOPHAGE) 500 MG tablet TAKE 1 TABLET BY MOUTH ONCE DAILY WITH BREAKFAST 06/28/20 06/28/21  Langston Reusing, MD  pantoprazole (PROTONIX) 40 MG tablet TAKE 1 TABLET (40 MG TOTAL) BY MOUTH DAILY. 08/22/21   Nahser, Deloris Ping, MD  rosuvastatin (CRESTOR) 10 MG tablet TAKE 1 TABLET BY MOUTH ONCE DAILY 06/28/20   Nahser, Deloris Ping, MD  spironolactone (ALDACTONE) 50 MG tablet Take 1 tablet (50 mg total) by mouth daily. 10/18/21   Nahser, Deloris Ping, MD  sulfamethoxazole-trimethoprim (BACTRIM DS) 800-160 MG tablet TAKE 1 TABLET BY MOUTH 2 TIMES DAILY FOR 10 DAYS Patient not taking: Reported on 10/18/2021 02/07/21 02/07/22  Particia Nearing, PA-C  Vitamin D, Ergocalciferol, (DRISDOL) 1.25 MG (50000 UNIT) CAPS capsule Take 1 capsule (50,000 Units total) by mouth every 7 (seven) days. Patient not taking: Reported on 10/18/2021 04/19/20   Langston Reusing, MD    Physical Exam: Vitals:   11/16/21 0016 11/16/21 0100 11/16/21 0200 11/16/21 0300  BP:  (!) 143/55 (!) 160/64 (!) 152/72  Pulse:  72 78 74  Resp:  (!) 23 (!) 21 (!) 23  Temp:      TempSrc:      SpO2:  90% 94% 92%  Weight: (!) 155.9 kg     Height:        Constitutional: NAD, calm  Eyes: PERTLA, lids and conjunctivae normal ENMT: Mucous membranes are moist. Posterior pharynx clear of any exudate or lesions.   Neck: supple, no masses  Respiratory: no wheezing, no  crackles. No accessory muscle use.  Cardiovascular: S1 & S2 heard, regular rate and rhythm. No extremity edema.   Abdomen: No distension, no tenderness, soft. Bowel sounds active.  Musculoskeletal: no clubbing / cyanosis. No joint deformity upper and lower extremities.   Skin: no significant rashes, lesions, ulcers. Warm, dry, well-perfused. Neurologic: CN 2-12 grossly intact. Strength 5/5 in all  4 limbs. Alert and oriented.  Psychiatric: Very pleasant. Cooperative.    Labs and Imaging on Admission: I have personally reviewed following labs and imaging studies  CBC: Recent Labs  Lab 11/15/21 1934  WBC 10.0  NEUTROABS 6.9  HGB 14.3  HCT 47.2*  MCV 86.6  PLT 201   Basic Metabolic Panel: Recent Labs  Lab 11/15/21 1934  NA 140  K 3.9  CL 101  CO2 33*  GLUCOSE 109*  BUN 16  CREATININE 0.83  CALCIUM 9.7   GFR: Estimated Creatinine Clearance (by C-G formula based on SCr of 0.83 mg/dL) Female: 696 mL/min Female: 165.6 mL/min Liver Function Tests: No results for input(s): AST, ALT, ALKPHOS, BILITOT, PROT, ALBUMIN in the last 168 hours. No results for input(s): LIPASE, AMYLASE in the last 168 hours. No results for input(s): AMMONIA in the last 168 hours. Coagulation Profile: No results for input(s): INR, PROTIME in the last 168 hours. Cardiac Enzymes: No results for input(s): CKTOTAL, CKMB, CKMBINDEX, TROPONINI in the last 168 hours. BNP (last 3 results) No results for input(s): PROBNP in the last 8760 hours. HbA1C: No results for input(s): HGBA1C in the last 72 hours. CBG: No results for input(s): GLUCAP in the last 168 hours. Lipid Profile: No results for input(s): CHOL, HDL, LDLCALC, TRIG, CHOLHDL, LDLDIRECT in the last 72 hours. Thyroid Function Tests: No results for input(s): TSH, T4TOTAL, FREET4, T3FREE, THYROIDAB in the last 72 hours. Anemia Panel: No results for input(s): VITAMINB12, FOLATE, FERRITIN, TIBC, IRON, RETICCTPCT in the last 72 hours. Urine  analysis:    Component Value Date/Time   COLORURINE YELLOW 11/15/2021 1938   APPEARANCEUR CLEAR 11/15/2021 1938   LABSPEC 1.013 11/15/2021 1938   PHURINE 6.5 11/15/2021 1938   GLUCOSEU NEGATIVE 11/15/2021 1938   HGBUR NEGATIVE 11/15/2021 1938   BILIRUBINUR NEGATIVE 11/15/2021 1938   KETONESUR NEGATIVE 11/15/2021 1938   PROTEINUR 30 (A) 11/15/2021 1938   NITRITE NEGATIVE 11/15/2021 1938   LEUKOCYTESUR NEGATIVE 11/15/2021 1938   Sepsis Labs: @LABRCNTIP (procalcitonin:4,lacticidven:4) ) Recent Results (from the past 240 hour(s))  Resp Panel by RT-PCR (Flu A&B, Covid) Nasopharyngeal Swab     Status: None   Collection Time: 11/15/21  8:40 PM   Specimen: Nasopharyngeal Swab; Nasopharyngeal(NP) swabs in vial transport medium  Result Value Ref Range Status   SARS Coronavirus 2 by RT PCR NEGATIVE NEGATIVE Final    Comment: (NOTE) SARS-CoV-2 target nucleic acids are NOT DETECTED.  The SARS-CoV-2 RNA is generally detectable in upper respiratory specimens during the acute phase of infection. The lowest concentration of SARS-CoV-2 viral copies this assay can detect is 138 copies/mL. A negative result does not preclude SARS-Cov-2 infection and should not be used as the sole basis for treatment or other patient management decisions. A negative result may occur with  improper specimen collection/handling, submission of specimen other than nasopharyngeal swab, presence of viral mutation(s) within the areas targeted by this assay, and inadequate number of viral copies(<138 copies/mL). A negative result must be combined with clinical observations, patient history, and epidemiological information. The expected result is Negative.  Fact Sheet for Patients:  11/17/21  Fact Sheet for Healthcare Providers:  BloggerCourse.com  This test is no t yet approved or cleared by the [place] FDA and  has been authorized for detection and/or  diagnosis of SARS-CoV-2 by FDA under an Emergency Use Authorization (EUA). This EUA will remain  in effect (meaning this test can be used) for the duration of the COVID-19 declaration under Section 564(b)(1) of the  Act, 21 U.S.C.section 360bbb-3(b)(1), unless the authorization is terminated  or revoked sooner.       Influenza A by PCR NEGATIVE NEGATIVE Final   Influenza B by PCR NEGATIVE NEGATIVE Final    Comment: (NOTE) The Xpert Xpress SARS-CoV-2/FLU/RSV plus assay is intended as an aid in the diagnosis of influenza from Nasopharyngeal swab specimens and should not be used as a sole basis for treatment. Nasal washings and aspirates are unacceptable for Xpert Xpress SARS-CoV-2/FLU/RSV testing.  Fact Sheet for Patients: BloggerCourse.com  Fact Sheet for Healthcare Providers: SeriousBroker.it  This test is not yet approved or cleared by the [place] FDA and has been authorized for detection and/or diagnosis of SARS-CoV-2 by FDA under an Emergency Use Authorization (EUA). This EUA will remain in effect (meaning this test can be used) for the duration of the COVID-19 declaration under Section 564(b)(1) of the Act, 21 U.S.C. section 360bbb-3(b)(1), unless the authorization is terminated or revoked.  Performed at [facility], [address], [place], [place] [address]   MRSA Next Gen by PCR, Nasal     Status: None   Collection Time: 11/15/21 11:54 PM   Specimen: Nasal Mucosa; Nasal Swab  Result Value Ref Range Status   MRSA by PCR Next Gen NOT DETECTED NOT DETECTED Final    Comment: (NOTE) The GeneXpert MRSA Assay (FDA approved for NASAL specimens only), is one component of a comprehensive MRSA colonization surveillance program. It is not intended to diagnose MRSA infection nor to guide or monitor treatment for MRSA infections. Test performance is not FDA approved in patients less than 78  years old. Performed at [facility], [address], [place], [place] [address]      Radiological Exams on Admission: CT Head Wo Contrast  Result Date: 11/15/2021 CLINICAL DATA:  A 45 year old female presents with uncontrolled hypertension and headaches. EXAM: CT HEAD WITHOUT CONTRAST TECHNIQUE: Contiguous axial images were obtained from the base of the skull through the vertex without intravenous contrast. COMPARISON:  February 24, 2017. FINDINGS: Brain: No evidence of acute infarction, hemorrhage, hydrocephalus, extra-axial collection or mass lesion/mass effect. Vascular: No hyperdense vessel or unexpected calcification. Skull: Normal. Negative for fracture or focal lesion. Sinuses/Orbits: Visualized paranasal sinuses and orbits are unremarkable. Other: None IMPRESSION: No acute intracranial abnormality. Electronically Signed   By: Donzetta Kohut M.D.   On: 11/15/2021 19:51    EKG: Independently reviewed. Sinus rhythm, LVH with repolarization abnormality.   Assessment/Plan  1. Hypertensive urgency  - Pt w/ resistant HTN presented to [facility] with SBP >200 despite extra dose of Coreg at home  - She had slightly elevated troponin in ED, remained hypertensive despite IV hydralazine and Coreg in ED  - BP improved en route to [facility] and Cardene infusion was never started  - Continue current regimen of hydralazine, spironolactone, Coreg, losartan, and Lasix, use IV labetalol if needed    2. Hypertensive cardiomyopathy  - EF and diastolic parameters had both normalized on last TTE from Sept 2019  - Continue current treatments and cardiology follow-up   3. OSA  - No longer tolerating CPAP    DVT prophylaxis: Lovenox  Code Status: Full  Level of Care: Level of care: Stepdown Family Communication: None present  Disposition Plan:  Patient is from: home  Anticipated d/c is to: Home  Anticipated d/c date is: 11/16/21  Patient currently: Pending BP control  Consults called: none   Admission status: Inpatient     Briscoe Deutscher, MD [facility]  11/16/2021, 3:46 AM

## 2021-11-16 NOTE — Discharge Summary (Signed)
Physician Discharge Summary  Amber Bentley IDP:[id number] DOB: 1976-11-14 DOA: 11/15/2021  PCP: Anne Ng, NP  Admit date: 11/15/2021 Discharge date: 11/16/2021  Admitted From: Home Disposition: Home  Recommendations for Outpatient Follow-up:  Follow up with PCP in 1-2 weeks Please obtain BMP/CBC in one week Please follow up with cardiology Dr. Lourena Simmonds Home Health:none None Equipment/Devices: None  Discharge Condition: Stable CODE STATUS: Full code Diet recommendation: Cardiac Brief/Interim Summary:Amber Bentley is a very pleasant 45 y.o. adult with medical history significant for resistant hypertension with hypertensive cardiomyopathy, OSA no longer using CPAP, and BMI 54, now presenting to the emergency department for evaluation of severe hypertension.  Patient reports that her blood pressure has been difficult to control despite adherence with her medications, she had systolic pressure over 200 today, tried to relax, continued to have elevated blood pressures, and eventually took an extra dose of carvedilol.  She continued to have SBP over 200, called her cardiology clinic, and was advised to seek further evaluation and treatment in the ED.  She reports having some headaches over the past week or so including today.  She denies any change in vision or hearing or focal numbness or weakness.  She denies any chest pain.   [facility] ED Course: Upon arrival to the ED, patient is found to be afebrile, saturating well on room air, and hypertensive to 209/76.  EKG features sinus rhythm with LVH and repolarization abnormality.  Head CT was negative for acute intracranial abnormality.  Chemistry panel with bicarbonate of 33.  High-sensitivity troponin was 18 and then 21.  BNP was normal.  COVID and influenza was negative.  Patient was treated with Benadryl, Reglan, carvedilol, and IV hydralazine in the ED.  She remained hypertensive, Cardene infusion was ordered but never started,  and she was sent to [facility] for admission Discharge Diagnoses:  Principal Problem:   Hypertensive urgency Active Problems:   Hypertensive cardiomyopathy, 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HCC)   OSA (obstructive sleep apnea)   #1 hypertensive urgency patient reports she was not taking Coreg on a regular basis and was taking Lasix only as needed and was not taking the ACE inhibitor at home.  On discharge she was discharged on Coreg 25 twice daily Lasix 20 mg daily Hydralazine 50 mg 3 times a day Losartan 100 mg daily Aldactone 50 mg daily She was told to check her vital signs at home on a daily basis and to follow-up with cardiology and PCP.  #2 history of hypertensive cardiomyopathy continue above medications.  Estimated body mass index is 53.83 kg/m as calculated from the following:   Height as of this encounter: 5\' 7"  (1.702 m).   Weight as of this encounter: 155.9 kg.  Discharge Instructions  Discharge Instructions     Diet - low sodium heart healthy   Complete by: As directed    Increase activity slowly   Complete by: As directed       Allergies as of 11/16/2021       Reactions   Food Anaphylaxis, Other (See Comments)   Pt is allergic to celery.    Aleve [naproxen] Other (See Comments)   Pt states that this medication makes her loopy.         Medication List     STOP taking these medications    Hibiclens 4 % external liquid Generic drug: chlorhexidine   sulfamethoxazole-trimethoprim 800-160 MG tablet Commonly known as: BACTRIM DS   Vitamin D (Ergocalciferol) 1.25 MG (50000 UNIT) Caps  capsule Commonly known as: DRISDOL       TAKE these medications    carvedilol 25 MG tablet Commonly known as: COREG Take 1 tablet (25 mg total) by mouth 2 (two) times daily with a meal. What changed: when to take this   furosemide 20 MG tablet Commonly known as: LASIX Take 1 tablet (20 mg total) by mouth daily. What changed:  when to take this reasons to take  this   hydrALAZINE 25 MG tablet Commonly known as: APRESOLINE Take 2 tablets (50 mg total) by mouth 3 (three) times daily. What changed:  how much to take when to take this additional instructions   losartan 100 MG tablet Commonly known as: COZAAR Take 1 tablet (100 mg total) by mouth daily.   metFORMIN 500 MG tablet Commonly known as: GLUCOPHAGE TAKE 1 TABLET BY MOUTH ONCE DAILY WITH BREAKFAST   pantoprazole 40 MG tablet Commonly known as: PROTONIX TAKE 1 TABLET (40 MG TOTAL) BY MOUTH DAILY.   rosuvastatin 10 MG tablet Commonly known as: CRESTOR TAKE 1 TABLET BY MOUTH ONCE DAILY What changed: how much to take   spironolactone 50 MG tablet Commonly known as: Aldactone Take 1 tablet (50 mg total) by mouth daily.   vitamin B-12 1000 MCG tablet Commonly known as: CYANOCOBALAMIN Take 1 tablet (1,000 mcg total) by mouth daily.        Follow-up Information     Nahser, Deloris Ping, MD Follow up.   Specialty: Cardiology Contact information: [address] [place] [address] [phone]         Nche, Bonna Gains, NP Follow up.   Specialty: Internal Medicine Contact information: [address] [place] [address] [phone]                Allergies  Allergen Reactions   Food Anaphylaxis and Other (See Comments)    Pt is allergic to celery.    Aleve [Naproxen] Other (See Comments)    Pt states that this medication makes her loopy.     Consultations: None   Procedures/Studies: CT Head Wo Contrast  Result Date: 11/15/2021 CLINICAL DATA:  A 45 year old female presents with uncontrolled hypertension and headaches. EXAM: CT HEAD WITHOUT CONTRAST TECHNIQUE: Contiguous axial images were obtained from the base of the skull through the vertex without intravenous contrast. COMPARISON:  February 24, 2017. FINDINGS: Brain: No evidence of acute infarction, hemorrhage, hydrocephalus, extra-axial collection or mass lesion/mass effect. Vascular:  No hyperdense vessel or unexpected calcification. Skull: Normal. Negative for fracture or focal lesion. Sinuses/Orbits: Visualized paranasal sinuses and orbits are unremarkable. Other: None IMPRESSION: No acute intracranial abnormality. Electronically Signed   By: Donzetta Kohut M.D.   On: 11/15/2021 19:51   (Echo, Carotid, EGD, Colonoscopy, ERCP)    Subjective:  She is resting in bed denies any chest pain shortness of breath nausea vomiting she feels better anxious to go home Discharge Exam: Vitals:   11/16/21 0800 11/16/21 0954  BP: (!) 156/122 (!) 178/85  Pulse: 69   Resp: (!) 23   Temp:    SpO2: 91%    Vitals:   11/16/21 0349 11/16/21 0400 11/16/21 0800 11/16/21 0954  BP:  (!) 156/66 (!) 156/122 (!) 178/85  Pulse:  75 69   Resp:  (!) 23 (!) 23   Temp: 98.2 F (36.8 C)     TempSrc: Axillary     SpO2:  90% 91%   Weight:      Height:  General: Pt is alert, awake, not in acute distress Cardiovascular: RRR, S1/S2 +, no rubs, no gallops Respiratory: CTA bilaterally, no wheezing, no rhonchi Abdominal: Soft, NT, ND, bowel sounds + Extremities: no edema, no cyanosis    The results of significant diagnostics from this hospitalization (including imaging, microbiology, ancillary and laboratory) are listed below for reference.     Microbiology: Recent Results (from the past 240 hour(s))  Resp Panel by RT-PCR (Flu A&B, Covid) Nasopharyngeal Swab     Status: None   Collection Time: 11/15/21  8:40 PM   Specimen: Nasopharyngeal Swab; Nasopharyngeal(NP) swabs in vial transport medium  Result Value Ref Range Status   SARS Coronavirus 2 by RT PCR NEGATIVE NEGATIVE Final    Comment: (NOTE) SARS-CoV-2 target nucleic acids are NOT DETECTED.  The SARS-CoV-2 RNA is generally detectable in upper respiratory specimens during the acute phase of infection. The lowest concentration of SARS-CoV-2 viral copies this assay can detect is 138 copies/mL. A negative result does not preclude  SARS-Cov-2 infection and should not be used as the sole basis for treatment or other patient management decisions. A negative result may occur with  improper specimen collection/handling, submission of specimen other than nasopharyngeal swab, presence of viral mutation(s) within the areas targeted by this assay, and inadequate number of viral copies(<138 copies/mL). A negative result must be combined with clinical observations, patient history, and epidemiological information. The expected result is Negative.  Fact Sheet for Patients:  BloggerCourse.com  Fact Sheet for Healthcare Providers:  SeriousBroker.it  This test is no t yet approved or cleared by the [place] FDA and  has been authorized for detection and/or diagnosis of SARS-CoV-2 by FDA under an Emergency Use Authorization (EUA). This EUA will remain  in effect (meaning this test can be used) for the duration of the COVID-19 declaration under Section 564(b)(1) of the Act, 21 U.S.C.section 360bbb-3(b)(1), unless the authorization is terminated  or revoked sooner.       Influenza A by PCR NEGATIVE NEGATIVE Final   Influenza B by PCR NEGATIVE NEGATIVE Final    Comment: (NOTE) The Xpert Xpress SARS-CoV-2/FLU/RSV plus assay is intended as an aid in the diagnosis of influenza from Nasopharyngeal swab specimens and should not be used as a sole basis for treatment. Nasal washings and aspirates are unacceptable for Xpert Xpress SARS-CoV-2/FLU/RSV testing.  Fact Sheet for Patients: BloggerCourse.com  Fact Sheet for Healthcare Providers: SeriousBroker.it  This test is not yet approved or cleared by the [place] FDA and has been authorized for detection and/or diagnosis of SARS-CoV-2 by FDA under an Emergency Use Authorization (EUA). This EUA will remain in effect (meaning this test can be used) for the duration of  the COVID-19 declaration under Section 564(b)(1) of the Act, 21 U.S.C. section 360bbb-3(b)(1), unless the authorization is terminated or revoked.  Performed at [facility], [address] [place], [place] [address]   MRSA Next Gen by PCR, Nasal     Status: None   Collection Time: 11/15/21 11:54 PM   Specimen: Nasal Mucosa; Nasal Swab  Result Value Ref Range Status   MRSA by PCR Next Gen NOT DETECTED NOT DETECTED Final    Comment: (NOTE) The GeneXpert MRSA Assay (FDA approved for NASAL specimens only), is one component of a comprehensive MRSA colonization surveillance program. It is not intended to diagnose MRSA infection nor to guide or monitor treatment for MRSA infections. Test performance is not FDA approved in patients less than 22 years old. Performed at [facility], [address] [place], [place] [address]      Labs: BNP (last 3 results) Recent Labs    11/15/21 1938  BNP 45.6   Basic Metabolic Panel: Recent Labs  Lab 11/15/21 1934 11/16/21 0524  NA 140 138  K 3.9 3.9  CL 101 105  CO2 33* 29  GLUCOSE 109* 117*  BUN 16 20  CREATININE 0.83 0.98  CALCIUM 9.7 8.6*  MG  --  2.1   Liver Function Tests: No results for input(s): AST, ALT, ALKPHOS, BILITOT, PROT, ALBUMIN in the last 168 hours. No results for input(s): LIPASE, AMYLASE in the last 168 hours. No results for input(s): AMMONIA in the last 168 hours. CBC: Recent Labs  Lab 11/15/21 1934 11/16/21 0524  WBC 10.0 9.4  NEUTROABS 6.9  --   HGB 14.3 14.0  HCT 47.2* 45.7  MCV 86.6 86.9  PLT 201 181   Cardiac Enzymes: No results for input(s): CKTOTAL, CKMB, CKMBINDEX, TROPONINI in the last 168 hours. BNP: Invalid input(s): POCBNP CBG: No results for input(s): GLUCAP in the last 168 hours. D-Dimer No results for input(s): DDIMER in the last 72 hours. Hgb A1c No results for input(s): HGBA1C in the last 72 hours. Lipid Profile No results for input(s): CHOL, HDL,  LDLCALC, TRIG, CHOLHDL, LDLDIRECT in the last 72 hours. Thyroid function studies No results for input(s): TSH, T4TOTAL, T3FREE, THYROIDAB in the last 72 hours.  Invalid input(s): FREET3 Anemia work up No results for input(s): VITAMINB12, FOLATE, FERRITIN, TIBC, IRON, RETICCTPCT in the last 72 hours. Urinalysis    Component Value Date/Time   COLORURINE YELLOW 11/15/2021 1938   APPEARANCEUR CLEAR 11/15/2021 1938   LABSPEC 1.013 11/15/2021 1938   PHURINE 6.5 11/15/2021 1938   GLUCOSEU NEGATIVE 11/15/2021 1938   HGBUR NEGATIVE 11/15/2021 1938   BILIRUBINUR NEGATIVE 11/15/2021 1938   KETONESUR NEGATIVE 11/15/2021 1938   PROTEINUR 30 (A) 11/15/2021 1938   NITRITE NEGATIVE 11/15/2021 1938   LEUKOCYTESUR NEGATIVE 11/15/2021 1938   Sepsis Labs Invalid input(s): PROCALCITONIN,  WBC,  LACTICIDVEN Microbiology Recent Results (from the past 240 hour(s))  Resp Panel by RT-PCR (Flu A&B, Covid) Nasopharyngeal Swab     Status: None   Collection Time: 11/15/21  8:40 PM   Specimen: Nasopharyngeal Swab; Nasopharyngeal(NP) swabs in vial transport medium  Result Value Ref Range Status   SARS Coronavirus 2 by RT PCR NEGATIVE NEGATIVE Final    Comment: (NOTE) SARS-CoV-2 target nucleic acids are NOT DETECTED.  The SARS-CoV-2 RNA is generally detectable in upper respiratory specimens during the acute phase of infection. The lowest concentration of SARS-CoV-2 viral copies this assay can detect is 138 copies/mL. A negative result does not preclude SARS-Cov-2 infection and should not be used as the sole basis for treatment or other patient management decisions. A negative result may occur with  improper specimen collection/handling, submission of specimen other than nasopharyngeal swab, presence of viral mutation(s) within the areas targeted by this assay, and inadequate number of viral copies(<138 copies/mL). A negative result must be combined with clinical observations, patient history, and  epidemiological information. The expected result is Negative.  Fact Sheet for Patients:  BloggerCourse.com  Fact Sheet for Healthcare Providers:  SeriousBroker.it  This test is no t yet approved or cleared by the [place] FDA and  has been authorized for detection and/or diagnosis of SARS-CoV-2 by FDA under an Emergency Use Authorization (EUA). This EUA will remain  in effect (meaning this test can be used) for the duration of  the COVID-19 declaration under Section 564(b)(1) of the Act, 21 U.S.C.section 360bbb-3(b)(1), unless the authorization is terminated  or revoked sooner.       Influenza A by PCR NEGATIVE NEGATIVE Final   Influenza B by PCR NEGATIVE NEGATIVE Final    Comment: (NOTE) The Xpert Xpress SARS-CoV-2/FLU/RSV plus assay is intended as an aid in the diagnosis of influenza from Nasopharyngeal swab specimens and should not be used as a sole basis for treatment. Nasal washings and aspirates are unacceptable for Xpert Xpress SARS-CoV-2/FLU/RSV testing.  Fact Sheet for Patients: BloggerCourse.com  Fact Sheet for Healthcare Providers: SeriousBroker.it  This test is not yet approved or cleared by the [place] FDA and has been authorized for detection and/or diagnosis of SARS-CoV-2 by FDA under an Emergency Use Authorization (EUA). This EUA will remain in effect (meaning this test can be used) for the duration of the COVID-19 declaration under Section 564(b)(1) of the Act, 21 U.S.C. section 360bbb-3(b)(1), unless the authorization is terminated or revoked.  Performed at [facility], [address], [place], [place] [address]   MRSA Next Gen by PCR, Nasal     Status: None   Collection Time: 11/15/21 11:54 PM   Specimen: Nasal Mucosa; Nasal Swab  Result Value Ref Range Status   MRSA by PCR Next Gen NOT DETECTED NOT DETECTED Final     Comment: (NOTE) The GeneXpert MRSA Assay (FDA approved for NASAL specimens only), is one component of a comprehensive MRSA colonization surveillance program. It is not intended to diagnose MRSA infection nor to guide or monitor treatment for MRSA infections. Test performance is not FDA approved in patients less than 24 years old. Performed at [facility], [address], [place], [place] [address]      Time coordinating discharge:  SIGNED:  Alwyn Ren, MD  [facility] 11/16/2021, 3:07 PM

## 2021-11-16 NOTE — Progress Notes (Signed)
Discharge instructions reviewed, IV access removed. Patient wheeled downstairs for discharge

## 2021-11-20 ENCOUNTER — Other Ambulatory Visit: Payer: Self-pay

## 2021-11-20 ENCOUNTER — Other Ambulatory Visit: Payer: 59

## 2021-11-20 ENCOUNTER — Other Ambulatory Visit (HOSPITAL_COMMUNITY): Payer: Self-pay

## 2021-11-20 DIAGNOSIS — I10 Essential (primary) hypertension: Secondary | ICD-10-CM

## 2021-11-28 ENCOUNTER — Other Ambulatory Visit (HOSPITAL_COMMUNITY): Payer: Self-pay

## 2021-11-28 DIAGNOSIS — I5032 Chronic diastolic (congestive) heart failure: Secondary | ICD-10-CM | POA: Diagnosis not present

## 2021-11-28 DIAGNOSIS — I159 Secondary hypertension, unspecified: Secondary | ICD-10-CM | POA: Diagnosis not present

## 2021-11-28 DIAGNOSIS — I5041 Acute combined systolic (congestive) and diastolic (congestive) heart failure: Secondary | ICD-10-CM | POA: Diagnosis not present

## 2021-11-30 ENCOUNTER — Other Ambulatory Visit: Payer: Self-pay

## 2021-11-30 ENCOUNTER — Other Ambulatory Visit (HOSPITAL_COMMUNITY): Payer: Self-pay

## 2021-11-30 ENCOUNTER — Ambulatory Visit (INDEPENDENT_AMBULATORY_CARE_PROVIDER_SITE_OTHER): Payer: 59 | Admitting: Nurse Practitioner

## 2021-11-30 ENCOUNTER — Encounter: Payer: Self-pay | Admitting: Nurse Practitioner

## 2021-11-30 VITALS — BP 126/88 | HR 68 | Temp 96.8°F | Ht 66.25 in | Wt 344.4 lb

## 2021-11-30 DIAGNOSIS — I110 Hypertensive heart disease with heart failure: Secondary | ICD-10-CM | POA: Diagnosis not present

## 2021-11-30 DIAGNOSIS — F32A Depression, unspecified: Secondary | ICD-10-CM | POA: Diagnosis not present

## 2021-11-30 DIAGNOSIS — I43 Cardiomyopathy in diseases classified elsewhere: Secondary | ICD-10-CM

## 2021-11-30 DIAGNOSIS — R7303 Prediabetes: Secondary | ICD-10-CM | POA: Diagnosis not present

## 2021-11-30 DIAGNOSIS — F419 Anxiety disorder, unspecified: Secondary | ICD-10-CM | POA: Diagnosis not present

## 2021-11-30 DIAGNOSIS — E559 Vitamin D deficiency, unspecified: Secondary | ICD-10-CM

## 2021-11-30 LAB — CBC
HCT: 42.8 % (ref 36.0–46.0)
Hemoglobin: 13.6 g/dL (ref 12.0–15.0)
MCHC: 31.8 g/dL (ref 30.0–36.0)
MCV: 83.3 fl (ref 78.0–100.0)
Platelets: 194 10*3/uL (ref 150.0–400.0)
RBC: 5.14 Mil/uL — ABNORMAL HIGH (ref 3.87–5.11)
RDW: 13.6 % (ref 11.5–15.5)
WBC: 8.5 10*3/uL (ref 4.0–10.5)

## 2021-11-30 LAB — BASIC METABOLIC PANEL
BUN: 19 mg/dL (ref 6–23)
CO2: 32 mEq/L (ref 19–32)
Calcium: 9.5 mg/dL (ref 8.4–10.5)
Chloride: 101 mEq/L (ref 96–112)
Creatinine, Ser: 0.96 mg/dL (ref 0.40–1.20)
GFR: 72.67 mL/min (ref >60.00)
Glucose, Bld: 102 mg/dL — ABNORMAL HIGH (ref 70–99)
Potassium: 4.5 mEq/L (ref 3.5–5.1)
Sodium: 139 mEq/L (ref 135–145)

## 2021-11-30 LAB — VITAMIN D 25 HYDROXY (VIT D DEFICIENCY, FRACTURES): VITD: 17.85 ng/mL — ABNORMAL LOW (ref 30.00–100.00)

## 2021-11-30 LAB — HEMOGLOBIN A1C: Hgb A1c MFr Bld: 6.2 % (ref 4.6–6.5)

## 2021-11-30 MED ORDER — LOSARTAN POTASSIUM 100 MG PO TABS
100.0000 mg | ORAL_TABLET | Freq: Every day | ORAL | 3 refills | Status: DC
  Filled 2021-11-30: qty 90, 90d supply, fill #0
  Filled 2022-04-16: qty 90, 90d supply, fill #1
  Filled 2022-08-21: qty 90, 90d supply, fill #2

## 2021-11-30 NOTE — Patient Instructions (Addendum)
Schedule f/up appt with cardiology  Continue to monitor BP at home in AM and PM Take BP readings to cardiology appt.  Go to lab for blood draw  Maintain current medications

## 2021-11-30 NOTE — Assessment & Plan Note (Signed)
Persistent fatigue ?Repeat vit. D ?

## 2021-11-30 NOTE — Assessment & Plan Note (Signed)
wellbutrin discontinue by patient. She does not think it is needed at this time. Agreed to psychology referral.

## 2021-11-30 NOTE — Assessment & Plan Note (Signed)
Repeat hgbA1c 

## 2021-11-30 NOTE — Progress Notes (Signed)
Subjective:  Patient ID: Amber Bentley, adult    DOB: 12/08/1976  Age: 45 y.o. MRN: [id number]  CC: Hospitalization Follow-up [facility] f/u-Pt states she was in the hospital for elevated BP and has since got it back under control. )  HPI  Hypertensive cardiomyopathy, 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HCC) She admits to non-complaince with CPAP prior to recent hospitalization. This was due to sinus congestion. This has now resolved and she has resume use of CPAP machine daily. Today BP is at goal with coreg, hydralazine, losartan and hydralazine No LE edema, no PND, no SOB, no dizziness, no headache, no CP.  BP Readings from Last 3 Encounters:  11/30/21 126/88  11/16/21 (!) 178/85  10/18/21 (!) 152/88   Repeat cbc and BMP today Advised to schedule appt with cardiology, maintain DASH diet, and med doses  Vitamin D deficiency Persistent fatigue Repeat vit.D  Prediabetes Repeat hgbA1c  Anxiety and depression wellbutrin discontinue by patient. She does not think it is needed at this time. Agreed to psychology referral.  Depression screen [facility] 2/9 11/30/2021 09/16/2018 12/30/2017  Decreased Interest 0 0 3  Down, Depressed, Hopeless 1 0 3  PHQ - 2 Score 1 0 6  Altered sleeping 0 - 3  Tired, decreased energy 0 - 3  Change in appetite 1 - 3  Feeling bad or failure about yourself  1 - 3  Trouble concentrating 0 - 3  Moving slowly or fidgety/restless 0 - 3  Suicidal thoughts 0 - 3  PHQ-9 Score 3 - 27  Difficult doing work/chores Somewhat difficult - Somewhat difficult    GAD 7 : Generalized Anxiety Score 11/30/2021  Nervous, Anxious, on Edge 1  Control/stop worrying 1  Worry too much - different things 1  Trouble relaxing 1  Restless 1  Easily annoyed or irritable 1  Afraid - awful might happen 1  Total GAD 7 Score 7  Anxiety Difficulty Somewhat difficult   Reviewed past Medical, Social and Family history today.  Outpatient Medications Prior to Visit  Medication Sig Dispense  Refill   carvedilol (COREG) 25 MG tablet Take 1 tablet (25 mg total) by mouth 2 (two) times daily with a meal. 180 tablet 3   furosemide (LASIX) 20 MG tablet Take 1 tablet (20 mg total) by mouth daily. 30 tablet 2   hydrALAZINE (APRESOLINE) 25 MG tablet Take 2 tablets (50 mg total) by mouth 3 (three) times daily. 90 tablet 2   pantoprazole (PROTONIX) 40 MG tablet TAKE 1 TABLET (40 MG TOTAL) BY MOUTH DAILY. 90 tablet 0   rosuvastatin (CRESTOR) 10 MG tablet TAKE 1 TABLET BY MOUTH ONCE DAILY (Patient taking differently: Take 10 mg by mouth daily.) 90 tablet 3   spironolactone (ALDACTONE) 50 MG tablet Take 1 tablet (50 mg total) by mouth daily. 90 tablet 3   vitamin B-12 (CYANOCOBALAMIN) 1000 MCG tablet Take 1 tablet (1,000 mcg total) by mouth daily.     metFORMIN (GLUCOPHAGE) 500 MG tablet TAKE 1 TABLET BY MOUTH ONCE DAILY WITH BREAKFAST 30 tablet 2   losartan (COZAAR) 100 MG tablet Take 1 tablet (100 mg total) by mouth daily. (Patient not taking: Reported on 11/16/2021) 90 tablet 3   No facility-administered medications prior to visit.    ROS See HPI  Objective:  BP 126/88 (BP Location: Left Arm, Patient Position: Sitting, Cuff Size: Large)    Pulse 68    Temp (!) 96.8 F (36 C) (Temporal)    Ht 5' 6.25" (1.683 m)  Wt (!) 344 lb 6.4 oz (156.2 kg)    SpO2 96%    BMI 55.17 kg/m   Physical Exam Cardiovascular:     Rate and Rhythm: Normal rate and regular rhythm.     Pulses: Normal pulses.     Heart sounds: Normal heart sounds.  Pulmonary:     Effort: Pulmonary effort is normal.     Breath sounds: Normal breath sounds.  Musculoskeletal:     Right lower leg: No edema.     Left lower leg: No edema.  Neurological:     Mental Status: Amber Bentley is alert and oriented to person, place, and time.  Psychiatric:        Mood and Affect: Mood normal.        Behavior: Behavior normal.        Thought Content: Thought content normal.    Assessment & Plan:  This visit occurred during  the SARS-CoV-2 public health emergency.  Safety protocols were in place, including screening questions prior to the visit, additional usage of staff PPE, and extensive cleaning of exam room while observing appropriate contact time as indicated for disinfecting solutions.   [name] was seen today for hospitalization follow-up.  Diagnoses and all orders for this visit:  Hypertensive cardiomyopathy, 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HCC) -     Basic metabolic panel -     CBC -     losartan (COZAAR) 100 MG tablet; Take 1 tablet (100 mg total) by mouth daily.  Prediabetes -     Hemoglobin A1c  Vitamin D deficiency -     Vitamin D (25 hydroxy)  Anxiety and depression -     Ambulatory referral to Psychology   Problem List Items Addressed This Visit       Cardiovascular and Mediastinum   Hypertensive cardiomyopathy, 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HCC) - Primary    She admits to non-complaince with CPAP prior to recent hospitalization. This was due to sinus congestion. This has now resolved and she has resume use of CPAP machine daily. Today BP is at goal with coreg, hydralazine, losartan and hydralazine No LE edema, no PND, no SOB, no dizziness, no headache, no CP.  BP Readings from Last 3 Encounters:  11/30/21 126/88  11/16/21 (!) 178/85  10/18/21 (!) 152/88   Repeat cbc and BMP today Advised to schedule appt with cardiology, maintain DASH diet, and med doses      Relevant Medications   losartan (COZAAR) 100 MG tablet   Other Relevant Orders   Basic metabolic panel   CBC     Other   Anxiety and depression    wellbutrin discontinue by patient. She does not think it is needed at this time. Agreed to psychology referral.      Relevant Orders   Ambulatory referral to Psychology   Prediabetes    Repeat hgbA1c      Relevant Orders   Hemoglobin A1c   Vitamin D deficiency    Persistent fatigue Repeat vit.D      Relevant Orders   Vitamin D (25 hydroxy)     Follow-up: Return in about 6 months  (around 05/31/2022) for CPE (fasting).  Amber Penna, NP

## 2021-11-30 NOTE — Assessment & Plan Note (Addendum)
She admits to non-complaince with CPAP prior to recent hospitalization. This was due to sinus congestion. This has now resolved and she has resume use of CPAP machine daily. Today BP is at goal with coreg, hydralazine, losartan and hydralazine No LE edema, no PND, no SOB, no dizziness, no headache, no CP.  BP Readings from Last 3 Encounters:  11/30/21 126/88  11/16/21 (!) 178/85  10/18/21 (!) 152/88   Repeat cbc and BMP today Advised to schedule appt with cardiology, maintain DASH diet, and med doses

## 2021-11-30 NOTE — Assessment & Plan Note (Signed)
>>  ASSESSMENT AND PLAN FOR ANXIETY AND DEPRESSION WRITTEN ON 11/30/2021 12:50 PM BY Clemencia Helzer LUM, NP  wellbutrin discontinue by patient. She does not think it is needed at this time. Agreed to psychology referral.

## 2021-12-01 ENCOUNTER — Other Ambulatory Visit (HOSPITAL_COMMUNITY): Payer: Self-pay

## 2021-12-01 MED ORDER — VITAMIN D (ERGOCALCIFEROL) 1.25 MG (50000 UNIT) PO CAPS
50000.0000 [IU] | ORAL_CAPSULE | ORAL | 0 refills | Status: DC
  Filled 2021-12-01: qty 12, 84d supply, fill #0

## 2021-12-01 NOTE — Addendum Note (Signed)
Addended by: Alysia Penna L on: 12/01/2021 02:27 PM   Modules accepted: Orders

## 2021-12-05 ENCOUNTER — Telehealth: Payer: Self-pay | Admitting: *Deleted

## 2021-12-05 NOTE — Chronic Care Management (AMB) (Signed)
°  Care Management   Note  12/05/2021 Name: Amber Bentley MRN: [id number] DOB: Apr 17, 1976  Amber Bentley is a 45 y.o. year old adult who is a primary care patient of Nche, Bonna Gains, NP. I reached out to [name] by phone today in response to a referral sent by   Zorita Pang primary care provider.     [place] was given information about care management services today including:  Care management services include personalized support from designated clinical staff supervised by Zorita Pang physician, including individualized plan of care and coordination with other care providers 24/7 contact phone numbers for assistance for urgent and routine care needs. The patient may stop care management services at any time by phone call to the office staff.  Patient agreed to services and verbal consent obtained.   Follow up plan: Telephone appointment with care management team member scheduled for: 01/24/2022  Burman Nieves, CCMA Care Guide, Embedded Care Coordination [facility]   Care Management  Direct Dial: [phone]

## 2021-12-07 ENCOUNTER — Other Ambulatory Visit (HOSPITAL_COMMUNITY): Payer: Self-pay

## 2021-12-07 MED ORDER — OZEMPIC (0.25 OR 0.5 MG/DOSE) 2 MG/1.5ML ~~LOC~~ SOPN
0.5000 mg | PEN_INJECTOR | SUBCUTANEOUS | 0 refills | Status: DC
  Filled 2021-12-07: qty 1.5, 28d supply, fill #0

## 2021-12-11 ENCOUNTER — Inpatient Hospital Stay (HOSPITAL_COMMUNITY)
Admission: EM | Admit: 2021-12-11 | Discharge: 2021-12-13 | DRG: 865 | Disposition: A | Discharge status: Home / Self Care | Payer: 59 | Attending: Student | Admitting: Student

## 2021-12-11 ENCOUNTER — Emergency Department (HOSPITAL_COMMUNITY): Payer: 59

## 2021-12-11 ENCOUNTER — Encounter (HOSPITAL_COMMUNITY): Payer: Self-pay

## 2021-12-11 ENCOUNTER — Other Ambulatory Visit: Payer: Self-pay

## 2021-12-11 DIAGNOSIS — G4733 Obstructive sleep apnea (adult) (pediatric): Secondary | ICD-10-CM | POA: Diagnosis present

## 2021-12-11 DIAGNOSIS — J441 Chronic obstructive pulmonary disease with (acute) exacerbation: Secondary | ICD-10-CM

## 2021-12-11 DIAGNOSIS — R0602 Shortness of breath: Secondary | ICD-10-CM | POA: Diagnosis not present

## 2021-12-11 DIAGNOSIS — Z833 Family history of diabetes mellitus: Secondary | ICD-10-CM

## 2021-12-11 DIAGNOSIS — E785 Hyperlipidemia, unspecified: Secondary | ICD-10-CM | POA: Diagnosis present

## 2021-12-11 DIAGNOSIS — E278 Other specified disorders of adrenal gland: Secondary | ICD-10-CM | POA: Diagnosis present

## 2021-12-11 DIAGNOSIS — B348 Other viral infections of unspecified site: Secondary | ICD-10-CM | POA: Diagnosis not present

## 2021-12-11 DIAGNOSIS — Z818 Family history of other mental and behavioral disorders: Secondary | ICD-10-CM

## 2021-12-11 DIAGNOSIS — I5042 Chronic combined systolic (congestive) and diastolic (congestive) heart failure: Secondary | ICD-10-CM | POA: Diagnosis not present

## 2021-12-11 DIAGNOSIS — R7303 Prediabetes: Secondary | ICD-10-CM | POA: Diagnosis present

## 2021-12-11 DIAGNOSIS — Z720 Tobacco use: Secondary | ICD-10-CM | POA: Diagnosis not present

## 2021-12-11 DIAGNOSIS — I5032 Chronic diastolic (congestive) heart failure: Secondary | ICD-10-CM | POA: Diagnosis not present

## 2021-12-11 DIAGNOSIS — R062 Wheezing: Secondary | ICD-10-CM

## 2021-12-11 DIAGNOSIS — I110 Hypertensive heart disease with heart failure: Secondary | ICD-10-CM | POA: Diagnosis not present

## 2021-12-11 DIAGNOSIS — K219 Gastro-esophageal reflux disease without esophagitis: Secondary | ICD-10-CM | POA: Diagnosis present

## 2021-12-11 DIAGNOSIS — R079 Chest pain, unspecified: Secondary | ICD-10-CM | POA: Diagnosis not present

## 2021-12-11 DIAGNOSIS — F17210 Nicotine dependence, cigarettes, uncomplicated: Secondary | ICD-10-CM | POA: Diagnosis present

## 2021-12-11 DIAGNOSIS — I5030 Unspecified diastolic (congestive) heart failure: Secondary | ICD-10-CM | POA: Diagnosis present

## 2021-12-11 DIAGNOSIS — J45901 Unspecified asthma with (acute) exacerbation: Secondary | ICD-10-CM | POA: Diagnosis present

## 2021-12-11 DIAGNOSIS — J9601 Acute respiratory failure with hypoxia: Secondary | ICD-10-CM

## 2021-12-11 DIAGNOSIS — Z91018 Allergy to other foods: Secondary | ICD-10-CM

## 2021-12-11 DIAGNOSIS — F32A Depression, unspecified: Secondary | ICD-10-CM | POA: Diagnosis not present

## 2021-12-11 DIAGNOSIS — Z7984 Long term (current) use of oral hypoglycemic drugs: Secondary | ICD-10-CM

## 2021-12-11 DIAGNOSIS — Z79899 Other long term (current) drug therapy: Secondary | ICD-10-CM

## 2021-12-11 DIAGNOSIS — I159 Secondary hypertension, unspecified: Secondary | ICD-10-CM | POA: Diagnosis not present

## 2021-12-11 DIAGNOSIS — F419 Anxiety disorder, unspecified: Secondary | ICD-10-CM | POA: Diagnosis present

## 2021-12-11 DIAGNOSIS — Z6841 Body Mass Index (BMI) 40.0 and over, adult: Secondary | ICD-10-CM

## 2021-12-11 DIAGNOSIS — R918 Other nonspecific abnormal finding of lung field: Secondary | ICD-10-CM | POA: Diagnosis not present

## 2021-12-11 DIAGNOSIS — Z886 Allergy status to analgesic agent status: Secondary | ICD-10-CM

## 2021-12-11 DIAGNOSIS — E739 Lactose intolerance, unspecified: Secondary | ICD-10-CM | POA: Diagnosis present

## 2021-12-11 DIAGNOSIS — Z8249 Family history of ischemic heart disease and other diseases of the circulatory system: Secondary | ICD-10-CM

## 2021-12-11 DIAGNOSIS — Z8711 Personal history of peptic ulcer disease: Secondary | ICD-10-CM | POA: Diagnosis not present

## 2021-12-11 DIAGNOSIS — I5041 Acute combined systolic (congestive) and diastolic (congestive) heart failure: Secondary | ICD-10-CM | POA: Diagnosis not present

## 2021-12-11 DIAGNOSIS — M47814 Spondylosis without myelopathy or radiculopathy, thoracic region: Secondary | ICD-10-CM | POA: Diagnosis not present

## 2021-12-11 DIAGNOSIS — Z83438 Family history of other disorder of lipoprotein metabolism and other lipidemia: Secondary | ICD-10-CM | POA: Diagnosis not present

## 2021-12-11 DIAGNOSIS — Z20822 Contact with and (suspected) exposure to covid-19: Secondary | ICD-10-CM | POA: Diagnosis present

## 2021-12-11 DIAGNOSIS — I272 Pulmonary hypertension, unspecified: Secondary | ICD-10-CM | POA: Diagnosis not present

## 2021-12-11 DIAGNOSIS — R0902 Hypoxemia: Secondary | ICD-10-CM | POA: Diagnosis not present

## 2021-12-11 DIAGNOSIS — Z90710 Acquired absence of both cervix and uterus: Secondary | ICD-10-CM | POA: Diagnosis not present

## 2021-12-11 DIAGNOSIS — E279 Disorder of adrenal gland, unspecified: Secondary | ICD-10-CM | POA: Diagnosis present

## 2021-12-11 HISTORY — DX: Wheezing: R06.2

## 2021-12-11 HISTORY — DX: Acute respiratory failure with hypoxia: J96.01

## 2021-12-11 LAB — CBC WITH DIFFERENTIAL/PLATELET
Abs Immature Granulocytes: 0.06 10*3/uL (ref 0.00–0.07)
Basophils Absolute: 0 10*3/uL (ref 0.0–0.1)
Basophils Relative: 0 %
Eosinophils Absolute: 0.2 10*3/uL (ref 0.0–0.5)
Eosinophils Relative: 2 %
HCT: 49.2 % — ABNORMAL HIGH (ref 36.0–46.0)
Hemoglobin: 14.7 g/dL (ref 12.0–15.0)
Immature Granulocytes: 1 %
Lymphocytes Relative: 8 %
Lymphs Abs: 0.9 10*3/uL (ref 0.7–4.0)
MCH: 26 pg (ref 26.0–34.0)
MCHC: 29.9 g/dL — ABNORMAL LOW (ref 30.0–36.0)
MCV: 87.1 fL (ref 80.0–100.0)
Monocytes Absolute: 0.8 10*3/uL (ref 0.1–1.0)
Monocytes Relative: 7 %
Neutro Abs: 9.7 10*3/uL — ABNORMAL HIGH (ref 1.7–7.7)
Neutrophils Relative %: 82 %
Platelets: 190 10*3/uL (ref 150–400)
RBC: 5.65 MIL/uL — ABNORMAL HIGH (ref 3.87–5.11)
RDW: 13 % (ref 11.5–15.5)
WBC: 11.6 10*3/uL — ABNORMAL HIGH (ref 4.0–10.5)
nRBC: 0 % (ref 0.0–0.2)

## 2021-12-11 LAB — COMPREHENSIVE METABOLIC PANEL
ALT: 32 U/L (ref 0–44)
AST: 24 U/L (ref 15–41)
Albumin: 4.2 g/dL (ref 3.5–5.0)
Alkaline Phosphatase: 68 U/L (ref 38–126)
Anion gap: 8 (ref 5–15)
BUN: 17 mg/dL (ref 6–20)
CO2: 28 mmol/L (ref 22–32)
Calcium: 9.2 mg/dL (ref 8.9–10.3)
Chloride: 101 mmol/L (ref 98–111)
Creatinine, Ser: 1.01 mg/dL — ABNORMAL HIGH (ref 0.44–1.00)
GFR, Estimated: >60 mL/min (ref >60)
Glucose, Bld: 129 mg/dL — ABNORMAL HIGH (ref 70–99)
Potassium: 4.1 mmol/L (ref 3.5–5.1)
Sodium: 137 mmol/L (ref 135–145)
Total Bilirubin: 0.5 mg/dL (ref 0.3–1.2)
Total Protein: 7.9 g/dL (ref 6.5–8.1)

## 2021-12-11 LAB — RESP PANEL BY RT-PCR (FLU A&B, COVID) ARPGX2
Influenza A by PCR: NEGATIVE
Influenza B by PCR: NEGATIVE
SARS Coronavirus 2 by RT PCR: NEGATIVE

## 2021-12-11 LAB — TROPONIN I (HIGH SENSITIVITY)
Troponin I (High Sensitivity): 12 ng/L (ref <18)
Troponin I (High Sensitivity): 10 ng/L (ref <18)

## 2021-12-11 LAB — BRAIN NATRIURETIC PEPTIDE: B Natriuretic Peptide: 40.4 pg/mL (ref 0.0–100.0)

## 2021-12-11 MED ORDER — IPRATROPIUM-ALBUTEROL 0.5-2.5 (3) MG/3ML IN SOLN
3.0000 mL | Freq: Four times a day (QID) | RESPIRATORY_TRACT | Status: DC
  Administered 2021-12-12 – 2021-12-13 (×6): 3 mL via RESPIRATORY_TRACT
  Filled 2021-12-11 (×6): qty 3

## 2021-12-11 MED ORDER — ACETAMINOPHEN 325 MG PO TABS: 650.0000 mg | ORAL_TABLET | Freq: Four times a day (QID) | ORAL | Status: DC | PRN

## 2021-12-11 MED ORDER — IPRATROPIUM-ALBUTEROL 0.5-2.5 (3) MG/3ML IN SOLN
6.0000 mL | Freq: Once | RESPIRATORY_TRACT | Status: AC
  Administered 2021-12-11: 22:00:00 6 mL via RESPIRATORY_TRACT
  Filled 2021-12-11: qty 6

## 2021-12-11 MED ORDER — PREDNISONE 20 MG PO TABS
40.0000 mg | ORAL_TABLET | Freq: Every day | ORAL | Status: DC
  Administered 2021-12-12: 09:00:00 40 mg via ORAL
  Filled 2021-12-11: qty 2

## 2021-12-11 MED ORDER — MELATONIN 5 MG PO TABS: 10.0000 mg | ORAL_TABLET | Freq: Every evening | ORAL | Status: DC | PRN

## 2021-12-11 MED ORDER — METHYLPREDNISOLONE SODIUM SUCC 125 MG IJ SOLR
125.0000 mg | Freq: Every day | INTRAMUSCULAR | Status: DC
  Administered 2021-12-11: 20:00:00 125 mg via INTRAVENOUS
  Filled 2021-12-11: qty 2

## 2021-12-11 MED ORDER — SPIRONOLACTONE 25 MG PO TABS
50.0000 mg | ORAL_TABLET | Freq: Every day | ORAL | Status: DC
  Administered 2021-12-12 – 2021-12-13 (×2): 50 mg via ORAL
  Filled 2021-12-11 (×2): qty 2

## 2021-12-11 MED ORDER — ACETAMINOPHEN 325 MG PO TABS
650.0000 mg | ORAL_TABLET | Freq: Once | ORAL | Status: AC | PRN
  Administered 2021-12-11: 18:00:00 650 mg via ORAL
  Filled 2021-12-11: qty 2

## 2021-12-11 MED ORDER — NICOTINE POLACRILEX 2 MG MT GUM
2.0000 mg | CHEWING_GUM | OROMUCOSAL | Status: DC | PRN
  Filled 2021-12-11: qty 1

## 2021-12-11 MED ORDER — HYDRALAZINE HCL 50 MG PO TABS
50.0000 mg | ORAL_TABLET | Freq: Three times a day (TID) | ORAL | Status: DC
  Administered 2021-12-11 – 2021-12-13 (×5): 50 mg via ORAL
  Filled 2021-12-11 (×2): qty 1
  Filled 2021-12-11: qty 2
  Filled 2021-12-11 (×2): qty 1

## 2021-12-11 MED ORDER — CARVEDILOL 25 MG PO TABS
25.0000 mg | ORAL_TABLET | Freq: Two times a day (BID) | ORAL | Status: DC
  Administered 2021-12-12 – 2021-12-13 (×3): 25 mg via ORAL
  Filled 2021-12-11 (×3): qty 1

## 2021-12-11 MED ORDER — LOSARTAN POTASSIUM 50 MG PO TABS
100.0000 mg | ORAL_TABLET | Freq: Every day | ORAL | Status: DC
  Administered 2021-12-12 – 2021-12-13 (×2): 100 mg via ORAL
  Filled 2021-12-11 (×2): qty 2

## 2021-12-11 MED ORDER — IPRATROPIUM-ALBUTEROL 0.5-2.5 (3) MG/3ML IN SOLN
3.0000 mL | Freq: Once | RESPIRATORY_TRACT | Status: AC
  Administered 2021-12-11: 20:00:00 3 mL via RESPIRATORY_TRACT
  Filled 2021-12-11: qty 3

## 2021-12-11 MED ORDER — ENOXAPARIN SODIUM 40 MG/0.4ML IJ SOSY: 40.0000 mg | PREFILLED_SYRINGE | INTRAMUSCULAR | Status: DC

## 2021-12-11 MED ORDER — IOHEXOL 350 MG/ML SOLN
100.0000 mL | Freq: Once | INTRAVENOUS | Status: AC | PRN
  Administered 2021-12-11: 23:00:00 100 mL via INTRAVENOUS

## 2021-12-11 MED ORDER — ALBUTEROL SULFATE HFA 108 (90 BASE) MCG/ACT IN AERS
2.0000 | INHALATION_SPRAY | RESPIRATORY_TRACT | Status: DC | PRN
  Administered 2021-12-11 – 2021-12-12 (×2): 2 via RESPIRATORY_TRACT
  Filled 2021-12-11: qty 6.7

## 2021-12-11 MED ORDER — ONDANSETRON HCL 4 MG/2ML IJ SOLN: 4.0000 mg | Freq: Four times a day (QID) | INTRAMUSCULAR | Status: DC | PRN

## 2021-12-11 NOTE — Assessment & Plan Note (Signed)
Admit to observation telemetry bed. Continue with supplemental O2. Wean to RA as tolerated. Pt does not use home O2

## 2021-12-11 NOTE — Subjective & Objective (Signed)
CC: SOB, hypoxia HPI: 45 year old female with a history of chronic diastolic heart failure, OSA, pulmonary hypertension, morbid obesity, tobacco abuse presents to the ER today with a 1 day history of worsening shortness of breath.  Patient states that she has had a sinus infection for few days.  has felt somewhat tired.  No fever no chills.  Started feeling short of breath this morning.  She took a nap.  She felt to she was drowning.  She states that she did not wear his CPAP last night because of her sinus infection and the pressure inside her face when she puts the CPAP mask on.  She states that she woke up from a map this afternoon and was feeling like she was drowning.  She checked her pulse oximetry at home with a home pulse oximeter.  It read 79%.  She came to the ER for evaluation.  Patient placed on oxygen.  She did have a low-grade temp of 101.4.  CTPA was negative for acute PE.  There is no pneumonia.  There is no emphysematous changes.  There is a indeterminate 18 mm right adrenal nodule.  Due to the patient's hypoxic respite failure and wheezing, [facility] hospitalist contacted for admission.  Patient given steroids and a breathing treatment.

## 2021-12-11 NOTE — ED Triage Notes (Signed)
Patient reports a history of CHF. Patient c/o SOB and states her home O2 Sat has been  79%. Patient placed on O2 2L/min via [place] and sats increased to 92%. O2 increased to 3L/min via  and sats increased to 94%.  Patient states she began having SOB today, but had a non productive cough since yesterday.

## 2021-12-11 NOTE — ED Provider Notes (Signed)
[facility]-EMERGENCY DEPT Provider Note   CSN: [id number] Arrival date & time: 12/11/21  1721     History Chief Complaint  Patient presents with   Shortness of Breath    Amber Bentley is a 45 y.o. adult with PMH cardiomegaly, CHF who presents emergency department for evaluation of cough, shortness of breath and fever.  Patient states that her symptoms have been present for approximately 24 hours.  She also endorses a mild aching chest pain that is nonradiating, nonexertional.  Denies nausea, vomiting, abdominal pain, diarrhea or other systemic symptoms.  Patient found to be 79% on room air in triage.   Shortness of Breath Associated symptoms: chest pain and fever   Associated symptoms: no abdominal pain, no cough, no ear pain, no rash, no sore throat and no vomiting       Past Medical History:  Diagnosis Date   Anemia    Anxiety    Cardiomegaly    Chest pain    CHF (congestive heart failure) (HCC)    Constipation    Depression    Dyspnea    Fatigue    Food allergy    celery   GERD (gastroesophageal reflux disease)    Hip pain    HLD (hyperlipidemia)    HTN (hypertension)    Infertility, female    Lactose intolerance    Leg edema    Lower back pain    OSA on CPAP    Palpitations    Prediabetes    Shoulder pain    Stomach ulcer    Varicose vein of leg     Patient Active Problem List   Diagnosis Date Noted   Hypertensive urgency 11/15/2021   Vitamin D deficiency 01/14/2018   Prediabetes 01/14/2018   OSA (obstructive sleep apnea) 07/20/2017   Nasal sore 07/18/2017   Chronic diastolic CHF (congestive heart failure) (HCC) 03/01/2017   Pulmonary hypertension (HCC) 03/01/2017   Morbid obesity (HCC)    SOB (shortness of breath)    Migraine without status migrainosus, not intractable    Altered mental state    Hypertensive cardiomyopathy, 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HCC)    Congestive heart failure (HCC)    Pulmonary edema    Adjustment  disorder with mixed anxiety and depressed mood 02/21/2017   Acute combined systolic and diastolic congestive heart failure (HCC) 02/21/2017   Tobacco abuse 02/21/2017   Chest pain 02/21/2017   Anxiety and depression 02/21/2017    Past Surgical History:  Procedure Laterality Date   ABDOMINAL HYSTERECTOMY     ENDOMETRIAL ABLATION  2007   prior to hysterectomy     OB History   No obstetric history on file.     Family History  Problem Relation Age of Onset   Hypertension Mother    Cancer Mother        breast cancer   Kidney disease Mother    Depression Mother    Obesity Mother    Breast cancer Mother    Diabetes Father    Heart disease Father 24   Hyperlipidemia Father    Hypertension Father    Depression Father    Anxiety disorder Father    Sleep apnea Father    Obesity Father    Thyroid disease Sister    Sudden death Brother    Cancer Maternal Aunt        breast cancer   Breast cancer Maternal Aunt        in 60's   Cancer Paternal  Grandfather        brain cancer   Cancer Maternal Grandmother        breast   Breast cancer Maternal Grandmother    Breast cancer Paternal Grandmother     Social History   Tobacco Use   Smoking status: Every Day    Packs/day: 0.25    Years: 10.00    Pack years: 2.50    Types: Cigarettes   Smokeless tobacco: Never  Vaping Use   Vaping Use: Never used  Substance Use Topics   Alcohol use: Yes    Comment: rarely   Drug use: No    Home Medications Prior to Admission medications   Medication Sig Start Date End Date Taking? Authorizing Provider  carvedilol (COREG) 25 MG tablet Take 1 tablet (25 mg total) by mouth 2 (two) times daily with a meal. 11/16/21   Georgette Shell, MD  furosemide (LASIX) 20 MG tablet Take 1 tablet (20 mg total) by mouth daily. 11/16/21   Georgette Shell, MD  hydrALAZINE (APRESOLINE) 25 MG tablet Take 2 tablets (50 mg total) by mouth 3 (three) times daily. 11/16/21   Georgette Shell, MD   losartan (COZAAR) 100 MG tablet Take 1 tablet (100 mg total) by mouth daily. 11/30/21   Nche, Charlene Brooke, NP  metFORMIN (GLUCOPHAGE) 500 MG tablet TAKE 1 TABLET BY MOUTH ONCE DAILY WITH BREAKFAST 06/28/20 06/28/21  Laqueta Linden, MD  pantoprazole (PROTONIX) 40 MG tablet TAKE 1 TABLET (40 MG TOTAL) BY MOUTH DAILY. 08/22/21   Nahser, Wonda Cheng, MD  rosuvastatin (CRESTOR) 10 MG tablet TAKE 1 TABLET BY MOUTH ONCE DAILY Patient taking differently: Take 10 mg by mouth daily. 06/28/20   Nahser, Wonda Cheng, MD  Semaglutide,0.25 or 0.5MG /DOS, (OZEMPIC, 0.25 OR 0.5 MG/DOSE,) 2 MG/1.5ML SOPN Inject 0.5 mg into the skin once a week. 12/07/21   Nche, Charlene Brooke, NP  spironolactone (ALDACTONE) 50 MG tablet Take 1 tablet (50 mg total) by mouth daily. 10/18/21   Nahser, Wonda Cheng, MD  vitamin B-12 (CYANOCOBALAMIN) 1000 MCG tablet Take 1 tablet (1,000 mcg total) by mouth daily. 11/16/21   Georgette Shell, MD  Vitamin D, Ergocalciferol, (DRISDOL) 1.25 MG (50000 UNIT) CAPS capsule Take 1 capsule by mouth every 7 days. 12/01/21   Nche, Charlene Brooke, NP    Allergies    Food and Aleve [naproxen]  Review of Systems   Review of Systems  Constitutional:  Positive for fever. Negative for chills.  HENT:  Negative for ear pain and sore throat.   Eyes:  Negative for pain and visual disturbance.  Respiratory:  Positive for shortness of breath. Negative for cough.   Cardiovascular:  Positive for chest pain. Negative for palpitations.  Gastrointestinal:  Negative for abdominal pain and vomiting.  Genitourinary:  Negative for dysuria and hematuria.  Musculoskeletal:  Negative for arthralgias and back pain.  Skin:  Negative for color change and rash.  Neurological:  Negative for seizures and syncope.  All other systems reviewed and are negative.  Physical Exam Updated Vital Signs BP (!) 202/82 (BP Location: Right Arm)    Pulse 83    Temp (!) 101.4 F (38.6 C) (Oral)    Resp (!) 24    Ht 5' 6.5" (1.689 m)     Wt (!) 156 kg    SpO2 96%    BMI 54.69 kg/m   Physical Exam Vitals and nursing note reviewed.  Constitutional:      General: Amber Bentley is  not in acute distress.    Appearance: Amber Bentley is well-developed.  HENT:     Head: Normocephalic and atraumatic.  Eyes:     Conjunctiva/sclera: Conjunctivae normal.  Cardiovascular:     Rate and Rhythm: Normal rate and regular rhythm.     Heart sounds: No murmur heard. Pulmonary:     Effort: Pulmonary effort is normal. No respiratory distress.     Breath sounds: Wheezing present.  Abdominal:     Palpations: Abdomen is soft.     Tenderness: There is no abdominal tenderness.  Musculoskeletal:        General: No swelling.     Cervical back: Neck supple.  Skin:    General: Skin is warm and dry.     Capillary Refill: Capillary refill takes less than 2 seconds.  Neurological:     Mental Status: Amber Bentley is alert.  Psychiatric:        Mood and Affect: Mood normal.    ED Results / Procedures / Treatments   Labs (all labs ordered are listed, but only abnormal results are displayed) Labs Reviewed  CBC WITH DIFFERENTIAL/PLATELET - Abnormal; Notable for the following components:      Result Value   WBC 11.6 (*)    RBC 5.65 (*)    HCT 49.2 (*)    MCHC 29.9 (*)    Neutro Abs 9.7 (*)    All other components within normal limits  RESP PANEL BY RT-PCR (FLU A&B, COVID) ARPGX2  COMPREHENSIVE METABOLIC PANEL  BRAIN NATRIURETIC PEPTIDE  TROPONIN I (HIGH SENSITIVITY)    EKG None  Radiology DG Chest 2 View  Result Date: 12/11/2021 CLINICAL DATA:  Short of breath, hypoxia EXAM: CHEST - 2 VIEW COMPARISON:  04/27/2017 FINDINGS: The heart size and mediastinal contours are within normal limits. Both lungs are clear. The visualized skeletal structures are unremarkable. IMPRESSION: No active cardiopulmonary disease. Electronically Signed   By: Sharlet Salina M.D.   On: 12/11/2021 18:04    Procedures .Critical  Care Performed by: Glendora Score, MD Authorized by: Glendora Score, MD   Critical care provider statement:    Critical care time (minutes):  30   Critical care was necessary to treat or prevent imminent or life-threatening deterioration of the following conditions:  Respiratory failure   Critical care was time spent personally by me on the following activities:  Development of treatment plan with patient or surrogate, discussions with consultants, evaluation of patient's response to treatment, examination of patient, ordering and review of laboratory studies, ordering and review of radiographic studies, ordering and performing treatments and interventions, pulse oximetry, re-evaluation of patient's condition and review of old charts   Medications Ordered in ED Medications  albuterol (VENTOLIN HFA) 108 (90 Base) MCG/ACT inhaler 2 puff (2 puffs Inhalation Given 12/11/21 1744)  ipratropium-albuterol (DUONEB) 0.5-2.5 (3) MG/3ML nebulizer solution 3 mL (has no administration in time range)  acetaminophen (TYLENOL) tablet 650 mg (650 mg Oral Given 12/11/21 1744)    ED Course  I have reviewed the triage vital signs and the nursing notes.  Pertinent labs & imaging results that were available during my care of the patient were reviewed by me and considered in my medical decision making (see chart for details).    MDM Rules/Calculators/A&P                          Patient seen emergency department for evaluation of shortness of breath.  Physical exam  reveals bilateral expiratory wheezing, but cardiopulmonary exam otherwise unremarkable.  Chest x-ray unremarkable.  Laboratory evaluation with a leukocytosis to 11.6 but is otherwise unremarkable.  Patient given 3 DuoNeb's with mild improvement of her wheezing, as well as 125 methylprednisolone, and on reevaluation, patient persistently 85% on room air.  Patient does not have increased work of breathing but does have persistent hypoxia and thus we will  order a CT PE to rule out insidious causes of her hypoxia.  She will require admission for suspected first-time COPD exacerbation and every 4 albuterol's.   Final Clinical Impression(s) / ED Diagnoses Final diagnoses:  None    Rx / DC Orders ED Discharge Orders     None        Alessandro Griep, Debe Coder, MD 12/11/21 2238

## 2021-12-11 NOTE — Assessment & Plan Note (Signed)
Stable. Continue with GDMT. 

## 2021-12-11 NOTE — Assessment & Plan Note (Signed)
Chronic. 

## 2021-12-11 NOTE — Assessment & Plan Note (Signed)
Advised her to stop smoking.

## 2021-12-11 NOTE — H&P (Signed)
History and Physical    Amber Bentley [id number] DOB: April 09, 1976 DOA: 12/11/2021  PCP: Flossie Buffy, NP   Patient coming from: Home  I have personally briefly reviewed patient's old medical records in [place]  CC: SOB, hypoxia HPI: 45 year old female with a history of chronic diastolic heart failure, OSA, pulmonary hypertension, morbid obesity, tobacco abuse presents to the ER today with a 1 day history of worsening shortness of breath.  Patient states that she has had a sinus infection for few days.  has felt somewhat tired.  No fever no chills.  Started feeling short of breath this morning.  She took a nap.  She felt to she was drowning.  She states that she did not wear his CPAP last night because of her sinus infection and the pressure inside her face when she puts the CPAP mask on.  She states that she woke up from a map this afternoon and was feeling like she was drowning.  She checked her pulse oximetry at home with a home pulse oximeter.  It read 79%.  She came to the ER for evaluation.  Patient placed on oxygen.  She did have a low-grade temp of 101.4.  CTPA was negative for acute PE.  There is no pneumonia.  There is no emphysematous changes.  There is a indeterminate 18 mm right adrenal nodule.  Due to the patient's hypoxic respite failure and wheezing, [facility] contacted for admission.  Patient given steroids and a breathing treatment.     ED Course: hypoxic.  Started on supplemental oxygen.  CTPA negative for PE or emphysema.  Given steroids and nebulizer treatments.  Review of Systems:  Review of Systems  Constitutional:  Positive for fever and malaise/fatigue.  HENT:  Positive for congestion and sinus pain.   Eyes: Negative.   Respiratory:  Positive for cough, shortness of breath and wheezing.   Cardiovascular: Negative.   Gastrointestinal: Negative.   Genitourinary: Negative.   Musculoskeletal: Negative.   Skin: Negative.    Neurological: Negative.   Endo/Heme/Allergies: Negative.   Psychiatric/Behavioral: Negative.    All other systems reviewed and are negative.  Past Medical History:  Diagnosis Date   Anemia    Anxiety    Cardiomegaly    Chest pain    CHF (congestive heart failure) (HCC)    Constipation    Depression    Dyspnea    Fatigue    Food allergy    celery   GERD (gastroesophageal reflux disease)    Hip pain    HLD (hyperlipidemia)    HTN (hypertension)    Infertility, female    Lactose intolerance    Leg edema    Lower back pain    OSA on CPAP    Palpitations    Prediabetes    Shoulder pain    Stomach ulcer    Varicose vein of leg     Past Surgical History:  Procedure Laterality Date   ABDOMINAL HYSTERECTOMY     ENDOMETRIAL ABLATION  2007   prior to hysterectomy     reports that Harlei A. Tory has been smoking cigarettes. Anisha A. Jacobsen has a 2.50 pack-year smoking history. Jessaca A. Belford has never used smokeless tobacco. Loura A. Brookover reports current alcohol use. Shaynna A. Brodhead reports that Tomeika A. Naron does not use drugs.  Allergies  Allergen Reactions   Food Anaphylaxis and Other (See Comments)    Pt is allergic to celery.    Aleve [Naproxen]  Other (See Comments)    Pt states that this medication makes her loopy.     Family History  Problem Relation Age of Onset   Hypertension Mother    Cancer Mother        breast cancer   Kidney disease Mother    Depression Mother    Obesity Mother    Breast cancer Mother    Diabetes Father    Heart disease Father 59   Hyperlipidemia Father    Hypertension Father    Depression Father    Anxiety disorder Father    Sleep apnea Father    Obesity Father    Thyroid disease Sister    Sudden death Brother    Cancer Maternal Aunt        breast cancer   Breast cancer Maternal Aunt        in 41's   Cancer Paternal Grandfather        brain cancer   Cancer Maternal Grandmother        breast   Breast cancer Maternal  Grandmother    Breast cancer Paternal Grandmother     Prior to Admission medications   Medication Sig Start Date End Date Taking? Authorizing Provider  carvedilol (COREG) 25 MG tablet Take 1 tablet (25 mg total) by mouth 2 (two) times daily with a meal. 11/16/21  Yes Alwyn Ren, MD  furosemide (LASIX) 20 MG tablet Take 1 tablet (20 mg total) by mouth daily. Patient taking differently: Take 20 mg by mouth daily as needed for fluid or edema. 11/16/21  Yes Alwyn Ren, MD  hydrALAZINE (APRESOLINE) 25 MG tablet Take 2 tablets (50 mg total) by mouth 3 (three) times daily. 11/16/21  Yes Alwyn Ren, MD  losartan (COZAAR) 100 MG tablet Take 1 tablet (100 mg total) by mouth daily. 11/30/21  Yes Nche, Bonna Gains, NP  pantoprazole (PROTONIX) 40 MG tablet TAKE 1 TABLET (40 MG TOTAL) BY MOUTH DAILY. 08/22/21  Yes Nahser, Deloris Ping, MD  Pseudoephedrine-APAP-DM (DAYQUIL PO) Take 2 capsules by mouth daily as needed (cold symptoms).   Yes [provider]  spironolactone (ALDACTONE) 50 MG tablet Take 1 tablet (50 mg total) by mouth daily. 10/18/21  Yes Nahser, Deloris Ping, MD  metFORMIN (GLUCOPHAGE) 500 MG tablet TAKE 1 TABLET BY MOUTH ONCE DAILY WITH BREAKFAST 06/28/20 06/28/21  Langston Reusing, MD  rosuvastatin (CRESTOR) 10 MG tablet TAKE 1 TABLET BY MOUTH ONCE DAILY Patient not taking: Reported on 12/11/2021 06/28/20   Nahser, Deloris Ping, MD  Semaglutide,0.25 or 0.5MG /DOS, (OZEMPIC, 0.25 OR 0.5 MG/DOSE,) 2 MG/1.5ML SOPN Inject 0.5 mg into the skin once a week. 12/07/21   Nche, Bonna Gains, NP  vitamin B-12 (CYANOCOBALAMIN) 1000 MCG tablet Take 1 tablet (1,000 mcg total) by mouth daily. 11/16/21   Alwyn Ren, MD  Vitamin D, Ergocalciferol, (DRISDOL) 1.25 MG (50000 UNIT) CAPS capsule Take 1 capsule by mouth every 7 days. 12/01/21   Anne Ng, NP    Physical Exam: Vitals:   12/11/21 2030 12/11/21 2045 12/11/21 2145 12/11/21 2230  BP: (!) 174/74 (!) 184/74  (!) 168/68 (!) 149/86  Pulse: 83 77 78 78  Resp: (!) 32 (!) 21 (!) 32 (!) 22  Temp:      TempSrc:      SpO2: 95% 96% 91% 90%  Weight:      Height:        Physical Exam Vitals and nursing note reviewed.  Constitutional:  General: Amber Bentley is not in acute distress.    Appearance: Amber Bentley is obese. Amber Bentley is not ill-appearing, toxic-appearing or diaphoretic.  HENT:     Head: Normocephalic and atraumatic.     Nose: Nose normal. No rhinorrhea.  Eyes:     General:        Right eye: No discharge.        Left eye: No discharge.  Cardiovascular:     Rate and Rhythm: Normal rate and regular rhythm.     Pulses: Normal pulses.  Pulmonary:     Breath sounds: Examination of the right-middle field reveals wheezing. Examination of the left-middle field reveals wheezing. Examination of the right-lower field reveals wheezing. Examination of the left-lower field reveals wheezing. Wheezing present.  Abdominal:     General: Bowel sounds are normal. There is no distension.     Tenderness: There is no abdominal tenderness. There is no guarding or rebound.  Musculoskeletal:     Right lower leg: No edema.     Left lower leg: No edema.  Skin:    General: Skin is warm and dry.     Capillary Refill: Capillary refill takes less than 2 seconds.  Neurological:     General: No focal deficit present.     Mental Status: Amber Bentley is alert and oriented to person, place, and time.     Labs on Admission: I have personally reviewed following labs and imaging studies  CBC: Recent Labs  Lab 12/11/21 1848  WBC 11.6*  NEUTROABS 9.7*  HGB 14.7  HCT 49.2*  MCV 87.1  PLT 99991111   Basic Metabolic Panel: Recent Labs  Lab 12/11/21 1848  NA 137  K 4.1  CL 101  CO2 28  GLUCOSE 129*  BUN 17  CREATININE 1.01*  CALCIUM 9.2   GFR: Estimated Creatinine Clearance (by C-G formula based on SCr of 1.01 mg/dL (H)) Female: 111.9 mL/min (A) Female: 135.3 mL/min  (A) Liver Function Tests: Recent Labs  Lab 12/11/21 1848  AST 24  ALT 32  ALKPHOS 68  BILITOT 0.5  PROT 7.9  ALBUMIN 4.2   No results for input(s): LIPASE, AMYLASE in the last 168 hours. No results for input(s): AMMONIA in the last 168 hours. Coagulation Profile: No results for input(s): INR, PROTIME in the last 168 hours. Cardiac Enzymes: No results for input(s): CKTOTAL, CKMB, CKMBINDEX, TROPONINI in the last 168 hours. BNP (last 3 results) No results for input(s): PROBNP in the last 8760 hours. HbA1C: No results for input(s): HGBA1C in the last 72 hours. CBG: No results for input(s): GLUCAP in the last 168 hours. Lipid Profile: No results for input(s): CHOL, HDL, LDLCALC, TRIG, CHOLHDL, LDLDIRECT in the last 72 hours. Thyroid Function Tests: No results for input(s): TSH, T4TOTAL, FREET4, T3FREE, THYROIDAB in the last 72 hours. Anemia Panel: No results for input(s): VITAMINB12, FOLATE, FERRITIN, TIBC, IRON, RETICCTPCT in the last 72 hours. Urine analysis:    Component Value Date/Time   COLORURINE YELLOW 11/15/2021 1938   APPEARANCEUR CLEAR 11/15/2021 1938   LABSPEC 1.013 11/15/2021 1938   PHURINE 6.5 11/15/2021 1938   GLUCOSEU NEGATIVE 11/15/2021 1938   HGBUR NEGATIVE 11/15/2021 1938   BILIRUBINUR NEGATIVE 11/15/2021 1938   KETONESUR NEGATIVE 11/15/2021 1938   PROTEINUR 30 (A) 11/15/2021 1938   NITRITE NEGATIVE 11/15/2021 1938   LEUKOCYTESUR NEGATIVE 11/15/2021 1938    Radiological Exams on Admission: I have personally reviewed images DG Chest 2 View  Result Date: 12/11/2021  CLINICAL DATA:  Short of breath, hypoxia EXAM: CHEST - 2 VIEW COMPARISON:  04/27/2017 FINDINGS: The heart size and mediastinal contours are within normal limits. Both lungs are clear. The visualized skeletal structures are unremarkable. IMPRESSION: No active cardiopulmonary disease. Electronically Signed   By: Randa Ngo M.D.   On: 12/11/2021 18:04   CT Angio Chest PE W and/or Wo  Contrast  Result Date: 12/11/2021 CLINICAL DATA:  Pulmonary embolus suspected, high probability, chest pain and shortness of breath EXAM: CT ANGIOGRAPHY CHEST WITH CONTRAST TECHNIQUE: Multidetector CT imaging of the chest was performed using the standard protocol during bolus administration of intravenous contrast. Multiplanar CT image reconstructions and MIPs were obtained to evaluate the vascular anatomy. CONTRAST:  175mL OMNIPAQUE IOHEXOL 350 MG/ML SOLN COMPARISON:  None. FINDINGS: Cardiovascular: Satisfactory opacification of the pulmonary arteries to the proximal segmental level. No evidence of pulmonary embolism. No thoracic aortic aneurysm or dissection. Normal heart size. No significant pericardial effusion/thickening. Mediastinum/Nodes: No discrete thyroid nodule. No pathologically enlarged mediastinal, hilar or axillary lymph nodes. Lungs/Pleura: No focal airspace consolidation. No pleural effusion. No pneumothorax. Upper Abdomen: Indeterminate 18 mm right adrenal nodule. No acute abnormality. Musculoskeletal: Thoracic spondylosis. No acute osseous abnormality. Review of the MIP images confirms the above findings. IMPRESSION: 1. No evidence of pulmonary embolism or other acute intrathoracic abnormality. 2. Indeterminate 18 mm right adrenal nodule, incompletely evaluated on this examination but statistically likely to reflect a benign adrenal adenoma. Consider further evaluation with nonemergent adrenal protocol CT with and without contrast. Electronically Signed   By: Dahlia Bailiff M.D.   On: 12/11/2021 22:55    EKG: I have personally reviewed EKG: no EKG performed  Assessment/Plan Principal Problem:   Acute respiratory failure with hypoxia (HCC) Active Problems:   Wheezing   Tobacco abuse   Morbid obesity (HCC)   Chronic diastolic CHF (congestive heart failure) (HCC)   OSA (obstructive sleep apnea)    Acute respiratory failure with hypoxia (HCC) Admit to observation telemetry bed.  Continue with supplemental O2. Wean to RA as tolerated. Pt does not use home O2  Wheezing No prior hx of asthma or COPD. Pt does smoke. Treat as if she has asthma exacerbation/copd exacerbation. No productive cough. No indication for abx. Continue with po prednisone. Will need home nebulizer machine at discharge. Will need formal PFTs in the office in 4 week after last dose of prednisone. No emphysema changes seen on her CTPA.  Tobacco abuse Advised her to stop smoking.  Morbid obesity (HCC) Chronic.  Chronic diastolic CHF (congestive heart failure) (HCC) Stable. Continue with GDMT.  OSA (obstructive sleep apnea) Wear CPAP at night. Currently with sinus infection. Cannot wear CPAP due to sinus pressure from CPAP.  DVT prophylaxis: Lovenox Code Status: Full Code Family Communication: no family at bedside  Disposition Plan: return home  Consults called: none  Admission status: Observation, Telemetry bed   Kristopher Oppenheim, DO [facility] 12/11/2021, 11:23 PM

## 2021-12-11 NOTE — Assessment & Plan Note (Signed)
Wear CPAP at night. Currently with sinus infection. Cannot wear CPAP due to sinus pressure from CPAP.

## 2021-12-11 NOTE — Assessment & Plan Note (Addendum)
No prior hx of asthma or COPD. Pt does smoke. Treat as if she has asthma exacerbation/copd exacerbation. No productive cough. No indication for abx. Continue with po prednisone. Will need home nebulizer machine at discharge. Will need formal PFTs in the office in 4 week after last dose of prednisone. No emphysema changes seen on her CTPA.

## 2021-12-11 NOTE — ED Provider Notes (Signed)
Emergency Medicine Provider Triage Evaluation Note  Amber Bentley , a 45 y.o. adult  was evaluated in triage.  Pt complains of shortness of breath.  That she was feeling unwell all throughout the day and then after waking from a nap at approximately 1500 was feeling short of breath and was having chest pain.  Describes chest pain as a aching and states that his located midsternal and does not radiate.  Patient checked her oxygen saturation and found to be 79% on room air.  Patient states that she has home oxygen prescribed but does not use it.  Review of Systems  Positive: Chest pain, shortness of breath, fever, chills, nonproductive cough Negative: Neck swelling or tenderness, abdominal pain, nausea, vomiting, diaphoresis  Physical Exam  BP (!) 201/89 (BP Location: Right Arm)    Pulse 88    Temp (!) 101.4 F (38.6 C) (Oral)    Resp (!) 30    Ht 5' 6.5" (1.689 m)    Wt (!) 156 kg    SpO2 94%    BMI 54.69 kg/m  Gen:   Awake, no distress   Resp:  Increased effort of breathing, wheezing noted to all lung fields MSK:   Moves extremities without difficulty; no swelling or tenderness of bilateral lower extremity Other:    Medical Decision Making  Medically screening exam initiated at 6:05 PM.  Appropriate orders placed.  [name] was informed that the remainder of the evaluation will be completed by another provider, this initial triage assessment does not replace that evaluation, and the importance of remaining in the ED until their evaluation is complete.     Haskel Schroeder, PA-C 12/11/21 1806    Sloan Leiter, DO 12/13/21 0017

## 2021-12-12 DIAGNOSIS — E739 Lactose intolerance, unspecified: Secondary | ICD-10-CM | POA: Diagnosis present

## 2021-12-12 DIAGNOSIS — G4733 Obstructive sleep apnea (adult) (pediatric): Secondary | ICD-10-CM | POA: Diagnosis present

## 2021-12-12 DIAGNOSIS — Z8711 Personal history of peptic ulcer disease: Secondary | ICD-10-CM | POA: Diagnosis not present

## 2021-12-12 DIAGNOSIS — F419 Anxiety disorder, unspecified: Secondary | ICD-10-CM | POA: Diagnosis present

## 2021-12-12 DIAGNOSIS — Z20822 Contact with and (suspected) exposure to covid-19: Secondary | ICD-10-CM | POA: Diagnosis present

## 2021-12-12 DIAGNOSIS — F32A Depression, unspecified: Secondary | ICD-10-CM | POA: Diagnosis present

## 2021-12-12 DIAGNOSIS — J9601 Acute respiratory failure with hypoxia: Secondary | ICD-10-CM | POA: Diagnosis present

## 2021-12-12 DIAGNOSIS — K219 Gastro-esophageal reflux disease without esophagitis: Secondary | ICD-10-CM | POA: Diagnosis present

## 2021-12-12 DIAGNOSIS — Z90710 Acquired absence of both cervix and uterus: Secondary | ICD-10-CM | POA: Diagnosis not present

## 2021-12-12 DIAGNOSIS — I5042 Chronic combined systolic (congestive) and diastolic (congestive) heart failure: Secondary | ICD-10-CM | POA: Diagnosis present

## 2021-12-12 DIAGNOSIS — Z833 Family history of diabetes mellitus: Secondary | ICD-10-CM | POA: Diagnosis not present

## 2021-12-12 DIAGNOSIS — B348 Other viral infections of unspecified site: Secondary | ICD-10-CM | POA: Diagnosis present

## 2021-12-12 DIAGNOSIS — E785 Hyperlipidemia, unspecified: Secondary | ICD-10-CM | POA: Diagnosis present

## 2021-12-12 DIAGNOSIS — Z818 Family history of other mental and behavioral disorders: Secondary | ICD-10-CM | POA: Diagnosis not present

## 2021-12-12 DIAGNOSIS — I272 Pulmonary hypertension, unspecified: Secondary | ICD-10-CM | POA: Diagnosis present

## 2021-12-12 DIAGNOSIS — E278 Other specified disorders of adrenal gland: Secondary | ICD-10-CM | POA: Diagnosis present

## 2021-12-12 DIAGNOSIS — J45901 Unspecified asthma with (acute) exacerbation: Secondary | ICD-10-CM | POA: Diagnosis present

## 2021-12-12 DIAGNOSIS — R7303 Prediabetes: Secondary | ICD-10-CM | POA: Diagnosis present

## 2021-12-12 DIAGNOSIS — Z8249 Family history of ischemic heart disease and other diseases of the circulatory system: Secondary | ICD-10-CM | POA: Diagnosis not present

## 2021-12-12 DIAGNOSIS — I110 Hypertensive heart disease with heart failure: Secondary | ICD-10-CM | POA: Diagnosis present

## 2021-12-12 DIAGNOSIS — Z6841 Body Mass Index (BMI) 40.0 and over, adult: Secondary | ICD-10-CM | POA: Diagnosis not present

## 2021-12-12 DIAGNOSIS — Z83438 Family history of other disorder of lipoprotein metabolism and other lipidemia: Secondary | ICD-10-CM | POA: Diagnosis not present

## 2021-12-12 DIAGNOSIS — R0602 Shortness of breath: Secondary | ICD-10-CM | POA: Diagnosis present

## 2021-12-12 DIAGNOSIS — F17210 Nicotine dependence, cigarettes, uncomplicated: Secondary | ICD-10-CM | POA: Diagnosis present

## 2021-12-12 DIAGNOSIS — Z720 Tobacco use: Secondary | ICD-10-CM | POA: Diagnosis not present

## 2021-12-12 LAB — RESPIRATORY PANEL BY PCR

## 2021-12-12 LAB — HIV ANTIBODY (ROUTINE TESTING W REFLEX): HIV Screen 4th Generation wRfx: NONREACTIVE

## 2021-12-12 MED ORDER — METHYLPREDNISOLONE SODIUM SUCC 40 MG IJ SOLR
40.0000 mg | Freq: Two times a day (BID) | INTRAMUSCULAR | Status: DC
  Administered 2021-12-12 – 2021-12-13 (×3): 40 mg via INTRAVENOUS
  Filled 2021-12-12 (×3): qty 1

## 2021-12-12 MED ORDER — BENZONATATE 100 MG PO CAPS
200.0000 mg | ORAL_CAPSULE | Freq: Three times a day (TID) | ORAL | Status: DC
  Administered 2021-12-12 – 2021-12-13 (×4): 200 mg via ORAL
  Filled 2021-12-12 (×4): qty 2

## 2021-12-12 MED ORDER — ENOXAPARIN SODIUM 80 MG/0.8ML IJ SOSY
75.0000 mg | PREFILLED_SYRINGE | INTRAMUSCULAR | Status: DC
  Administered 2021-12-13: 11:00:00 75 mg via SUBCUTANEOUS
  Filled 2021-12-12 (×2): qty 0.8

## 2021-12-12 MED ORDER — ADULT MULTIVITAMIN W/MINERALS CH
1.0000 | ORAL_TABLET | Freq: Every day | ORAL | Status: DC
  Administered 2021-12-12 – 2021-12-13 (×2): 1 via ORAL
  Filled 2021-12-12 (×2): qty 1

## 2021-12-12 MED ORDER — AZITHROMYCIN 250 MG PO TABS
250.0000 mg | ORAL_TABLET | Freq: Every day | ORAL | Status: DC
  Administered 2021-12-13: 11:00:00 250 mg via ORAL
  Filled 2021-12-12: qty 1

## 2021-12-12 MED ORDER — AZITHROMYCIN 250 MG PO TABS
500.0000 mg | ORAL_TABLET | Freq: Every day | ORAL | Status: AC
  Administered 2021-12-12: 13:00:00 500 mg via ORAL
  Filled 2021-12-12: qty 2

## 2021-12-12 NOTE — Progress Notes (Signed)
SATURATION QUALIFICATIONS: (This note is used to comply with regulatory documentation for home oxygen)  Patient Saturations on Room Air at Rest = 89%  Patient Saturations on Room Air while Ambulating = 85%  Patient Saturations on 4 Liters of oxygen while Ambulating = 94%  Please briefly explain why patient needs home oxygen: Pt desats when ambulating and requires O2 to maintain appropriate saturation

## 2021-12-12 NOTE — Progress Notes (Signed)
Initial Nutrition Assessment  DOCUMENTATION CODES:  Morbid obesity  INTERVENTION:  Recommend liberalizing diet to 2 gram sodium.  Add Magic cup TID with meals, each supplement provides 290 kcal and 9 grams of protein.  Add MVI with minerals daily.  Encourage PO and supplement intake.  NUTRITION DIAGNOSIS:  Inadequate oral intake related to decreased appetite as evidenced by per patient/family report.  GOAL:  Patient will meet greater than or equal to 90% of their needs  MONITOR:  PO intake, Supplement acceptance, Diet advancement, Labs, Weight trends, I & O's  REASON FOR ASSESSMENT:  Consult Assessment of nutrition requirement/status  ASSESSMENT:  45 yo nonbinary patient with a PMH of chronic diastolic heart failure, OSA, pulmonary HTN, and tobacco abuse who presents with worsening shortness of breath. Admitted with acute respiratory failure with hypoxia.  Spoke with pt at bedside. Pt reports that they have been feeling ill for the past 2-3 days and they have had no appetite.  Pt reports eating a regular meal at Christmas Eve dinner, but not much the past few days.  Pt denies any significant weight changes recently. Per Epic, pt's weight has remained stable for the last 20 months.  Spoke with pt and MD regarding liberalizing diet to 2 gram sodium from heart healthy given poor PO the past 3 days. MD and pt agreed.  Medications: reviewed; prednisone, spironolactone  Labs: reviewed; Glucose 129 (H) HbA1c: 6.2% (11/30/2021)  NUTRITION - FOCUSED PHYSICAL EXAM: Flowsheet Row Most Recent Value  Orbital Region No depletion  Upper Arm Region No depletion  Thoracic and Lumbar Region No depletion  Buccal Region No depletion  Temple Region No depletion  Clavicle Bone Region No depletion  Clavicle and Acromion Bone Region No depletion  Scapular Bone Region No depletion  Dorsal Hand No depletion  Patellar Region No depletion  Anterior Thigh Region No depletion  Posterior  Calf Region No depletion  Edema (RD Assessment) None  Hair Reviewed  Eyes Reviewed  Mouth Reviewed  Skin Reviewed  Nails Reviewed   Diet Order:   Diet Order             Diet 2 gram sodium Room service appropriate? Yes; Fluid consistency: Thin  Diet effective now                  EDUCATION NEEDS:  Education needs have been addressed  Skin:  Skin Assessment: Reviewed RN Assessment  Last BM:  12/11/21  Height:  Ht Readings from Last 1 Encounters:  12/11/21 5' 6.5" (1.689 m)   Weight:  Wt Readings from Last 1 Encounters:  12/11/21 (!) 156 kg   BMI:  Body mass index is 54.69 kg/m.  Estimated Nutritional Needs:  Kcal:  2100-2300 Protein:  150-165 grams Fluid:  >2.1 L  Vertell Limber, RD, LDN (she/her/hers) Clinical Inpatient Dietitian RD Pager/After-Hours/Weekend Pager # in [place]

## 2021-12-12 NOTE — Progress Notes (Signed)
PROGRESS NOTE  Amber Bentley [id number] DOB: 08-Feb-1976 DOA: 12/11/2021 PCP: Flossie Buffy, NP  HPI/Recap of past 71 hours: 45 year old female with a history of chronic diastolic heart failure, OSA, pulmonary hypertension, morbid obesity, tobacco abuse presents to the ER today with a 1 day history of worsening shortness of breath.  She checked her pulse oximetry at home with a home pulse oximeter.  It read 79%.  She came to the ER for evaluation.   Patient placed on oxygen.  She did have a low-grade temp of 101.4.  CTPA was negative for acute PE.  There is no pneumonia.  There is no emphysematous changes.  There is a indeterminate 18 mm right adrenal nodule.  Admitted due to concern for COPD and possibly asthma exacerbation.  12/12/2021: Patient seen and examined at bedside.  Reports of persistent nonproductive cough.  Associated with audible wheezing.  She failed her home oxygen evaluation with O2 saturation 85% on room air with ambulation.   Assessment/Plan: Principal Problem:   Acute respiratory failure with hypoxia (HCC) Active Problems:   Tobacco abuse   Morbid obesity (HCC)   Chronic diastolic CHF (congestive heart failure) (HCC)   OSA (obstructive sleep apnea)   Wheezing  Acute respiratory failure with hypoxia (HCC) concern for possible undiagnosed asthma with exacerbation Presented with hypoxia with O2 saturation in the low 80s, diffuse wheezing on exam, CT chest nonacute. COVID-19, influenza A, influenza B all negative. Obtain RSV Will need to follow-up with pulmonary for PFT outpatient Continue IV steroids, Solu-Medrol 40 mg twice daily DuoNeb every 6 hours Add Z-Pak for its anti-inflammatory properties   Tobacco abuse She is cutting down, now smokes 5 cigarettes/day. Tobacco use since the age of 55 Tobacco cessation counseling at bedside.   Morbid obesity ([place]) BMI 54 Recommend weight loss outpatient with regular physical activity and healthy  dieting.   Chronic diastolic CHF (congestive heart failure) (HCC) Stable. Continue with GDMT. Euvolemic on exam BNP 40 on 12/11/2021 Strict I's and O's and daily weight   OSA (obstructive sleep apnea) Continue CPAP at night.  18 mm right adrenal nodule, possible benign adrenal adenoma, incidental finding. Per radiology report, consider further evaluation with non emergent adrenal protocol CT with and without contrast.    DVT prophylaxis: Lovenox subcu daily Code Status: Full Code Family Communication: no family at bedside  Disposition Plan: return home possibly on 12/13/2021. Consults called: none  Admission status: Inpatient.  Patient requires at least 2 midnights for further evaluation and treatment of present condition.       Objective: Vitals:   12/12/21 0543 12/12/21 0747 12/12/21 0827 12/12/21 0830  BP: (!) 159/78  (!) 188/90   Pulse: 78  80   Resp: 18     Temp: 98.9 F (37.2 C)     TempSrc: Oral     SpO2: (!) 89% 90% 90% (!) 86%  Weight:      Height:       No intake or output data in the 24 hours ending 12/12/21 1320 Filed Weights   12/11/21 1734  Weight: (!) 156 kg    Exam:  General: 45 y.o. year-old adult well developed well nourished in no acute distress.  Alert and oriented x3. Cardiovascular: Regular rate and rhythm with no rubs or gallops.  No thyromegaly or JVD noted.   Respiratory: Diffuse wheezing bilaterally. Good inspiratory effort. Abdomen: Soft nontender nondistended with normal bowel sounds x4 quadrants. Musculoskeletal: No lower extremity edema. 2/4 pulses in all 4  extremities. Skin: No ulcerative lesions noted or rashes, Psychiatry: Mood is appropriate for condition and setting   Data Reviewed: CBC: Recent Labs  Lab 12/11/21 1848  WBC 11.6*  NEUTROABS 9.7*  HGB 14.7  HCT 49.2*  MCV 87.1  PLT 99991111   Basic Metabolic Panel: Recent Labs  Lab 12/11/21 1848  NA 137  K 4.1  CL 101  CO2 28  GLUCOSE 129*  BUN 17  CREATININE  1.01*  CALCIUM 9.2   GFR: Estimated Creatinine Clearance (by C-G formula based on SCr of 1.01 mg/dL (H)) Female: 111.9 mL/min (A) Female: 135.3 mL/min (A) Liver Function Tests: Recent Labs  Lab 12/11/21 1848  AST 24  ALT 32  ALKPHOS 68  BILITOT 0.5  PROT 7.9  ALBUMIN 4.2   No results for input(s): LIPASE, AMYLASE in the last 168 hours. No results for input(s): AMMONIA in the last 168 hours. Coagulation Profile: No results for input(s): INR, PROTIME in the last 168 hours. Cardiac Enzymes: No results for input(s): CKTOTAL, CKMB, CKMBINDEX, TROPONINI in the last 168 hours. BNP (last 3 results) No results for input(s): PROBNP in the last 8760 hours. HbA1C: No results for input(s): HGBA1C in the last 72 hours. CBG: No results for input(s): GLUCAP in the last 168 hours. Lipid Profile: No results for input(s): CHOL, HDL, LDLCALC, TRIG, CHOLHDL, LDLDIRECT in the last 72 hours. Thyroid Function Tests: No results for input(s): TSH, T4TOTAL, FREET4, T3FREE, THYROIDAB in the last 72 hours. Anemia Panel: No results for input(s): VITAMINB12, FOLATE, FERRITIN, TIBC, IRON, RETICCTPCT in the last 72 hours. Urine analysis:    Component Value Date/Time   COLORURINE YELLOW 11/15/2021 1938   APPEARANCEUR CLEAR 11/15/2021 1938   LABSPEC 1.013 11/15/2021 1938   PHURINE 6.5 11/15/2021 1938   GLUCOSEU NEGATIVE 11/15/2021 1938   HGBUR NEGATIVE 11/15/2021 [place] NEGATIVE 11/15/2021 [place] NEGATIVE 11/15/2021 1938   PROTEINUR 30 (A) 11/15/2021 1938   NITRITE NEGATIVE 11/15/2021 1938   LEUKOCYTESUR NEGATIVE 11/15/2021 1938   Sepsis Labs: @LABRCNTIP (procalcitonin:4,lacticidven:4)  ) Recent Results (from the past 240 hour(s))  Resp Panel by RT-PCR (Flu A&B, Covid) Nasopharyngeal Swab     Status: None   Collection Time: 12/11/21  6:45 PM   Specimen: Nasopharyngeal Swab; Nasopharyngeal(NP) swabs in vial transport medium  Result Value Ref Range Status   SARS Coronavirus  2 by RT PCR NEGATIVE NEGATIVE Final    Comment: (NOTE) SARS-CoV-2 target nucleic acids are NOT DETECTED.  The SARS-CoV-2 RNA is generally detectable in upper respiratory specimens during the acute phase of infection. The lowest concentration of SARS-CoV-2 viral copies this assay can detect is 138 copies/mL. A negative result does not preclude SARS-Cov-2 infection and should not be used as the sole basis for treatment or other patient management decisions. A negative result may occur with  improper specimen collection/handling, submission of specimen other than nasopharyngeal swab, presence of viral mutation(s) within the areas targeted by this assay, and inadequate number of viral copies(<138 copies/mL). A negative result must be combined with clinical observations, patient history, and epidemiological information. The expected result is Negative.  Fact Sheet for Patients:  EntrepreneurPulse.com.au  Fact Sheet for Healthcare Providers:  IncredibleEmployment.be  This test is no t yet approved or cleared by the [place] FDA and  has been authorized for detection and/or diagnosis of SARS-CoV-2 by FDA under an Emergency Use Authorization (EUA). This EUA will remain  in effect (meaning this test can be used) for the duration of the  COVID-19 declaration under Section 564(b)(1) of the Act, 21 U.S.C.section 360bbb-3(b)(1), unless the authorization is terminated  or revoked sooner.       Influenza A by PCR NEGATIVE NEGATIVE Final   Influenza B by PCR NEGATIVE NEGATIVE Final    Comment: (NOTE) The Xpert Xpress SARS-CoV-2/FLU/RSV plus assay is intended as an aid in the diagnosis of influenza from Nasopharyngeal swab specimens and should not be used as a sole basis for treatment. Nasal washings and aspirates are unacceptable for Xpert Xpress SARS-CoV-2/FLU/RSV testing.  Fact Sheet for Patients: BloggerCourse.com  Fact  Sheet for Healthcare Providers: SeriousBroker.it  This test is not yet approved or cleared by the [place] FDA and has been authorized for detection and/or diagnosis of SARS-CoV-2 by FDA under an Emergency Use Authorization (EUA). This EUA will remain in effect (meaning this test can be used) for the duration of the COVID-19 declaration under Section 564(b)(1) of the Act, 21 U.S.C. section 360bbb-3(b)(1), unless the authorization is terminated or revoked.  Performed at [facility], [address] [place], [place] [address]       Studies: DG Chest 2 View  Result Date: 12/11/2021 CLINICAL DATA:  Short of breath, hypoxia EXAM: CHEST - 2 VIEW COMPARISON:  04/27/2017 FINDINGS: The heart size and mediastinal contours are within normal limits. Both lungs are clear. The visualized skeletal structures are unremarkable. IMPRESSION: No active cardiopulmonary disease. Electronically Signed   By: Sharlet Salina M.D.   On: 12/11/2021 18:04   CT Angio Chest PE W and/or Wo Contrast  Result Date: 12/11/2021 CLINICAL DATA:  Pulmonary embolus suspected, high probability, chest pain and shortness of breath EXAM: CT ANGIOGRAPHY CHEST WITH CONTRAST TECHNIQUE: Multidetector CT imaging of the chest was performed using the standard protocol during bolus administration of intravenous contrast. Multiplanar CT image reconstructions and MIPs were obtained to evaluate the vascular anatomy. CONTRAST:  OMNIPAQUE IOHEXOL 350 MG/ML SOLN COMPARISON:  None. FINDINGS: Cardiovascular: Satisfactory opacification of the pulmonary arteries to the proximal segmental level. No evidence of pulmonary embolism. No thoracic aortic aneurysm or dissection. Normal heart size. No significant pericardial effusion/thickening. Mediastinum/Nodes: No discrete thyroid nodule. No pathologically enlarged mediastinal, hilar or axillary lymph nodes. Lungs/Pleura: No focal airspace  consolidation. No pleural effusion. No pneumothorax. Upper Abdomen: Indeterminate 18 mm right adrenal nodule. No acute abnormality. Musculoskeletal: Thoracic spondylosis. No acute osseous abnormality. Review of the MIP images confirms the above findings. IMPRESSION: 1. No evidence of pulmonary embolism or other acute intrathoracic abnormality. 2. Indeterminate 18 mm right adrenal nodule, incompletely evaluated on this examination but statistically likely to reflect a benign adrenal adenoma. Consider further evaluation with nonemergent adrenal protocol CT with and without contrast. Electronically Signed   By: Maudry Mayhew M.D.   On: 12/11/2021 22:55    Scheduled Meds:  [START ON 12/13/2021] azithromycin  250 mg Oral Daily   benzonatate  200 mg Oral TID   carvedilol  25 mg Oral BID WC   enoxaparin (LOVENOX) injection  75 mg Subcutaneous Q24H   hydrALAZINE  50 mg Oral TID   ipratropium-albuterol  3 mL Nebulization Q6H   losartan  100 mg Oral Daily   methylPREDNISolone (SOLU-MEDROL) injection  40 mg Intravenous Q12H   multivitamin with minerals  1 tablet Oral Daily   spironolactone  50 mg Oral Daily    Continuous Infusions:   LOS: 0 days     Darlin Drop, MD [facility] Pager [phone]  If 7PM-7AM, please contact night-coverage [email or web address] Password [facility] 12/12/2021,  1:20 PM

## 2021-12-12 NOTE — Progress Notes (Signed)
Patients home CPAP set up with sterile water at bedside with 5 Lpm O2 bleed in. Plugged into red outlet. Patient states they can place on/off as needed. Informed patient to call in need any further assistance.

## 2021-12-12 NOTE — TOC Initial Note (Signed)
Transition of Care [facility]) - Initial/Assessment Note    Patient Details  Name: Amber Bentley MRN: [id number] Date of Birth: [birth year]  Transition of [facility]) CM/SW Contact:    Lanier Clam, RN Phone Number: 12/12/2021, 11:47 AM  Clinical Narrative: Noted qualifies for home 02, ordered for neb machine-no preference for DME co-[employer] rep Danielle-contacted to deliver neb machine to rm prior d/c-await home 02 order if appropriate-[employer] to provide CPAP tubing also for patient. Continue to monitor for d/c needs.Has own transport home.                  Expected Discharge Plan: Home/Self Care Barriers to Discharge: Continued Medical Work up   Patient Goals and CMS Choice Patient states their goals for this hospitalization and ongoing recovery are:: go home CMS Medicare.gov Compare Post Acute Care list provided to:: Patient Choice offered to / list presented to : Patient  Expected Discharge Plan and Services Expected Discharge Plan: Home/Self Care   Discharge Planning Services: CM Consult Post Acute Care Choice: Durable Medical Equipment Living arrangements for the past 2 months: Single Family Home                 DME Arranged: Nebulizer machine DME Agency: [employer] Date DME Agency Contacted: 12/12/21 Time DME Agency Contacted: 1146 Representative spoke with at DME Agency: Duwayne Heck            Prior Living Arrangements/Services Living arrangements for the past 2 months: Single Family Home Lives with:: Spouse Patient language and need for interpreter reviewed:: Yes Do you feel safe going back to the place where you live?: Yes      Need for Family Participation in Patient Care: No (Comment) Care giver support system in place?: Yes (comment) Current home services: DME (CPAP)    Activities of Daily Living Home Assistive Devices/Equipment: CPAP ADL Screening (condition at time of admission) Patient's cognitive ability adequate to safely complete daily  activities?: Yes Is the patient deaf or have difficulty hearing?: No Does the patient have difficulty seeing, even when wearing glasses/contacts?: No Does the patient have difficulty concentrating, remembering, or making decisions?: No Patient able to express need for assistance with ADLs?: Yes Does the patient have difficulty dressing or bathing?: No Independently performs ADLs?: Yes (appropriate for developmental age) Does the patient have difficulty walking or climbing stairs?: No Weakness of Legs: None Weakness of Arms/Hands: None  Permission Sought/Granted Permission sought to share information with : Case Manager Permission granted to share information with : Yes, Verbal Permission Granted              Emotional Assessment Appearance:: Appears stated age   Affect (typically observed): Accepting Orientation: : Oriented to Self, Oriented to Place, Oriented to  Time, Oriented to Situation Alcohol / Substance Use: Not Applicable Psych Involvement: No (comment)  Admission diagnosis:  COPD exacerbation (HCC) [J44.1] Acute respiratory failure with hypoxia (HCC) [J96.01] Patient Active Problem List   Diagnosis Date Noted   Acute respiratory failure with hypoxia (HCC) 12/11/2021   Wheezing 12/11/2021   Vitamin D deficiency 01/14/2018   Prediabetes 01/14/2018   OSA (obstructive sleep apnea) 07/20/2017   Nasal sore 07/18/2017   Chronic diastolic CHF (congestive heart failure) (HCC) 03/01/2017   Pulmonary hypertension (HCC) 03/01/2017   Morbid obesity (HCC)    SOB (shortness of breath)    Migraine without status migrainosus, not intractable    Hypertensive cardiomyopathy, 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HCC)    Congestive heart failure (HCC)    Adjustment  disorder with mixed anxiety and depressed mood 02/21/2017   Acute combined systolic and diastolic congestive heart failure (HCC) 02/21/2017   Tobacco abuse 02/21/2017   Chest pain 02/21/2017   Anxiety and depression 02/21/2017   PCP:   Anne Ng, NP Pharmacy:   [facility] [address] [place] [address] Phone: [phone] Fax: [phone]     Social Determinants of Health (SDOH) Interventions    Readmission Risk Interventions No flowsheet data found.

## 2021-12-12 NOTE — Progress Notes (Signed)
SATURATION QUALIFICATIONS: (This note is used to comply with regulatory documentation for home oxygen)  Patient Saturations on Room Air at Rest = 87%  Patient Saturations on Room Air while Ambulating = 84%  Patient Saturations on 3 Liters of oxygen while Ambulating = 92%  Please briefly explain why patient needs home oxygen: Pt desaturates when ambulating requiring oxygen

## 2021-12-13 ENCOUNTER — Other Ambulatory Visit (HOSPITAL_COMMUNITY): Payer: Self-pay

## 2021-12-13 DIAGNOSIS — E278 Other specified disorders of adrenal gland: Secondary | ICD-10-CM

## 2021-12-13 DIAGNOSIS — I5042 Chronic combined systolic (congestive) and diastolic (congestive) heart failure: Secondary | ICD-10-CM

## 2021-12-13 DIAGNOSIS — J45901 Unspecified asthma with (acute) exacerbation: Secondary | ICD-10-CM

## 2021-12-13 MED ORDER — ALBUTEROL SULFATE HFA 108 (90 BASE) MCG/ACT IN AERS
2.0000 | INHALATION_SPRAY | RESPIRATORY_TRACT | 0 refills | Status: DC | PRN
  Filled 2021-12-13: qty 8.5, 16d supply, fill #0

## 2021-12-13 MED ORDER — PREDNISONE 50 MG PO TABS
50.0000 mg | ORAL_TABLET | Freq: Every day | ORAL | 0 refills | Status: AC
  Filled 2021-12-13: qty 3, 3d supply, fill #0

## 2021-12-13 NOTE — Discharge Summary (Signed)
Physician Discharge Summary  Amber Bentley NTI:[id number] DOB: 05-31-1976 DOA: 12/11/2021  PCP: Anne Ng, NP  Admit date: 12/11/2021 Discharge date: 12/13/2021 Admitted From: Home Disposition: Home Recommendations for Outpatient Follow-up:  Follow ups as below. Please obtain CBC/BMP/Mag at follow up Please follow up on the following pending results: None Home Health: Not indicated Equipment/Devices: Home oxygen, 3 L by [place] Discharge Condition: Stable CODE STATUS: Full code  Follow-up Information     [facility] Follow up.   Why: nebulizer machine Contact information: [address] [place] [address] [phone]         Nche, Bonna Gains, NP. Schedule an appointment as soon as possible for a visit in 1 week(s).   Specialty: Internal Medicine Contact information: [address] [place] [address] [phone]                Hospital Course: 45 year old F with PMH of combined CHF with recovered EF, PAH, OSA on CPAP, morbid obesity, tobacco use disorder and reactive airway disease presenting with progressive shortness of breath, hypoxemia to 79% and dry cough, and admitted for acute respiratory failure with hypoxia due to possible asthma exacerbation in the setting of rhinovirus infection.  RVP was positive for rhinovirus. CTA chest without acute finding.  Patient was started on IV Solu-Medrol, azithromycin and nebulizers with improvement in symptoms.  However, patient required 3 L to maintain appropriate saturation in low 90s.  Discharged on home oxygen, p.o. prednisone, home Symbicort and as needed albuterol.  Ambulatory referral to pulmonology ordered for PFT.   See individual problem list below for more hospital course. See individual problem list below for more on hospital course.  Discharge Diagnoses:  Acute respiratory failure with hypoxia due to possible asthma exacerbation in the setting of rhinovirus  infection -Improved. -Discharged on home oxygen, p.o. prednisone, Symbicort and as needed albuterol -Encouraged to quit smoking cigarette -Ambulatory referral to pulmonology for PFT  Chronic combined CHF with recovered EF: Appears euvolemic on exam. -Continue home meds  OSA on CPAP -Continue nightly CPAP.  Adrenal nodule: CT chest showed indeterminate 80 mm right adrenal nodule.  Likely benign. -Nonemergent CT with and without contrast  Morbid obesity -Encourage lifestyle change to lose weight. Body mass index is 54.69 kg/m. Nutrition Problem: Inadequate oral intake Etiology: decreased appetite Signs/Symptoms: per patient/family report Interventions: Magic cup, MVI, Liberalize Diet     Discharge Exam: Vitals:   12/12/21 2123 12/12/21 2319 12/13/21 0613 12/13/21 0740  BP: (!) 146/61  (!) 148/68   Pulse: 74  71   Temp: 97.6 F (36.4 C)  98 F (36.7 C)   Resp: 18  20   Height:      Weight:      SpO2: 93% 94% 94% 95%  TempSrc:      BMI (Calculated):         GENERAL: No apparent distress.  Nontoxic. HEENT: MMM.  Vision and hearing grossly intact.  NECK: Supple.  No apparent JVD.  RESP: 95% on 3 L at rest.  None No IWOB.  Fair aeration bilaterally. CVS:  RRR. Heart sounds normal.  ABD/GI/GU: Bowel sounds present. Soft. Non tender.  MSK/EXT:  Moves extremities. No apparent deformity. No edema.  SKIN: no apparent skin lesion or wound NEURO: Awake and alert.  Oriented appropriately.  No apparent focal neuro deficit. PSYCH: Calm. Normal affect.   Discharge Instructions  Discharge Instructions     Ambulatory referral to Pulmonology   Complete by: As  directed    Reason for referral: Asthma/COPD   Diet - low sodium heart healthy   Complete by: As directed    Discharge instructions   Complete by: As directed    It has been a pleasure taking care of you!  You were hospitalized due to shortness of breath, cough and low oxygen levels likely due to possible underlying  asthma exacerbation in the setting of viral infection.  Your symptoms improved with treatment.  We are discharging you more steroid and breathing treatments.  We strongly recommend quitting smoking.  We also sent referral to pulmonology for evaluation once he recovers from current acute illness.  Someone will get in touch with you from pulmonology office in the next 1 to 2 weeks to arrange this follow-up.  Please review your new medication list and the directions on your medications before you take them.  It is important that you quit smoking cigarettes.  You may use nicotine patch to help you quit smoking.  Nicotine patch is available over-the-counter.  You may also discuss other options to help you quit smoking with your primary care doctor. You can also talk to professional counselors at 1-800-QUIT-NOW 512-704-0945) for free smoking cessation counseling.     Take care,   Increase activity slowly   Complete by: As directed       Allergies as of 12/13/2021       Reactions   Food Anaphylaxis, Other (See Comments)   Pt is allergic to celery.    Aleve [naproxen] Other (See Comments)   Pt states that this medication makes her loopy.         Medication List     TAKE these medications    albuterol 108 (90 Base) MCG/ACT inhaler Commonly known as: VENTOLIN HFA Inhale 2 puffs into the lungs every 2 (two) hours as needed for wheezing or shortness of breath. (max of 12 puffs per day)   budesonide-formoterol 160-4.5 MCG/ACT inhaler Commonly known as: SYMBICORT Inhale 2 puffs into the lungs 2 (two) times daily.   carvedilol 25 MG tablet Commonly known as: COREG Take 1 tablet (25 mg total) by mouth 2 (two) times daily with a meal.   DAYQUIL PO Take 2 capsules by mouth daily as needed (cold symptoms).   furosemide 20 MG tablet Commonly known as: LASIX Take 1 tablet (20 mg total) by mouth daily. What changed:  when to take this reasons to take this   hydrALAZINE 25 MG  tablet Commonly known as: APRESOLINE Take 2 tablets (50 mg total) by mouth 3 (three) times daily.   losartan 100 MG tablet Commonly known as: COZAAR Take 1 tablet (100 mg total) by mouth daily.   metFORMIN 500 MG tablet Commonly known as: GLUCOPHAGE TAKE 1 TABLET BY MOUTH ONCE DAILY WITH BREAKFAST   Ozempic (0.25 or 0.5 MG/DOSE) 2 MG/1.5ML Sopn Generic drug: Semaglutide(0.25 or 0.5MG /DOS) Inject 0.5 mg into the skin once a week.   pantoprazole 40 MG tablet Commonly known as: PROTONIX TAKE 1 TABLET (40 MG TOTAL) BY MOUTH DAILY.   predniSONE 50 MG tablet Commonly known as: DELTASONE Take 1 tablet (50 mg total) by mouth daily with breakfast for 3 days. Start taking on: December 14, 2021   rosuvastatin 10 MG tablet Commonly known as: CRESTOR TAKE 1 TABLET BY MOUTH ONCE DAILY   spironolactone 50 MG tablet Commonly known as: Aldactone Take 1 tablet (50 mg total) by mouth daily.   vitamin B-12 1000 MCG tablet Commonly known as: CYANOCOBALAMIN Take  1 tablet (1,000 mcg total) by mouth daily.   Vitamin D (Ergocalciferol) 1.25 MG (50000 UNIT) Caps capsule Commonly known as: DRISDOL Take 1 capsule by mouth every 7 days.               Durable Medical Equipment  (From admission, onward)           Start     Ordered   12/12/21 1527  For home use only DME oxygen  Once       Question Answer Comment  Length of Need 6 Months   Mode or (Route) Nasal cannula   Liters per Minute 3   Frequency Continuous (stationary and portable oxygen unit needed)   Oxygen conserving device Yes   Oxygen delivery system Gas      12/12/21 1526   12/12/21 1439  For home use only DME Nebulizer machine  Once       Question Answer Comment  Patient needs a nebulizer to treat with the following condition COPD exacerbation (HCC)   Length of Need 6 Months      12/12/21 1438            Consultations: None  Procedures/Studies:   DG Chest 2 View  Result Date: 12/11/2021 CLINICAL  DATA:  Short of breath, hypoxia EXAM: CHEST - 2 VIEW COMPARISON:  04/27/2017 FINDINGS: The heart size and mediastinal contours are within normal limits. Both lungs are clear. The visualized skeletal structures are unremarkable. IMPRESSION: No active cardiopulmonary disease. Electronically Signed   By: Sharlet Salina M.D.   On: 12/11/2021 18:04   CT Head Wo Contrast  Result Date: 11/15/2021 CLINICAL DATA:  A 45 year old female presents with uncontrolled hypertension and headaches. EXAM: CT HEAD WITHOUT CONTRAST TECHNIQUE: Contiguous axial images were obtained from the base of the skull through the vertex without intravenous contrast. COMPARISON:  February 24, 2017. FINDINGS: Brain: No evidence of acute infarction, hemorrhage, hydrocephalus, extra-axial collection or mass lesion/mass effect. Vascular: No hyperdense vessel or unexpected calcification. Skull: Normal. Negative for fracture or focal lesion. Sinuses/Orbits: Visualized paranasal sinuses and orbits are unremarkable. Other: None IMPRESSION: No acute intracranial abnormality. Electronically Signed   By: Donzetta Kohut M.D.   On: 11/15/2021 19:51   CT Angio Chest PE W and/or Wo Contrast  Result Date: 12/11/2021 CLINICAL DATA:  Pulmonary embolus suspected, high probability, chest pain and shortness of breath EXAM: CT ANGIOGRAPHY CHEST WITH CONTRAST TECHNIQUE: Multidetector CT imaging of the chest was performed using the standard protocol during bolus administration of intravenous contrast. Multiplanar CT image reconstructions and MIPs were obtained to evaluate the vascular anatomy. CONTRAST:  OMNIPAQUE IOHEXOL 350 MG/ML SOLN COMPARISON:  None. FINDINGS: Cardiovascular: Satisfactory opacification of the pulmonary arteries to the proximal segmental level. No evidence of pulmonary embolism. No thoracic aortic aneurysm or dissection. Normal heart size. No significant pericardial effusion/thickening. Mediastinum/Nodes: No discrete thyroid nodule. No  pathologically enlarged mediastinal, hilar or axillary lymph nodes. Lungs/Pleura: No focal airspace consolidation. No pleural effusion. No pneumothorax. Upper Abdomen: Indeterminate 18 mm right adrenal nodule. No acute abnormality. Musculoskeletal: Thoracic spondylosis. No acute osseous abnormality. Review of the MIP images confirms the above findings. IMPRESSION: 1. No evidence of pulmonary embolism or other acute intrathoracic abnormality. 2. Indeterminate 18 mm right adrenal nodule, incompletely evaluated on this examination but statistically likely to reflect a benign adrenal adenoma. Consider further evaluation with nonemergent adrenal protocol CT with and without contrast. Electronically Signed   By: Maudry Mayhew M.D.   On: 12/11/2021 22:55  The results of significant diagnostics from this hospitalization (including imaging, microbiology, ancillary and laboratory) are listed below for reference.     Microbiology: Recent Results (from the past 240 hour(s))  Resp Panel by RT-PCR (Flu A&B, Covid) Nasopharyngeal Swab     Status: None   Collection Time: 12/11/21  6:45 PM   Specimen: Nasopharyngeal Swab; Nasopharyngeal(NP) swabs in vial transport medium  Result Value Ref Range Status   SARS Coronavirus 2 by RT PCR NEGATIVE NEGATIVE Final    Comment: (NOTE) SARS-CoV-2 target nucleic acids are NOT DETECTED.  The SARS-CoV-2 RNA is generally detectable in upper respiratory specimens during the acute phase of infection. The lowest concentration of SARS-CoV-2 viral copies this assay can detect is 138 copies/mL. A negative result does not preclude SARS-Cov-2 infection and should not be used as the sole basis for treatment or other patient management decisions. A negative result may occur with  improper specimen collection/handling, submission of specimen other than nasopharyngeal swab, presence of viral mutation(s) within the areas targeted by this assay, and inadequate number of  viral copies(<138 copies/mL). A negative result must be combined with clinical observations, patient history, and epidemiological information. The expected result is Negative.  Fact Sheet for Patients:  BloggerCourse.com  Fact Sheet for Healthcare Providers:  SeriousBroker.it  This test is no t yet approved or cleared by the [place] FDA and  has been authorized for detection and/or diagnosis of SARS-CoV-2 by FDA under an Emergency Use Authorization (EUA). This EUA will remain  in effect (meaning this test can be used) for the duration of the COVID-19 declaration under Section 564(b)(1) of the Act, 21 U.S.C.section 360bbb-3(b)(1), unless the authorization is terminated  or revoked sooner.       Influenza A by PCR NEGATIVE NEGATIVE Final   Influenza B by PCR NEGATIVE NEGATIVE Final    Comment: (NOTE) The Xpert Xpress SARS-CoV-2/FLU/RSV plus assay is intended as an aid in the diagnosis of influenza from Nasopharyngeal swab specimens and should not be used as a sole basis for treatment. Nasal washings and aspirates are unacceptable for Xpert Xpress SARS-CoV-2/FLU/RSV testing.  Fact Sheet for Patients: BloggerCourse.com  Fact Sheet for Healthcare Providers: SeriousBroker.it  This test is not yet approved or cleared by the [place] FDA and has been authorized for detection and/or diagnosis of SARS-CoV-2 by FDA under an Emergency Use Authorization (EUA). This EUA will remain in effect (meaning this test can be used) for the duration of the COVID-19 declaration under Section 564(b)(1) of the Act, 21 U.S.C. section 360bbb-3(b)(1), unless the authorization is terminated or revoked.  Performed at [facility], [address] [place], [place] [address]   Respiratory (~20 pathogens) panel by PCR     Status: Abnormal   Collection Time: 12/12/21  2:12 PM    Specimen: Nasopharyngeal Swab; Respiratory  Result Value Ref Range Status   Adenovirus NOT DETECTED NOT DETECTED Final   Coronavirus 229E NOT DETECTED NOT DETECTED Final    Comment: (NOTE) The Coronavirus on the Respiratory Panel, DOES NOT test for the novel  Coronavirus (2019 nCoV)    Coronavirus HKU1 NOT DETECTED NOT DETECTED Final   Coronavirus NL63 NOT DETECTED NOT DETECTED Final   Coronavirus OC43 NOT DETECTED NOT DETECTED Final   Metapneumovirus NOT DETECTED NOT DETECTED Final   Rhinovirus / Enterovirus DETECTED (A) NOT DETECTED Final   Influenza A NOT DETECTED NOT DETECTED Final   Influenza B NOT DETECTED NOT DETECTED Final   Parainfluenza Virus 1 NOT DETECTED  NOT DETECTED Final   Parainfluenza Virus 2 NOT DETECTED NOT DETECTED Final   Parainfluenza Virus 3 NOT DETECTED NOT DETECTED Final   Parainfluenza Virus 4 NOT DETECTED NOT DETECTED Final   Respiratory Syncytial Virus NOT DETECTED NOT DETECTED Final   Bordetella pertussis NOT DETECTED NOT DETECTED Final   Bordetella Parapertussis NOT DETECTED NOT DETECTED Final   Chlamydophila pneumoniae NOT DETECTED NOT DETECTED Final   Mycoplasma pneumoniae NOT DETECTED NOT DETECTED Final    Comment: Performed at [facility], [address] [place], [place] [address]     Labs:  CBC: Recent Labs  Lab 12/11/21 1848  WBC 11.6*  NEUTROABS 9.7*  HGB 14.7  HCT 49.2*  MCV 87.1  PLT 190   BMP &GFR Recent Labs  Lab 12/11/21 1848  NA 137  K 4.1  CL 101  CO2 28  GLUCOSE 129*  BUN 17  CREATININE 1.01*  CALCIUM 9.2   Estimated Creatinine Clearance (by C-G formula based on SCr of 1.01 mg/dL (H)) Female: 616.0 mL/min (A) Female: 135.3 mL/min (A) Liver & Pancreas: Recent Labs  Lab 12/11/21 1848  AST 24  ALT 32  ALKPHOS 68  BILITOT 0.5  PROT 7.9  ALBUMIN 4.2   No results for input(s): LIPASE, AMYLASE in the last 168 hours. No results for input(s): AMMONIA in the last 168 hours. Diabetic: No results for  input(s): HGBA1C in the last 72 hours. No results for input(s): GLUCAP in the last 168 hours. Cardiac Enzymes: No results for input(s): CKTOTAL, CKMB, CKMBINDEX, TROPONINI in the last 168 hours. No results for input(s): PROBNP in the last 8760 hours. Coagulation Profile: No results for input(s): INR, PROTIME in the last 168 hours. Thyroid Function Tests: No results for input(s): TSH, T4TOTAL, FREET4, T3FREE, THYROIDAB in the last 72 hours. Lipid Profile: No results for input(s): CHOL, HDL, LDLCALC, TRIG, CHOLHDL, LDLDIRECT in the last 72 hours. Anemia Panel: No results for input(s): VITAMINB12, FOLATE, FERRITIN, TIBC, IRON, RETICCTPCT in the last 72 hours. Urine analysis:    Component Value Date/Time   COLORURINE YELLOW 11/15/2021 1938   APPEARANCEUR CLEAR 11/15/2021 1938   LABSPEC 1.013 11/15/2021 1938   PHURINE 6.5 11/15/2021 1938   GLUCOSEU NEGATIVE 11/15/2021 1938   HGBUR NEGATIVE 11/15/2021 1938   BILIRUBINUR NEGATIVE 11/15/2021 1938   KETONESUR NEGATIVE 11/15/2021 1938   PROTEINUR 30 (A) 11/15/2021 1938   NITRITE NEGATIVE 11/15/2021 1938   LEUKOCYTESUR NEGATIVE 11/15/2021 1938   Sepsis Labs: Invalid input(s): PROCALCITONIN, LACTICIDVEN   Time coordinating discharge: 45 minutes  SIGNED:  Almon Hercules, MD  [facility] 12/13/2021, 5:13 PM

## 2021-12-13 NOTE — Progress Notes (Signed)
Patient was 86% without supplemental oxygen via nasal cannula. Informed Dr. Alanda Slim. Will not need to re-check ambulation test.

## 2021-12-13 NOTE — Progress Notes (Signed)
Discharged patient to home with home oxygen tank and nasal cannula. Patient's sister picked patient up from front entrance in personal vehicle. Discharge instructions went over with patient prior to discharge and I.V. removed without issues.

## 2021-12-14 ENCOUNTER — Telehealth: Payer: Self-pay

## 2021-12-14 NOTE — Telephone Encounter (Signed)
Transition Care Management Follow-up Telephone Call Date of discharge and from where: 12/13/2021 [facility] How have you been since you were released from the hospital? Been doing ok Any questions or concerns? Yes, has neb machine but no meds ordered. Ran out of oxygen, calling to follow up  Items Reviewed: Did the pt receive and understand the discharge instructions provided? Yes  Medications obtained and verified? Yes  Other? No  Any new allergies since your discharge? No  Dietary orders reviewed? Yes Do you have support at home? Yes   Home Care and Equipment/Supplies: Were home health services ordered? no If so, what is the name of the agency? N/a  Has the agency set up a time to come to the patient's home? not applicable Were any new equipment or medical supplies ordered?  Yes: nebulizer and oxygen What is the name of the medical supply agency? [facility] Were you able to get the supplies/equipment? Yes, but ran out of oxygen and has the neb machine, but no medication ordered for it Do you have any questions related to the use of the equipment or supplies? No  Functional Questionnaire: (I = Independent and D = Dependent) ADLs: I  Bathing/Dressing- I  Meal Prep- I  Eating- I  Maintaining continence- I  Transferring/Ambulation- I  Managing Meds- I  Follow up appointments reviewed:  PCP Hospital f/u appt confirmed? Yes  Scheduled to see Alysia Penna on 12/21/2020 @ 11:00. Are transportation arrangements needed? No  If their condition worsens, is the pt aware to call PCP or go to the Emergency Dept.? Yes Was the patient provided with contact information for the PCP's office or ED? Yes Was to pt encouraged to call back with questions or concerns? Yes

## 2021-12-19 ENCOUNTER — Other Ambulatory Visit (HOSPITAL_COMMUNITY): Payer: Self-pay

## 2021-12-21 ENCOUNTER — Other Ambulatory Visit: Payer: Self-pay

## 2021-12-21 ENCOUNTER — Encounter: Payer: Self-pay | Admitting: Nurse Practitioner

## 2021-12-21 ENCOUNTER — Ambulatory Visit: Payer: 59 | Admitting: Nurse Practitioner

## 2021-12-21 ENCOUNTER — Other Ambulatory Visit (HOSPITAL_COMMUNITY): Payer: Self-pay

## 2021-12-21 VITALS — BP 124/80 | HR 64 | Temp 97.4°F | Ht 66.25 in | Wt 346.4 lb

## 2021-12-21 DIAGNOSIS — D72829 Elevated white blood cell count, unspecified: Secondary | ICD-10-CM

## 2021-12-21 DIAGNOSIS — E278 Other specified disorders of adrenal gland: Secondary | ICD-10-CM

## 2021-12-21 DIAGNOSIS — J205 Acute bronchitis due to respiratory syncytial virus: Secondary | ICD-10-CM | POA: Diagnosis not present

## 2021-12-21 DIAGNOSIS — J9601 Acute respiratory failure with hypoxia: Secondary | ICD-10-CM | POA: Diagnosis not present

## 2021-12-21 DIAGNOSIS — D72825 Bandemia: Secondary | ICD-10-CM

## 2021-12-21 LAB — BASIC METABOLIC PANEL
BUN: 17 mg/dL (ref 6–23)
CO2: 34 mEq/L — ABNORMAL HIGH (ref 19–32)
Calcium: 9.3 mg/dL (ref 8.4–10.5)
Chloride: 98 mEq/L (ref 96–112)
Creatinine, Ser: 1.01 mg/dL (ref 0.40–1.20)
GFR: 68.35 mL/min (ref >60.00)
Glucose, Bld: 99 mg/dL (ref 70–99)
Potassium: 4.4 mEq/L (ref 3.5–5.1)
Sodium: 138 mEq/L (ref 135–145)

## 2021-12-21 LAB — CBC
HCT: 42.9 % (ref 36.0–46.0)
Hemoglobin: 13.4 g/dL (ref 12.0–15.0)
MCHC: 31.2 g/dL (ref 30.0–36.0)
MCV: 84 fl (ref 78.0–100.0)
Platelets: 168 10*3/uL (ref 150.0–400.0)
RBC: 5.1 Mil/uL (ref 3.87–5.11)
RDW: 13.9 % (ref 11.5–15.5)
WBC: 8.9 10*3/uL (ref 4.0–10.5)

## 2021-12-21 LAB — MAGNESIUM: Magnesium: 2.1 mg/dL (ref 1.5–2.5)

## 2021-12-21 MED ORDER — ALBUTEROL SULFATE (2.5 MG/3ML) 0.083% IN NEBU
2.5000 mg | INHALATION_SOLUTION | Freq: Four times a day (QID) | RESPIRATORY_TRACT | 0 refills | Status: DC | PRN
  Filled 2021-12-21: qty 90, 7d supply, fill #0

## 2021-12-21 MED ORDER — BUDESONIDE-FORMOTEROL FUMARATE 80-4.5 MCG/ACT IN AERO
2.0000 | INHALATION_SPRAY | Freq: Two times a day (BID) | RESPIRATORY_TRACT | 0 refills | Status: DC
  Filled 2021-12-21: qty 10.2, 30d supply, fill #0

## 2021-12-21 NOTE — Patient Instructions (Addendum)
Go to lab for blood draw Maintain upcoming appt.

## 2021-12-21 NOTE — Progress Notes (Signed)
Subjective:  Patient ID: Amber Bentley, adult    DOB: 10-10-1976  Age: 46 y.o. MRN: [id number]  CC: Hospitalization Follow-up (F/u from hospitalization due to RSV. /Pt c/o cough and sore chest due to the coughing, along with lightheadedness )  HPI Acute bronchitis and hypoxia: Hospital stay 12/12/21 to 12/13/21. Reviewed hospital summary, lab results and radiology report. She was treate for acute hypoxia secondary to RSV. She was discharge with supplemental oxygen but she reports this is only used at bedtime with CPAP machine. She also reports persistent non-productive cough and chest tightness, worse at bedtime. Some improvement with robitussin-DM. Not sure how to use albuterol inhaler, she is not using symbicort at this time either. She does not have appt with pulmonology. She denies any fever or PND, and Resolved SOB. Denies use of any tobacco product since hospital discharge  Wt Readings from Last 3 Encounters:  12/21/21 (!) 346 lb 6.4 oz (157.1 kg)  12/11/21 (!) 344 lb (156 kg)  11/30/21 (!) 344 lb 6.4 oz (156.2 kg)    BP Readings from Last 3 Encounters:  12/21/21 124/80  12/13/21 (!) 148/68  11/30/21 126/88    Adrenal nodule (HCC) First noted on CT chest 12/11/2021:Indeterminate 18 mm right adrenal nodule, incompletely evaluated on this examination but statistically likely to reflect a benign adrenal adenoma. Consider further evaluation with non emergent adrenal protocol CT with and without contrast.  Normal BP and no hypokalemia Entered referral to endocrinology for additional eval   Reconciled med list with patient. Outpatient Medications Prior to Visit  Medication Sig Dispense Refill   albuterol (VENTOLIN HFA) 108 (90 Base) MCG/ACT inhaler Inhale 2 puffs into the lungs every 2 (two) hours as needed for wheezing or shortness of breath. (max of 12 puffs per day) 8.5 g 0   carvedilol (COREG) 25 MG tablet Take 1 tablet (25 mg total) by mouth 2 (two) times daily with a  meal. 180 tablet 3   furosemide (LASIX) 20 MG tablet Take 1 tablet (20 mg total) by mouth daily. (Patient taking differently: Take 20 mg by mouth daily as needed for fluid or edema.) 30 tablet 2   hydrALAZINE (APRESOLINE) 25 MG tablet Take 2 tablets (50 mg total) by mouth 3 (three) times daily. 90 tablet 2   losartan (COZAAR) 100 MG tablet Take 1 tablet (100 mg total) by mouth daily. 90 tablet 3   pantoprazole (PROTONIX) 40 MG tablet TAKE 1 TABLET (40 MG TOTAL) BY MOUTH DAILY. 90 tablet 0   Pseudoephedrine-APAP-DM (DAYQUIL PO) Take 2 capsules by mouth daily as needed (cold symptoms).     rosuvastatin (CRESTOR) 10 MG tablet TAKE 1 TABLET BY MOUTH ONCE DAILY 90 tablet 3   Semaglutide,0.25 or 0.5MG /DOS, (OZEMPIC, 0.25 OR 0.5 MG/DOSE,) 2 MG/1.5ML SOPN Inject 0.5 mg into the skin once a week. 1.5 mL 0   spironolactone (ALDACTONE) 50 MG tablet Take 1 tablet (50 mg total) by mouth daily. 90 tablet 3   vitamin B-12 (CYANOCOBALAMIN) 1000 MCG tablet Take 1 tablet (1,000 mcg total) by mouth daily.     Vitamin D, Ergocalciferol, (DRISDOL) 1.25 MG (50000 UNIT) CAPS capsule Take 1 capsule by mouth every 7 days. 12 capsule 0   budesonide-formoterol (SYMBICORT) 160-4.5 MCG/ACT inhaler Inhale 2 puffs into the lungs 2 (two) times daily.     metFORMIN (GLUCOPHAGE) 500 MG tablet TAKE 1 TABLET BY MOUTH ONCE DAILY WITH BREAKFAST 30 tablet 2   No facility-administered medications prior to visit.    ROS See  HPI  Objective:  BP 124/80 (BP Location: Left Arm, Patient Position: Sitting, Cuff Size: Large)    Pulse 64    Temp (!) 97.4 F (36.3 C) (Temporal)    Ht 5' 6.25" (1.683 m)    Wt (!) 346 lb 6.4 oz (157.1 kg)    SpO2 96%    BMI 55.49 kg/m  No supplemental oxygen use during appt today.  Physical Exam Constitutional:      Appearance: Amber Bentley is obese.  Cardiovascular:     Rate and Rhythm: Normal rate and regular rhythm.     Pulses: Normal pulses.     Heart sounds: Normal heart sounds.   Pulmonary:     Effort: Pulmonary effort is normal.     Breath sounds: Normal breath sounds.  Neurological:     Mental Status: Amber Bentley is alert and oriented to person, place, and time.   Assessment & Plan:  This visit occurred during the SARS-CoV-2 public health emergency.  Safety protocols were in place, including screening questions prior to the visit, additional usage of staff PPE, and extensive cleaning of exam room while observing appropriate contact time as indicated for disinfecting solutions.   Amber Bentley was seen today for hospitalization follow-up.  Diagnoses and all orders for this visit:  Acute respiratory failure with hypoxia (HCC)  Acute bronchitis due to respiratory syncytial virus (RSV) -     Basic metabolic panel -     Ambulatory referral to Pulmonology -     albuterol (PROVENTIL) (2.5 MG/3ML) 0.083% nebulizer solution; Inhale 1 vial via nebulizer every 6 (six) hours as needed for wheezing or shortness of breath. -     budesonide-formoterol (SYMBICORT) 80-4.5 MCG/ACT inhaler; Inhale 2 puffs into the lungs 2 (two) times daily. Rinse mouth after each use  Leukocytosis, unspecified type -     CBC  Hypomagnesemia -     Magnesium  Provided instructions on how to use hand held inhaler. She demonstrated understanding via teach back method. Repeat cbc, bmp and magnesium: normal  Problem List Items Addressed This Visit       Respiratory   Acute respiratory failure with hypoxia (HCC) - Primary   Other Visit Diagnoses     Acute bronchitis due to respiratory syncytial virus (RSV)       Relevant Medications   albuterol (PROVENTIL) (2.5 MG/3ML) 0.083% nebulizer solution   budesonide-formoterol (SYMBICORT) 80-4.5 MCG/ACT inhaler   Other Relevant Orders   Basic metabolic panel (Completed)   Ambulatory referral to Pulmonology   Leukocytosis, unspecified type       Relevant Orders   CBC (Completed)   Hypomagnesemia       Relevant Orders   Magnesium (Completed)        Follow-up: Return for maintain upcoming appt for weight management.  Alysia Penna, NP

## 2021-12-25 ENCOUNTER — Encounter: Payer: Self-pay | Admitting: Nurse Practitioner

## 2021-12-25 ENCOUNTER — Telehealth: Payer: Self-pay | Admitting: Nurse Practitioner

## 2021-12-25 NOTE — Telephone Encounter (Signed)
LVM on patient personal mailbox informing her to expect a call from Endocrinology

## 2021-12-25 NOTE — Assessment & Plan Note (Addendum)
First noted on CT chest 12/11/2021:Indeterminate 18 mm right adrenal nodule, incompletely evaluated on this examination but statistically likely to reflect a benign adrenal adenoma. Consider further evaluation with non emergent adrenal protocol CT with and without contrast.  Normal BP and no hypokalemia Entered referral to endocrinology for additional eval

## 2021-12-29 DIAGNOSIS — I5032 Chronic diastolic (congestive) heart failure: Secondary | ICD-10-CM | POA: Diagnosis not present

## 2021-12-29 DIAGNOSIS — I159 Secondary hypertension, unspecified: Secondary | ICD-10-CM | POA: Diagnosis not present

## 2021-12-29 DIAGNOSIS — I5041 Acute combined systolic (congestive) and diastolic (congestive) heart failure: Secondary | ICD-10-CM | POA: Diagnosis not present

## 2022-01-09 ENCOUNTER — Encounter: Payer: Self-pay | Admitting: Nurse Practitioner

## 2022-01-09 DIAGNOSIS — R1013 Epigastric pain: Secondary | ICD-10-CM

## 2022-01-09 NOTE — Telephone Encounter (Signed)
Stop ozempic injections. Schedule lab appt for CMP and lipase check Advise to maintain clear liquid diet x 24hrs, then advance to Brat diet. If pain is severe, she will need to go to the ED for expedited evaluation

## 2022-01-10 ENCOUNTER — Other Ambulatory Visit (INDEPENDENT_AMBULATORY_CARE_PROVIDER_SITE_OTHER): Payer: 59

## 2022-01-10 ENCOUNTER — Other Ambulatory Visit: Payer: Self-pay

## 2022-01-10 DIAGNOSIS — R1013 Epigastric pain: Secondary | ICD-10-CM | POA: Diagnosis not present

## 2022-01-10 LAB — COMPREHENSIVE METABOLIC PANEL
ALT: 27 U/L (ref 0–35)
AST: 21 U/L (ref 0–37)
Albumin: 4.2 g/dL (ref 3.5–5.2)
Alkaline Phosphatase: 58 U/L (ref 39–117)
BUN: 11 mg/dL (ref 6–23)
CO2: 32 mEq/L (ref 19–32)
Calcium: 9.1 mg/dL (ref 8.4–10.5)
Chloride: 102 mEq/L (ref 96–112)
Creatinine, Ser: 1.05 mg/dL (ref 0.40–1.20)
GFR: 65.21 mL/min (ref >60.00)
Glucose, Bld: 91 mg/dL (ref 70–99)
Potassium: 4.3 mEq/L (ref 3.5–5.1)
Sodium: 140 mEq/L (ref 135–145)
Total Bilirubin: 0.4 mg/dL (ref 0.2–1.2)
Total Protein: 7.2 g/dL (ref 6.0–8.3)

## 2022-01-10 LAB — CBC
HCT: 43.1 % (ref 36.0–46.0)
Hemoglobin: 13.6 g/dL (ref 12.0–15.0)
MCHC: 31.6 g/dL (ref 30.0–36.0)
MCV: 83.3 fl (ref 78.0–100.0)
Platelets: 203 10*3/uL (ref 150.0–400.0)
RBC: 5.18 Mil/uL — ABNORMAL HIGH (ref 3.87–5.11)
RDW: 13.5 % (ref 11.5–15.5)
WBC: 9.9 10*3/uL (ref 4.0–10.5)

## 2022-01-10 LAB — LIPASE: Lipase: 29 U/L (ref 11.0–59.0)

## 2022-01-11 ENCOUNTER — Telehealth: Payer: Self-pay | Admitting: *Deleted

## 2022-01-11 NOTE — Chronic Care Management (AMB) (Signed)
°  Care Management   Note  01/11/2022 Name: [name] MRN: [id number] DOB: 08/28/1976  Amber Bentley is a 46 y.o. year old adult who is a primary care patient of Nche, Bonna Gains, NP and is actively engaged with the care management team. I reached out to [name] by phone today to assist with re-scheduling an initial visit with the RN Case Manager  Follow up plan: Unsuccessful telephone outreach attempt made. A HIPAA compliant phone message was left for the patient providing contact information and requesting a return call.   Burman Nieves, CCMA Care Guide, Embedded Care Coordination [facility]   Care Management  Direct Dial: [phone]

## 2022-01-12 ENCOUNTER — Other Ambulatory Visit: Payer: Self-pay

## 2022-01-12 ENCOUNTER — Ambulatory Visit (INDEPENDENT_AMBULATORY_CARE_PROVIDER_SITE_OTHER): Payer: 59 | Admitting: Pulmonary Disease

## 2022-01-12 ENCOUNTER — Encounter: Payer: Self-pay | Admitting: Pulmonary Disease

## 2022-01-12 VITALS — BP 142/86 | HR 61 | Temp 98.0°F | Ht 66.0 in | Wt 343.4 lb

## 2022-01-12 DIAGNOSIS — I5041 Acute combined systolic (congestive) and diastolic (congestive) heart failure: Secondary | ICD-10-CM | POA: Diagnosis not present

## 2022-01-12 DIAGNOSIS — I159 Secondary hypertension, unspecified: Secondary | ICD-10-CM | POA: Diagnosis not present

## 2022-01-12 DIAGNOSIS — E278 Other specified disorders of adrenal gland: Secondary | ICD-10-CM | POA: Diagnosis not present

## 2022-01-12 DIAGNOSIS — Z8619 Personal history of other infectious and parasitic diseases: Secondary | ICD-10-CM | POA: Diagnosis not present

## 2022-01-12 DIAGNOSIS — I5032 Chronic diastolic (congestive) heart failure: Secondary | ICD-10-CM | POA: Diagnosis not present

## 2022-01-12 NOTE — Progress Notes (Signed)
Synopsis: Referred in Jan 2023 for h/o RSV by Nche, Charlene Brooke, NP  Subjective:   PATIENT ID: Amber Bentley GENDER: nonbinary DOB: 12/14/1976, MRN: [id number]  Chief Complaint  Patient presents with   Consult    Patient is here to make sure her lungs are okay    PMH anxiety, former smoker, smoked for about 20 years, picked it back up during the pandemic, and recently quit again. She is a [employer] and got RSV at the holidays. She ended up in the hospital with RSV, on oxygen for 2 days. She does have OSA on CPAP.  CT scan of the chest also revealed a small adrenal nodule.  From respiratory standpoint she has much better.  She feels much better in general.  She also knows that her weight is a problem and she is working on that.  I encouraged her to continue to follow-up with primary care.   Past Medical History:  Diagnosis Date   Anemia    Anxiety    Cardiomegaly    Chest pain    CHF (congestive heart failure) (HCC)    Constipation    Depression    Dyspnea    Fatigue    Food allergy    celery   GERD (gastroesophageal reflux disease)    Hip pain    HLD (hyperlipidemia)    HTN (hypertension)    Infertility, female    Lactose intolerance    Leg edema    Lower back pain    OSA on CPAP    Palpitations    Prediabetes    Shoulder pain    Stomach ulcer    Varicose vein of leg      Family History  Problem Relation Age of Onset   Hypertension Mother    Cancer Mother        breast cancer   Kidney disease Mother    Depression Mother    Obesity Mother    Breast cancer Mother    Diabetes Father    Heart disease Father 79   Hyperlipidemia Father    Hypertension Father    Depression Father    Anxiety disorder Father    Sleep apnea Father    Obesity Father    Thyroid disease Sister    Sudden death Brother    Cancer Maternal Aunt        breast cancer   Breast cancer Maternal Aunt        in 20's   Cancer Paternal Grandfather        brain cancer    Cancer Maternal Grandmother        breast   Breast cancer Maternal Grandmother    Breast cancer Paternal Grandmother      Past Surgical History:  Procedure Laterality Date   ABDOMINAL HYSTERECTOMY     ENDOMETRIAL ABLATION  2007   prior to hysterectomy    Social History   Socioeconomic History   Marital status: Married    Spouse name: Anderson Malta   Number of children: 2   Years of education: MA   Highest education level: Not on file  Occupational History   Occupation: [employer]  Tobacco Use   Smoking status: Former    Packs/day: 0.25    Years: 10.00    Pack years: 2.50    Types: Cigarettes    Quit date: 12/12/2021    Years since quitting: 0.0   Smokeless tobacco: Never  Vaping Use   Vaping Use: Never  used  Substance and Sexual Activity   Alcohol use: Yes    Comment: rarely   Drug use: No   Sexual activity: Yes    Birth control/protection: Other-see comments    Comment: female partner  Other Topics Concern   Not on file  Social History Narrative   Drinks coffee daily    Social Determinants of Health   Financial Resource Strain: Not on file  Food Insecurity: Not on file  Transportation Needs: Not on file  Physical Activity: Not on file  Stress: Not on file  Social Connections: Not on file  Intimate Partner Violence: Not on file     Allergies  Allergen Reactions   Food Anaphylaxis and Other (See Comments)    Pt is allergic to celery.    Aleve [Naproxen] Other (See Comments)    Pt states that this medication makes her loopy.      Outpatient Medications Prior to Visit  Medication Sig Dispense Refill   albuterol (PROVENTIL) (2.5 MG/3ML) 0.083% nebulizer solution Inhale 1 vial via nebulizer every 6 (six) hours as needed for wheezing or shortness of breath. 150 mL 0   albuterol (VENTOLIN HFA) 108 (90 Base) MCG/ACT inhaler Inhale 2 puffs into the lungs every 2 (two) hours as needed for wheezing or shortness of breath. (max of 12 puffs per day)  8.5 g 0   budesonide-formoterol (SYMBICORT) 80-4.5 MCG/ACT inhaler Inhale 2 puffs into the lungs 2 (two) times daily. Rinse mouth after each use 10.2 g 0   carvedilol (COREG) 25 MG tablet Take 1 tablet (25 mg total) by mouth 2 (two) times daily with a meal. 180 tablet 3   furosemide (LASIX) 20 MG tablet Take 1 tablet (20 mg total) by mouth daily. (Patient taking differently: Take 20 mg by mouth daily as needed for fluid or edema.) 30 tablet 2   hydrALAZINE (APRESOLINE) 25 MG tablet Take 2 tablets (50 mg total) by mouth 3 (three) times daily. 90 tablet 2   losartan (COZAAR) 100 MG tablet Take 1 tablet (100 mg total) by mouth daily. 90 tablet 3   pantoprazole (PROTONIX) 40 MG tablet TAKE 1 TABLET (40 MG TOTAL) BY MOUTH DAILY. 90 tablet 0   Pseudoephedrine-APAP-DM (DAYQUIL PO) Take 2 capsules by mouth daily as needed (cold symptoms).     rosuvastatin (CRESTOR) 10 MG tablet TAKE 1 TABLET BY MOUTH ONCE DAILY 90 tablet 3   Semaglutide,0.25 or 0.5MG /DOS, (OZEMPIC, 0.25 OR 0.5 MG/DOSE,) 2 MG/1.5ML SOPN Inject 0.5 mg into the skin once a week. 1.5 mL 0   spironolactone (ALDACTONE) 50 MG tablet Take 1 tablet (50 mg total) by mouth daily. 90 tablet 3   vitamin B-12 (CYANOCOBALAMIN) 1000 MCG tablet Take 1 tablet (1,000 mcg total) by mouth daily.     Vitamin D, Ergocalciferol, (DRISDOL) 1.25 MG (50000 UNIT) CAPS capsule Take 1 capsule by mouth every 7 days. 12 capsule 0   No facility-administered medications prior to visit.    Review of Systems  Constitutional:  Negative for chills, fever, malaise/fatigue and weight loss.  HENT:  Negative for hearing loss, sore throat and tinnitus.   Eyes:  Negative for blurred vision and double vision.  Respiratory:  Negative for cough, hemoptysis, sputum production, shortness of breath, wheezing and stridor.   Cardiovascular:  Positive for leg swelling. Negative for chest pain, palpitations, orthopnea and PND.  Gastrointestinal:  Negative for abdominal pain,  constipation, diarrhea, heartburn, nausea and vomiting.  Genitourinary:  Negative for dysuria, hematuria and urgency.  Musculoskeletal:  Negative for joint pain and myalgias.  Skin:  Negative for itching and rash.  Neurological:  Negative for dizziness, tingling, weakness and headaches.  Endo/Heme/Allergies:  Negative for environmental allergies. Does not bruise/bleed easily.  Psychiatric/Behavioral:  Negative for depression. The patient is not nervous/anxious and does not have insomnia.   All other systems reviewed and are negative.   Objective:  Physical Exam Vitals reviewed.  Constitutional:      General: Amber Bentley is not in acute distress.    Appearance: Amber Bentley is well-developed. Amber Bentley is obese.  HENT:     Head: Normocephalic and atraumatic.  Eyes:     General: No scleral icterus.    Conjunctiva/sclera: Conjunctivae normal.     Pupils: Pupils are equal, round, and reactive to light.  Neck:     Vascular: No JVD.     Trachea: No tracheal deviation.  Cardiovascular:     Rate and Rhythm: Normal rate and regular rhythm.     Heart sounds: Normal heart sounds. No murmur heard. Pulmonary:     Effort: Pulmonary effort is normal. No tachypnea, accessory muscle usage or respiratory distress.     Breath sounds: No stridor. No wheezing, rhonchi or rales.     Comments: Normal breath sounds Abdominal:     General: There is no distension.     Tenderness: There is no abdominal tenderness.     Comments: Obese pannus  Musculoskeletal:        General: No tenderness.     Cervical back: Neck supple.     Right lower leg: Edema present.     Left lower leg: Edema present.     Comments: Trace bilateral lower extremity edema  Lymphadenopathy:     Cervical: No cervical adenopathy.  Skin:    General: Skin is warm and dry.     Capillary Refill: Capillary refill takes less than 2 seconds.     Findings: No rash.  Neurological:     Mental Status: Amber Bentley is alert and oriented to person, place, and time.  Psychiatric:        Behavior: Behavior normal.     Vitals:   01/12/22 1442  BP: (!) 142/86  Pulse: 61  Temp: 98 F (36.7 C)  TempSrc: Oral  SpO2: 95%  Weight: (!) 343 lb 6.4 oz (155.8 kg)  Height: 5\' 6"  (1.676 m)   95% on RA BMI Readings from Last 3 Encounters:  01/12/22 55.43 kg/m  12/21/21 55.49 kg/m  12/11/21 54.69 kg/m   Wt Readings from Last 3 Encounters:  01/12/22 (!) 343 lb 6.4 oz (155.8 kg)  12/21/21 (!) 346 lb 6.4 oz (157.1 kg)  12/11/21 (!) 344 lb (156 kg)     CBC    Component Value Date/Time   WBC 9.9 01/10/2022 1458   RBC 5.18 (H) 01/10/2022 1458   HGB 13.6 01/10/2022 1458   HGB 14.7 04/13/2020 1558   HCT 43.1 01/10/2022 1458   HCT 46.9 (H) 04/13/2020 1558   PLT 203.0 01/10/2022 1458   PLT 208 04/13/2020 1558   MCV 83.3 01/10/2022 1458   MCV 84 04/13/2020 1558   MCH 26.0 12/11/2021 1848   MCHC 31.6 01/10/2022 1458   RDW 13.5 01/10/2022 1458   RDW 12.7 04/13/2020 1558   LYMPHSABS 0.9 12/11/2021 1848   LYMPHSABS 2.0 12/30/2017 1147   MONOABS 0.8 12/11/2021 1848   EOSABS 0.2 12/11/2021 1848   EOSABS 0.2 12/30/2017 1147   BASOSABS 0.0 12/11/2021 1848  BASOSABS 0.0 12/30/2017 1147    Chest Imaging: 12/11/2021 CT chest: Lung parenchyma appears normal no evidence of PE Incidental 18 mm right adrenal nodule.  Pulmonary Functions Testing Results: No flowsheet data found.  FeNO:   Pathology:   Echocardiogram:   Heart Catheterization:     Assessment & Plan:     ICD-10-CM   1. History of RSV infection  Z86.19     2. Adrenal nodule (HCC)  E27.8     3. Chronic diastolic CHF (congestive heart failure) (HCC)  I50.32     4. Morbid obesity ([place])  E66.01       Discussion:  This is a 46 year old female, history of RSV infection, had respiratory failure, improved since her hospitalization.  Incidentally found adrenal nodule.  Also has history of chronic diastolic heart failure  and morbid obesity.  Plan: I think that she can continue her current inhalers. She does not seem to have any persistent asthma symptoms.  This can occur post URI. She plans to drop back to just as needed albuterol. Not sure that she needs any additional image follow-up. Encouraged her to continue to work on weight loss which I think is important for her health. She also needs follow-up for this adrenal nodule that she is, contact PCP about. She already has follow-up with cardiologist, continue current cardiac regimen.  We appreciate the referral.   Current Outpatient Medications:    albuterol (PROVENTIL) (2.5 MG/3ML) 0.083% nebulizer solution, Inhale 1 vial via nebulizer every 6 (six) hours as needed for wheezing or shortness of breath., Disp: 150 mL, Rfl: 0   albuterol (VENTOLIN HFA) 108 (90 Base) MCG/ACT inhaler, Inhale 2 puffs into the lungs every 2 (two) hours as needed for wheezing or shortness of breath. (max of 12 puffs per day), Disp: 8.5 g, Rfl: 0   budesonide-formoterol (SYMBICORT) 80-4.5 MCG/ACT inhaler, Inhale 2 puffs into the lungs 2 (two) times daily. Rinse mouth after each use, Disp: 10.2 g, Rfl: 0   carvedilol (COREG) 25 MG tablet, Take 1 tablet (25 mg total) by mouth 2 (two) times daily with a meal., Disp: 180 tablet, Rfl: 3   furosemide (LASIX) 20 MG tablet, Take 1 tablet (20 mg total) by mouth daily. (Patient taking differently: Take 20 mg by mouth daily as needed for fluid or edema.), Disp: 30 tablet, Rfl: 2   hydrALAZINE (APRESOLINE) 25 MG tablet, Take 2 tablets (50 mg total) by mouth 3 (three) times daily., Disp: 90 tablet, Rfl: 2   losartan (COZAAR) 100 MG tablet, Take 1 tablet (100 mg total) by mouth daily., Disp: 90 tablet, Rfl: 3   pantoprazole (PROTONIX) 40 MG tablet, TAKE 1 TABLET (40 MG TOTAL) BY MOUTH DAILY., Disp: 90 tablet, Rfl: 0   Pseudoephedrine-APAP-DM (DAYQUIL PO), Take 2 capsules by mouth daily as needed (cold symptoms)., Disp: , Rfl:    rosuvastatin  (CRESTOR) 10 MG tablet, TAKE 1 TABLET BY MOUTH ONCE DAILY, Disp: 90 tablet, Rfl: 3   Semaglutide,0.25 or 0.5MG /DOS, (OZEMPIC, 0.25 OR 0.5 MG/DOSE,) 2 MG/1.5ML SOPN, Inject 0.5 mg into the skin once a week., Disp: 1.5 mL, Rfl: 0   spironolactone (ALDACTONE) 50 MG tablet, Take 1 tablet (50 mg total) by mouth daily., Disp: 90 tablet, Rfl: 3   vitamin B-12 (CYANOCOBALAMIN) 1000 MCG tablet, Take 1 tablet (1,000 mcg total) by mouth daily., Disp: , Rfl:    Vitamin D, Ergocalciferol, (DRISDOL) 1.25 MG (50000 UNIT) CAPS capsule, Take 1 capsule by mouth every 7 days., Disp: 12 capsule, Rfl:  [facility] 01/12/2022 2:48 PM

## 2022-01-12 NOTE — Patient Instructions (Signed)
Thank you for visiting Dr. Staria Birkhead at [facility]. Today we recommend the following:  Return if symptoms worsen or fail to improve.    Please do your part to reduce the spread of COVID-19.  

## 2022-01-15 NOTE — Telephone Encounter (Signed)
Appointment made

## 2022-01-16 ENCOUNTER — Other Ambulatory Visit: Payer: Self-pay | Admitting: Cardiovascular Disease

## 2022-01-16 ENCOUNTER — Other Ambulatory Visit (HOSPITAL_COMMUNITY): Payer: Self-pay

## 2022-01-16 DIAGNOSIS — K219 Gastro-esophageal reflux disease without esophagitis: Secondary | ICD-10-CM

## 2022-01-16 MED ORDER — PANTOPRAZOLE SODIUM 40 MG PO TBEC
DELAYED_RELEASE_TABLET | Freq: Every day | ORAL | 3 refills | Status: DC
  Filled 2022-01-16: qty 90, 90d supply, fill #0
  Filled 2022-05-29: qty 90, 90d supply, fill #1
  Filled 2022-08-21: qty 90, 90d supply, fill #2
  Filled 2023-01-07: qty 90, 90d supply, fill #3

## 2022-01-16 NOTE — Chronic Care Management (AMB) (Signed)
°  Care Management   Note  01/16/2022 Name: Amber Bentley MRN: [id number] DOB: [birth year]  Amber Bentley is a 46 y.o. year old adult who is a primary care patient of Nche, Charlene Brooke, NP and is actively engaged with the care management team. I reached out to Amber Bentley by phone today to assist with re-scheduling an initial visit with the RN Case Manager  Follow up plan: Patient declines further follow up and engagement by the care management team. Appropriate care team members and provider have been notified via electronic communication. Pt has f/u with pcp and will discuss issues with provider at that time   Amber Bentley, [place]: [phone]

## 2022-01-17 ENCOUNTER — Other Ambulatory Visit (HOSPITAL_COMMUNITY): Payer: Self-pay

## 2022-01-17 ENCOUNTER — Encounter: Payer: Self-pay | Admitting: Nurse Practitioner

## 2022-01-17 ENCOUNTER — Telehealth (INDEPENDENT_AMBULATORY_CARE_PROVIDER_SITE_OTHER): Payer: 59 | Admitting: Nurse Practitioner

## 2022-01-17 VITALS — Ht 66.75 in | Wt 343.0 lb

## 2022-01-17 DIAGNOSIS — E278 Other specified disorders of adrenal gland: Secondary | ICD-10-CM

## 2022-01-17 DIAGNOSIS — R7303 Prediabetes: Secondary | ICD-10-CM | POA: Diagnosis not present

## 2022-01-17 MED ORDER — OZEMPIC (0.25 OR 0.5 MG/DOSE) 2 MG/1.5ML ~~LOC~~ SOPN
0.5000 mg | PEN_INJECTOR | SUBCUTANEOUS | 1 refills | Status: DC
  Filled 2022-01-17: qty 1.5, 28d supply, fill #0
  Filled 2022-02-20: qty 1.5, 28d supply, fill #1

## 2022-01-17 NOTE — Assessment & Plan Note (Signed)
Fluctuating BP control, normal electrolytes Ordered CT ABD with/without contrast Check plasma fractioned metanephrine, aldosterone and renin activity. Continue hydralazine, spironolactone, and losartan BP Readings from Last 3 Encounters:  01/12/22 (!) 142/86  12/21/21 124/80  12/13/21 (!) 148/68

## 2022-01-17 NOTE — Progress Notes (Signed)
Virtual Visit via Video Note  I connected withNAME@ on 01/17/22 at  1:30 PM EST by a video enabled telemedicine application and verified that I am speaking with the correct person using two identifiers.  Location: Patient:Home Provider: Office Participants: patient and provider  I discussed the limitations of evaluation and management by telemedicine and the availability of in person appointments. I also discussed with the patient that there may be a patient responsible charge related to this service. The patient expressed understanding and agreed to proceed.  [id number] management and discuss adrenal mass  History of Present Illness: Adrenal nodule (HCC) Fluctuating BP control, normal electrolytes Ordered CT ABD with/without contrast Check plasma fractioned metanephrine, aldosterone and renin activity. Continue hydralazine, spironolactone, and losartan BP Readings from Last 3 Encounters:  01/12/22 (!) 142/86  12/21/21 124/80  12/13/21 (!) 148/68    Morbid obesity (HCC) Reports acute ABD pain and nausea occurred she did not make diet modifications when she started ozempic 0.5mg  weekly. GI symptoms have resolve with avoiding high fat/carb/sugar foods. We checked CMP and lipase: normal.  Advised to resume oxempic injection at 0.25mg  week x 1week, then 0.5mg  weekly till next office visit. F/up in 2months    Observations/Objective: Physical Exam Vitals reviewed.  Cardiovascular:     Rate and Rhythm: Normal rate.     Pulses: Normal pulses.  Pulmonary:     Effort: Pulmonary effort is normal.  Neurological:     Mental Status: Dabria Gudiel is alert and oriented to person, place, and time.  Psychiatric:        Mood and Affect: Mood normal.        Behavior: Behavior normal.        Thought Content: Thought content normal.    Assessment and Plan: Shya was seen today for follow-up.  Diagnoses and all orders for this visit:  Adrenal nodule ([place]) -     CT ABDOMEN PELVIS  W [place]; Future -     Aldosterone + renin activity w/ ratio; Future -     Metanephrines, plasma; Future -     Cortisol; Future  Prediabetes -     Semaglutide,0.25 or 0.5MG /DOS, (OZEMPIC, 0.25 OR 0.5 MG/DOSE,) 2 MG/1.5ML SOPN; Inject 0.5 mg into the skin once a week.  Morbid obesity ([place]) -     Semaglutide,0.25 or 0.5MG /DOS, (OZEMPIC, 0.25 OR 0.5 MG/DOSE,) 2 MG/1.5ML SOPN; Inject 0.5 mg into the skin once a week.   Follow Up Instructions: See above   I discussed the assessment and treatment plan with the patient. The patient was provided an opportunity to ask questions and all were answered. The patient agreed with the plan and demonstrated an understanding of the instructions.   The patient was advised to call back or seek an in-person evaluation if the symptoms worsen or if the condition fails to improve as anticipated.  Wilfred Lacy, NP

## 2022-01-17 NOTE — Assessment & Plan Note (Signed)
Reports acute ABD pain and nausea occurred she did not make diet modifications when she started ozempic 0.5mg  weekly. GI symptoms have resolve with avoiding high fat/carb/sugar foods. We checked CMP and lipase: normal.  Advised to resume oxempic injection at 0.25mg  week x 1week, then 0.5mg  weekly till next office visit. F/up in 30months

## 2022-01-18 ENCOUNTER — Other Ambulatory Visit (HOSPITAL_COMMUNITY): Payer: Self-pay

## 2022-01-24 ENCOUNTER — Telehealth: Payer: 59

## 2022-01-29 DIAGNOSIS — I5032 Chronic diastolic (congestive) heart failure: Secondary | ICD-10-CM | POA: Diagnosis not present

## 2022-01-29 DIAGNOSIS — I5041 Acute combined systolic (congestive) and diastolic (congestive) heart failure: Secondary | ICD-10-CM | POA: Diagnosis not present

## 2022-01-29 DIAGNOSIS — I159 Secondary hypertension, unspecified: Secondary | ICD-10-CM | POA: Diagnosis not present

## 2022-01-30 ENCOUNTER — Telehealth: Payer: Self-pay | Admitting: Nurse Practitioner

## 2022-01-30 ENCOUNTER — Ambulatory Visit: Payer: 59 | Admitting: Nurse Practitioner

## 2022-01-30 NOTE — Telephone Encounter (Signed)
Pt called back about her appt today, she wasn't able to get out of work. She asked what the appt was for and I told her it was to follow up on Ozempic and she said she did that on a video visit that she had on 01/17/22 with [name], Did she still need the appt today or does she need to schedule a follow up for it? Or just plan to come back for her physical in June

## 2022-01-31 NOTE — Telephone Encounter (Signed)
Patient notified and verbalized understanding. Appointment scheduled. 

## 2022-01-31 NOTE — Telephone Encounter (Signed)
You had advised patient to stop Ozempic due to some complications she was having, but she stated she wanted to continue and the appointment was made to discuss. Do you want her to come in or wait until June appointment?

## 2022-02-12 DIAGNOSIS — I5041 Acute combined systolic (congestive) and diastolic (congestive) heart failure: Secondary | ICD-10-CM | POA: Diagnosis not present

## 2022-02-12 DIAGNOSIS — I159 Secondary hypertension, unspecified: Secondary | ICD-10-CM | POA: Diagnosis not present

## 2022-02-12 DIAGNOSIS — I5032 Chronic diastolic (congestive) heart failure: Secondary | ICD-10-CM | POA: Diagnosis not present

## 2022-02-21 ENCOUNTER — Other Ambulatory Visit (HOSPITAL_COMMUNITY): Payer: Self-pay

## 2022-02-26 DIAGNOSIS — I159 Secondary hypertension, unspecified: Secondary | ICD-10-CM | POA: Diagnosis not present

## 2022-02-26 DIAGNOSIS — I5041 Acute combined systolic (congestive) and diastolic (congestive) heart failure: Secondary | ICD-10-CM | POA: Diagnosis not present

## 2022-02-26 DIAGNOSIS — I5032 Chronic diastolic (congestive) heart failure: Secondary | ICD-10-CM | POA: Diagnosis not present

## 2022-03-12 ENCOUNTER — Other Ambulatory Visit (HOSPITAL_COMMUNITY): Payer: Self-pay

## 2022-03-26 ENCOUNTER — Other Ambulatory Visit (HOSPITAL_COMMUNITY): Payer: Self-pay

## 2022-03-26 ENCOUNTER — Encounter: Payer: Self-pay | Admitting: Nurse Practitioner

## 2022-03-26 ENCOUNTER — Ambulatory Visit: Payer: 59 | Admitting: Nurse Practitioner

## 2022-03-26 DIAGNOSIS — Z803 Family history of malignant neoplasm of breast: Secondary | ICD-10-CM | POA: Insufficient documentation

## 2022-03-26 DIAGNOSIS — Z1231 Encounter for screening mammogram for malignant neoplasm of breast: Secondary | ICD-10-CM | POA: Diagnosis not present

## 2022-03-26 DIAGNOSIS — R7303 Prediabetes: Secondary | ICD-10-CM | POA: Diagnosis not present

## 2022-03-26 DIAGNOSIS — Z6841 Body Mass Index (BMI) 40.0 and over, adult: Secondary | ICD-10-CM | POA: Diagnosis not present

## 2022-03-26 DIAGNOSIS — Z8051 Family history of malignant neoplasm of kidney: Secondary | ICD-10-CM | POA: Insufficient documentation

## 2022-03-26 DIAGNOSIS — I110 Hypertensive heart disease with heart failure: Secondary | ICD-10-CM | POA: Diagnosis not present

## 2022-03-26 DIAGNOSIS — I43 Cardiomyopathy in diseases classified elsewhere: Secondary | ICD-10-CM

## 2022-03-26 LAB — TSH: TSH: 1.22 u[IU]/mL (ref 0.35–5.50)

## 2022-03-26 LAB — HEMOGLOBIN A1C: Hgb A1c MFr Bld: 5.6 % (ref 4.6–6.5)

## 2022-03-26 MED ORDER — HYDRALAZINE HCL 25 MG PO TABS
25.0000 mg | ORAL_TABLET | Freq: Two times a day (BID) | ORAL | 1 refills | Status: DC
  Filled 2022-03-26: qty 180, 90d supply, fill #0
  Filled 2022-08-21: qty 180, 90d supply, fill #1

## 2022-03-26 MED ORDER — OZEMPIC (1 MG/DOSE) 4 MG/3ML ~~LOC~~ SOPN
1.0000 mg | PEN_INJECTOR | SUBCUTANEOUS | 1 refills | Status: DC
  Filled 2022-03-26: qty 3, 28d supply, fill #0
  Filled 2022-03-27: qty 9, 84d supply, fill #0

## 2022-03-26 NOTE — Assessment & Plan Note (Signed)
Lost 5Lbs in last 23months ?Reports decreased portion size, no adverse side effects. ?She still struggles with incorporating daily exercise. ?Wt Readings from Last 3 Encounters:  ?03/26/22 (!) 338 lb 3.2 oz (153.4 kg)  ?01/17/22 (!) 343 lb (155.6 kg)  ?01/12/22 (!) 343 lb 6.4 oz (155.8 kg)  ? ?Increased ozempic to 1mg  weekly. Consider switch to wegovy? ?Provided printed information on how to increased daily exercise. ?Repeat hgbA1c and BMP ?F/up in 19month ?

## 2022-03-26 NOTE — Progress Notes (Signed)
? ?             Established Patient Visit ? ?Patient: Amber Bentley   DOB: [birth year]   46 y.o. Adult  MRN: [id number] ?Visit Date: 03/26/2022 ? ?Subjective:  ?  ?Chief Complaint  ?Patient presents with  ? Acute Visit  ? Follow-up  ?  Follow up on ozempic. No concerns.  ? ?HPI ?Morbid obesity ([place]) ?Lost 5Lbs in last 61months ?Reports decreased portion size, no adverse side effects. ?She still struggles with incorporating daily exercise. ?Wt Readings from Last 3 Encounters:  ?03/26/22 (!) 338 lb 3.2 oz (153.4 kg)  ?01/17/22 (!) 343 lb (155.6 kg)  ?01/12/22 (!) 343 lb 6.4 oz (155.8 kg)  ? ?Increased ozempic to 1mg  weekly. Consider switch to wegovy? ?Provided printed information on how to increased daily exercise. ?Repeat hgbA1c and BMP ?F/up in 31month ? ?Hypertensive cardiomyopathy, 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[place]) ?Elevated BP today due to missed meds yesterday per Ms. Settle. She is asymptomatic. ?BP Readings from Last 3 Encounters:  ?03/26/22 (!) 160/92  ?01/12/22 (!) 142/86  ?12/21/21 124/80  ? ?Advised about the importance of med, CPAP and Diet compliance. ?Advised to an additional dose of hydralazine at nood if BP>140/80. ?She is to monitor BP daily. ?New rx sent ?  ?Reviewed medical, surgical, and social history today ? ?Medications: ?Outpatient Medications Prior to Visit  ?Medication Sig  ? albuterol (PROVENTIL) (2.5 MG/3ML) 0.083% nebulizer solution Inhale 1 vial via nebulizer every 6 (six) hours as needed for wheezing or shortness of breath.  ? albuterol (VENTOLIN HFA) 108 (90 Base) MCG/ACT inhaler Inhale 2 puffs into the lungs every 2 (two) hours as needed for wheezing or shortness of breath. (max of 12 puffs per day)  ? budesonide-formoterol (SYMBICORT) 80-4.5 MCG/ACT inhaler Inhale 2 puffs into the lungs 2 (two) times daily. Rinse mouth after each use  ? carvedilol (COREG) 25 MG tablet Take 1 tablet (25 mg total) by mouth 2 (two) times daily with a meal.  ? furosemide (LASIX) 20 MG tablet Take 1 tablet (20 mg  total) by mouth daily. (Patient taking differently: Take 20 mg by mouth daily as needed for fluid or edema.)  ? losartan (COZAAR) 100 MG tablet Take 1 tablet (100 mg total) by mouth daily.  ? pantoprazole (PROTONIX) 40 MG tablet TAKE 1 TABLET (40 MG TOTAL) BY MOUTH DAILY.  ? rosuvastatin (CRESTOR) 10 MG tablet TAKE 1 TABLET BY MOUTH ONCE DAILY  ? spironolactone (ALDACTONE) 50 MG tablet Take 1 tablet (50 mg total) by mouth daily.  ? vitamin B-12 (CYANOCOBALAMIN) 1000 MCG tablet Take 1 tablet (1,000 mcg total) by mouth daily.  ? Vitamin D, Ergocalciferol, (DRISDOL) 1.25 MG (50000 UNIT) CAPS capsule Take 1 capsule by mouth every 7 days.  ? [DISCONTINUED] hydrALAZINE (APRESOLINE) 25 MG tablet Take 2 tablets (50 mg total) by mouth 3 (three) times daily.  ? [DISCONTINUED] Semaglutide,0.25 or 0.5MG /DOS, (OZEMPIC, 0.25 OR 0.5 MG/DOSE,) 2 MG/1.5ML SOPN Inject 0.5 mg into the skin once a week.  ? ?No facility-administered medications prior to visit.  ? ?Reviewed past medical and social history.  ? ?ROS per HPI above ? ? ?   ?Objective:  ?BP (!) 160/92 (BP Location: Left Arm, Patient Position: Sitting, Cuff Size: Large)   Pulse 72   Temp 97.6 ?F (36.4 ?C) (Temporal)   Ht 5\' 6"  (1.676 m)   Wt (!) 338 lb 3.2 oz (153.4 kg)   SpO2 97%   BMI 54.59 kg/m?  ? ?  ? ?  Physical Exam ?Vitals reviewed.  ?Cardiovascular:  ?   Rate and Rhythm: Normal rate.  ?   Pulses: Normal pulses.  ?Pulmonary:  ?   Effort: Pulmonary effort is normal.  ?Neurological:  ?   Mental Status: Amber Bentley is alert and oriented to person, place, and time.  ?  ?No results found for any visits on 03/26/22. ?   ?Assessment & Plan:  ?  ?Problem List Items Addressed This Visit   ? ?  ? Cardiovascular and Mediastinum  ? Hypertensive cardiomyopathy, 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[place])  ?  Elevated BP today due to missed meds yesterday per Ms. Hoffart. She is asymptomatic. ?BP Readings from Last 3 Encounters:  ?03/26/22 (!) 160/92  ?01/12/22 (!) 142/86  ?12/21/21 124/80   ? ?Advised about the importance of med, CPAP and Diet compliance. ?Advised to an additional dose of hydralazine at nood if BP>140/80. ?She is to monitor BP daily. ?New rx sent ?  ?  ? Relevant Medications  ? hydrALAZINE (APRESOLINE) 25 MG tablet  ?  ? Other  ? Morbid obesity ([place]) - Primary  ?  Lost 5Lbs in last 2months ?Reports decreased portion size, no adverse side effects. ?She still struggles with incorporating daily exercise. ?Wt Readings from Last 3 Encounters:  ?03/26/22 (!) 338 lb 3.2 oz (153.4 kg)  ?01/17/22 (!) 343 lb (155.6 kg)  ?01/12/22 (!) 343 lb 6.4 oz (155.8 kg)  ? ?Increased ozempic to 1mg  weekly. Consider switch to wegovy? ?Provided printed information on how to increased daily exercise. ?Repeat hgbA1c and BMP ?F/up in 18month ?  ?  ? Relevant Medications  ? Semaglutide, 1 MG/DOSE, (OZEMPIC, 1 MG/DOSE,) 4 MG/3ML SOPN  ? Other Relevant Orders  ? TSH  ? Prediabetes  ? Relevant Medications  ? Semaglutide, 1 MG/DOSE, (OZEMPIC, 1 MG/DOSE,) 4 MG/3ML SOPN  ? Other Relevant Orders  ? Hemoglobin A1c  ? ?Other Visit Diagnoses   ? ? Breast cancer screening by mammogram      ? Relevant Orders  ? MM 3D SCREEN BREAST BILATERAL  ? BMI 50.0-59.9, adult ([place])      ? Relevant Medications  ? Semaglutide, 1 MG/DOSE, (OZEMPIC, 1 MG/DOSE,) 4 MG/3ML SOPN  ? ?  ? ?Return in about 4 weeks (around 04/23/2022) for weight management. ? ?  ? ?Wilfred Lacy, NP ? ? ?

## 2022-03-26 NOTE — Patient Instructions (Addendum)
Increase ozempic to 1mg  weekly ?Start daily exercise (brick walking 20-77mins and strength training 10-60mins). ?Maintain DASH diet and small meal portions. ?Go to lab for blood draw. ?Monitor BP at home once a day. Take extra hydralazine at noon if BP >140/80. ? ?How to Increase Your Level of Physical Activity ?Getting regular physical activity is important for your overall health and well-being. Most people do not get enough exercise. There are easy ways to increase your level of physical activity, even if you have not been very active in the past or if you are just starting out. ?What are the benefits of physical activity? ?Physical activity has many short-term and long-term benefits. Being active on a regular basis can improve your physical and mental health as well as provide other benefits. ?Physical health benefits ?Helping you lose weight or maintain a healthy weight. ?Strengthening your muscles and bones. ?Reducing your risk of certain long-term (chronic) diseases, including heart disease, cancer, and diabetes. ?Being able to move around more easily and for longer periods of time without getting tired (increased endurance or stamina). ?Improving your ability to fight off illness (enhanced immunity). ?Being able to sleep better. ?Helping you stay healthy as you get older, including: ?Helping you stay mobile, or capable of walking and moving around. ?Preventing accidents, such as falls. ?Increasing life expectancy. ?Mental health benefits ?Boosting your mood and improving your self-esteem. ?Lowering your chance of having mental health problems, such as depression or anxiety. ?Helping you feel good about your body. ?Other benefits ?Finding new sources of fun and enjoyment. ?Meeting new people who share a common interest. ?Before you begin ?If you have a chronic illness or have not been active for a while, check with your health care provider about how to get started. Ask your health care provider what  activities are safe for you. ?Start out slowly. Walking or doing some simple chair exercises is a good place to start, especially if you have not been active before or for a long time. ?Set goals that you can work toward. Ask your health care provider how much exercise is best for you. In general, most adults should: ?Do moderate-intensity exercise for at least 150 minutes each week (30 minutes on most days of the week) or vigorous exercise for at least 75 minutes each week, or a combination of these. ?Moderate-intensity exercise can include walking at a quick pace, biking, yoga, water aerobics, or gardening. ?Vigorous exercise involves activities that take more effort, such as jogging or running, playing sports, swimming laps, or jumping rope. ?Do strength exercises on at least 2 days each week. This can include weight lifting, body weight exercises, and resistance-band exercises. ?How to be more physically active ?Make a plan ? ?Try to find activities that you enjoy. You are more likely to commit to an exercise routine if it does not feel like a chore. ?If you have bone or joint problems, choose low-impact exercises, like walking or swimming. ?Use these tips for being successful with an exercise plan: ?Find a workout partner for accountability. ?Join a group or class, such as an aerobics class, cycling class, or sports team. ?Make family time active. Go for a walk, bike, or swim. ?Include a variety of exercises each week. ?Consider using a fitness tracker, such as a mobile phone app or a device worn like a watch, that will count the number of steps you take each day. Many people strive to reach 10,000 steps a day. ?Find ways to be active in your daily  routines ?Besides your formal exercise plans, you can find ways to do physical activity during your daily routines, such as: ?Walking or biking to work or to the store. ?Taking the stairs instead of the elevator. ?Parking farther away from the door at work or at the  store. ?Planning walking meetings. ?Walking around while you are on the phone. ?Where to find more information ?Centers for Disease Control and Prevention: CampusCasting.com.pt ?President's [employer] on [employer], Sports & Nutrition: www.fitness.gov ?ChooseMyPlate: http://www.harvey.com/ ?Contact a health care provider if: ?You have headaches, muscle aches, or joint pain that is concerning. ?You feel dizzy or light-headed while exercising. ?You faint. ?You feel your heart skipping, racing, or fluttering. ?You have chest pain while exercising. ?Summary ?Exercise benefits your mind and body at any age, even if you are just starting out. ?If you have a chronic illness or have not been active for a while, check with your health care provider before increasing your physical activity. ?Choose activities that are safe and enjoyable for you. Ask your health care provider what activities are safe for you. ?Start slowly. Tell your health care provider if you have problems as you start to increase your activity level. ?This information is not intended to replace advice given to you by your health care provider. Make sure you discuss any questions you have with your health care provider. ?Document Revised: 03/31/2021 Document Reviewed: 03/31/2021 ?Elsevier Patient Education ? 2022 Else[employer] Inc[employer] ? ?

## 2022-03-26 NOTE — Assessment & Plan Note (Addendum)
Elevated BP today due to missed meds yesterday per Amber Bentley. She is asymptomatic. ?BP Readings from Last 3 Encounters:  ?03/26/22 (!) 160/92  ?01/12/22 (!) 142/86  ?12/21/21 124/80  ? ?Advised about the importance of med, CPAP and Diet compliance. ?Advised to an additional dose of hydralazine at nood if BP>140/80. ?She is to monitor BP daily. ?New rx sent ?

## 2022-03-27 ENCOUNTER — Encounter: Payer: Self-pay | Admitting: Nurse Practitioner

## 2022-03-27 ENCOUNTER — Other Ambulatory Visit (HOSPITAL_COMMUNITY): Payer: Self-pay

## 2022-03-29 DIAGNOSIS — I159 Secondary hypertension, unspecified: Secondary | ICD-10-CM | POA: Diagnosis not present

## 2022-03-29 DIAGNOSIS — I5041 Acute combined systolic (congestive) and diastolic (congestive) heart failure: Secondary | ICD-10-CM | POA: Diagnosis not present

## 2022-03-29 DIAGNOSIS — I5032 Chronic diastolic (congestive) heart failure: Secondary | ICD-10-CM | POA: Diagnosis not present

## 2022-04-03 ENCOUNTER — Other Ambulatory Visit (HOSPITAL_COMMUNITY): Payer: Self-pay

## 2022-04-03 NOTE — Telephone Encounter (Signed)
PA for Ozempic 1mg /48ml & 3mg    approved from 04/02/22 - 09/15/22.  Patient notified through my chart message.  Dm/cma ? ?

## 2022-04-04 ENCOUNTER — Other Ambulatory Visit (INDEPENDENT_AMBULATORY_CARE_PROVIDER_SITE_OTHER): Payer: 59

## 2022-04-04 DIAGNOSIS — E278 Other specified disorders of adrenal gland: Secondary | ICD-10-CM | POA: Diagnosis not present

## 2022-04-04 LAB — CORTISOL: Cortisol, Plasma: 16.1 ug/dL

## 2022-04-15 LAB — EXTRA SPECIMEN

## 2022-04-15 LAB — ALDOSTERONE + RENIN ACTIVITY W/ RATIO
ALDO / PRA Ratio: 5.1 Ratio (ref 0.9–28.9)
Aldosterone: 15 ng/dL
Renin Activity: 2.96 ng/mL/h (ref 0.25–5.82)

## 2022-04-15 LAB — METANEPHRINES, PLASMA
Metanephrine, Free: 28 pg/mL (ref <=57)
Normetanephrine, Free: 72 pg/mL (ref <=148)
Total Metanephrines-Plasma: 100 pg/mL (ref <=205)

## 2022-04-16 ENCOUNTER — Other Ambulatory Visit (HOSPITAL_COMMUNITY): Payer: Self-pay

## 2022-04-20 DIAGNOSIS — I5032 Chronic diastolic (congestive) heart failure: Secondary | ICD-10-CM | POA: Diagnosis not present

## 2022-04-20 DIAGNOSIS — I159 Secondary hypertension, unspecified: Secondary | ICD-10-CM | POA: Diagnosis not present

## 2022-04-20 DIAGNOSIS — I5041 Acute combined systolic (congestive) and diastolic (congestive) heart failure: Secondary | ICD-10-CM | POA: Diagnosis not present

## 2022-04-28 DIAGNOSIS — I5032 Chronic diastolic (congestive) heart failure: Secondary | ICD-10-CM | POA: Diagnosis not present

## 2022-04-28 DIAGNOSIS — I159 Secondary hypertension, unspecified: Secondary | ICD-10-CM | POA: Diagnosis not present

## 2022-04-28 DIAGNOSIS — I5041 Acute combined systolic (congestive) and diastolic (congestive) heart failure: Secondary | ICD-10-CM | POA: Diagnosis not present

## 2022-05-29 ENCOUNTER — Other Ambulatory Visit (HOSPITAL_COMMUNITY): Payer: Self-pay

## 2022-05-29 DIAGNOSIS — I5032 Chronic diastolic (congestive) heart failure: Secondary | ICD-10-CM | POA: Diagnosis not present

## 2022-05-29 DIAGNOSIS — I159 Secondary hypertension, unspecified: Secondary | ICD-10-CM | POA: Diagnosis not present

## 2022-05-29 DIAGNOSIS — I5041 Acute combined systolic (congestive) and diastolic (congestive) heart failure: Secondary | ICD-10-CM | POA: Diagnosis not present

## 2022-05-31 ENCOUNTER — Other Ambulatory Visit (HOSPITAL_COMMUNITY): Payer: Self-pay

## 2022-05-31 ENCOUNTER — Ambulatory Visit (INDEPENDENT_AMBULATORY_CARE_PROVIDER_SITE_OTHER): Payer: 59

## 2022-05-31 ENCOUNTER — Encounter: Payer: Self-pay | Admitting: Nurse Practitioner

## 2022-05-31 ENCOUNTER — Ambulatory Visit (INDEPENDENT_AMBULATORY_CARE_PROVIDER_SITE_OTHER): Payer: 59 | Admitting: Nurse Practitioner

## 2022-05-31 VITALS — BP 128/78 | HR 68 | Temp 97.2°F | Ht 66.0 in | Wt 326.6 lb

## 2022-05-31 DIAGNOSIS — Z111 Encounter for screening for respiratory tuberculosis: Secondary | ICD-10-CM

## 2022-05-31 DIAGNOSIS — F339 Major depressive disorder, recurrent, unspecified: Secondary | ICD-10-CM | POA: Insufficient documentation

## 2022-05-31 DIAGNOSIS — F3342 Major depressive disorder, recurrent, in full remission: Secondary | ICD-10-CM

## 2022-05-31 DIAGNOSIS — Z6841 Body Mass Index (BMI) 40.0 and over, adult: Secondary | ICD-10-CM | POA: Diagnosis not present

## 2022-05-31 DIAGNOSIS — Z0001 Encounter for general adult medical examination with abnormal findings: Secondary | ICD-10-CM

## 2022-05-31 DIAGNOSIS — R7303 Prediabetes: Secondary | ICD-10-CM

## 2022-05-31 MED ORDER — OZEMPIC (1 MG/DOSE) 4 MG/3ML ~~LOC~~ SOPN
2.0000 mg | PEN_INJECTOR | SUBCUTANEOUS | 1 refills | Status: DC
  Filled 2022-05-31: qty 15, 70d supply, fill #0

## 2022-05-31 MED ORDER — OZEMPIC (2 MG/DOSE) 8 MG/3ML ~~LOC~~ SOPN
2.0000 mg | PEN_INJECTOR | SUBCUTANEOUS | 1 refills | Status: DC
  Filled 2022-05-31: qty 9, 84d supply, fill #0
  Filled 2022-08-21: qty 3, 28d supply, fill #1
  Filled 2022-10-29: qty 3, 28d supply, fill #2
  Filled 2022-12-23: qty 3, 28d supply, fill #3

## 2022-05-31 NOTE — Patient Instructions (Addendum)
Schedule appt for mammogram and CT ABD  Resume calcium and vit. D: 600-800mg  and 2000IU daily.  Increase ozempic dose to 2mg  weekly  Go to lab for CXR. Form is complete after adding CXR results.  How to Increase Your Level of Physical Activity Getting regular physical activity is important for your overall health and well-being. Most people do not get enough exercise. There are easy ways to increase your level of physical activity, even if you have not been very active in the past or if you are just starting out. What are the benefits of physical activity? Physical activity has many short-term and long-term benefits. Being active on a regular basis can improve your physical and mental health as well as provide other benefits. Physical health benefits Helping you lose weight or maintain a healthy weight. Strengthening your muscles and bones. Reducing your risk of certain long-term (chronic) diseases, including heart disease, cancer, and diabetes. Being able to move around more easily and for longer periods of time without getting tired (increased endurance or stamina). Improving your ability to fight off illness (enhanced immunity). Being able to sleep better. Helping you stay healthy as you get older, including: Helping you stay mobile, or capable of walking and moving around. Preventing accidents, such as falls. Increasing life expectancy. Mental health benefits Boosting your mood and improving your self-esteem. Lowering your chance of having mental health problems, such as depression or anxiety. Helping you feel good about your body. Other benefits Finding new sources of fun and enjoyment. Meeting new people who share a common interest. Before you begin If you have a chronic illness or have not been active for a while, check with your health care provider about how to get started. Ask your health care provider what activities are safe for you. Start out slowly. Walking or doing  some simple chair exercises is a good place to start, especially if you have not been active before or for a long time. Set goals that you can work toward. Ask your health care provider how much exercise is best for you. In general, most adults should: Do moderate-intensity exercise for at least 150 minutes each week (30 minutes on most days of the week) or vigorous exercise for at least 75 minutes each week, or a combination of these. Moderate-intensity exercise can include walking at a quick pace, biking, yoga, water aerobics, or gardening. Vigorous exercise involves activities that take more effort, such as jogging or running, playing sports, swimming laps, or jumping rope. Do strength exercises on at least 2 days each week. This can include weight lifting, body weight exercises, and resistance-band exercises. How to be more physically active Make a plan  Try to find activities that you enjoy. You are more likely to commit to an exercise routine if it does not feel like a chore. If you have bone or joint problems, choose low-impact exercises, like walking or swimming. Use these tips for being successful with an exercise plan: Find a workout partner for accountability. Join a group or class, such as an aerobics class, cycling class, or sports team. Make family time active. Go for a walk, bike, or swim. Include a variety of exercises each week. Consider using a fitness tracker, such as a mobile phone app or a device worn like a watch, that will count the number of steps you take each day. Many people strive to reach 10,000 steps a day. Find ways to be active in your daily routines Besides your formal exercise plans,  you can find ways to do physical activity during your daily routines, such as: Walking or biking to work or to the store. Taking the stairs instead of the elevator. Parking farther away from the door at work or at the store. Planning walking meetings. Walking around while you are  on the phone. Where to find more information Centers for Disease Control and Prevention: CampusCasting.com.pt President's Council on Fitness, Sports & Nutrition: www.fitness.gov ChooseMyPlate: http://www.harvey.com/ Contact a health care provider if: You have headaches, muscle aches, or joint pain that is concerning. You feel dizzy or light-headed while exercising. You faint. You feel your heart skipping, racing, or fluttering. You have chest pain while exercising. Summary Exercise benefits your mind and body at any age, even if you are just starting out. If you have a chronic illness or have not been active for a while, check with your health care provider before increasing your physical activity. Choose activities that are safe and enjoyable for you. Ask your health care provider what activities are safe for you. Start slowly. Tell your health care provider if you have problems as you start to increase your activity level. This information is not intended to replace advice given to you by your health care provider. Make sure you discuss any questions you have with your health care provider. Document Revised: 03/31/2021 Document Reviewed: 03/31/2021 Elsevier Patient Education  2023 [employer].

## 2022-05-31 NOTE — Progress Notes (Signed)
Complete physical exam  Patient: Amber Bentley   DOB: 03/22/1976   46 y.o. 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 adult who presents today for a complete physical exam. Amber Bentley reports consuming a low fat diet.  Walking at work and [employer] training daily  Amber Bentley generally feels well. Amber Bentley reports sleeping well. Amber Bentley does have additional problems to discuss today.  Vision:Yes Dental:No STD Screen:No  Most recent fall risk assessment:    03/26/2022    1:46 PM  Fall Risk   Falls in the past year? 0  Number falls in past yr: 0  Injury with Fall? 0   Most recent depression screenings:    05/31/2022    3:04 PM 03/26/2022    1:46 PM  PHQ 2/9 Scores  PHQ - 2 Score 0 0  PHQ- 9 Score 3    HPI  Morbid obesity (HCC) Her weight loss goal: 285lbs Lost 12lbs in last 82months Lost total of 17lbs in last 61months Current use of Ozempic 1mg  weekly, no adverse side effects. Wt Readings from Last 3 Encounters:  05/31/22 (!) 326 lb 9.6 oz (148.1 kg)  03/26/22 (!) 338 lb 3.2 oz (153.4 kg)  01/17/22 (!) 343 lb (155.6 kg)   We discussed gastric surgery to facilitate weight loss. She opted to wait for now. She will inquire from insurance if it is covered. Increase ozempic dose to 2mg  weekly Advised to add weight training exercise 3x/week. Use home videos to guide. F/up in 56months   Past Medical History:  Diagnosis Date   Acute combined systolic and diastolic congestive heart failure (HCC) 02/21/2017   Acute respiratory failure with hypoxia (HCC) 12/11/2021   Anemia    Anxiety    Cardiomegaly    Chest pain    CHF (congestive heart failure) (HCC)    Congestive heart failure (HCC)    Constipation    Depression    Dyspnea    Fatigue    Food  allergy    celery   GERD (gastroesophageal reflux disease)    Hip pain    HLD (hyperlipidemia)    HTN (hypertension)    Infertility, female    Lactose intolerance    Leg edema    Lower back pain    OSA on CPAP    Palpitations    Prediabetes    Shoulder pain    SOB (shortness of breath)    Stomach ulcer    Varicose vein of leg    Wheezing 12/11/2021   Past Surgical History:  Procedure Laterality Date   cervix removal N/A    ENDOMETRIAL ABLATION  2007   prior to hysterectomy   TOTAL ABDOMINAL HYSTERECTOMY     Social History   Socioeconomic History   Marital status: Married    Spouse name: 12/13/2021   Number of children: 2   Years of education: MA   Highest education level: Not on file  Occupational History   Occupation: [employer]  Tobacco Use   Smoking status: Former    Packs/day: 0.25    Years: 10.00    Total pack years: 2.50    Types: Cigarettes    Quit date: 12/12/2021    Years since quitting: 0.4  Smokeless tobacco: Never  Vaping Use   Vaping Use: Never used  Substance and Sexual Activity   Alcohol use: Yes    Comment: rarely   Drug use: No   Sexual activity: Yes    Birth control/protection: Other-see comments    Comment: female partner  Other Topics Concern   Not on file  Social History Narrative   Drinks coffee daily    Social Determinants of Health   Financial Resource Strain: Not on file  Food Insecurity: Not on file  Transportation Needs: Not on file  Physical Activity: Not on file  Stress: Not on file  Social Connections: Not on file  Intimate Partner Violence: Not on file   Family Status  Relation Name Status   Mother  Alive   Father  Deceased   Sister  Alive   Brother  Deceased   Mat Aunt  Deceased   MGM  Deceased   MGF  Deceased   PGM  Deceased   PGF  Deceased   Family History  Problem Relation Age of Onset   Hypertension Mother    Cancer Mother        breast cancer   Kidney disease Mother    Depression  Mother    Obesity Mother    Breast cancer Mother    Diabetes Father    Heart disease Father 10   Hyperlipidemia Father    Hypertension Father    Depression Father    Anxiety disorder Father    Sleep apnea Father    Obesity Father    Cancer Sister        kidney cancer   Thyroid disease Sister    Sudden death Brother    Cancer Maternal Aunt        breast cancer   Breast cancer Maternal Aunt        in 77's   Cancer Maternal Grandmother        breast   Breast cancer Maternal Grandmother    Breast cancer Paternal Grandmother    Cancer Paternal Grandfather        brain cancer   Allergies  Allergen Reactions   Food Anaphylaxis and Other (See Comments)    Pt is allergic to celery.    Aleve [Naproxen] Other (See Comments)    Pt states that this medication makes her loopy.     Patient Care Team: Kalyan Barabas, Bonna Gains, NP as PCP - General (Internal Medicine) Nahser, Deloris Ping, MD as PCP - Cardiology (Cardiology)   Medications: Outpatient Medications Prior to Visit  Medication Sig   albuterol (PROVENTIL) (2.5 MG/3ML) 0.083% nebulizer solution Inhale 1 vial via nebulizer every 6 (six) hours as needed for wheezing or shortness of breath.   albuterol (VENTOLIN HFA) 108 (90 Base) MCG/ACT inhaler Inhale 2 puffs into the lungs every 2 (two) hours as needed for wheezing or shortness of breath. (max of 12 puffs per day)   budesonide-formoterol (SYMBICORT) 80-4.5 MCG/ACT inhaler Inhale 2 puffs into the lungs 2 (two) times daily. Rinse mouth after each use   carvedilol (COREG) 25 MG tablet Take 1 tablet (25 mg total) by mouth 2 (two) times daily with a meal.   furosemide (LASIX) 20 MG tablet Take 1 tablet (20 mg total) by mouth daily. (Patient taking differently: Take 20 mg by mouth daily as needed for fluid or edema.)   hydrALAZINE (APRESOLINE) 25 MG tablet Take 1 tablet  by mouth 2  times daily. Take 1 tablet at midday if BP>140/80  losartan (COZAAR) 100 MG tablet Take 1 tablet (100 mg total)  by mouth daily.   pantoprazole (PROTONIX) 40 MG tablet TAKE 1 TABLET (40 MG TOTAL) BY MOUTH DAILY.   rosuvastatin (CRESTOR) 10 MG tablet TAKE 1 TABLET BY MOUTH ONCE DAILY   spironolactone (ALDACTONE) 50 MG tablet Take 1 tablet (50 mg total) by mouth daily.   [DISCONTINUED] Semaglutide, 1 MG/DOSE, (OZEMPIC, 1 MG/DOSE,) 4 MG/3ML SOPN Inject 1 mg into the skin once a week.   [DISCONTINUED] vitamin B-12 (CYANOCOBALAMIN) 1000 MCG tablet Take 1 tablet (1,000 mcg total) by mouth daily. (Patient not taking: Reported on 05/31/2022)   [DISCONTINUED] Vitamin D, Ergocalciferol, (DRISDOL) 1.25 MG (50000 UNIT) CAPS capsule Take 1 capsule by mouth every 7 days. (Patient not taking: Reported on 05/31/2022)   No facility-administered medications prior to visit.   Review of Systems  Constitutional:  Negative for fever.  HENT:  Negative for congestion and sore throat.   Eyes:        Negative for visual changes  Respiratory:  Negative for cough and shortness of breath.   Cardiovascular:  Negative for chest pain, palpitations and leg swelling.  Gastrointestinal:  Negative for blood in stool, constipation and diarrhea.  Genitourinary:  Negative for dysuria, frequency and urgency.  Musculoskeletal:  Negative for myalgias.  Skin:  Negative for rash.  Neurological:  Negative for dizziness and headaches.  Hematological:  Does not bruise/bleed easily.  Psychiatric/Behavioral:  Negative for suicidal ideas. The patient is not nervous/anxious.    Last CBC Lab Results  Component Value Date   WBC 9.9 01/10/2022   HGB 13.6 01/10/2022   HCT 43.1 01/10/2022   MCV 83.3 01/10/2022   MCH 26.0 12/11/2021   RDW 13.5 01/10/2022   PLT 203.0 01/10/2022   Last metabolic panel Lab Results  Component Value Date   GLUCOSE 91 01/10/2022   NA 140 01/10/2022   K 4.3 01/10/2022   CL 102 01/10/2022   CO2 32 01/10/2022   BUN 11 01/10/2022   CREATININE 1.05 01/10/2022   GFRNONAA >60 12/11/2021   CALCIUM 9.1 01/10/2022    PROT 7.2 01/10/2022   ALBUMIN 4.2 01/10/2022   LABGLOB 2.4 10/27/2019   AGRATIO 1.8 10/27/2019   BILITOT 0.4 01/10/2022   ALKPHOS 58 01/10/2022   AST 21 01/10/2022   ALT 27 01/10/2022   ANIONGAP 8 12/11/2021   Last lipids Lab Results  Component Value Date   CHOL 167 10/27/2019   HDL 51 10/27/2019   LDLCALC 104 (H) 10/27/2019   TRIG 64 10/27/2019   CHOLHDL 3.3 04/08/2017   Last hemoglobin A1c Lab Results  Component Value Date   HGBA1C 5.6 03/26/2022   Last thyroid functions Lab Results  Component Value Date   TSH 1.22 03/26/2022   T3TOTAL 168 12/30/2017   Last vitamin D Lab Results  Component Value Date   VD25OH 17.85 (L) 11/30/2021   Last vitamin B12 and Folate Lab Results  Component Value Date   VITAMINB12 370 04/14/2019   FOLATE 7.0 12/30/2017        Objective:  BP 128/78 (BP Location: Right Arm, Patient Position: Sitting, Cuff Size: Normal)   Pulse 68   Temp (!) 97.2 F (36.2 C) (Temporal)   Ht 5\' 6"  (1.676 m)   Wt (!) 326 lb 9.6 oz (148.1 kg)   SpO2 93%   BMI 52.71 kg/m     BP Readings from Last 3 Encounters:  05/31/22 128/78  03/26/22 (!) 160/92  01/12/22 (!) 142/86  Wt Readings from Last 3 Encounters:  05/31/22 (!) 326 lb 9.6 oz (148.1 kg)  03/26/22 (!) 338 lb 3.2 oz (153.4 kg)  01/17/22 (!) 343 lb (155.6 kg)   Physical Exam Vitals reviewed.  Constitutional:      General: Taylin Mans is not in acute distress.    Appearance: Justyce Yeater is obese.  HENT:     Right Ear: Tympanic membrane, ear canal and external ear normal.     Left Ear: Tympanic membrane, ear canal and external ear normal.     Nose: Nose normal.  Eyes:     General: No scleral icterus.    Extraocular Movements: Extraocular movements intact.     Conjunctiva/sclera: Conjunctivae normal.  Cardiovascular:     Rate and Rhythm: Normal rate and regular rhythm.     Pulses: Normal pulses.     Heart sounds: Normal heart sounds.  Pulmonary:     Effort:  Pulmonary effort is normal. No respiratory distress.     Breath sounds: Normal breath sounds.  Chest:     Comments: Declined breast exam. She plans to schedule appt for mammogram. Abdominal:     General: Bowel sounds are normal. There is no distension.     Palpations: Abdomen is soft.  Musculoskeletal:        General: Normal range of motion.     Cervical back: Normal range of motion and neck supple.     Right lower leg: No edema.     Left lower leg: No edema.  Lymphadenopathy:     Cervical: No cervical adenopathy.  Skin:    General: Skin is warm and dry.  Neurological:     Mental Status: Catrice Zuleta is alert and oriented to person, place, and time.  Psychiatric:        Behavior: Behavior normal.      No results found for any visits on 05/31/22.    Assessment & Plan:    Routine Health Maintenance and Physical Exam  Immunization History  Administered Date(s) Administered   Influenza,inj,Quad PF,6+ Mos 11/21/2017, 09/16/2018, 09/24/2019, 11/16/2021   PFIZER(Purple Top)SARS-COV-2 Vaccination 02/13/2020, 03/09/2020   Pfizer Covid-19 Vaccine Bivalent Booster 24yrs & up 09/03/2021    Health Maintenance  Topic Date Due   Hepatitis C Screening  06/01/2023 (Originally 10/24/1994)   INFLUENZA VACCINE  07/17/2022   TETANUS/TDAP  09/16/2024   COVID-19 Vaccine  Completed   HIV Screening  Completed   HPV VACCINES  Aged Out   PAP SMEAR-Modifier  Discontinued   Discussed health benefits of physical activity, and encouraged Caoimhe Damron to engage in regular exercise appropriate for Chiffon Kittleson Sheen's age and condition. Schedule appt for mammogram and CT ABD Resume calcium and vit. D: 600-800mg  and 2000IU daily. Increase ozempic dose to 2mg  weekly Go to lab for CXR. Form is complete after adding CXR results.  Problem List Items Addressed This Visit       Other   Depression, recurrent (HCC)   Morbid obesity (HCC)    Her weight loss goal: 285lbs Lost 12lbs in  last 31months Lost total of 17lbs in last 31months Current use of Ozempic 1mg  weekly, no adverse side effects. Wt Readings from Last 3 Encounters:  05/31/22 (!) 326 lb 9.6 oz (148.1 kg)  03/26/22 (!) 338 lb 3.2 oz (153.4 kg)  01/17/22 (!) 343 lb (155.6 kg)   We discussed gastric surgery to facilitate weight loss. She opted to wait for now. She will inquire from insurance if it  is covered. Increase ozempic dose to 2mg  weekly Advised to add weight training exercise 3x/week. Use home videos to guide. F/up in 18months      Relevant Medications   Semaglutide, 2 MG/DOSE, (OZEMPIC, 2 MG/DOSE,) 8 MG/3ML SOPN   Prediabetes   Relevant Medications   Semaglutide, 2 MG/DOSE, (OZEMPIC, 2 MG/DOSE,) 8 MG/3ML SOPN   Other Visit Diagnoses     Encounter for preventative adult health care exam with abnormal findings    -  Primary   BMI 50.0-59.9, adult (HCC)       Relevant Medications   Semaglutide, 2 MG/DOSE, (OZEMPIC, 2 MG/DOSE,) 8 MG/3ML SOPN   Screening-pulmonary TB       Relevant Orders   DG Chest 2 View      Return in about 3 months (around 08/31/2022) for DM and HTN, hyperlipidemia (fasting).     09/02/2022, NP

## 2022-05-31 NOTE — Assessment & Plan Note (Addendum)
Her weight loss goal: 285lbs Lost 12lbs in last 74months Lost total of 17lbs in last 28months Current use of Ozempic 1mg  weekly, no adverse side effects. Wt Readings from Last 3 Encounters:  05/31/22 (!) 326 lb 9.6 oz (148.1 kg)  03/26/22 (!) 338 lb 3.2 oz (153.4 kg)  01/17/22 (!) 343 lb (155.6 kg)   We discussed gastric surgery to facilitate weight loss. She opted to wait for now. She will inquire from insurance if it is covered. Increase ozempic dose to 2mg  weekly Advised to add weight training exercise 3x/week. Use home videos to guide. F/up in 13months

## 2022-06-01 ENCOUNTER — Other Ambulatory Visit (HOSPITAL_COMMUNITY): Payer: Self-pay

## 2022-06-07 DIAGNOSIS — I517 Cardiomegaly: Secondary | ICD-10-CM | POA: Diagnosis not present

## 2022-06-07 DIAGNOSIS — Z111 Encounter for screening for respiratory tuberculosis: Secondary | ICD-10-CM | POA: Diagnosis not present

## 2022-06-07 NOTE — Progress Notes (Unsigned)
Initial visit. TB screen for employment

## 2022-06-11 ENCOUNTER — Encounter: Payer: Self-pay | Admitting: Internal Medicine

## 2022-06-11 ENCOUNTER — Other Ambulatory Visit (HOSPITAL_COMMUNITY): Payer: Self-pay

## 2022-06-11 ENCOUNTER — Ambulatory Visit (INDEPENDENT_AMBULATORY_CARE_PROVIDER_SITE_OTHER): Payer: 59 | Admitting: Internal Medicine

## 2022-06-11 VITALS — BP 120/80 | HR 68 | Ht 66.0 in | Wt 326.0 lb

## 2022-06-11 DIAGNOSIS — E278 Other specified disorders of adrenal gland: Secondary | ICD-10-CM

## 2022-06-11 MED ORDER — DEXAMETHASONE 1 MG PO TABS
1.0000 mg | ORAL_TABLET | Freq: Once | ORAL | 0 refills | Status: AC
  Filled 2022-06-11: qty 1, 1d supply, fill #0

## 2022-06-20 ENCOUNTER — Encounter: Payer: Self-pay | Admitting: Cardiovascular Disease

## 2022-06-20 ENCOUNTER — Other Ambulatory Visit (INDEPENDENT_AMBULATORY_CARE_PROVIDER_SITE_OTHER): Payer: 59

## 2022-06-20 DIAGNOSIS — E278 Other specified disorders of adrenal gland: Secondary | ICD-10-CM

## 2022-06-20 LAB — CORTISOL: Cortisol, Plasma: 0.7 ug/dL

## 2022-06-20 NOTE — Progress Notes (Signed)
Cardiology Office Note   Date:  06/22/2022   ID:  Nellene Courtois, DOB [birth year], MRN [id number]  PCP:  Anne Ng, NP  Cardiologist:   Kristeen Miss, MD   Chief Complaint  Patient presents with   Hypertension        Congestive Heart Failure   Problem list 1. Hypertensive emergency 2. Morbid obesity 3. Pulmonary hypertension-moderate 4. Possible sleep apnea    Amber Bentley is a 46 y.o. adult who presents for follow-up of her recent hospitalization for hypertensive emergency.  Anasofia has had high blood pressure for several years but has not been on medications.  I saw her in the [facility] emergency. Room. She has been started on medications. She was in the hospital for 4 days  She seems to be tolerating her medications without any problems.  She had an echocardiogram which revealed mildly depressed left ventricle systolic function with an EF of 45-50%. She has grade 2 diastolic dysfunction.  She has moderate pulmonary hypertension with an estimate PA pressure of 59.  Is avoiding salt. 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  Is a Administrator .  Smokes some .    April 08, 2017:  Ofelia is seen as a work in visit today for chest pain . Stopped smoking in March .  Has gained some weight .  Wt. Today is 303 .   Has cluster headaches.   Wt Readings from Last 3 Encounters:  06/22/22 (!) 325 lb 12.8 oz (147.8 kg)  06/11/22 (!) 326 lb (147.9 kg)  05/31/22 (!) 326 lb 9.6 oz (148.1 kg)    Laken started having some CP several years ago . Overall had a pretty good weekend - not much in the way of CP. The CP is a heaviness,   Last for hours.   Lasted most of the day yesterday . Seems to related to upper body movement - was not worsened with walking .     Has a significant family hx of CAD   [name], 2018: Chyenne is seen  Wt is 333.   Has not been exercising .    Teaches preschool .  Has 2 children - age 32 ( son) and 88 ( daughter) . Eats poorly,   Knows that she eats the wrong foods.  Blames stress   BP has been stable , no heart failure symptoms  Has cluster headaches.   Developed a MRSA infection on her nose related to her CPAP .   Apr 16, 2018: Nickia is seen today for follow-up of her hypertension and chronic combined systolic and diastolic congestive heart failure.  Her blood pressures is well controlled.  Wt = 311 ( down  from 324 on March, 2019  Has lost 45 lbs total   Has been having some dizziness since she has been losing weight  Lots of orthostatic hypotension   Still has chest pain  - especially if she puls her CPAP off at night   Has stopped smoking   Is exercising .  Walks 4 days a week .   Breakfast - 2 egg omlett with thin slice of cheese on a wrap , fruit  Lunch -  Lean cuisine, yogart, cup of fruit Dinner - 6 oz of lean meat, 2 cups of veggies  [name], 2019:  Has continued to lose weight .  Seen with wife ,  [name].   Wt is 302 now .    Has mild CP  Still doing well with her diet  Exercising .     Apr 27, 2020:  [name] is seen today.  She called a week or so ago and had marked hypertension and shortness of breath.  Unfortunately, she is regained weight covid weight .    Her weight today is 343 pounds.  She was very sick several weeks ago.   Thought she had covid. Tested negative twice. Symptoms lingered for 2-3 days .  Did not lose her sense of taste or smell.  We added back  lasix prn.   ,  Increased her coreg.     Was seen by Vin in late April ECG showed TWI   June 17, 2020:  Stevee  is seen today for follow-up visit.  She has a history of congestive heart failure, hypertension, hyperlipidemia, obesity.  She gained a lot of weight since I last saw her. Wt today is 340 lbs. ( down 3 lbs)  She is an emotional eater.  Eats sweats after work.    September 20, 2020:  Jakaria is seen today for follow-up visit.   She has a history of obesity, congestive heart failure, hypertension, hyperlipidemia.  Her weight today is 344 pounds which is up 4 pounds from her last visit. Life is ok Had covid this past summer.  Had been vaccinated.   Was sick for 5 days.   Got monoclonal antibodies.  Is not paying attention to salt intake We discussed using Kdur.   Nov. 2, 2022: Kaytelynn is seen  Wt is 342 Is in a good spot We discussed the difficulties on standing on a good diet.  Blood pressure is up a little bit.  Will increase spironolactone to 50 mg a day.  She will continue to work on a good low-salt low sugar diet.  June 22, 2022 Tremaine is seen today  Wt is 325 lbs ( down 17 lbs )  On ozympic  No CP ,  walks with the dogs   Past Medical History:  Diagnosis Date   Acute combined systolic and diastolic congestive heart failure ([place]) 02/21/2017   Acute respiratory failure with hypoxia (HCC) 12/11/2021   Anemia    Anxiety    Cardiomegaly    Chest pain    CHF (congestive heart failure) (HCC)    Congestive heart failure (HCC)    Constipation    Depression    Dyspnea    Fatigue    Food allergy    celery   GERD (gastroesophageal reflux disease)    Hip pain    HLD (hyperlipidemia)    HTN (hypertension)    Infertility, female    Lactose intolerance    Leg edema    Lower back pain    OSA on CPAP    Palpitations    Prediabetes    Shoulder pain    SOB (shortness of breath)    Stomach ulcer    Varicose vein of leg    Wheezing 12/11/2021    Past Surgical History:  Procedure Laterality Date   cervix removal N/A    ENDOMETRIAL ABLATION  2007   prior to hysterectomy   TOTAL ABDOMINAL HYSTERECTOMY       Current Outpatient Medications  Medication Sig Dispense Refill   albuterol (PROVENTIL) (2.5 MG/3ML) 0.083% nebulizer solution Inhale 1 vial via nebulizer every 6 (six) hours as needed for wheezing or shortness of breath. 150 mL 0  albuterol (VENTOLIN HFA) 108 (90 Base) MCG/ACT inhaler Inhale 2  puffs into the lungs every 2 (two) hours as needed for wheezing or shortness of breath. (max of 12 puffs per day) 8.5 g 0   budesonide-formoterol (SYMBICORT) 80-4.5 MCG/ACT inhaler Inhale 2 puffs into the lungs 2 (two) times daily. Rinse mouth after each use 10.2 g 0   carvedilol (COREG) 25 MG tablet Take 1 tablet (25 mg total) by mouth 2 (two) times daily with a meal. 180 tablet 3   furosemide (LASIX) 20 MG tablet Take daily only as needed for fluid retention 30 tablet 3   hydrALAZINE (APRESOLINE) 25 MG tablet Take 1 tablet  by mouth 2  times daily. Take 1 tablet at midday if BP>140/80 180 tablet 1   losartan (COZAAR) 100 MG tablet Take 1 tablet (100 mg total) by mouth daily. 90 tablet 3   pantoprazole (PROTONIX) 40 MG tablet TAKE 1 TABLET (40 MG TOTAL) BY MOUTH DAILY. 90 tablet 3   rosuvastatin (CRESTOR) 10 MG tablet TAKE 1 TABLET BY MOUTH ONCE DAILY 90 tablet 3   Semaglutide, 2 MG/DOSE, (OZEMPIC, 2 MG/DOSE,) 8 MG/3ML SOPN Inject 2 mg into the skin once a week. 15 mL 1   spironolactone (ALDACTONE) 50 MG tablet Take 1 tablet (50 mg total) by mouth daily. 90 tablet 3   No current facility-administered medications for this visit.        No data to display            Allergies:   Food and Aleve [naproxen]    Social History:  The patient  reports that Darcie A. Franzoni quit smoking about 6 months ago. Kylii A. Eriksson's smoking use included cigarettes. Sharlon A. Medders has a 2.50 pack-year smoking history. Yara A. Caprio has never used smokeless tobacco. Lindzey A. Peppel reports current alcohol use. Kyanna A. Werntz reports that Blayklee A. Ledo does not use drugs.   Family History:  The patient's family history includes Anxiety disorder in Kaniesha A. Delap's father; Breast cancer in Shaketta A. Aron's maternal aunt, maternal grandmother, mother, and paternal grandmother; Cancer in Kela A. Rinck's maternal aunt, maternal grandmother, mother, paternal grandfather, and sister; Depression in Yannely A. Padmore's  father and mother; Diabetes in Rodolfo A. Dorion's father; Heart disease (age of onset: 97) in Clema A. Elsberry's father; Hyperlipidemia in Janece A. Stetzel's father; Hypertension in Lillie A. Dedman's father and mother; Kidney disease in Lucillia A. Mcalpine's mother; Obesity in Adiel A. Godette's father and mother; Sleep apnea in Tomeka A. Leach's father; Sudden death in Mallery A. Smeltz's brother; Thyroid disease in Demaris A. Labrecque's sister.    ROS:    Noted in current history, otherwise review of systems is negative.  Physical Exam: Blood pressure (!) 145/90, pulse 79, height 5\' 6"  (1.676 m), weight (!) 325 lb 12.8 oz (147.8 kg), SpO2 96 %.  GEN:  young female, obese, very pleasant,  in good spirits  HEENT: Normal NECK: No JVD; No carotid bruits LYMPHATICS: No lymphadenopathy CARDIAC: RRR , no murmurs, rubs, gallops RESPIRATORY:  Clear to auscultation without rales, wheezing or rhonchi  ABDOMEN: Soft, non-tender, non-distended MUSCULOSKELETAL:  No edema; No deformity  SKIN: Warm and dry NEUROLOGIC:  Alert and oriented x 3     EKG:     Recent Labs: 12/11/2021: B Natriuretic Peptide 40.4 12/21/2021: Magnesium 2.1 01/10/2022: ALT 27; BUN 11; Creatinine, Ser 1.05; Hemoglobin 13.6; Platelets 203.0; Potassium 4.3; Sodium 140 03/26/2022: TSH 1.22    Lipid Panel  Component Value Date/Time   CHOL 167 10/27/2019 0800   TRIG 64 10/27/2019 0800   HDL 51 10/27/2019 0800   CHOLHDL 3.3 04/08/2017 1124   CHOLHDL 4.7 02/22/2017 0420   VLDL 11 02/22/2017 0420   LDLCALC 104 (H) 10/27/2019 0800      Wt Readings from Last 3 Encounters:  06/22/22 (!) 325 lb 12.8 oz (147.8 kg)  06/11/22 (!) 326 lb (147.9 kg)  05/31/22 (!) 326 lb 9.6 oz (148.1 kg)      Other studies Reviewed: Additional studies/ records that were reviewed today include: . Review of the above records demonstrates:    ASSESSMENT AND PLAN:  1. Chest pain:    no further cp   2.   Hypertensive emergency:    BP is still mildly elevated.   Continue current meds.    3.  Pulmonary hypertension:  -     4. Morbid obesity:        has lost 17 lbs. Great job. Encouraged more weight loss   5.  Chronic diastolic congestive heart failure.  She is no longer having any shortness of breath.     Current medicines are reviewed at length with the patient today.  The patient does not have concerns regarding medicines.  Labs/ tests ordered today include:   No orders of the defined types were placed in this encounter.    Disposition:   FU with me in 12 months    Mertie Moores, MD  06/22/2022 [facility] [address], [place], [place]  [address] Phone: [phone]; Fax: [phone]

## 2022-06-21 LAB — DHEA-SULFATE: DHEA-SO4: 21 ug/dL (ref 15–205)

## 2022-06-22 ENCOUNTER — Encounter: Payer: Self-pay | Admitting: Cardiovascular Disease

## 2022-06-22 ENCOUNTER — Ambulatory Visit (INDEPENDENT_AMBULATORY_CARE_PROVIDER_SITE_OTHER): Payer: 59 | Admitting: Cardiovascular Disease

## 2022-06-22 ENCOUNTER — Other Ambulatory Visit (HOSPITAL_COMMUNITY): Payer: Self-pay

## 2022-06-22 VITALS — BP 145/90 | HR 79 | Ht 66.0 in | Wt 325.8 lb

## 2022-06-22 DIAGNOSIS — I110 Hypertensive heart disease with heart failure: Secondary | ICD-10-CM | POA: Diagnosis not present

## 2022-06-22 DIAGNOSIS — I43 Cardiomyopathy in diseases classified elsewhere: Secondary | ICD-10-CM

## 2022-06-22 MED ORDER — FUROSEMIDE 20 MG PO TABS
ORAL_TABLET | ORAL | 3 refills | Status: DC
Start: 2022-06-22 — End: 2023-07-23
  Filled 2022-06-22: qty 30, 30d supply, fill #0
  Filled 2022-08-21: qty 30, 30d supply, fill #1
  Filled 2023-06-17: qty 30, 30d supply, fill #2

## 2022-06-22 NOTE — Patient Instructions (Signed)
Medication Instructions:  TAKE Furosemide AS NEEDED only *If you need a refill on your cardiac medications before your next appointment, please call your pharmacy*   Lab Work: NONE If you have labs (blood work) drawn today and your tests are completely normal, you will receive your results only by: MyChart Message (if you have MyChart) OR A paper copy in the mail If you have any lab test that is abnormal or we need to change your treatment, we will call you to review the results.   Testing/Procedures: NONE   Follow-Up: At [facility], you and your health needs are our priority.  As part of our continuing mission to provide you with exceptional heart care, we have created designated Provider Care Teams.  These Care Teams include your primary Cardiologist (physician) and Advanced Practice Providers (APPs -  Physician Assistants and Nurse Practitioners) who all work together to provide you with the care you need, when you need it.  Your next appointment:   1 year(s)  The format for your next appointment:   In Person  Provider:   Kristeen Miss, MD      Important Information About Sugar

## 2022-06-28 DIAGNOSIS — I159 Secondary hypertension, unspecified: Secondary | ICD-10-CM | POA: Diagnosis not present

## 2022-06-28 DIAGNOSIS — I5032 Chronic diastolic (congestive) heart failure: Secondary | ICD-10-CM | POA: Diagnosis not present

## 2022-06-28 DIAGNOSIS — I5041 Acute combined systolic (congestive) and diastolic (congestive) heart failure: Secondary | ICD-10-CM | POA: Diagnosis not present

## 2022-07-25 ENCOUNTER — Encounter (INDEPENDENT_AMBULATORY_CARE_PROVIDER_SITE_OTHER): Payer: Self-pay

## 2022-07-29 DIAGNOSIS — I159 Secondary hypertension, unspecified: Secondary | ICD-10-CM | POA: Diagnosis not present

## 2022-07-29 DIAGNOSIS — I5041 Acute combined systolic (congestive) and diastolic (congestive) heart failure: Secondary | ICD-10-CM | POA: Diagnosis not present

## 2022-07-29 DIAGNOSIS — I5032 Chronic diastolic (congestive) heart failure: Secondary | ICD-10-CM | POA: Diagnosis not present

## 2022-08-14 ENCOUNTER — Other Ambulatory Visit (HOSPITAL_COMMUNITY): Payer: Self-pay

## 2022-08-15 ENCOUNTER — Other Ambulatory Visit (HOSPITAL_COMMUNITY): Payer: Self-pay

## 2022-08-16 ENCOUNTER — Other Ambulatory Visit (HOSPITAL_COMMUNITY): Payer: Self-pay

## 2022-08-21 ENCOUNTER — Other Ambulatory Visit (HOSPITAL_COMMUNITY): Payer: Self-pay

## 2022-08-22 ENCOUNTER — Ambulatory Visit: Payer: 59

## 2022-08-22 ENCOUNTER — Encounter: Payer: Self-pay | Admitting: Nurse Practitioner

## 2022-08-22 ENCOUNTER — Ambulatory Visit (INDEPENDENT_AMBULATORY_CARE_PROVIDER_SITE_OTHER): Payer: 59

## 2022-08-22 DIAGNOSIS — Z23 Encounter for immunization: Secondary | ICD-10-CM | POA: Diagnosis not present

## 2022-08-22 NOTE — Progress Notes (Signed)
..  After obtaining consent, and per orders of [facility], injection of influenza vaccine given by Philipp Deputy. Patient instructed to remain in clinic for 20 minutes afterwards, and to report any adverse reaction to me immediately.

## 2022-08-29 DIAGNOSIS — I5041 Acute combined systolic (congestive) and diastolic (congestive) heart failure: Secondary | ICD-10-CM | POA: Diagnosis not present

## 2022-08-29 DIAGNOSIS — I159 Secondary hypertension, unspecified: Secondary | ICD-10-CM | POA: Diagnosis not present

## 2022-08-29 DIAGNOSIS — I5032 Chronic diastolic (congestive) heart failure: Secondary | ICD-10-CM | POA: Diagnosis not present

## 2022-09-28 DIAGNOSIS — I5041 Acute combined systolic (congestive) and diastolic (congestive) heart failure: Secondary | ICD-10-CM | POA: Diagnosis not present

## 2022-09-28 DIAGNOSIS — I5032 Chronic diastolic (congestive) heart failure: Secondary | ICD-10-CM | POA: Diagnosis not present

## 2022-09-28 DIAGNOSIS — I159 Secondary hypertension, unspecified: Secondary | ICD-10-CM | POA: Diagnosis not present

## 2022-10-02 ENCOUNTER — Other Ambulatory Visit (HOSPITAL_COMMUNITY): Payer: Self-pay

## 2022-10-15 ENCOUNTER — Encounter: Payer: Self-pay | Admitting: Nurse Practitioner

## 2022-10-29 DIAGNOSIS — I159 Secondary hypertension, unspecified: Secondary | ICD-10-CM | POA: Diagnosis not present

## 2022-10-29 DIAGNOSIS — I5041 Acute combined systolic (congestive) and diastolic (congestive) heart failure: Secondary | ICD-10-CM | POA: Diagnosis not present

## 2022-10-29 DIAGNOSIS — I5032 Chronic diastolic (congestive) heart failure: Secondary | ICD-10-CM | POA: Diagnosis not present

## 2022-10-30 ENCOUNTER — Other Ambulatory Visit (HOSPITAL_COMMUNITY): Payer: Self-pay

## 2022-11-10 ENCOUNTER — Other Ambulatory Visit: Payer: Self-pay | Admitting: Cardiovascular Disease

## 2022-11-10 ENCOUNTER — Other Ambulatory Visit (HOSPITAL_COMMUNITY): Payer: Self-pay

## 2022-11-13 ENCOUNTER — Other Ambulatory Visit (HOSPITAL_COMMUNITY): Payer: Self-pay

## 2022-11-13 MED ORDER — SPIRONOLACTONE 50 MG PO TABS
50.0000 mg | ORAL_TABLET | Freq: Every day | ORAL | 2 refills | Status: DC
  Filled 2022-11-13: qty 90, 90d supply, fill #0
  Filled 2023-02-25: qty 90, 90d supply, fill #1
  Filled 2023-06-26: qty 90, 90d supply, fill #2

## 2022-11-28 DIAGNOSIS — I5041 Acute combined systolic (congestive) and diastolic (congestive) heart failure: Secondary | ICD-10-CM | POA: Diagnosis not present

## 2022-11-28 DIAGNOSIS — I159 Secondary hypertension, unspecified: Secondary | ICD-10-CM | POA: Diagnosis not present

## 2022-11-28 DIAGNOSIS — I5032 Chronic diastolic (congestive) heart failure: Secondary | ICD-10-CM | POA: Diagnosis not present

## 2022-12-04 ENCOUNTER — Other Ambulatory Visit: Payer: Self-pay | Admitting: Nurse Practitioner

## 2022-12-04 DIAGNOSIS — I110 Hypertensive heart disease with heart failure: Secondary | ICD-10-CM

## 2022-12-05 ENCOUNTER — Other Ambulatory Visit (HOSPITAL_COMMUNITY): Payer: Self-pay

## 2022-12-05 ENCOUNTER — Other Ambulatory Visit: Payer: Self-pay

## 2022-12-05 MED ORDER — LOSARTAN POTASSIUM 100 MG PO TABS
100.0000 mg | ORAL_TABLET | Freq: Every day | ORAL | 0 refills | Status: DC
  Filled 2022-12-05: qty 30, 30d supply, fill #0

## 2022-12-05 NOTE — Telephone Encounter (Signed)
Chart supports Rx Last OV: 05/2022 Next OV: not scheduled , needs f/u appt for further refills.

## 2022-12-24 ENCOUNTER — Other Ambulatory Visit (HOSPITAL_COMMUNITY): Payer: Self-pay

## 2023-01-02 ENCOUNTER — Encounter: Payer: Self-pay | Admitting: Nurse Practitioner

## 2023-01-02 ENCOUNTER — Other Ambulatory Visit (HOSPITAL_COMMUNITY): Payer: Self-pay

## 2023-01-04 ENCOUNTER — Other Ambulatory Visit (HOSPITAL_COMMUNITY): Payer: Self-pay

## 2023-01-04 NOTE — Telephone Encounter (Signed)
Patient Advocate Encounter   Received notification from [employer] that prior authorization for Ozempic 2MG /DOSE is required.   PA submitted on 01/04/2023 Key [id number] Status is pending

## 2023-01-07 ENCOUNTER — Other Ambulatory Visit (HOSPITAL_COMMUNITY): Payer: Self-pay

## 2023-01-09 ENCOUNTER — Other Ambulatory Visit: Payer: Self-pay

## 2023-01-09 NOTE — Telephone Encounter (Addendum)
Pharmacy Patient Advocate Encounter  Received notification from [employer] that the request for prior authorization for Ozempic has been denied due to .    Please be advised we currently do not have a Pharmacist to review denials, therefore you will need to process appeals accordingly as needed. Thanks for your support at this time.  E-appeal available (KEY: [id number])  You may call  [phone] or fax [phone], to appeal.

## 2023-01-10 ENCOUNTER — Telehealth: Payer: Self-pay | Admitting: Nurse Practitioner

## 2023-01-10 ENCOUNTER — Other Ambulatory Visit: Payer: Self-pay | Admitting: Nurse Practitioner

## 2023-01-10 DIAGNOSIS — I110 Hypertensive heart disease with heart failure: Secondary | ICD-10-CM

## 2023-01-10 NOTE — Telephone Encounter (Signed)
Caller Name: Charmel Call back phone #: [phone]  Reason for Call: Pt called to ask about a PA for ozempic. She said she sent a my chart message a week ago. I booked an appt for her on Monday due to her having her last appt in June 2023

## 2023-01-11 NOTE — Telephone Encounter (Signed)
Ozempic was denied due to patient not having type 2 diabetes. Please see patient message on 01/02/23.

## 2023-01-11 NOTE — Telephone Encounter (Signed)
My Chart message sent

## 2023-01-14 ENCOUNTER — Other Ambulatory Visit (HOSPITAL_COMMUNITY): Payer: Self-pay

## 2023-01-14 ENCOUNTER — Encounter: Payer: Self-pay | Admitting: Nurse Practitioner

## 2023-01-14 ENCOUNTER — Other Ambulatory Visit: Payer: Self-pay

## 2023-01-14 ENCOUNTER — Ambulatory Visit (INDEPENDENT_AMBULATORY_CARE_PROVIDER_SITE_OTHER): Payer: 59 | Admitting: Nurse Practitioner

## 2023-01-14 DIAGNOSIS — R7303 Prediabetes: Secondary | ICD-10-CM

## 2023-01-14 DIAGNOSIS — E7800 Pure hypercholesterolemia, unspecified: Secondary | ICD-10-CM | POA: Diagnosis not present

## 2023-01-14 DIAGNOSIS — Z720 Tobacco use: Secondary | ICD-10-CM | POA: Diagnosis not present

## 2023-01-14 DIAGNOSIS — F3341 Major depressive disorder, recurrent, in partial remission: Secondary | ICD-10-CM

## 2023-01-14 LAB — COMPREHENSIVE METABOLIC PANEL
ALT: 20 U/L (ref 0–35)
AST: 14 U/L (ref 0–37)
Albumin: 4.2 g/dL (ref 3.5–5.2)
Alkaline Phosphatase: 53 U/L (ref 39–117)
BUN: 18 mg/dL (ref 6–23)
CO2: 30 mEq/L (ref 19–32)
Calcium: 9.3 mg/dL (ref 8.4–10.5)
Chloride: 105 mEq/L (ref 96–112)
Creatinine, Ser: 1.1 mg/dL (ref 0.40–1.20)
GFR: 61.23 mL/min (ref >60.00)
Glucose, Bld: 109 mg/dL — ABNORMAL HIGH (ref 70–99)
Potassium: 4.4 mEq/L (ref 3.5–5.1)
Sodium: 145 mEq/L (ref 135–145)
Total Bilirubin: 0.3 mg/dL (ref 0.2–1.2)
Total Protein: 6.7 g/dL (ref 6.0–8.3)

## 2023-01-14 LAB — LIPID PANEL
Cholesterol: 171 mg/dL (ref 0–200)
HDL: 46 mg/dL (ref >39.00)
LDL Cholesterol: 109 mg/dL — ABNORMAL HIGH (ref 0–99)
NonHDL: 124.85
Total CHOL/HDL Ratio: 4
Triglycerides: 80 mg/dL (ref 0.0–149.0)
VLDL: 16 mg/dL (ref 0.0–40.0)

## 2023-01-14 LAB — HEMOGLOBIN A1C: Hgb A1c MFr Bld: 5.7 % (ref 4.6–6.5)

## 2023-01-14 MED ORDER — BUPROPION HCL ER (SR) 150 MG PO TB12
150.0000 mg | ORAL_TABLET | ORAL | 5 refills | Status: DC
  Filled 2023-01-14: qty 60, 32d supply, fill #0

## 2023-01-14 MED ORDER — ZEPBOUND 5 MG/0.5ML ~~LOC~~ SOAJ
5.0000 mg | SUBCUTANEOUS | 0 refills | Status: DC
  Filled 2023-01-14: qty 2, 28d supply, fill #0

## 2023-01-14 NOTE — Progress Notes (Signed)
Established Patient Visit  Patient: Amber Bentley   DOB: [birth year]   47 y.o. Adult  MRN: [id number] Visit Date: 01/14/2023  Subjective:    Chief Complaint  Patient presents with   Office Visit    Weight management  New Rx for ozempic  Med refills  No other concerns    HPI Tobacco abuse 1/4ppd-1/2ppd Quit for 7yrs  then resumed in 2018, no med or patch used in past. Previous use of wellbutrin 36yrs ago for depression. Does not want nidoderm due to adhesive sensitivity  Agrees to start wellbutrin 150mg  BID Advised about need for quit date F/up in 30month  Morbid obesity ([place]) No longer able to afford ozempic due to lack of insurance coverage. Previous participation in [facility] weight management program 2019-2021: lost total of 13Lbs. She has lost total of 38lbs with ozempic in last 67months. Exercise: walking daily, 15-27mins each time Diet: low fat, high protein Wt Readings from Last 3 Encounters:  01/14/23 (!) 305 lb 3.2 oz (138.4 kg)  06/22/22 (!) 325 lb 12.8 oz (147.8 kg)  06/11/22 (!) 326 lb (147.9 kg)    Switched med to zepbound. Advised to increased duration of exercise and incorporate strengthening exercise. Repeat hgbA1c, Cmp, lipid panel F/up in 61month  Prediabetes Repeat hgbA1c  Depression, recurrent ([place]) Waxing and waning Start wellbutrin  BP Readings from Last 3 Encounters:  01/14/23 126/86  06/22/22 (!) 145/90  06/11/22 120/80     Reviewed medical, surgical, and social history today  Medications: Outpatient Medications Prior to Visit  Medication Sig   albuterol (PROVENTIL) (2.5 MG/3ML) 0.083% nebulizer solution Inhale 1 vial via nebulizer every 6 (six) hours as needed for wheezing or shortness of breath.   albuterol (VENTOLIN HFA) 108 (90 Base) MCG/ACT inhaler Inhale 2 puffs into the lungs every 2 (two) hours as needed for wheezing or shortness of breath. (max of 12 puffs per day)   budesonide-formoterol (SYMBICORT) 80-4.5  MCG/ACT inhaler Inhale 2 puffs into the lungs 2 (two) times daily. Rinse mouth after each use   carvedilol (COREG) 25 MG tablet Take 1 tablet (25 mg total) by mouth 2 (two) times daily with a meal.   furosemide (LASIX) 20 MG tablet Take daily only as needed for fluid retention   hydrALAZINE (APRESOLINE) 25 MG tablet Take 1 tablet  by mouth 2  times daily. Take 1 tablet at midday if BP>140/80   losartan (COZAAR) 100 MG tablet Take 1 tablet (100 mg total) by mouth daily.   pantoprazole (PROTONIX) 40 MG tablet TAKE 1 TABLET (40 MG TOTAL) BY MOUTH DAILY.   spironolactone (ALDACTONE) 50 MG tablet Take 1 tablet (50 mg total) by mouth daily.   rosuvastatin (CRESTOR) 10 MG tablet TAKE 1 TABLET BY MOUTH ONCE DAILY (Patient not taking: Reported on 01/14/2023)   [DISCONTINUED] Semaglutide, 2 MG/DOSE, (OZEMPIC, 2 MG/DOSE,) 8 MG/3ML SOPN Inject 2 mg into the skin once a week. (Patient not taking: Reported on 01/14/2023)   No facility-administered medications prior to visit.   Reviewed past medical and social history.   ROS per HPI above  Last metabolic panel Lab Results  Component Value Date   GLUCOSE 109 (H) 01/14/2023   NA 145 01/14/2023   K 4.4 01/14/2023   CL 105 01/14/2023   CO2 30 01/14/2023   BUN 18 01/14/2023   CREATININE 1.10 01/14/2023   GFRNONAA >60 12/11/2021   CALCIUM 9.3 01/14/2023   PROT  6.7 01/14/2023   ALBUMIN 4.2 01/14/2023   LABGLOB 2.4 10/27/2019   AGRATIO 1.8 10/27/2019   BILITOT 0.3 01/14/2023   ALKPHOS 53 01/14/2023   AST 14 01/14/2023   ALT 20 01/14/2023   ANIONGAP 8 12/11/2021   Last lipids Lab Results  Component Value Date   CHOL 171 01/14/2023   HDL 46.00 01/14/2023   LDLCALC 109 (H) 01/14/2023   TRIG 80.0 01/14/2023   CHOLHDL 4 01/14/2023   Last hemoglobin A1c Lab Results  Component Value Date   HGBA1C 5.7 01/14/2023        Objective:  BP 126/86 (BP Location: Right Arm, Patient Position: Sitting, Cuff Size: Normal)   Pulse 63   Temp 97.8 F (36.6  C) (Temporal)   Ht 5\' 6"  (1.676 m)   Wt (!) 305 lb 3.2 oz (138.4 kg)   SpO2 95%   BMI 49.26 kg/m      Physical Exam Vitals reviewed.  Constitutional:      Appearance: Amber Bentley is obese.  Cardiovascular:     Rate and Rhythm: Normal rate and regular rhythm.     Pulses: Normal pulses.     Heart sounds: Normal heart sounds.  Pulmonary:     Effort: Pulmonary effort is normal.     Breath sounds: Normal breath sounds.  Musculoskeletal:     Right lower leg: No edema.     Left lower leg: No edema.  Neurological:     Mental Status: Amber Bentley is alert and oriented to person, place, and time.     Results for orders placed or performed in visit on 01/14/23  Hemoglobin A1c  Result Value Ref Range   Hgb A1c MFr Bld 5.7 4.6 - 6.5 %  Comprehensive metabolic panel  Result Value Ref Range   Sodium 145 135 - 145 mEq/L   Potassium 4.4 3.5 - 5.1 mEq/L   Chloride 105 96 - 112 mEq/L   CO2 30 19 - 32 mEq/L   Glucose, Bld 109 (H) 70 - 99 mg/dL   BUN 18 6 - 23 mg/dL   Creatinine, Ser 01/16/23 0.40 - 1.20 mg/dL   Total Bilirubin 0.3 0.2 - 1.2 mg/dL   Alkaline Phosphatase 53 39 - 117 U/L   AST 14 0 - 37 U/L   ALT 20 0 - 35 U/L   Total Protein 6.7 6.0 - 8.3 g/dL   Albumin 4.2 3.5 - 5.2 g/dL   GFR 1.61 09.60 mL/min   Calcium 9.3 8.4 - 10.5 mg/dL  Lipid panel  Result Value Ref Range   Cholesterol 171 0 - 200 mg/dL   Triglycerides >45.40 0.0 - 149.0 mg/dL   HDL 98.1 19.14 mg/dL   VLDL >78.29 0.0 - 56.2 mg/dL   LDL Cholesterol 13.0 (H) 0 - 99 mg/dL   Total CHOL/HDL Ratio 4    NonHDL 124.85       Assessment & Plan:    Problem List Items Addressed This Visit       Other   Depression, recurrent (H[facility])    Waxing and waning Start wellbutrin      Relevant Medications   buPROPion (WELLBUTRIN SR) 150 MG 12 hr tablet   Morbid obesity ([facility]) - Primary    No longer able to afford ozempic due to lack of insurance coverage. Previous participation in [facility] weight management  program 2019-2021: lost total of 13Lbs. She has lost total of 38lbs with ozempic in last 66months. Exercise: walking daily, 15-45mins each time Diet: low  fat, high protein Wt Readings from Last 3 Encounters:  01/14/23 (!) 305 lb 3.2 oz (138.4 kg)  06/22/22 (!) 325 lb 12.8 oz (147.8 kg)  06/11/22 (!) 326 lb (147.9 kg)    Switched med to zepbound. Advised to increased duration of exercise and incorporate strengthening exercise. Repeat hgbA1c, Cmp, lipid panel F/up in 67month      Relevant Medications   tirzepatide (ZEPBOUND) 5 MG/0.5ML Pen   Other Relevant Orders   Hemoglobin A1c (Completed)   Comprehensive metabolic panel (Completed)   Lipid panel (Completed)   Prediabetes    Repeat hgbA1c      Relevant Medications   tirzepatide (ZEPBOUND) 5 MG/0.5ML Pen   Other Relevant Orders   Hemoglobin A1c (Completed)   Tobacco abuse    1/4ppd-1/2ppd Quit for 65yrs  then resumed in 2018, no med or patch used in past. Previous use of wellbutrin 5yrs ago for depression. Does not want nidoderm due to adhesive sensitivity  Agrees to start wellbutrin 150mg  BID Advised about need for quit date F/up in 61month      Relevant Medications   buPROPion (WELLBUTRIN SR) 150 MG 12 hr tablet   Other Visit Diagnoses     Elevated LDL cholesterol level       Relevant Medications   tirzepatide (ZEPBOUND) 5 MG/0.5ML Pen   Other Relevant Orders   Lipid panel (Completed)      Return in about 4 weeks (around 02/11/2023) for Weight management and tobacco use.Wilfred Lacy, NP

## 2023-01-14 NOTE — Assessment & Plan Note (Addendum)
No longer able to afford ozempic due to lack of insurance coverage. Previous participation in [facility] weight management program 2019-2021: lost total of 13Lbs. She has lost total of 38lbs with ozempic in last 20months. Exercise: walking daily, 15-57mins each time Diet: low fat, high protein Wt Readings from Last 3 Encounters:  01/14/23 (!) 305 lb 3.2 oz (138.4 kg)  06/22/22 (!) 325 lb 12.8 oz (147.8 kg)  06/11/22 (!) 326 lb (147.9 kg)    Switched med to zepbound. Advised to increased duration of exercise and incorporate strengthening exercise. Repeat hgbA1c, Cmp, lipid panel F/up in 25month

## 2023-01-14 NOTE — Assessment & Plan Note (Signed)
Repeat hgbA1c 

## 2023-01-14 NOTE — Patient Instructions (Addendum)
Go to lab Increase exercise to 2-38mins cardio and 15-45mins strength training. Follow diet plan provided by weight management clinic in past. You will be contacted once PA process is complete.  Bupropion Sustained-Release Tablets (Smoking Cessation) What is this medication? BUPROPION (byoo PROE pee on) helps you quit smoking. It reduces cravings for nicotine, the addictive substance found in tobacco. It may also help reduce symptoms of withdrawal. It is most effective when used in combination with a stop-smoking program. This medicine may be used for other purposes; ask your health care provider or pharmacist if you have questions. COMMON BRAND NAME(S): Buproban, Zyban What should I tell my care team before I take this medication? They need to know if you have any of these conditions: An eating disorder, such as anorexia or bulimia Bipolar disorder or psychosis Diabetes or high blood sugar, treated with medication Glaucoma Head injury or brain tumor Heart disease, previous heart attack, or irregular heart beat High blood pressure Kidney or liver disease Seizures Suicidal thoughts or a previous suicide attempt Tourette's syndrome Weight loss An unusual or allergic reaction to bupropion, other medications, foods, dyes, or preservatives Pregnant or trying to become pregnant Breast-feeding How should I use this medication? Take this medication by mouth with a glass of water. Follow the directions on the prescription label. You can take it with or without food. If it upsets your stomach, take it with food. Do not cut, crush or chew this medication. Take your medication at regular intervals. If you take this medication more than once a day, take your second dose at least 8 hours after you take your first dose. To limit trouble in sleeping, avoid taking this medication at bedtime. Do not take your medication more often than directed. Do not stop taking this medication suddenly except upon the  advice of your care team. Stopping this medication too quickly may cause serious side effects. A special MedGuide will be given to you by the pharmacist with each prescription and refill. Be sure to read this information carefully each time. Talk to your care team about the use of this medication in children. Special care may be needed. Overdosage: If you think you have taken too much of this medicine contact a poison control center or emergency room at once. NOTE: This medicine is only for you. Do not share this medicine with others. What if I miss a dose? If you miss a dose, skip the missed dose and take your next tablet at the regular time. There should be at least 8 hours between doses. Do not take double or extra doses. What may interact with this medication? Do not take this medication with any of the following: Linezolid MAOIs like Azilect, Carbex, Eldepryl, Marplan, Nardil, and Parnate Methylene blue (injected into a vein) Other medications that contain bupropion like Wellbutrin This medication may also interact with the following: Alcohol Certain medications for anxiety or sleep Certain medications for blood pressure like metoprolol, propranolol Certain medications for depression or psychotic disturbances Certain medications for HIV or AIDS like efavirenz, lopinavir, nelfinavir, ritonavir Certain medications for irregular heart beat like propafenone, flecainide Certain medications for Parkinson's disease like amantadine, levodopa Certain medications for seizures like carbamazepine, phenytoin, phenobarbital Cimetidine Clopidogrel Cyclophosphamide Digoxin Furazolidone Isoniazid Nicotine Orphenadrine Procarbazine Steroid medications like prednisone or cortisone Stimulant medications for attention disorders, weight loss, or to stay awake Tamoxifen Theophylline Thiotepa Ticlopidine Tramadol Warfarin This list may not describe all possible interactions. Give your health care  provider a list of all  the medicines, herbs, non-prescription drugs, or dietary supplements you use. Also tell them if you smoke, drink alcohol, or use illegal drugs. Some items may interact with your medicine. What should I watch for while using this medication? Visit your care team for regular checks on your progress. This medication should be used together with a patient support program. It is important to participate in a behavioral program, counseling, or other support program that is recommended by your care team. Watch for new or worsening thoughts of suicide or depression. This includes sudden changes in mood, behavior, or thoughts. These changes can happen at any time but are more common in the beginning of treatment or after a change in dose. Call your care team right away if you experience these thoughts or worsening depression. Manic episodes may happen in patients with bipolar disorder who take this medication. Watch for changes in feelings or behaviors such as feeling anxious, nervous, agitated, panicky, irritable, hostile, aggressive, impulsive, severely restless, overly excited and hyperactive, or trouble sleeping. These symptoms can happen at anytime but are more common in the beginning of treatment or after a change in dose. Call your care team right away if you notice any of these symptoms. This medication may cause serious skin reactions. They can happen weeks to months after starting the medication. Contact your care team right away if you notice fevers or flu-like symptoms with a rash. The rash may be red or purple and then turn into blisters or peeling of the skin. Or, you might notice a red rash with swelling of the face, lips or lymph nodes in your neck or under your arms. Avoid drinks that contain alcohol while taking this medication. Drinking large amounts of alcohol, using sleeping or anxiety medications, or quickly stopping the use of these agents while taking this medication may  increase your risk for a seizure. Do not drive or use heavy machinery until you know how this medication affects you. This medication can impair your ability to perform these tasks. Do not take this medication close to bedtime. It may prevent you from sleeping. Your mouth may get dry. Chewing sugarless gum or sucking hard candy, and drinking plenty of water may help. Contact your care team if the problem does not go away or is severe. Do not use nicotine patches or chewing gum without the advice of your care team while taking this medication. You may need to have your blood pressure taken regularly if your doctor recommends that you use both nicotine and this medication together. What side effects may I notice from receiving this medication? Side effects that you should report to your care team as soon as possible: Allergic reactions--skin rash, itching, hives, swelling of the face, lips, tongue, or throat Increase in blood pressure Mood and behavior changes--anxiety, nervousness, confusion, hallucinations, irritability, hostility, thoughts of suicide or self-harm, worsening mood, feelings of depression Redness, blistering, peeling, or loosening of the skin, including inside the mouth Seizures Sudden eye pain or change in vision such as blurry vision, seeing halos around lights, vision loss Side effects that usually do not require medical attention (report to your care team if they continue or are bothersome): Constipation Dizziness Dry mouth Loss of appetite Nausea Tremors or shaking Trouble sleeping This list may not describe all possible side effects. Call your doctor for medical advice about side effects. You may report side effects to FDA at 1-800-FDA-1088. Where should I keep my medication? Keep out of the reach of children and pets.  Store at room temperature between 20 and 25 degrees C (68 and 77 degrees F). Protect from light. Keep container tightly closed. Throw away any unused  medication after the expiration date. NOTE: This sheet is a summary. It may not cover all possible information. If you have questions about this medicine, talk to your doctor, pharmacist, or health care provider.  2023 Elsevier/Gold Standard (2020-12-05 00:00:00)

## 2023-01-14 NOTE — Assessment & Plan Note (Addendum)
1/4ppd-1/2ppd Quit for 98yrs  then resumed in 2018, no med or patch used in past. Previous use of wellbutrin 45yrs ago for depression. Does not want nidoderm due to adhesive sensitivity  Agrees to start wellbutrin 150mg  BID Advised about need for quit date F/up in 32month

## 2023-01-14 NOTE — Assessment & Plan Note (Signed)
Waxing and waning Start wellbutrin

## 2023-01-15 ENCOUNTER — Encounter: Payer: Self-pay | Admitting: Internal Medicine

## 2023-01-15 ENCOUNTER — Other Ambulatory Visit (HOSPITAL_COMMUNITY): Payer: Self-pay

## 2023-01-15 ENCOUNTER — Ambulatory Visit (INDEPENDENT_AMBULATORY_CARE_PROVIDER_SITE_OTHER): Payer: 59 | Admitting: Internal Medicine

## 2023-01-15 ENCOUNTER — Other Ambulatory Visit: Payer: Self-pay

## 2023-01-15 VITALS — BP 120/80 | HR 63 | Ht 66.0 in | Wt 306.0 lb

## 2023-01-15 DIAGNOSIS — E278 Other specified disorders of adrenal gland: Secondary | ICD-10-CM | POA: Diagnosis not present

## 2023-01-15 MED ORDER — DEXAMETHASONE 1 MG PO TABS
1.0000 mg | ORAL_TABLET | Freq: Once | ORAL | 0 refills | Status: AC
  Filled 2023-01-15: qty 1, 1d supply, fill #0

## 2023-01-15 NOTE — Progress Notes (Unsigned)
Name: Amber Bentley  MRN/ DOB: [id number], 01/09/1976    Age/ Sex: 47 y.o., adult    PCP: Flossie Buffy, NP   Reason for Endocrinology Evaluation: Adrenal Nodule      Date of Initial Endocrinology Evaluation: 01/15/2023     HPI:   Amber Bentley is a 47 y.o. adult with a past medical history of OSA, HTN, CHF. The patient presented for initial endocrinology clinic visit on 01/15/2023 for consultative assistance with Adrenal Nodule .   Pt was noted with an indeterminate 1.8 mm right adrenal nodule during evaluation for PE in 11/2021   She had labs done on 04/04/2022 with normal aldosterone at 15 ng/dL, renin 2.96  ng/mL/hr  , Aldo/renin ratio 5.1.  She also had normal plasma metanephrines and normetanephrine's  Serum cortisol was normal at 16.1 UG/DL   Dexamethasone suppression test was normal at 0.7 UG/DL 06/2022   Mother with questionable adrenal adenoma Sister s/p nephrectomy due to renal cell carcinoma  SUBJECTIVE:    Today (01/15/23):Amber Bentley is here for follow-up on adrenal adenoma.  Adrenal incidentaloma: Substantial weight gain- no, Pt lost weight- on Mounjaro  Severe hypertension- no  DM- no  Sudden/ severe headaches-  no Anxiety attacks- no  Cardiac arrhythmias- no Palpitations- no  Fluid retention- no  Hypokalemia- no     HISTORY:  Past Medical History:  Past Medical History:  Diagnosis Date  . Acute combined systolic and diastolic congestive heart failure ([place]) 02/21/2017  . Acute respiratory failure with hypoxia ([place]) 12/11/2021  . Anemia   . Anxiety   . Cardiomegaly   . Chest pain   . CHF (congestive heart failure) ([place])   . Congestive heart failure ([place])   . Constipation   . Depression   . Dyspnea   . Fatigue   . Food allergy    celery  . GERD (gastroesophageal reflux disease)   . Hip pain   . HLD (hyperlipidemia)   . HTN (hypertension)   . Infertility, female   . Lactose intolerance   . Leg edema   . Lower  back pain   . OSA on CPAP   . Palpitations   . Prediabetes   . Shoulder pain   . SOB (shortness of breath)   . Stomach ulcer   . Varicose vein of leg   . Wheezing 12/11/2021   Past Surgical History:  Past Surgical History:  Procedure Laterality Date  . cervix removal N/A   . ENDOMETRIAL ABLATION  2007   prior to hysterectomy  . TOTAL ABDOMINAL HYSTERECTOMY      Social History:  reports that Patty A. Altergott has been smoking cigarettes. Marea A. Leonhart has a 2.50 pack-year smoking history. Manda A. Malson has never used smokeless tobacco. Petina A. Naclerio reports current alcohol use. Lasha A. Privott reports that Carla A. Neece does not use drugs. Family History: family history includes Anxiety disorder in Chalisa A. Derden's father; Breast cancer in [place]. Roads's maternal aunt, maternal grandmother, mother, and paternal grandmother; Cancer in [place]. Gascoigne's maternal aunt, maternal grandmother, mother, paternal grandfather, and sister; Depression in [place]. Bhat's father and mother; Diabetes in [place]. Kochel's father; Heart disease (age of onset: 63) in [place]. Warsame's father; Hyperlipidemia in [place]. Douse's father; Hypertension in [place]. Taffe's father and mother; Kidney disease in [place]. Rusk mother; Obesity in [place]. Pflum's father and mother; Sleep apnea in Chantae A. Hensen's father; Sudden death in  Santoria A. Telleria's brother; Thyroid disease in Kianah A. Pound's sister.   HOME MEDICATIONS: Allergies as of 01/15/2023       Reactions   Food Anaphylaxis, Other (See Comments)   Pt is allergic to celery.    Aleve [naproxen] Other (See Comments)   Pt states that this medication makes her loopy.         Medication List        Accurate as of January 15, 2023  8:33 AM. If you have any questions, ask your nurse or doctor.          albuterol 108 (90 Base) MCG/ACT inhaler Commonly known as: VENTOLIN HFA Inhale 2 puffs into the lungs every 2 (two) hours as needed for wheezing  or shortness of breath. (max of 12 puffs per day)   albuterol (2.5 MG/3ML) 0.083% nebulizer solution Commonly known as: PROVENTIL Inhale 1 vial via nebulizer every 6 (six) hours as needed for wheezing or shortness of breath.   buPROPion 150 MG 12 hr tablet Commonly known as: WELLBUTRIN SR Take 1 tablet (150 mg total) by mouth daily for 3 days, then 1 tablet twice daily continuously.   carvedilol 25 MG tablet Commonly known as: COREG Take 1 tablet (25 mg total) by mouth 2 (two) times daily with a meal.   furosemide 20 MG tablet Commonly known as: LASIX Take daily only as needed for fluid retention   hydrALAZINE 25 MG tablet Commonly known as: APRESOLINE Take 1 tablet  by mouth 2  times daily. Take 1 tablet at midday if BP>140/80   losartan 100 MG tablet Commonly known as: COZAAR Take 1 tablet (100 mg total) by mouth daily.   pantoprazole 40 MG tablet Commonly known as: PROTONIX TAKE 1 TABLET (40 MG TOTAL) BY MOUTH DAILY.   rosuvastatin 10 MG tablet Commonly known as: CRESTOR TAKE 1 TABLET BY MOUTH ONCE DAILY   spironolactone 50 MG tablet Commonly known as: Aldactone Take 1 tablet (50 mg total) by mouth daily.   Symbicort 80-4.5 MCG/ACT inhaler Generic drug: budesonide-formoterol Inhale 2 puffs into the lungs 2 (two) times daily. Rinse mouth after each use   Zepbound 5 MG/0.5ML Pen Generic drug: tirzepatide Inject 5 mg into the skin once a week.          REVIEW OF SYSTEMS: A comprehensive ROS was conducted with the patient and is negative except as per HPI    OBJECTIVE:  VS: BP 120/80 (BP Location: Right Arm, Patient Position: Sitting, Cuff Size: Large)   Pulse 63   Ht 5\' 6"  (1.676 m)   Wt (!) 306 lb (138.8 kg)   SpO2 99%   BMI 49.39 kg/m    Wt Readings from Last 3 Encounters:  01/15/23 (!) 306 lb (138.8 kg)  01/14/23 (!) 305 lb 3.2 oz (138.4 kg)  06/22/22 (!) 325 lb 12.8 oz (147.8 kg)     EXAM: General: Pt appears well and is in NAD Pt noted  with thick white hair  and dark hair at the chin and upper lip area  Neck: General: Supple without adenopathy. Thyroid: Thyroid size normal.  No goiter or nodules appreciated.   Lungs: Clear with good BS bilat with no rales, rhonchi, or wheezes  Heart: Auscultation: RRR.  Abdomen: Normoactive bowel sounds, soft, nontender, without masses or organomegaly palpable Pale abdominal striae   Extremities:  BL LE: Trace pretibial edema normal   Mental Status: Judgment, insight: Intact Orientation: Oriented to time, place, and person Mood and affect: No  depression, anxiety, or agitation     DATA REVIEWED:    Latest Reference Range & Units 01/14/23 09:54  Sodium 135 - 145 mEq/L 145  Potassium 3.5 - 5.1 mEq/L 4.4  Chloride 96 - 112 mEq/L 105  CO2 19 - 32 mEq/L 30  Glucose 70 - 99 mg/dL 109 (H)  BUN 6 - 23 mg/dL 18  Creatinine 0.40 - 1.20 mg/dL 1.10  Calcium 8.4 - 10.5 mg/dL 9.3  Alkaline Phosphatase 39 - 117 U/L 53  Albumin 3.5 - 5.2 g/dL 4.2  AST 0 - 37 U/L 14  ALT 0 - 35 U/L 20  Total Protein 6.0 - 8.3 g/dL 6.7  Total Bilirubin 0.2 - 1.2 mg/dL 0.3  GFR >60.00 mL/min 61.23  (H): Data is abnormally high      Latest Reference Range & Units 04/04/22 07:52  ALDOSTERONE  ng/dL 15  ALDO / PRA Ratio 0.9 - 28.9 Ratio 5.1  Cortisol, Plasma ug/dL 16.1  Metanephrine, Pl <=57 pg/mL 28  Normetanephrine, Pl <=148 pg/mL 72    CT Chest 12/11/2021 IMPRESSION: 1. No evidence of pulmonary embolism or other acute intrathoracic abnormality. 2. Indeterminate 18 mm right adrenal nodule, incompletely evaluated on this examination but statistically likely to reflect a benign adrenal adenoma. Consider further evaluation with nonemergent adrenal protocol CT with and without contrast.   ASSESSMENT/PLAN/RECOMMENDATIONS:   Right adrenal Adenoma :   - Eighty five percent of adrenal adenomas are nonsecretory.  - Three forms of adrenal hyperfunction should be considered in patients with adrenal  incidentaloma  Glucocorticoid hypersecretion Primary hyperaldosteronism Pheochromocytoma  - She had already been tested  for hyper aldo and pheochromocytoma with normal results  - Will proceed with 1 mg dexamethasone test , pt will return for fasting cortisol check  - Will consider adrenal imaging by next visit      F/U in 7 months   Signed electronically by: Mack Guise, MD  [facility] Endocrinology  [facility] [address] [place], [place] [address] Phone: [phone] FAX: [phone]   CC: Flossie Buffy, NP [place] [address] Phone: [phone] Fax: [phone]   Return to Endocrinology clinic as below: No future appointments.

## 2023-01-15 NOTE — Patient Instructions (Signed)
Instructions for Dexamethasone Suppression Test   Step 1: Choose a morning when you can come to our lab at 8:00 am for a blood draw.   Step 2: On the night before the blood draw, take one 1 mg tablet of dexamethasone at 11:30 pm.  The timing is VERY important!   Step 3: The next morning, go to the lab for blood work at 8:00 am.  Amber Bast do not have to be on an empty stomach, but the timing is VERY important!

## 2023-01-16 ENCOUNTER — Other Ambulatory Visit (INDEPENDENT_AMBULATORY_CARE_PROVIDER_SITE_OTHER): Payer: 59

## 2023-01-16 DIAGNOSIS — E278 Other specified disorders of adrenal gland: Secondary | ICD-10-CM

## 2023-01-16 LAB — CORTISOL: Cortisol, Plasma: 0.6 ug/dL

## 2023-01-17 ENCOUNTER — Other Ambulatory Visit: Payer: Self-pay

## 2023-01-21 ENCOUNTER — Other Ambulatory Visit (HOSPITAL_COMMUNITY): Payer: Self-pay

## 2023-01-22 LAB — ALDOSTERONE + RENIN ACTIVITY W/ RATIO
Aldos/Renin Ratio: 14.2 (ref 0.0–30.0)
Aldosterone: 15.2 ng/dL (ref 0.0–30.0)
Renin Activity, Plasma: 1.074 ng/mL/hr (ref 0.167–5.380)

## 2023-01-28 IMAGING — CT CT HEAD W/O CM
4 series · 17 of 47 positions shown, 19 images · non-contrast
Comparison: February 24, 2017.

CLINICAL DATA: A 45-year-old female presents with uncontrolled
hypertension and headaches.

EXAM:
CT HEAD WITHOUT CONTRAST
TECHNIQUE: Contiguous axial images were obtained from the base of the skull
through the vertex without intravenous contrast.

[Series 2: head bone · axial · 0.43mm/px · z∈[-84,-30]mm · 4 of 78 slices shown]
[im 8/78  bone]
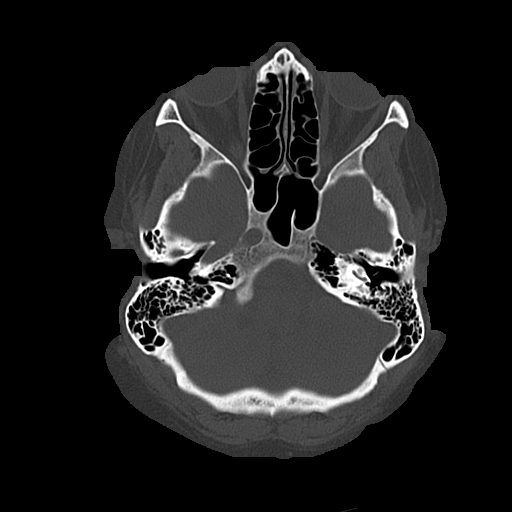
[im 16/78  bone]
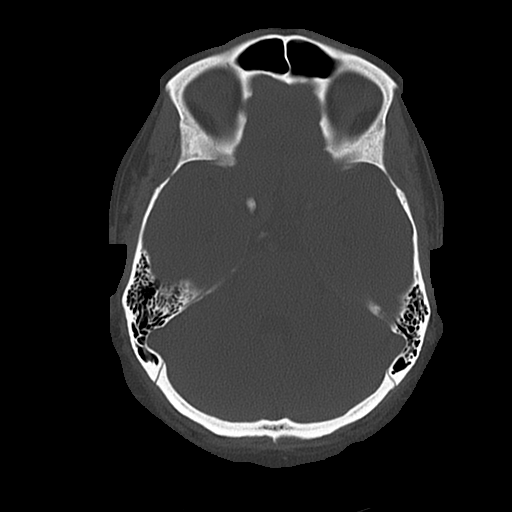
[im 24/78  bone]
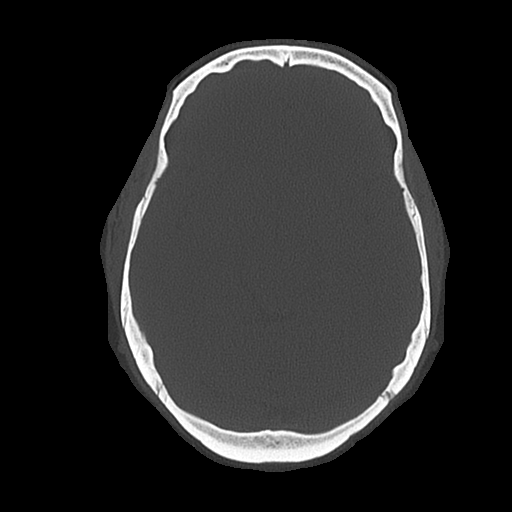
[im 35/78  bone]
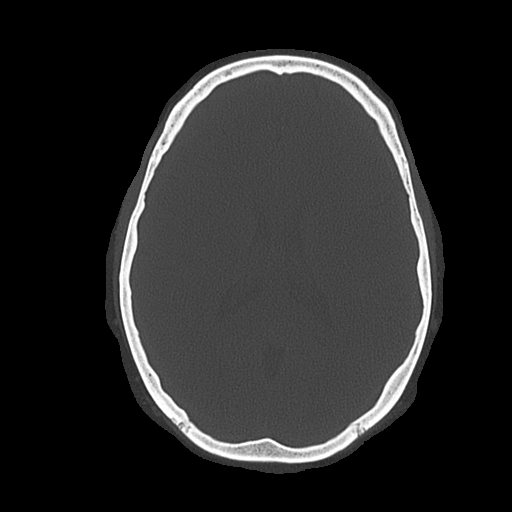

[Series 3: head wo · axial · 0.43mm/px · z∈[-83,+32]mm · 7 of 31 slices shown, 9 images]
[im 4/31  brain]
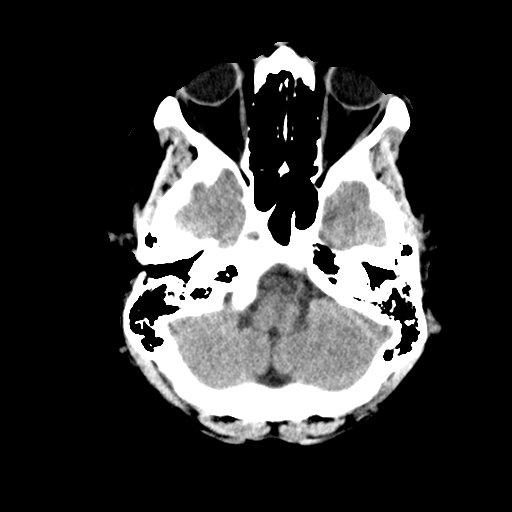
[im 4/31  bone]
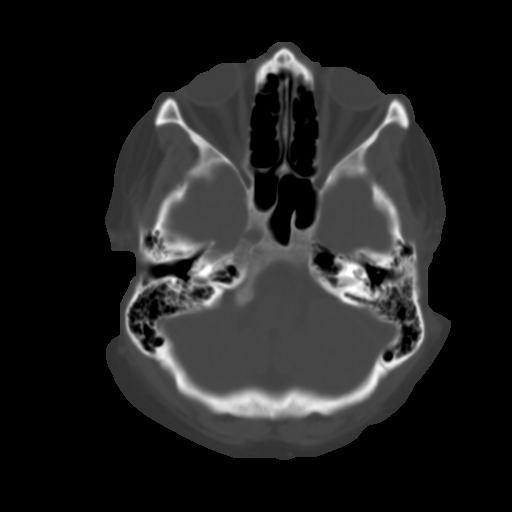
[im 8/31  brain]
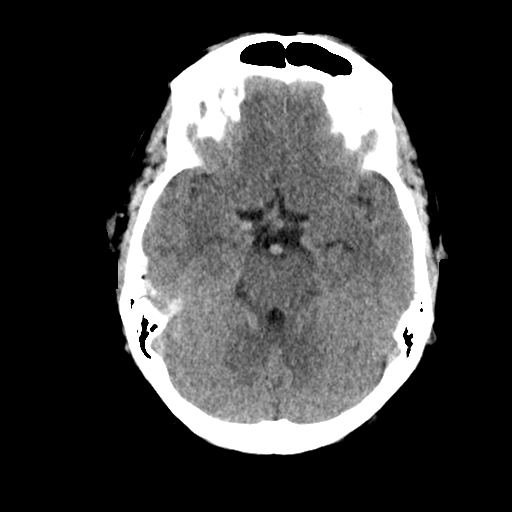
[im 12/31  brain]
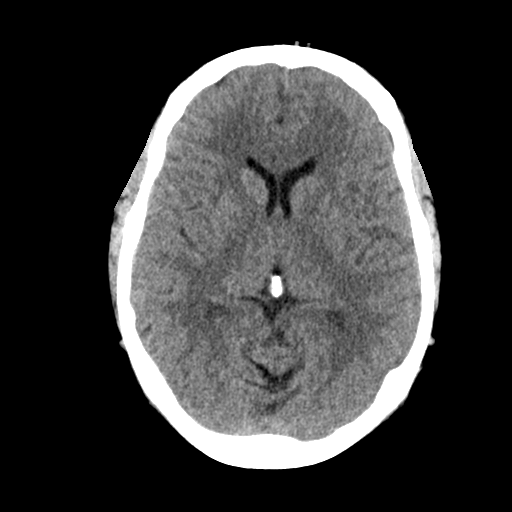
[im 16/31  brain]
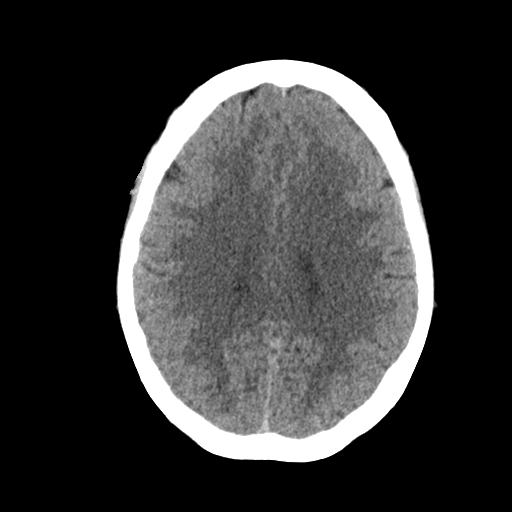
[im 19/31  brain]
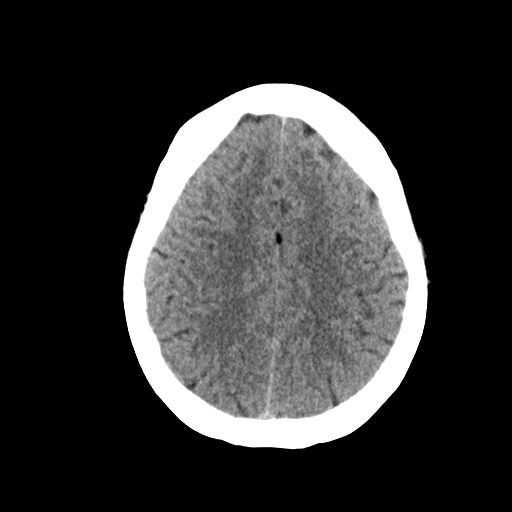
[im 19/31  bone]
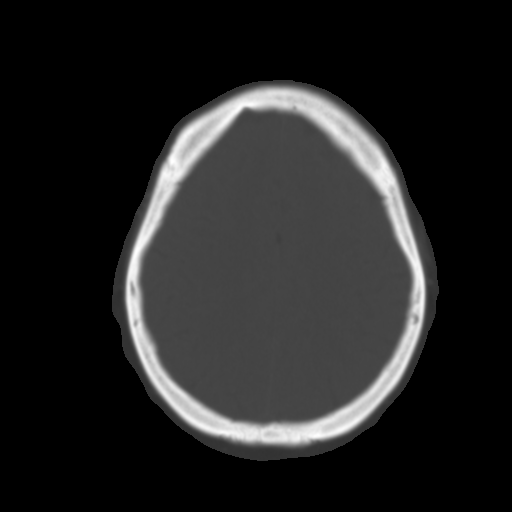
[im 23/31  brain]
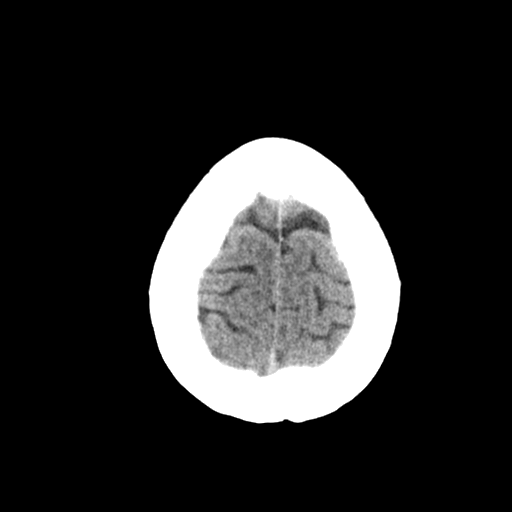
[im 27/31  brain]
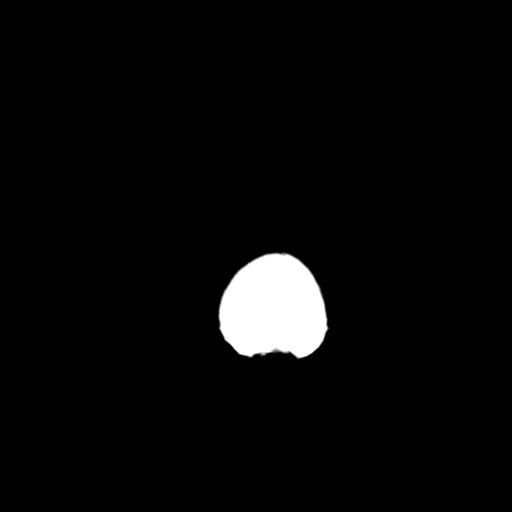

[Series 4: coronal soft · coronal · 0.29mm/px · 3 of 62 slices shown]
[im 21/62  brain]
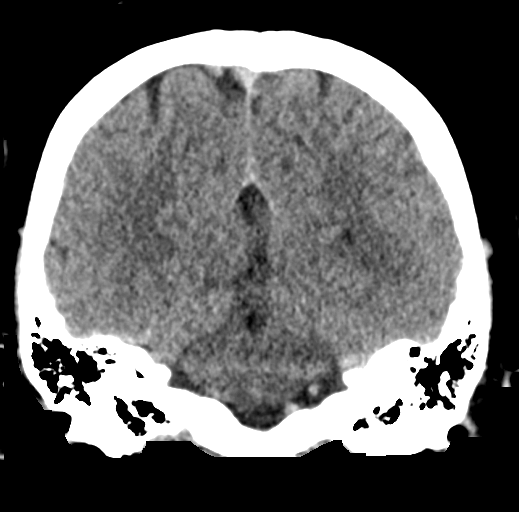
[im 28/62  brain]
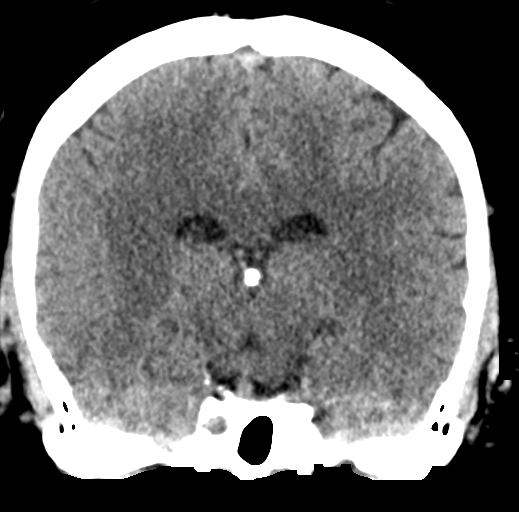
[im 34/62  brain]
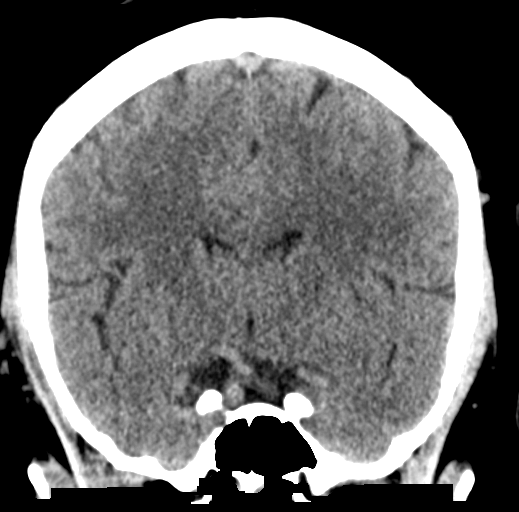

[Series 5: sagittal soft · sagittal · 0.29mm/px · 3 of 50 slices shown]
[im 17/50  brain]
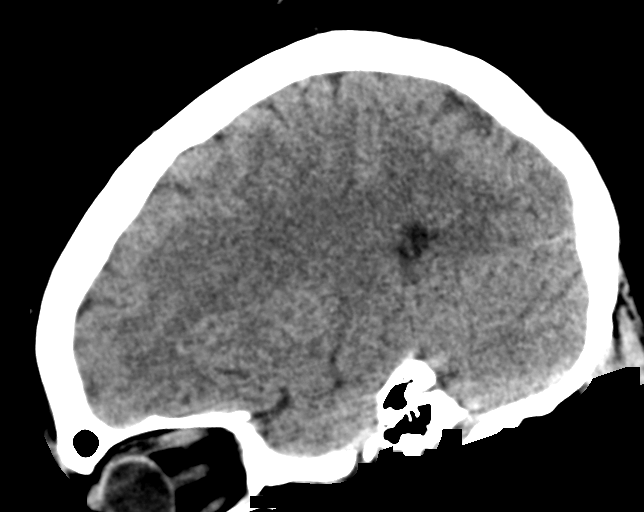
[im 25/50  brain]
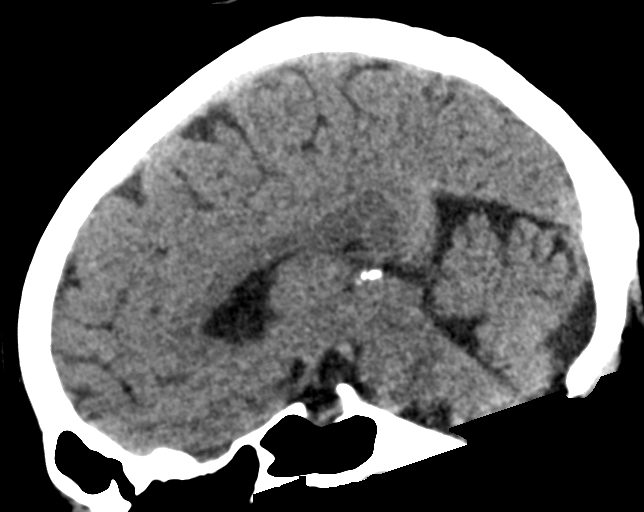
[im 33/50  brain]
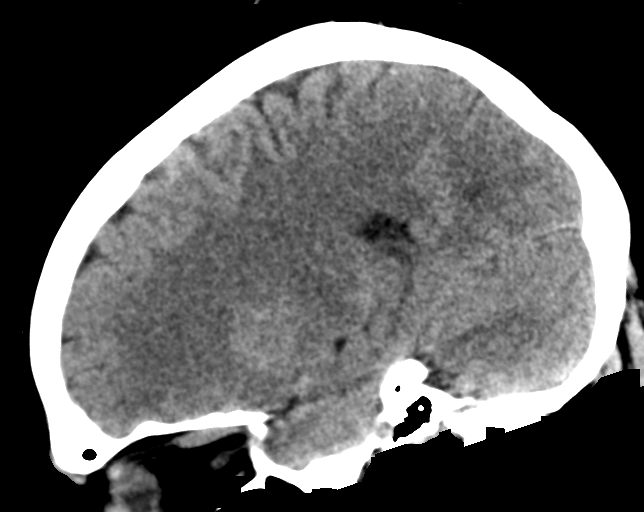

[17 of 47 positions shown; findings below may reference images not displayed]

FINDINGS: Brain: No evidence of acute infarction, hemorrhage, hydrocephalus,
extra-axial collection or mass lesion/mass effect.

Vascular: No hyperdense vessel or unexpected calcification.

Skull: Normal. Negative for fracture or focal lesion.

Sinuses/Orbits: Visualized paranasal sinuses and orbits are
unremarkable.

Other: None
IMPRESSION: No acute intracranial abnormality.

## 2023-01-29 ENCOUNTER — Encounter: Payer: Self-pay | Admitting: Nurse Practitioner

## 2023-02-04 ENCOUNTER — Other Ambulatory Visit (HOSPITAL_COMMUNITY): Payer: Self-pay

## 2023-02-04 ENCOUNTER — Telehealth: Payer: Self-pay

## 2023-02-04 NOTE — Telephone Encounter (Signed)
Pharmacy Patient Advocate Encounter   Received notification from [facility] that prior authorization for Zepbound 5MG/0.5ML pen-injectors is required/requested.  Per Test Claim: PA required   PA submitted on 02/04/23 to (ins) [employer] via CoverMyMeds Key [id number] Status is pending

## 2023-02-05 ENCOUNTER — Other Ambulatory Visit (HOSPITAL_COMMUNITY): Payer: Self-pay

## 2023-02-06 ENCOUNTER — Ambulatory Visit
Admission: RE | Admit: 2023-02-06 | Discharge: 2023-02-06 | Disposition: A | Discharge status: Home / Self Care | Payer: 59 | Source: Ambulatory Visit | Attending: Internal Medicine | Admitting: Internal Medicine

## 2023-02-06 DIAGNOSIS — E278 Other specified disorders of adrenal gland: Secondary | ICD-10-CM

## 2023-02-06 DIAGNOSIS — E279 Disorder of adrenal gland, unspecified: Secondary | ICD-10-CM | POA: Diagnosis not present

## 2023-02-06 MED ORDER — GADOPICLENOL 0.5 MMOL/ML IV SOLN
10.0000 mL | Freq: Once | INTRAVENOUS | Status: AC | PRN
  Administered 2023-02-06: 10 mL via INTRAVENOUS

## 2023-02-06 NOTE — Telephone Encounter (Signed)
Patient Advocate Encounter  Prior Authorization for Zepbound 5MG/0.5ML pen-injectors has been approved.    PA#  [id number] Effective dates: 02/06/23 through 08/05/23

## 2023-02-22 ENCOUNTER — Other Ambulatory Visit (HOSPITAL_COMMUNITY): Payer: Self-pay

## 2023-02-25 ENCOUNTER — Encounter: Payer: Self-pay | Admitting: Cardiovascular Disease

## 2023-02-25 ENCOUNTER — Other Ambulatory Visit: Payer: Self-pay

## 2023-02-28 ENCOUNTER — Other Ambulatory Visit (HOSPITAL_COMMUNITY): Payer: Self-pay

## 2023-03-01 ENCOUNTER — Other Ambulatory Visit (HOSPITAL_COMMUNITY): Payer: Self-pay

## 2023-03-05 ENCOUNTER — Other Ambulatory Visit: Payer: Self-pay | Admitting: Cardiovascular Disease

## 2023-03-05 ENCOUNTER — Other Ambulatory Visit (HOSPITAL_COMMUNITY): Payer: Self-pay

## 2023-03-14 ENCOUNTER — Other Ambulatory Visit (HOSPITAL_COMMUNITY): Payer: Self-pay

## 2023-03-31 ENCOUNTER — Other Ambulatory Visit: Payer: Self-pay | Admitting: Nurse Practitioner

## 2023-03-31 ENCOUNTER — Other Ambulatory Visit (HOSPITAL_COMMUNITY): Payer: Self-pay

## 2023-03-31 ENCOUNTER — Other Ambulatory Visit: Payer: Self-pay | Admitting: Cardiovascular Disease

## 2023-03-31 DIAGNOSIS — I110 Hypertensive heart disease with heart failure: Secondary | ICD-10-CM

## 2023-03-31 DIAGNOSIS — K219 Gastro-esophageal reflux disease without esophagitis: Secondary | ICD-10-CM

## 2023-04-02 ENCOUNTER — Other Ambulatory Visit: Payer: Self-pay

## 2023-04-02 MED ORDER — PANTOPRAZOLE SODIUM 40 MG PO TBEC
40.0000 mg | DELAYED_RELEASE_TABLET | Freq: Every day | ORAL | 1 refills | Status: DC
  Filled 2023-04-02: qty 90, 90d supply, fill #0
  Filled 2023-07-23: qty 90, 90d supply, fill #1

## 2023-04-19 ENCOUNTER — Other Ambulatory Visit (HOSPITAL_COMMUNITY): Payer: Self-pay

## 2023-04-22 ENCOUNTER — Other Ambulatory Visit (HOSPITAL_COMMUNITY): Payer: Self-pay

## 2023-04-30 ENCOUNTER — Other Ambulatory Visit (HOSPITAL_COMMUNITY): Payer: Self-pay

## 2023-05-02 DIAGNOSIS — F419 Anxiety disorder, unspecified: Secondary | ICD-10-CM | POA: Diagnosis not present

## 2023-05-04 ENCOUNTER — Other Ambulatory Visit (HOSPITAL_COMMUNITY): Payer: Self-pay

## 2023-05-09 DIAGNOSIS — F419 Anxiety disorder, unspecified: Secondary | ICD-10-CM | POA: Diagnosis not present

## 2023-05-14 ENCOUNTER — Other Ambulatory Visit (HOSPITAL_COMMUNITY): Payer: Self-pay

## 2023-05-16 ENCOUNTER — Other Ambulatory Visit (HOSPITAL_COMMUNITY): Payer: Self-pay

## 2023-05-20 ENCOUNTER — Emergency Department (HOSPITAL_COMMUNITY)
Admission: EM | Admit: 2023-05-20 | Discharge: 2023-05-20 | Disposition: A | Discharge status: Home / Self Care | Payer: 59 | Attending: Student | Admitting: Student

## 2023-05-20 ENCOUNTER — Other Ambulatory Visit: Payer: Self-pay

## 2023-05-20 ENCOUNTER — Encounter (HOSPITAL_COMMUNITY): Payer: Self-pay

## 2023-05-20 ENCOUNTER — Encounter: Payer: Self-pay | Admitting: Cardiovascular Disease

## 2023-05-20 DIAGNOSIS — I5041 Acute combined systolic (congestive) and diastolic (congestive) heart failure: Secondary | ICD-10-CM | POA: Diagnosis not present

## 2023-05-20 DIAGNOSIS — Z79899 Other long term (current) drug therapy: Secondary | ICD-10-CM | POA: Diagnosis not present

## 2023-05-20 DIAGNOSIS — I159 Secondary hypertension, unspecified: Secondary | ICD-10-CM | POA: Diagnosis not present

## 2023-05-20 DIAGNOSIS — I10 Essential (primary) hypertension: Secondary | ICD-10-CM | POA: Diagnosis not present

## 2023-05-20 LAB — COMPREHENSIVE METABOLIC PANEL
ALT: 26 U/L (ref 0–44)
AST: 28 U/L (ref 15–41)
Albumin: 3.9 g/dL (ref 3.5–5.0)
Alkaline Phosphatase: 53 U/L (ref 38–126)
Anion gap: 9 (ref 5–15)
BUN: 17 mg/dL (ref 6–20)
CO2: 26 mmol/L (ref 22–32)
Calcium: 8.9 mg/dL (ref 8.9–10.3)
Chloride: 104 mmol/L (ref 98–111)
Creatinine, Ser: 1.02 mg/dL — ABNORMAL HIGH (ref 0.44–1.00)
GFR, Estimated: >60 mL/min (ref >60)
Glucose, Bld: 109 mg/dL — ABNORMAL HIGH (ref 70–99)
Potassium: 4.2 mmol/L (ref 3.5–5.1)
Sodium: 139 mmol/L (ref 135–145)
Total Bilirubin: 1 mg/dL (ref 0.3–1.2)
Total Protein: 7.4 g/dL (ref 6.5–8.1)

## 2023-05-20 LAB — TROPONIN I (HIGH SENSITIVITY): Troponin I (High Sensitivity): 10 ng/L (ref <18)

## 2023-05-20 LAB — CBC WITH DIFFERENTIAL/PLATELET
Abs Immature Granulocytes: 0.04 10*3/uL (ref 0.00–0.07)
Basophils Absolute: 0 10*3/uL (ref 0.0–0.1)
Basophils Relative: 0 %
Eosinophils Absolute: 0.3 10*3/uL (ref 0.0–0.5)
Eosinophils Relative: 3 %
HCT: 49.4 % — ABNORMAL HIGH (ref 36.0–46.0)
Hemoglobin: 14.8 g/dL (ref 12.0–15.0)
Immature Granulocytes: 0 %
Lymphocytes Relative: 25 %
Lymphs Abs: 2.3 10*3/uL (ref 0.7–4.0)
MCH: 25.5 pg — ABNORMAL LOW (ref 26.0–34.0)
MCHC: 30 g/dL (ref 30.0–36.0)
MCV: 85 fL (ref 80.0–100.0)
Monocytes Absolute: 0.6 10*3/uL (ref 0.1–1.0)
Monocytes Relative: 6 %
Neutro Abs: 5.9 10*3/uL (ref 1.7–7.7)
Neutrophils Relative %: 66 %
Platelets: 185 10*3/uL (ref 150–400)
RBC: 5.81 MIL/uL — ABNORMAL HIGH (ref 3.87–5.11)
RDW: 12.7 % (ref 11.5–15.5)
WBC: 9.1 10*3/uL (ref 4.0–10.5)
nRBC: 0 % (ref 0.0–0.2)

## 2023-05-20 LAB — URINALYSIS, ROUTINE W REFLEX MICROSCOPIC
Bilirubin Urine: NEGATIVE
Glucose, UA: NEGATIVE mg/dL
Hgb urine dipstick: NEGATIVE
Ketones, ur: NEGATIVE mg/dL
Leukocytes,Ua: NEGATIVE
Nitrite: NEGATIVE
Protein, ur: NEGATIVE mg/dL
Specific Gravity, Urine: 1.004 — ABNORMAL LOW (ref 1.005–1.030)
pH: 7 (ref 5.0–8.0)

## 2023-05-20 MED ORDER — HYDRALAZINE HCL 20 MG/ML IJ SOLN
10.0000 mg | Freq: Once | INTRAMUSCULAR | Status: DC
  Filled 2023-05-20: qty 1

## 2023-05-20 NOTE — ED Triage Notes (Signed)
Patient was sent over from cardiology for evaluation of hypertension. Pt reports being diagnosed with HTN and CHF since 2018, takes her medications daily. Reports that her pressures have been over 200's diastolic. Pt complains of headache.

## 2023-05-20 NOTE — ED Notes (Signed)
Spoke with Dr Posey [name] to get parameter for bp. Pt has obtained goal without med. Holding med

## 2023-05-20 NOTE — Telephone Encounter (Signed)
I spoke with the pt.... and we talked about her risks of a BP this high and with her history and she is symptomatic/ headache.... she agrees to going to the ED for assessment.

## 2023-05-20 NOTE — ED Provider Notes (Signed)
[place] EMERGENCY DEPARTMENT AT [facility] Provider Note  CSN: [id number] Arrival date & time: 05/20/23 1331  Chief Complaint(s) Hypertension  HPI Amber Bentley is a 47 y.o. adult with PMH hypertensive cardiomyopathy, CHF with EF 40 to 45%, HTN, HLD, OSA on CPAP who presents emergency department for evaluation of hypertension.  Patient states that approximately 2 days ago she had a severe headache with systolics greater than 200 and reached out to the cardiology office this morning with persistent blood pressures greater than 200 and was instructed to come the emergency department for further evaluation.  Here in the ER she has no symptoms including no headache, chest pain, shortness of breath, abdominal pain, nausea, vomiting or any other systemic symptoms.   Past Medical History Past Medical History:  Diagnosis Date   Acute combined systolic and diastolic congestive heart failure (HCC) 02/21/2017   Acute respiratory failure with hypoxia (HCC) 12/11/2021   Anemia    Anxiety    Cardiomegaly    Chest pain    CHF (congestive heart failure) (HCC)    Congestive heart failure (HCC)    Constipation    Depression    Dyspnea    Fatigue    Food allergy    celery   GERD (gastroesophageal reflux disease)    Hip pain    HLD (hyperlipidemia)    HTN (hypertension)    Infertility, female    Lactose intolerance    Leg edema    Lower back pain    OSA on CPAP    Palpitations    Prediabetes    Shoulder pain    SOB (shortness of breath)    Stomach ulcer    Varicose vein of leg    Wheezing 12/11/2021   Patient Active Problem List   Diagnosis Date Noted   Family history of breast cancer in first degree relative 03/26/2022   Family history of kidney cancer 03/26/2022   Adrenal nodule (HCC) 12/13/2021   Vitamin D deficiency 01/14/2018   Prediabetes 01/14/2018   OSA (obstructive sleep apnea) 07/20/2017   Chronic diastolic CHF (congestive heart failure) (HCC) 03/01/2017    Pulmonary hypertension (HCC) 03/01/2017   Morbid obesity (HCC)    Migraine without status migrainosus, not intractable    Hypertensive cardiomyopathy, 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HCC)    Tobacco abuse 02/21/2017   Depression, recurrent (HCC) 02/21/2017   Home Medication(s) Prior to Admission medications   Medication Sig Start Date End Date Taking? Authorizing Provider  albuterol (PROVENTIL) (2.5 MG/3ML) 0.083% nebulizer solution Inhale 1 vial via nebulizer every 6 (six) hours as needed for wheezing or shortness of breath. 12/21/21   Nche, Bonna Gains, NP  albuterol (VENTOLIN HFA) 108 (90 Base) MCG/ACT inhaler Inhale 2 puffs into the lungs every 2 (two) hours as needed for wheezing or shortness of breath. (max of 12 puffs per day) 12/13/21   Almon Hercules, MD  budesonide-formoterol (SYMBICORT) 80-4.5 MCG/ACT inhaler Inhale 2 puffs into the lungs 2 (two) times daily. Rinse mouth after each use 12/21/21   Nche, Bonna Gains, NP  buPROPion (WELLBUTRIN SR) 150 MG 12 hr tablet Take 1 tablet (150 mg total) by mouth daily for 3 days, then 1 tablet twice daily continuously. 01/14/23   Nche, Bonna Gains, NP  carvedilol (COREG) 25 MG tablet Take 1 tablet (25 mg total) by mouth 2 (two) times daily with a meal. 11/16/21   Alwyn Ren, MD  furosemide (LASIX) 20 MG tablet Take daily only as needed for fluid  retention 06/22/22   Nahser, Deloris Ping, MD  hydrALAZINE (APRESOLINE) 25 MG tablet Take 1 tablet  by mouth 2  times daily. Take 1 tablet at midday if BP>140/80 03/26/22   Nche, Bonna Gains, NP  losartan (COZAAR) 100 MG tablet Take 1 tablet (100 mg total) by mouth daily. 12/05/22   Nche, Bonna Gains, NP  pantoprazole (PROTONIX) 40 MG tablet Take 1 tablet (40 mg total) by mouth daily. 04/02/23   Nahser, Deloris Ping, MD  rosuvastatin (CRESTOR) 10 MG tablet TAKE 1 TABLET BY MOUTH ONCE DAILY 06/28/20   Nahser, Deloris Ping, MD  spironolactone (ALDACTONE) 50 MG tablet Take 1 tablet (50 mg total) by mouth daily. 11/13/22    Nahser, Deloris Ping, MD  tirzepatide (ZEPBOUND) 5 MG/0.5ML Pen Inject 5 mg into the skin once a week. 01/14/23   Nche, Bonna Gains, NP                                                                                                                                    Past Surgical History Past Surgical History:  Procedure Laterality Date   cervix removal N/A    ENDOMETRIAL ABLATION  2007   prior to hysterectomy   TOTAL ABDOMINAL HYSTERECTOMY     Family History Family History  Problem Relation Age of Onset   Hypertension Mother    Cancer Mother        breast cancer   Kidney disease Mother    Depression Mother    Obesity Mother    Breast cancer Mother    Diabetes Father    Heart disease Father 59   Hyperlipidemia Father    Hypertension Father    Depression Father    Anxiety disorder Father    Sleep apnea Father    Obesity Father    Cancer Sister        kidney cancer   Thyroid disease Sister    Sudden death Brother    Cancer Maternal Aunt        breast cancer   Breast cancer Maternal Aunt        in 50's   Cancer Maternal Grandmother        breast   Breast cancer Maternal Grandmother    Breast cancer Paternal Grandmother    Cancer Paternal Grandfather        brain cancer    Social History Social History   Tobacco Use   Smoking status: Some Days    Packs/day: 0.25    Years: 10.00    Additional pack years: 0.00    Total pack years: 2.50    Types: Cigarettes    Last attempt to quit: 12/12/2021    Years since quitting: 1.4   Smokeless tobacco: Never  Vaping Use   Vaping Use: Never used  Substance Use Topics   Alcohol use: Yes    Comment: rarely   Drug use: No   Allergies Food  and Aleve [naproxen]  Review of Systems Review of Systems  All other systems reviewed and are negative.   Physical Exam Vital Signs  I have reviewed the triage vital signs BP (!) 177/96 (BP Location: Left Arm)   Pulse 74   Temp 97.8 F (36.6 C) (Oral)   Resp 19   Ht 5\' 7"   (1.702 m)   Wt 136.1 kg   SpO2 95%   BMI 46.99 kg/m   Physical Exam Constitutional:      General: Amber Bentley is not in acute distress.    Appearance: Normal appearance.  HENT:     Head: Normocephalic and atraumatic.     Nose: No congestion or rhinorrhea.  Eyes:     General:        Right eye: No discharge.        Left eye: No discharge.     Extraocular Movements: Extraocular movements intact.     Pupils: Pupils are equal, round, and reactive to light.  Cardiovascular:     Rate and Rhythm: Normal rate and regular rhythm.     Heart sounds: No murmur heard. Pulmonary:     Effort: No respiratory distress.     Breath sounds: No wheezing or rales.  Abdominal:     General: There is no distension.     Tenderness: There is no abdominal tenderness.  Musculoskeletal:        General: Normal range of motion.     Cervical back: Normal range of motion.  Skin:    General: Skin is warm and dry.  Neurological:     General: No focal deficit present.     Mental Status: Amber Bentley is alert.     ED Results and Treatments Labs (all labs ordered are listed, but only abnormal results are displayed) Labs Reviewed  COMPREHENSIVE METABOLIC PANEL - Abnormal; Notable for the following components:      Result Value   Glucose, Bld 109 (*)    Creatinine, Ser 1.02 (*)    All other components within normal limits  CBC WITH DIFFERENTIAL/PLATELET - Abnormal; Notable for the following components:   RBC 5.81 (*)    HCT 49.4 (*)    MCH 25.5 (*)    All other components within normal limits  URINALYSIS, ROUTINE W REFLEX MICROSCOPIC - Abnormal; Notable for the following components:   Color, Urine COLORLESS (*)    Specific Gravity, Urine 1.004 (*)    All other components within normal limits  TROPONIN I (HIGH SENSITIVITY)  TROPONIN I (HIGH SENSITIVITY)                                                                                                                           Radiology No results found.  Pertinent labs & imaging results that were available during my care of the patient were reviewed by me and considered in my medical decision making (see MDM for details).  Medications Ordered in ED Medications -  No data to display                                                                                                                                    Procedures Procedures  (including critical care time)  Medical Decision Making / ED Course   This patient presents to the ED for concern of hypertension, this involves an extensive number of treatment options, and is a complaint that carries with it a high risk of complications and morbidity.  The differential diagnosis includes hypertensive urgency, hypertensive emergency, CHF exacerbation, failure of outpatient medication regimen  MDM: Patient seen emergency room for evaluation of hypertension.  Physical exam is unremarkable.  Laboratory evaluation with no evidence of endorgan damage.  Urinalysis with no proteinuria.  High-sensitivity troponin is normal.  Patient is asymptomatic here in the emergency department.  Current ER literature advises against aggressive blood pressure control in the emergency department in the absence of symptoms as this is not shown to have clinical benefit and should be managed in the outpatient setting Archie Balboa et al 2023).  Regardless, patient's blood pressure improved without any intervention here in the emergency department with blood pressure at discharge 177/96.  She was encouraged to continue taking her current blood pressure regimen and speak with her cardiologist if any additions need to be made.  I also had a conversation with her about renal artery stenosis as it does not appear that she has had a renal ultrasound and she will talk with her primary care providers about this.  Patient then discharged.    Additional history obtained:  -External records from outside  source obtained and reviewed including: Chart review including previous notes, labs, imaging, consultation notes   Lab Tests: -I ordered, reviewed, and interpreted labs.   The pertinent results include:   Labs Reviewed  COMPREHENSIVE METABOLIC PANEL - Abnormal; Notable for the following components:      Result Value   Glucose, Bld 109 (*)    Creatinine, Ser 1.02 (*)    All other components within normal limits  CBC WITH DIFFERENTIAL/PLATELET - Abnormal; Notable for the following components:   RBC 5.81 (*)    HCT 49.4 (*)    MCH 25.5 (*)    All other components within normal limits  URINALYSIS, ROUTINE W REFLEX MICROSCOPIC - Abnormal; Notable for the following components:   Color, Urine COLORLESS (*)    Specific Gravity, Urine 1.004 (*)    All other components within normal limits  TROPONIN I (HIGH SENSITIVITY)  TROPONIN I (HIGH SENSITIVITY)    Medicines ordered and prescription drug management: Meds ordered this encounter  Medications   DISCONTD: hydrALAZINE (APRESOLINE) injection 10 mg    -I have reviewed the patients home medicines and have made adjustments as needed  Critical interventions none   Cardiac Monitoring: The patient was maintained on a cardiac monitor.  I personally viewed and interpreted the cardiac monitored which  showed an underlying rhythm of: NSR  Social Determinants of Health:  Factors impacting patients care include: none   Reevaluation: After the interventions noted above, I reevaluated the patient and found that they have :improved  Co morbidities that complicate the patient evaluation  Past Medical History:  Diagnosis Date   Acute combined systolic and diastolic congestive heart failure (HCC) 02/21/2017   Acute respiratory failure with hypoxia (HCC) 12/11/2021   Anemia    Anxiety    Cardiomegaly    Chest pain    CHF (congestive heart failure) (HCC)    Congestive heart failure (HCC)    Constipation    Depression    Dyspnea    Fatigue     Food allergy    celery   GERD (gastroesophageal reflux disease)    Hip pain    HLD (hyperlipidemia)    HTN (hypertension)    Infertility, female    Lactose intolerance    Leg edema    Lower back pain    OSA on CPAP    Palpitations    Prediabetes    Shoulder pain    SOB (shortness of breath)    Stomach ulcer    Varicose vein of leg    Wheezing 12/11/2021      Dispostion: I considered admission for this patient, but at this time she does not meet inpatient criteria for admission she is safe for discharge with outpatient follow-up     Final Clinical Impression(s) / ED Diagnoses Final diagnoses:  Secondary hypertension     @PCDICTATION @    Glendora Score, MD 05/20/23 2219

## 2023-05-23 DIAGNOSIS — F419 Anxiety disorder, unspecified: Secondary | ICD-10-CM | POA: Diagnosis not present

## 2023-05-29 ENCOUNTER — Telehealth: Payer: Self-pay | Admitting: Cardiovascular Disease

## 2023-05-29 ENCOUNTER — Other Ambulatory Visit (HOSPITAL_COMMUNITY): Payer: Self-pay

## 2023-05-29 ENCOUNTER — Other Ambulatory Visit: Payer: Self-pay

## 2023-05-29 DIAGNOSIS — I110 Hypertensive heart disease with heart failure: Secondary | ICD-10-CM

## 2023-05-29 MED ORDER — CARVEDILOL 25 MG PO TABS
25.0000 mg | ORAL_TABLET | Freq: Two times a day (BID) | ORAL | 0 refills | Status: DC
  Filled 2023-05-29: qty 60, 30d supply, fill #0

## 2023-05-29 MED ORDER — LOSARTAN POTASSIUM 100 MG PO TABS
100.0000 mg | ORAL_TABLET | Freq: Every day | ORAL | 0 refills | Status: DC
  Filled 2023-05-29: qty 30, 30d supply, fill #0

## 2023-05-29 NOTE — Telephone Encounter (Signed)
*  STAT* If patient is at the pharmacy, call can be transferred to refill team.   1. Which medications need to be refilled? (please list name of each medication and dose if known)  losartan (COZAAR) 100 MG tablet   carvedilol (COREG) 25 MG tablet    2. Which pharmacy/location (including street and city if local pharmacy) is medication to be sent to?   [facility]    3. Do they need a 30 day or 90 day supply? 90   Pt states she spoke to Dr. Elease Hashimoto today and these meds were supposed to be refill today

## 2023-05-29 NOTE — Telephone Encounter (Signed)
Nahser, Deloris Ping, MD  Lars Mage, RN Amber Bentley has run out of her Coreg and Losartan And now has been having very elevated BP Please refill Coreg 25 mg PO BID Losartan 100 mg a day Please set up a follow up appt in several weeks with me, Benjamine Sprague, or S.Weaver or Jari Favre.  Medications sent to pharmacy on file and pt scheduled with Tereso Newcomer on 06/12/23. Message sent to patient via MyChart.

## 2023-05-29 NOTE — Telephone Encounter (Signed)
Pt calling requesting a refill on losartan and carvedilol. Dr. Elease Hashimoto did not prescribe these medication. Would Dr. Elease Hashimoto like to refill these medications? Please address

## 2023-06-10 NOTE — Progress Notes (Signed)
Cardiology Office Note:    Date:  06/12/2023  ID:  [name], DOB Nov 30, 1976, MRN [id number] PCP: Anne Ng, NP  [facility] Providers Cardiologist:  Kristeen Miss, MD       Patient Profile:      (HFpEF) heart failure with preserved ejection fraction  TTE 02/22/17: EF 45-50 TTE 08/29/18: mild focal basal septal LVH, EF 60-65,no RWMA, mild RAE, PASP 38 (mildly increased) Hypertension Hyperlipidemia  Pre-diabetes  FHx of CAD Morbid obesity  OSA  Pulmonary hypertension  Tobacco use       History of Present Illness:   Amber Bentley is a 47 y.o. adult who returns for post ED follow up. They were seen in the ED 05/20/23 with elevated BP (systolic > 200). Creatinine was fairly normal at 1.02. Hgb was normal. One hsTrop was normal. No changes were made with medications. BP improved into the 170s before DC to home.  They are here alone.  They were out of their medications due to insurance changes.  These have all been refilled now.  They have occasional chest discomfort.  This is more with positional changes.  They rarely have it with exertion.  This has been ongoing for years without change.  They have not had significant shortness of breath, orthopnea, syncope, leg edema, palpitations.  Review of Systems  Constitutional: Negative for fever.  Respiratory:  Negative for cough.   Gastrointestinal:  Negative for diarrhea and vomiting.   See HPI    Studies Reviewed:   EKG Interpretation  Date/Time:  Wednesday June 12 2023 08:55:58 EDT Ventricular Rate:  66 PR Interval:  192 QRS Duration: 98 QT Interval:  418 QTC Calculation: 438 R Axis:   65 Text Interpretation: Normal sinus rhythm Normal axis No ST-TW changes Normal ECG  Confirmed by Tereso Newcomer [phone]) on 06/12/2023 8:59:43 AM    Risk Assessment/Calculations:             Physical Exam:   VS:  BP 122/70   Pulse 66   Ht 5\' 7"  (1.702 m)   Wt (!) 325 lb 6.4 oz (147.6 kg)   SpO2 95%   BMI 50.96  kg/m    Wt Readings from Last 3 Encounters:  06/12/23 (!) 325 lb 6.4 oz (147.6 kg)  05/20/23 300 lb (136.1 kg)  01/15/23 (!) 306 lb (138.8 kg)    Constitutional:      Appearance: Healthy appearance. Not in distress.  Neck:     Vascular: No JVR. JVD normal.  Pulmonary:     Breath sounds: Normal breath sounds. No wheezing. No rales.  Cardiovascular:     Normal rate. Regular rhythm.     Murmurs: There is no murmur.  Edema:    Peripheral edema absent.  Abdominal:     Palpations: Abdomen is soft.       ASSESSMENT AND PLAN:   Hypertensive cardiomyopathy, 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HCC) BP is now optimal. They are back on all their meds. Continue carvedilol 25 mg twice daily, furosemide 20 mg daily as needed, hydralazine 25 mg twice daily, losartan 100 mg daily, spironolactone 50 mg daily.  Follow up 6 months.  (HFpEF) heart failure with preserved ejection fraction (HCC) EF 60-65 by echocardiogram in 2019.  NYHA II.  Volume status stable.  Continue spironolactone 50 mg daily, furosemide 20 mg daily as needed.  They are overall well-controlled from a volume standpoint.  If volume becomes a difficult issue in the near future, consider addition of SGLT2 inhibitor.  Follow-up 6 months.  Tobacco abuse Cessation has been recommended.  OSA (obstructive sleep apnea) They use CPAP nightly.  Morbid obesity (HCC) They were doing well with Tirzepatide with significant weight loss.  However, insurance no longer covers this and they have started to gain weight.    Dispo:  Return in about 6 months (around 12/12/2023) for Routine Follow Up, w/ Dr. Elease Hashimoto.  Signed, Tereso Newcomer, PA-C

## 2023-06-12 ENCOUNTER — Ambulatory Visit: Payer: 59 | Attending: Physician Assistant | Admitting: Physician Assistant

## 2023-06-12 ENCOUNTER — Encounter: Payer: Self-pay | Admitting: Physician Assistant

## 2023-06-12 VITALS — BP 122/70 | HR 66 | Ht 67.0 in | Wt 325.4 lb

## 2023-06-12 DIAGNOSIS — I5032 Chronic diastolic (congestive) heart failure: Secondary | ICD-10-CM

## 2023-06-12 DIAGNOSIS — I43 Cardiomyopathy in diseases classified elsewhere: Secondary | ICD-10-CM | POA: Diagnosis not present

## 2023-06-12 DIAGNOSIS — Z720 Tobacco use: Secondary | ICD-10-CM | POA: Diagnosis not present

## 2023-06-12 DIAGNOSIS — G4733 Obstructive sleep apnea (adult) (pediatric): Secondary | ICD-10-CM | POA: Diagnosis not present

## 2023-06-12 DIAGNOSIS — I110 Hypertensive heart disease with heart failure: Secondary | ICD-10-CM | POA: Diagnosis not present

## 2023-06-12 NOTE — Assessment & Plan Note (Signed)
They use CPAP nightly.

## 2023-06-12 NOTE — Patient Instructions (Signed)
Medication Instructions:  Your physician recommends that you continue on your current medications as directed. Please refer to the Current Medication list given to you today. *If you need a refill on your cardiac medications before your next appointment, please call your pharmacy*   Lab Work: None ordered If you have labs (blood work) drawn today and your tests are completely normal, you will receive your results only by: MyChart Message (if you have MyChart) OR A paper copy in the mail If you have any lab test that is abnormal or we need to change your treatment, we will call you to review the results.   Testing/Procedures: None ordered   Follow-Up: At [facility], you and your health needs are our priority.  As part of our continuing mission to provide you with exceptional heart care, we have created designated Provider Care Teams.  These Care Teams include your primary Cardiologist (physician) and Advanced Practice Providers (APPs -  Physician Assistants and Nurse Practitioners) who all work together to provide you with the care you need, when you need it.  We recommend signing up for the patient portal called "MyChart".  Sign up information is provided on this After Visit Summary.  MyChart is used to connect with patients for Virtual Visits (Telemedicine).  Patients are able to view lab/test results, encounter notes, upcoming appointments, etc.  Non-urgent messages can be sent to your provider as well.   To learn more about what you can do with MyChart, go to [email or web address].    Your next appointment:   6 month(s)  Provider:   Philip Nahser, MD    Other Instructions   

## 2023-06-12 NOTE — Assessment & Plan Note (Signed)
BP is now optimal. They are back on all their meds. Continue carvedilol 25 mg twice daily, furosemide 20 mg daily as needed, hydralazine 25 mg twice daily, losartan 100 mg daily, spironolactone 50 mg daily.  Follow up 6 months.

## 2023-06-12 NOTE — Assessment & Plan Note (Signed)
EF 60-65 by echocardiogram in 2019.  NYHA II.  Volume status stable.  Continue spironolactone 50 mg daily, furosemide 20 mg daily as needed.  They are overall well-controlled from a volume standpoint.  If volume becomes a difficult issue in the near future, consider addition of SGLT2 inhibitor.  Follow-up 6 months.

## 2023-06-12 NOTE — Assessment & Plan Note (Signed)
Cessation has been recommended. 

## 2023-06-12 NOTE — Assessment & Plan Note (Signed)
They were doing well with Tirzepatide with significant weight loss.  However, insurance no longer covers this and they have started to gain weight.

## 2023-06-13 DIAGNOSIS — F419 Anxiety disorder, unspecified: Secondary | ICD-10-CM | POA: Diagnosis not present

## 2023-06-17 ENCOUNTER — Other Ambulatory Visit (HOSPITAL_COMMUNITY): Payer: Self-pay

## 2023-06-26 ENCOUNTER — Other Ambulatory Visit: Payer: Self-pay | Admitting: Cardiovascular Disease

## 2023-06-26 ENCOUNTER — Other Ambulatory Visit: Payer: Self-pay

## 2023-06-26 DIAGNOSIS — I110 Hypertensive heart disease with heart failure: Secondary | ICD-10-CM

## 2023-06-26 MED ORDER — LOSARTAN POTASSIUM 100 MG PO TABS
100.0000 mg | ORAL_TABLET | Freq: Every day | ORAL | 3 refills | Status: DC
  Filled 2023-06-26: qty 90, 90d supply, fill #0
  Filled 2023-07-23 – 2023-09-19 (×2): qty 90, 90d supply, fill #1
  Filled 2023-12-18: qty 90, 90d supply, fill #2
  Filled 2024-03-16: qty 90, 90d supply, fill #3

## 2023-06-27 DIAGNOSIS — F419 Anxiety disorder, unspecified: Secondary | ICD-10-CM | POA: Diagnosis not present

## 2023-07-18 DIAGNOSIS — F419 Anxiety disorder, unspecified: Secondary | ICD-10-CM | POA: Diagnosis not present

## 2023-07-23 ENCOUNTER — Other Ambulatory Visit: Payer: Self-pay

## 2023-07-23 ENCOUNTER — Other Ambulatory Visit: Payer: Self-pay | Admitting: Cardiovascular Disease

## 2023-07-23 ENCOUNTER — Other Ambulatory Visit (INDEPENDENT_AMBULATORY_CARE_PROVIDER_SITE_OTHER): Payer: Self-pay | Admitting: Cardiovascular Disease

## 2023-07-23 ENCOUNTER — Other Ambulatory Visit: Payer: Self-pay | Admitting: Nurse Practitioner

## 2023-07-23 ENCOUNTER — Other Ambulatory Visit (HOSPITAL_COMMUNITY): Payer: Self-pay

## 2023-07-23 DIAGNOSIS — I110 Hypertensive heart disease with heart failure: Secondary | ICD-10-CM

## 2023-07-23 MED ORDER — ROSUVASTATIN CALCIUM 10 MG PO TABS
10.0000 mg | ORAL_TABLET | Freq: Every day | ORAL | 3 refills | Status: DC
  Filled 2023-07-23: qty 90, fill #0
  Filled 2023-08-29: qty 90, 90d supply, fill #0
  Filled 2023-11-20: qty 90, 90d supply, fill #1
  Filled 2024-02-18: qty 90, 90d supply, fill #2
  Filled 2024-05-18: qty 90, 90d supply, fill #3

## 2023-07-23 MED ORDER — CARVEDILOL 25 MG PO TABS
25.0000 mg | ORAL_TABLET | Freq: Two times a day (BID) | ORAL | 3 refills | Status: DC
  Filled 2023-07-23: qty 180, 90d supply, fill #0
  Filled 2023-10-14: qty 180, 90d supply, fill #1
  Filled 2024-01-13: qty 180, 90d supply, fill #2
  Filled 2024-04-11: qty 180, 90d supply, fill #3

## 2023-07-23 MED ORDER — FUROSEMIDE 20 MG PO TABS
20.0000 mg | ORAL_TABLET | Freq: Every day | ORAL | 3 refills | Status: AC | PRN
  Filled 2023-07-23: qty 90, 90d supply, fill #0
  Filled 2023-10-31: qty 90, 90d supply, fill #1
  Filled 2024-02-10: qty 90, 90d supply, fill #2
  Filled 2024-05-16: qty 90, 90d supply, fill #3

## 2023-08-01 ENCOUNTER — Telehealth: Payer: Self-pay | Admitting: Pharmacy Technician

## 2023-08-01 DIAGNOSIS — F419 Anxiety disorder, unspecified: Secondary | ICD-10-CM | POA: Diagnosis not present

## 2023-08-01 NOTE — Telephone Encounter (Signed)
Pharmacy Patient Advocate Encounter   Received notification from CoverMyMeds that prior authorization for Zepbound is due for renewal.   Key [id number]   Medication not covered under pharmacy benefit. PA not available.

## 2023-08-15 DIAGNOSIS — F419 Anxiety disorder, unspecified: Secondary | ICD-10-CM | POA: Diagnosis not present

## 2023-08-28 ENCOUNTER — Other Ambulatory Visit: Payer: Self-pay | Admitting: Cardiovascular Disease

## 2023-08-28 ENCOUNTER — Other Ambulatory Visit (HOSPITAL_COMMUNITY): Payer: Self-pay

## 2023-08-28 DIAGNOSIS — I110 Hypertensive heart disease with heart failure: Secondary | ICD-10-CM

## 2023-08-29 ENCOUNTER — Other Ambulatory Visit: Payer: Self-pay

## 2023-08-29 ENCOUNTER — Other Ambulatory Visit (HOSPITAL_COMMUNITY): Payer: Self-pay

## 2023-09-02 ENCOUNTER — Other Ambulatory Visit: Payer: Self-pay

## 2023-09-02 ENCOUNTER — Other Ambulatory Visit (HOSPITAL_COMMUNITY): Payer: Self-pay

## 2023-09-02 ENCOUNTER — Encounter: Payer: Self-pay | Admitting: Pharmacist

## 2023-09-02 MED ORDER — HYDRALAZINE HCL 25 MG PO TABS
25.0000 mg | ORAL_TABLET | Freq: Two times a day (BID) | ORAL | 1 refills | Status: DC
  Filled 2023-09-02: qty 180, 90d supply, fill #0
  Filled 2023-11-25: qty 180, 90d supply, fill #1

## 2023-09-17 ENCOUNTER — Other Ambulatory Visit: Payer: Self-pay | Admitting: Cardiovascular Disease

## 2023-09-18 ENCOUNTER — Other Ambulatory Visit: Payer: Self-pay

## 2023-09-18 ENCOUNTER — Other Ambulatory Visit (HOSPITAL_COMMUNITY): Payer: Self-pay

## 2023-09-18 MED ORDER — SPIRONOLACTONE 50 MG PO TABS
50.0000 mg | ORAL_TABLET | Freq: Every day | ORAL | 2 refills | Status: DC
  Filled 2023-09-18: qty 90, 90d supply, fill #0
  Filled 2023-12-16: qty 90, 90d supply, fill #1
  Filled 2024-03-16: qty 90, 90d supply, fill #2

## 2023-10-14 ENCOUNTER — Other Ambulatory Visit: Payer: Self-pay | Admitting: Cardiovascular Disease

## 2023-10-14 ENCOUNTER — Other Ambulatory Visit: Payer: Self-pay

## 2023-10-14 DIAGNOSIS — K219 Gastro-esophageal reflux disease without esophagitis: Secondary | ICD-10-CM

## 2023-10-15 ENCOUNTER — Other Ambulatory Visit: Payer: Self-pay

## 2023-10-15 ENCOUNTER — Other Ambulatory Visit (HOSPITAL_COMMUNITY): Payer: Self-pay

## 2023-10-15 MED ORDER — PANTOPRAZOLE SODIUM 40 MG PO TBEC
40.0000 mg | DELAYED_RELEASE_TABLET | Freq: Every day | ORAL | 2 refills | Status: DC
  Filled 2023-10-15: qty 90, 90d supply, fill #0
  Filled 2024-01-13: qty 90, 90d supply, fill #1
  Filled 2024-04-11: qty 90, 90d supply, fill #2

## 2023-10-31 ENCOUNTER — Other Ambulatory Visit (HOSPITAL_COMMUNITY): Payer: Self-pay

## 2023-11-26 ENCOUNTER — Ambulatory Visit: Payer: BC Managed Care – PPO | Attending: Cardiovascular Disease | Admitting: Cardiovascular Disease

## 2023-11-26 ENCOUNTER — Encounter: Payer: Self-pay | Admitting: Cardiovascular Disease

## 2023-11-26 VITALS — BP 126/76 | HR 68 | Ht 67.0 in | Wt 341.4 lb

## 2023-11-26 DIAGNOSIS — G4733 Obstructive sleep apnea (adult) (pediatric): Secondary | ICD-10-CM

## 2023-11-26 DIAGNOSIS — I110 Hypertensive heart disease with heart failure: Secondary | ICD-10-CM

## 2023-11-26 DIAGNOSIS — I5032 Chronic diastolic (congestive) heart failure: Secondary | ICD-10-CM

## 2023-11-26 DIAGNOSIS — I43 Cardiomyopathy in diseases classified elsewhere: Secondary | ICD-10-CM

## 2023-11-26 DIAGNOSIS — Z79899 Other long term (current) drug therapy: Secondary | ICD-10-CM | POA: Diagnosis not present

## 2023-11-26 NOTE — Progress Notes (Unsigned)
Cardiology Office Note   Date:  11/26/2023   ID:  Amber Bentley, Amber Bentley Jan 15, 1976, MRN [id number]  PCP:  Amber Ng, NP  Cardiologist:   Kristeen Miss, MD   No chief complaint on file.  Problem list 1. Hypertensive emergency 2. Morbid obesity 3. Pulmonary hypertension-moderate 4. Possible sleep apnea    Amber Bentley is a 47 y.o. adult who presents for follow-up of her recent hospitalization for hypertensive emergency.  Amber Bentley has had high blood pressure for several years but has not been on medications.  I saw her in the [facility] emergency. Room. She has been started on medications. She was in the hospital for 4 days  She seems to be tolerating her medications without any problems.  She had an echocardiogram which revealed mildly depressed left ventricle systolic function with an EF of 45-50%. She has grade 2 diastolic dysfunction.  She has moderate pulmonary hypertension with an estimate PA pressure of 59.  Is avoiding salt. 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  Is a Administrator .  Smokes some .    April 08, 2017:  Amber Bentley is seen as a work in visit today for chest pain . Stopped smoking in March .  Has gained some weight .  Wt. Today is 303 .   Has cluster headaches.   Wt Readings from Last 3 Encounters:  11/26/23 (!) 341 lb 6.4 oz (154.9 kg)  06/12/23 (!) 325 lb 6.4 oz (147.6 kg)  05/20/23 300 lb (136.1 kg)    [name] started having some CP several years ago . Overall had a pretty good weekend - not much in the way of CP. The CP is a heaviness,   Last for hours.   Lasted most of the day yesterday . Seems to related to upper body movement - was not worsened with walking .     Has a significant family hx of CAD   [name], 2018: Amber Bentley is seen  Wt is 333.   Has not been exercising .   Teaches preschool .  Has 2 children - age 53 ( son) and 23 ( daughter)  . Eats poorly,   Knows that she eats the wrong foods.  Blames stress   BP has been stable , no heart failure symptoms  Has cluster headaches.   Developed a MRSA infection on her nose related to her CPAP .   Apr 16, 2018: Amber Bentley is seen today for follow-up of her hypertension and chronic combined systolic and diastolic congestive heart failure.  Her blood pressures is well controlled.  Wt = 311 ( down  from 324 on March, 2019  Has lost 45 lbs total   Has been having some dizziness since she has been losing weight  Lots of orthostatic hypotension   Still has chest pain  - especially if she puls her CPAP off at night   Has stopped smoking   Is exercising .  Walks 4 days a week .   Breakfast - 2 egg omlett with thin slice of cheese on a wrap , fruit  Lunch -  Lean cuisine, yogart, cup of fruit Dinner - 6 oz of lean meat, 2 cups of veggies  [name], 2019:  Has continued to lose weight .  Seen with wife , [name].   Wt is 302 now .    Has mild CP  Still doing well with her diet  Exercising .     Apr 27, 2020:  Amber Bentley is seen today.  She called a week or so ago and had marked hypertension and shortness of breath.  Unfortunately, she is regained weight covid weight .    Her weight today is 343 pounds.  She was very sick several weeks ago.   Thought she had covid. Tested negative twice. Symptoms lingered for 2-3 days .  Did not lose her sense of taste or smell.  We added back  lasix prn.   ,  Increased her coreg.     Was seen by Vin in late April ECG showed TWI   June 17, 2020:  Amber Bentley  is seen today for follow-up visit.  She has a history of congestive heart failure, hypertension, hyperlipidemia, obesity.  She gained a lot of weight since I last saw her. Wt today is 340 lbs. ( down 3 lbs)  She is an emotional eater.  Eats sweats after work.    September 20, 2020:  Amber Bentley is seen today for follow-up visit.  She has a history of obesity, congestive heart failure, hypertension,  hyperlipidemia.  Her weight today is 344 pounds which is up 4 pounds from her last visit. Life is ok Had covid this past summer.  Had been vaccinated.   Was sick for 5 days.   Got monoclonal antibodies.  Is not paying attention to salt intake We discussed using Kdur.   Nov. 2, 2022: Amber Bentley is seen  Wt is 342 Is in a good spot We discussed the difficulties on standing on a good diet.  Blood pressure is up a little bit.  Will increase spironolactone to 50 mg a day.  She will continue to work on a good low-salt low sugar diet.  June 22, 2022 Amber Bentley is seen today  Wt is 325 lbs ( down 17 lbs )  On ozympic  No CP ,  walks with the dogs   Dec.  10, 2024  Amber Bentley is doing well BP is well controlled.  Weight is up some ( she stopped Ozympic )  No cp , some dyspnea     Past Medical History:  Diagnosis Date   Acute combined systolic and diastolic congestive heart failure (HCC) 02/21/2017   Acute respiratory failure with hypoxia (HCC) 12/11/2021   Anemia    Anxiety    Cardiomegaly    Chest pain    CHF (congestive heart failure) (HCC)    Congestive heart failure (HCC)    Constipation    Depression    Dyspnea    Fatigue    Food allergy    celery   GERD (gastroesophageal reflux disease)    Hip pain    HLD (hyperlipidemia)    HTN (hypertension)    Infertility, female    Lactose intolerance    Leg edema    Lower back pain    OSA on CPAP    Palpitations    Prediabetes    Shoulder pain    SOB (shortness of breath)    Stomach ulcer    Varicose vein of leg    Wheezing 12/11/2021    Past Surgical History:  Procedure Laterality Date   cervix removal N/A    ENDOMETRIAL ABLATION  2007   prior to hysterectomy   TOTAL ABDOMINAL HYSTERECTOMY       Current Outpatient Medications  Medication Sig Dispense Refill   albuterol (PROVENTIL) (2.5 MG/3ML) 0.083% nebulizer solution Inhale 1  vial via nebulizer every 6 (six) hours as needed for wheezing or shortness of breath. 150 mL 0    albuterol (VENTOLIN HFA) 108 (90 Base) MCG/ACT inhaler Inhale 2 puffs into the lungs every 2 (two) hours as needed for wheezing or shortness of breath. (max of 12 puffs per day) 8.5 g 0   budesonide-formoterol (SYMBICORT) 80-4.5 MCG/ACT inhaler Inhale 2 puffs into the lungs as needed (when sick).     carvedilol (COREG) 25 MG tablet Take 1 tablet (25 mg total) by mouth 2 (two) times daily with a meal. 180 tablet 3   furosemide (LASIX) 20 MG tablet Take 1 tablet (20 mg total) by mouth daily only as needed for fluid retention. 90 tablet 3   hydrALAZINE (APRESOLINE) 25 MG tablet Take 1 tablet (25 mg total) by mouth 2 (two) times daily. Take 1 tablet at midday if BP>140/80 180 tablet 1   losartan (COZAAR) 100 MG tablet Take 1 tablet (100 mg total) by mouth daily. Must keep follow-up appt for refills 90 tablet 3   pantoprazole (PROTONIX) 40 MG tablet Take 1 tablet (40 mg total) by mouth daily. 90 tablet 2   rosuvastatin (CRESTOR) 10 MG tablet Take 1 tablet (10 mg total) by mouth daily. 90 tablet 3   spironolactone (ALDACTONE) 50 MG tablet Take 1 tablet (50 mg total) by mouth daily. 90 tablet 2   No current facility-administered medications for this visit.        No data to display            Allergies:   Food and Aleve [naproxen]    Social History:  The patient  reports that Aridai has been smoking cigarettes. Stephannie started smoking about 11 years ago. Chalonda has a 2.5 pack-year smoking history. Somaya has never used smokeless tobacco. Khaya reports current alcohol use. Johni reports that Khrista does not use drugs.   Family History:  The patient's family history includes Anxiety disorder in Jadene's father; Breast cancer in Leather's maternal aunt, maternal grandmother, mother, and paternal grandmother; Cancer in Vika's maternal aunt, maternal grandmother, mother, paternal grandfather, and sister; Depression in Laraya's father and mother; Diabetes in Jullisa's father; Heart disease (age of onset: 29) in  Lochlyn's father; Hyperlipidemia in Von's father; Hypertension in Nyeisha's father and mother; Kidney disease in Tecora's mother; Obesity in Tiani's father and mother; Sleep apnea in Ameira's father; Sudden death in Tabby's brother; Thyroid disease in Leslee's sister.    ROS:    Noted in current history, otherwise review of systems is negative.   Physical Exam: Blood pressure 126/76, pulse 68, height 5\' 7"  (1.702 m), weight (!) 341 lb 6.4 oz (154.9 kg), SpO2 92%.       GEN:  Well nourished, well developed in no acute distress HEENT: Normal NECK: No JVD; No carotid bruits LYMPHATICS: No lymphadenopathy CARDIAC: RRR , no murmurs, rubs, gallops RESPIRATORY:  Clear to auscultation without rales, wheezing or rhonchi  ABDOMEN: Soft, non-tender, non-distended MUSCULOSKELETAL:  No edema; No deformity  SKIN: Warm and dry NEUROLOGIC:  Alert and oriented x 3  EKG:     Recent Labs: 05/20/2023: ALT 26; BUN 17; Creatinine, Ser 1.02; Hemoglobin 14.8; Platelets 185; Potassium 4.2; Sodium 139    Lipid Panel    Component Value Date/Time   CHOL 171 01/14/2023 0954   CHOL 167 10/27/2019 0800   TRIG 80.0 01/14/2023 0954   HDL 46.00 01/14/2023 0954   HDL 51 10/27/2019 0800   CHOLHDL 4 01/14/2023 0954   VLDL  16.0 01/14/2023 0954   LDLCALC 109 (H) 01/14/2023 0954   LDLCALC 104 (H) 10/27/2019 0800      Wt Readings from Last 3 Encounters:  11/26/23 (!) 341 lb 6.4 oz (154.9 kg)  06/12/23 (!) 325 lb 6.4 oz (147.6 kg)  05/20/23 300 lb (136.1 kg)      Other studies Reviewed: Additional studies/ records that were reviewed today include: . Review of the above records demonstrates:    ASSESSMENT AND PLAN:  1. Chest pain:    no further CP   2.   Hypertensive emergency:    BP is well controlled.   Continue weight loss efforts   Overally she is doing well     3.  Pulmonary hypertension:  -     4. Morbid obesity:        encouraged more exercise, calorie reduction   5.  Chronic diastolic  congestive heart failure.   Stable .  Cont salt restriction       Current medicines are reviewed at length with the patient today.  The patient does not have concerns regarding medicines.  Labs/ tests ordered today include:   No orders of the defined types were placed in this encounter.    Disposition:   FU in 1 year    Kristeen Miss, MD  11/26/2023 9:31 AM    [facility] [address] [place], [place], [place]  [address] Phone: [phone]; Fax: [phone]

## 2023-11-26 NOTE — Patient Instructions (Signed)
Lab Work: Lipids, ALT, BMET this week If you have labs (blood work) drawn today and your tests are completely normal, you will receive your results only by: MyChart Message (if you have MyChart) OR A paper copy in the mail If you have any lab test that is abnormal or we need to change your treatment, we will call you to review the results.  Follow-Up: At [facility], you and your health needs are our priority.  As part of our continuing mission to provide you with exceptional heart care, we have created designated Provider Care Teams.  These Care Teams include your primary Cardiologist (physician) and Advanced Practice Providers (APPs -  Physician Assistants and Nurse Practitioners) who all work together to provide you with the care you need, when you need it.  Your next appointment:   1 year(s)  Provider:   Tessa Lerner, MD

## 2023-11-27 ENCOUNTER — Other Ambulatory Visit: Payer: Self-pay

## 2023-11-27 ENCOUNTER — Other Ambulatory Visit (HOSPITAL_COMMUNITY): Payer: Self-pay

## 2024-01-16 ENCOUNTER — Ambulatory Visit: Payer: 59 | Admitting: Internal Medicine

## 2024-01-16 ENCOUNTER — Other Ambulatory Visit: Payer: Self-pay

## 2024-01-16 ENCOUNTER — Encounter: Payer: Self-pay | Admitting: Internal Medicine

## 2024-01-16 VITALS — BP 110/68 | HR 64 | Ht 67.0 in | Wt 339.0 lb

## 2024-01-16 DIAGNOSIS — D3500 Benign neoplasm of unspecified adrenal gland: Secondary | ICD-10-CM | POA: Diagnosis not present

## 2024-01-16 MED ORDER — DEXAMETHASONE 1 MG PO TABS
1.0000 mg | ORAL_TABLET | Freq: Once | ORAL | 0 refills | Status: AC
  Filled 2024-01-16: qty 1, 1d supply, fill #0

## 2024-01-16 NOTE — Patient Instructions (Signed)
Instructions for Dexamethasone Suppression Test   Step 1: Choose a morning when you can come to our lab at 8:00 am for a blood draw.   Step 2: On the night before the blood draw, take one 1 mg tablet of dexamethasone at 11:30 pm.  The timing is VERY important!   Step 3: The next morning, go to the lab for blood work at 8:00 am.  Amber Quin do not have to be on an empty stomach, but the timing is VERY important!

## 2024-01-16 NOTE — Progress Notes (Signed)
Name: Amber Bentley  MRN/ DOB: [id number], Jul 29, 1976    Age/ Sex: 48 y.o., adult    PCP: Anne Ng, NP   Reason for Endocrinology Evaluation: Adrenal Nodule      Date of Initial Endocrinology Evaluation: 06/11/2022    HPI:   Amber Bentley is a 48 y.o. adult with a past medical history of OSA, HTN, CHF. The patient presented for initial endocrinology clinic visit on 06/11/2022 for consultative assistance with Adrenal Nodule .   Pt was noted with an indeterminate 1.8 mm right adrenal nodule during evaluation for PE in 11/2021   She had labs done on 04/04/2022 with normal aldosterone at 15 ng/dL, renin 1.30  ng/mL/hr  , Aldo/renin ratio 5.1.  She also had normal plasma metanephrines and normetanephrine's  Serum cortisol was normal at 16.1 UG/DL   Dexamethasone suppression test was normal at 0.7 UG/DL 07/6577   Mother with questionable adrenal adenoma Sister s/p nephrectomy due to renal cell carcinoma  Abdominal MRI 02/06/2023 confirmed the presence of 1.7 cm left adrenal mass, homogenous macroscopic fat signal intensity consistent with benign adrenal lipoma.  1.9 cm right adrenal lesion is also stable in size and shows simple fluid attenuation and lack of contrast-enhancement, consistent with a benign adrenal cyst.   SUBJECTIVE:    Today (01/16/24):Amber Bentley is here for follow-up on adrenal adenoma.  Patient presented to the ED 05/2023 for evaluation of elevated BP. She has been following up with cardiology for CHF and HTN Patient is on multiple antihypertensive medications including spironolactone, losartan, hydralazine, carvedilol, and furosemide  Substantial weight gain- Yes, since being off GLP-1 agonist Severe hypertension- Well controlled today  DM- no  Sudden/ severe headaches-  no Anxiety attacks- no  Cardiac arrhythmias- no Palpitations- no  Fluid retention- no  Hypokalemia- no   Has some nausea but no vomiting  No Heartburn  No  constipation or diarrhea    HISTORY:  Past Medical History:  Past Medical History:  Diagnosis Date   Acute combined systolic and diastolic congestive heart failure (HCC) 02/21/2017   Acute respiratory failure with hypoxia (HCC) 12/11/2021   Anemia    Anxiety    Cardiomegaly    Chest pain    CHF (congestive heart failure) (HCC)    Congestive heart failure (HCC)    Constipation    Depression    Dyspnea    Fatigue    Food allergy    celery   GERD (gastroesophageal reflux disease)    Hip pain    HLD (hyperlipidemia)    HTN (hypertension)    Infertility, female    Lactose intolerance    Leg edema    Lower back pain    OSA on CPAP    Palpitations    Prediabetes    Shoulder pain    SOB (shortness of breath)    Stomach ulcer    Varicose vein of leg    Wheezing 12/11/2021   Past Surgical History:  Past Surgical History:  Procedure Laterality Date   cervix removal N/A    ENDOMETRIAL ABLATION  2007   prior to hysterectomy   TOTAL ABDOMINAL HYSTERECTOMY      Social History:  reports that Beverley has been smoking cigarettes. Timya started smoking about 12 years ago. Jozelynn has a 2.5 pack-year smoking history. Tanique has never used smokeless tobacco. Jamariya reports current alcohol use. Kauri reports that Xylina does not use drugs. Family History: family history includes Anxiety disorder in Jurney's father;  Breast cancer in Jennah's maternal aunt, maternal grandmother, mother, and paternal grandmother; Cancer in Shavaun's maternal aunt, maternal grandmother, mother, paternal grandfather, and sister; Depression in Nasiya's father and mother; Diabetes in Ophie's father; Heart disease (age of onset: 65) in Buffi's father; Hyperlipidemia in Lorette's father; Hypertension in Osie's father and mother; Kidney disease in Rejoice's mother; Obesity in Orella's father and mother; Sleep apnea in Nahima's father; Sudden death in Zayonna's brother; Thyroid disease in Keylah's sister.   HOME MEDICATIONS: Allergies  as of 01/16/2024       Reactions   Food Anaphylaxis, Other (See Comments)   Pt is allergic to celery.    Aleve [naproxen] Other (See Comments)   Pt states that this medication makes her loopy.         Medication List        Accurate as of January 16, 2024  8:27 AM. If you have any questions, ask your nurse or doctor.          albuterol 108 (90 Base) MCG/ACT inhaler Commonly known as: VENTOLIN HFA Inhale 2 puffs into the lungs every 2 (two) hours as needed for wheezing or shortness of breath. (max of 12 puffs per day)   albuterol (2.5 MG/3ML) 0.083% nebulizer solution Commonly known as: PROVENTIL Inhale 1 vial via nebulizer every 6 (six) hours as needed for wheezing or shortness of breath.   budesonide-formoterol 80-4.5 MCG/ACT inhaler Commonly known as: SYMBICORT Inhale 2 puffs into the lungs as needed (when sick).   carvedilol 25 MG tablet Commonly known as: COREG Take 1 tablet (25 mg total) by mouth 2 (two) times daily with a meal.   furosemide 20 MG tablet Commonly known as: LASIX Take 1 tablet (20 mg total) by mouth daily only as needed for fluid retention.   hydrALAZINE 25 MG tablet Commonly known as: APRESOLINE Take 1 tablet (25 mg total) by mouth 2 (two) times daily. Take 1 tablet at midday if BP>140/80   losartan 100 MG tablet Commonly known as: COZAAR Take 1 tablet (100 mg total) by mouth daily. Must keep follow-up appt for refills   pantoprazole 40 MG tablet Commonly known as: PROTONIX Take 1 tablet (40 mg total) by mouth daily.   rosuvastatin 10 MG tablet Commonly known as: CRESTOR Take 1 tablet (10 mg total) by mouth daily.   spironolactone 50 MG tablet Commonly known as: Aldactone Take 1 tablet (50 mg total) by mouth daily.          REVIEW OF SYSTEMS: A comprehensive ROS was conducted with the patient and is negative except as per HPI    OBJECTIVE:  VS: BP 110/68 (BP Location: Left Wrist, Patient Position: Sitting, Cuff Size: Large)    Pulse 64   Ht 5\' 7"  (1.702 m)   Wt (!) 339 lb (153.8 kg)   SpO2 95%   BMI 53.09 kg/m    Wt Readings from Last 3 Encounters:  01/16/24 (!) 339 lb (153.8 kg)  11/26/23 (!) 341 lb 6.4 oz (154.9 kg)  06/12/23 (!) 325 lb 6.4 oz (147.6 kg)     EXAM: General: Pt appears well and is in NAD Pt noted with thick white hair  and dark hair at the chin and upper lip area  Neck: General: Supple without adenopathy. Thyroid: Thyroid size normal.  No goiter or nodules appreciated.   Lungs: Clear with good BS bilat   Heart: Auscultation: RRR.  Extremities:  BL LE: Trace pretibial edema normal   Mental Status: Judgment, insight: Intact  Orientation: Oriented to time, place, and person Mood and affect: No depression, anxiety, or agitation     DATA REVIEWED:    Latest Reference Range & Units 01/16/24 08:55  Potassium 3.5 - 5.3 mmol/L 4.4      Latest Reference Range & Units 01/16/23 07:51  Cortisol, Plasma ug/dL 0.6    Latest Reference Range & Units 01/14/23 09:54  Sodium 135 - 145 mEq/L 145  Potassium 3.5 - 5.1 mEq/L 4.4  Chloride 96 - 112 mEq/L 105  CO2 19 - 32 mEq/L 30  Glucose 70 - 99 mg/dL 161 (H)  BUN 6 - 23 mg/dL 18  Creatinine 0.96 - 0.45 mg/dL 4.09  Calcium 8.4 - 81.1 mg/dL 9.3  Alkaline Phosphatase 39 - 117 U/L 53  Albumin 3.5 - 5.2 g/dL 4.2  AST 0 - 37 U/L 14  ALT 0 - 35 U/L 20  Total Protein 6.0 - 8.3 g/dL 6.7  Total Bilirubin 0.2 - 1.2 mg/dL 0.3  GFR >91.47 mL/min 61.23  (H): Data is abnormally high   Latest Reference Range & Units 04/04/22 07:52  ALDOSTERONE  ng/dL 15  ALDO / PRA Ratio 0.9 - 28.9 Ratio 5.1  Cortisol, Plasma ug/dL 82.9  Metanephrine, Pl <=57 pg/mL 28  Normetanephrine, Pl <=148 pg/mL 72    MR Abdomen 02/06/2023  Adrenals/Urinary Tract: 1.7 cm left adrenal mass remains stable in size, and shows homogeneous macroscopic fat signal intensity consistent with a benign adrenal lipoma. 1.9 cm right adrenal lesion is also stable in size and shows  simple fluid attenuation and lack of contrast enhancement, consistent with a benign adrenal cyst. No renal masses identified. No evidence of hydronephrosis.     ASSESSMENT/PLAN/RECOMMENDATIONS:   Right adrenal Adenoma :   -No clinical evidence of glucocorticoid, aldosterone or catecholamine hypersecretion -Dexamethasone suppression test 12/2022 was appropriate 0.6 UG/DL -Will repeat again this year -Aldo/renin pending, with the understanding that she is on spironolactone -Adrenal imaging shows stability, no further imaging is needed    F/U in 1 year  Signed electronically by: Lyndle Herrlich, MD  [facility]  [facility] [address] [place] [place], [place] [address] Phone: [phone] FAX: [phone]   CC: Anne Ng, NP [address] [place] [address] Phone: [phone] Fax: [phone]   Return to Endocrinology clinic as below: No future appointments.

## 2024-01-17 ENCOUNTER — Encounter: Payer: Self-pay | Admitting: Internal Medicine

## 2024-01-21 ENCOUNTER — Other Ambulatory Visit: Payer: Self-pay | Admitting: Internal Medicine

## 2024-01-21 DIAGNOSIS — D3500 Benign neoplasm of unspecified adrenal gland: Secondary | ICD-10-CM

## 2024-01-21 LAB — ALDOSTERONE + RENIN ACTIVITY W/ RATIO
ALDO / PRA Ratio: 0.3 {ratio} — ABNORMAL LOW (ref 0.9–28.9)
Aldosterone: 12 ng/dL
Renin Activity: 44.68 ng/mL/h — ABNORMAL HIGH (ref 0.25–5.82)

## 2024-01-21 LAB — METANEPHRINES, PLASMA
Metanephrine, Free: 27 pg/mL (ref <=57)
Normetanephrine, Free: 116 pg/mL (ref <=148)
Total Metanephrines-Plasma: 143 pg/mL (ref <=205)

## 2024-01-21 LAB — POTASSIUM: Potassium: 4.4 mmol/L (ref 3.5–5.3)

## 2024-01-23 ENCOUNTER — Other Ambulatory Visit: Payer: 59

## 2024-01-24 ENCOUNTER — Encounter: Payer: Self-pay | Admitting: Internal Medicine

## 2024-02-07 LAB — CORTISOL: Cortisol, Plasma: 1.3 ug/dL — ABNORMAL LOW

## 2024-02-07 LAB — DEXAMETHASONE, BLOOD: Dexamethasone, Serum: 351 ng/dL

## 2024-02-11 ENCOUNTER — Other Ambulatory Visit (HOSPITAL_COMMUNITY): Payer: Self-pay

## 2024-02-22 ENCOUNTER — Other Ambulatory Visit: Payer: Self-pay | Admitting: Cardiovascular Disease

## 2024-02-22 DIAGNOSIS — I110 Hypertensive heart disease with heart failure: Secondary | ICD-10-CM

## 2024-02-24 ENCOUNTER — Other Ambulatory Visit (HOSPITAL_COMMUNITY): Payer: Self-pay

## 2024-02-24 ENCOUNTER — Other Ambulatory Visit: Payer: Self-pay

## 2024-02-24 MED ORDER — HYDRALAZINE HCL 25 MG PO TABS
25.0000 mg | ORAL_TABLET | Freq: Two times a day (BID) | ORAL | 2 refills | Status: AC
  Filled 2024-02-24: qty 180, 90d supply, fill #0
  Filled 2024-05-22: qty 180, 90d supply, fill #1
  Filled 2024-08-19: qty 180, 90d supply, fill #2

## 2024-03-18 ENCOUNTER — Encounter: Payer: Self-pay | Admitting: Nurse Practitioner

## 2024-03-18 ENCOUNTER — Ambulatory Visit (INDEPENDENT_AMBULATORY_CARE_PROVIDER_SITE_OTHER): Admitting: Nurse Practitioner

## 2024-03-18 ENCOUNTER — Other Ambulatory Visit (HOSPITAL_COMMUNITY): Payer: Self-pay

## 2024-03-18 VITALS — BP 130/80 | HR 70 | Temp 97.2°F | Ht 67.0 in | Wt 338.4 lb

## 2024-03-18 DIAGNOSIS — E559 Vitamin D deficiency, unspecified: Secondary | ICD-10-CM

## 2024-03-18 DIAGNOSIS — R1012 Left upper quadrant pain: Secondary | ICD-10-CM | POA: Diagnosis not present

## 2024-03-18 DIAGNOSIS — I110 Hypertensive heart disease with heart failure: Secondary | ICD-10-CM

## 2024-03-18 DIAGNOSIS — R7303 Prediabetes: Secondary | ICD-10-CM

## 2024-03-18 DIAGNOSIS — I43 Cardiomyopathy in diseases classified elsewhere: Secondary | ICD-10-CM

## 2024-03-18 DIAGNOSIS — E1169 Type 2 diabetes mellitus with other specified complication: Secondary | ICD-10-CM | POA: Insufficient documentation

## 2024-03-18 DIAGNOSIS — E7800 Pure hypercholesterolemia, unspecified: Secondary | ICD-10-CM | POA: Diagnosis not present

## 2024-03-18 LAB — COMPREHENSIVE METABOLIC PANEL WITH GFR
ALT: 22 U/L (ref 0–35)
AST: 17 U/L (ref 0–37)
Albumin: 4.4 g/dL (ref 3.5–5.2)
Alkaline Phosphatase: 72 U/L (ref 39–117)
BUN: 16 mg/dL (ref 6–23)
CO2: 30 meq/L (ref 19–32)
Calcium: 9.4 mg/dL (ref 8.4–10.5)
Chloride: 99 meq/L (ref 96–112)
Creatinine, Ser: 0.97 mg/dL (ref 0.40–1.20)
GFR: 70.63 mL/min (ref >60.00)
Glucose, Bld: 142 mg/dL — ABNORMAL HIGH (ref 70–99)
Potassium: 4.3 meq/L (ref 3.5–5.1)
Sodium: 137 meq/L (ref 135–145)
Total Bilirubin: 0.4 mg/dL (ref 0.2–1.2)
Total Protein: 7.2 g/dL (ref 6.0–8.3)

## 2024-03-18 LAB — VITAMIN D 25 HYDROXY (VIT D DEFICIENCY, FRACTURES): VITD: 23.15 ng/mL — ABNORMAL LOW (ref 30.00–100.00)

## 2024-03-18 LAB — HEMOGLOBIN A1C: Hgb A1c MFr Bld: 7.9 % — ABNORMAL HIGH (ref 4.6–6.5)

## 2024-03-18 LAB — LIPASE: Lipase: 50 U/L (ref 11.0–59.0)

## 2024-03-18 LAB — LDL CHOLESTEROL, DIRECT: Direct LDL: 66 mg/dL

## 2024-03-18 MED ORDER — WEGOVY 0.5 MG/0.5ML ~~LOC~~ SOAJ
0.5000 mg | SUBCUTANEOUS | 1 refills | Status: DC
  Filled 2024-03-18 – 2024-03-20 (×3): qty 2, 28d supply, fill #0

## 2024-03-18 NOTE — Patient Instructions (Signed)
 Go to lab [facility] to use tylenol or ibuprofen as needed for pain.

## 2024-03-18 NOTE — Assessment & Plan Note (Signed)
 Repeat hgbA1c

## 2024-03-18 NOTE — Assessment & Plan Note (Addendum)
 Onset 50yr ago, onset with use of ozempic injection, pain was intermittent within first 2days on injection. Pain is not constant despite discontinuation of GLP-1RA injection 2months ago. Describes as spasms. No nausea or constipation (has soft BM 2x/day), no diarrhea, no GERD symptoms MRI ABDOMEN done 01/2023: no acute findings  Suspect musculoskeletal pain Check lipase, CMP today Advised to use tylenol or ibuprofen prn for pain. Also alternate between warm and cold compress as needed F/up in 79month

## 2024-03-18 NOTE — Assessment & Plan Note (Signed)
 BP at goal with furosemide, coreg, hydralazine, losartan, and spironolactone Under the care of cardiology BP Readings from Last 3 Encounters:  03/18/24 130/80  01/16/24 110/68  11/26/23 126/76    Check CMP

## 2024-03-18 NOTE — Assessment & Plan Note (Addendum)
 D/c semaglutide 12/2023 due to lack of insurance coverage. Previous participation in [facility] weight management program 2019-2021: lost total of 13Lbs. Bastien lost total of 38lbs with ozempic x 10months. Exercise: walking daily, 15-60mins each time Diet: low fat, high protein diet Schellenberg has gained 33lbs in last 4months without GLP-1RA injections despite maintain heart healthy diet and daily exercise Wt Readings from Last 3 Encounters:  03/18/24 (!) 338 lb 6.4 oz (153.5 kg)  01/16/24 (!) 339 lb (153.8 kg)  11/26/23 (!) 341 lb 6.4 oz (154.9 kg)    With change in insurance, I Sent wegovy 0.5mg  weekly Check CMP, lipid panel, hgbA1c today F/up in 75month

## 2024-03-18 NOTE — Assessment & Plan Note (Signed)
 Check lipid panel

## 2024-03-18 NOTE — Progress Notes (Signed)
 Established Patient Visit  Patient: Amber Bentley   DOB: [birth year]   47 y.o. Adult  MRN: [id number] Visit Date: 03/18/2024  Subjective:    Chief Complaint  Patient presents with   Abdominal Pain    Left upper abdomen    Abdominal Pain   Hypertensive cardiomyopathy, 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HCC) BP at goal with furosemide, coreg, hydralazine, losartan, and spironolactone Under the care of cardiology BP Readings from Last 3 Encounters:  03/18/24 130/80  01/16/24 110/68  11/26/23 126/76    Check CMP  Morbid obesity (HCC) D/c semaglutide 12/2023 due to lack of insurance coverage. Previous participation in [facility] weight management program 2019-2021: lost total of 13Lbs. Radigan lost total of 38lbs with ozempic x 10months. Exercise: walking daily, 15-61mins each time Diet: low fat, high protein diet Cedillos has gained 33lbs in last 4months without GLP-1RA injections despite maintain heart healthy diet and daily exercise Wt Readings from Last 3 Encounters:  03/18/24 (!) 338 lb 6.4 oz (153.5 kg)  01/16/24 (!) 339 lb (153.8 kg)  11/26/23 (!) 341 lb 6.4 oz (154.9 kg)    With change in insurance, I Sent wegovy 0.5mg  weekly Check CMP, lipid panel, hgbA1c today F/up in 33month  Vitamin D deficiency Repeat vit.D  Prediabetes Repeat hgbA1c  Elevated LDL cholesterol level Check lipid panel  Colicky LUQ abdominal pain Onset 24yr ago, onset with use of ozempic injection, pain was intermittent within first 2days on injection. Pain is not constant despite discontinuation of GLP-1RA injection 2months ago. Describes as spasms. No nausea or constipation (has soft BM 2x/day), no diarrhea, no GERD symptoms MRI ABDOMEN done 01/2023: no acute findings  Suspect musculoskeletal pain Check lipase, CMP today Advised to use tylenol or ibuprofen prn for pain. Also alternate between warm and cold compress as needed F/up in 33month   Reviewed medical, surgical, and social history  today  Medications: Outpatient Medications Prior to Visit  Medication Sig   albuterol (PROVENTIL) (2.5 MG/3ML) 0.083% nebulizer solution Inhale 1 vial via nebulizer every 6 (six) hours as needed for wheezing or shortness of breath.   albuterol (VENTOLIN HFA) 108 (90 Base) MCG/ACT inhaler Inhale 2 puffs into the lungs every 2 (two) hours as needed for wheezing or shortness of breath. (max of 12 puffs per day)   budesonide-formoterol (SYMBICORT) 80-4.5 MCG/ACT inhaler Inhale 2 puffs into the lungs as needed (when sick).   carvedilol (COREG) 25 MG tablet Take 1 tablet (25 mg total) by mouth 2 (two) times daily with a meal.   furosemide (LASIX) 20 MG tablet Take 1 tablet (20 mg total) by mouth daily only as needed for fluid retention.   hydrALAZINE (APRESOLINE) 25 MG tablet Take 1 tablet (25 mg total) by mouth 2 (two) times daily. Take 1 tablet at midday if BP>140/80   losartan (COZAAR) 100 MG tablet Take 1 tablet (100 mg total) by mouth daily. Must keep follow-up appt for refills   pantoprazole (PROTONIX) 40 MG tablet Take 1 tablet (40 mg total) by mouth daily.   rosuvastatin (CRESTOR) 10 MG tablet Take 1 tablet (10 mg total) by mouth daily.   spironolactone (ALDACTONE) 50 MG tablet Take 1 tablet (50 mg total) by mouth daily.   No facility-administered medications prior to visit.   Reviewed past medical and social history.   ROS per HPI above      Objective:  BP 130/80 (BP Location: Right Arm, Patient Position: Sitting)  Pulse 70   Temp (!) 97.2 F (36.2 C) (Temporal)   Ht 5\' 7"  (1.702 m)   Wt (!) 338 lb 6.4 oz (153.5 kg)   SpO2 97%   BMI 53.00 kg/m      Physical Exam Vitals and nursing note reviewed.  Cardiovascular:     Rate and Rhythm: Normal rate and regular rhythm.     Pulses: Normal pulses.     Heart sounds: Normal heart sounds.  Pulmonary:     Effort: Pulmonary effort is normal.     Breath sounds: Normal breath sounds.  Abdominal:     General: Bowel sounds are  normal.     Palpations: Abdomen is soft. There is no mass.     Tenderness: There is abdominal tenderness in the left upper quadrant. There is no right CVA tenderness, left CVA tenderness, guarding or rebound.     Hernia: No hernia is present.  Skin:    General: Skin is warm and dry.     Findings: No erythema or rash.  Neurological:     Mental Status: Tennille is alert and oriented to person, place, and time.     No results found for any visits on 03/18/24.    Assessment & Plan:    Problem List Items Addressed This Visit     Colicky LUQ abdominal pain - Primary   Onset 13yr ago, onset with use of ozempic injection, pain was intermittent within first 2days on injection. Pain is not constant despite discontinuation of GLP-1RA injection 2months ago. Describes as spasms. No nausea or constipation (has soft BM 2x/day), no diarrhea, no GERD symptoms MRI ABDOMEN done 01/2023: no acute findings  Suspect musculoskeletal pain Check lipase, CMP today Advised to use tylenol or ibuprofen prn for pain. Also alternate between warm and cold compress as needed F/up in 68month      Relevant Orders   Lipase   Comprehensive metabolic panel with GFR   Direct LDL   Elevated LDL cholesterol level   Check lipid panel      Relevant Medications   Semaglutide-Weight Management (WEGOVY) 0.5 MG/0.5ML SOAJ   Hypertensive cardiomyopathy, 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HCC)   BP at goal with furosemide, coreg, hydralazine, losartan, and spironolactone Under the care of cardiology BP Readings from Last 3 Encounters:  03/18/24 130/80  01/16/24 110/68  11/26/23 126/76    Check CMP      Relevant Medications   Semaglutide-Weight Management (WEGOVY) 0.5 MG/0.5ML SOAJ   Morbid obesity (HCC)   D/c semaglutide 12/2023 due to lack of insurance coverage. Previous participation in [facility] weight management program 2019-2021: lost total of 13Lbs. Windhorst lost total of 38lbs with ozempic x 10months. Exercise: walking daily,  15-52mins each time Diet: low fat, high protein diet Baggett has gained 33lbs in last 4months without GLP-1RA injections despite maintain heart healthy diet and daily exercise Wt Readings from Last 3 Encounters:  03/18/24 (!) 338 lb 6.4 oz (153.5 kg)  01/16/24 (!) 339 lb (153.8 kg)  11/26/23 (!) 341 lb 6.4 oz (154.9 kg)    With change in insurance, I Sent wegovy 0.5mg  weekly Check CMP, lipid panel, hgbA1c today F/up in 68month      Relevant Medications   Semaglutide-Weight Management (WEGOVY) 0.5 MG/0.5ML SOAJ   Prediabetes   Repeat hgbA1c      Relevant Medications   Semaglutide-Weight Management (WEGOVY) 0.5 MG/0.5ML SOAJ   Other Relevant Orders   Hemoglobin A1c   Vitamin D deficiency   Repeat vit.D  Relevant Orders   VITAMIN D 25 Hydroxy (Vit-D Deficiency, Fractures)   Return in about 4 weeks (around 04/15/2024) for Weight management and LUQ pain.     Alysia Penna, NP

## 2024-03-18 NOTE — Assessment & Plan Note (Signed)
 Repeat vit D

## 2024-03-19 ENCOUNTER — Other Ambulatory Visit (HOSPITAL_COMMUNITY): Payer: Self-pay

## 2024-03-19 ENCOUNTER — Other Ambulatory Visit: Payer: Self-pay

## 2024-03-19 ENCOUNTER — Encounter: Payer: Self-pay | Admitting: Pharmacist

## 2024-03-20 ENCOUNTER — Other Ambulatory Visit: Payer: Self-pay

## 2024-03-20 ENCOUNTER — Other Ambulatory Visit (HOSPITAL_COMMUNITY): Payer: Self-pay

## 2024-03-22 ENCOUNTER — Encounter: Payer: Self-pay | Admitting: Nurse Practitioner

## 2024-03-22 DIAGNOSIS — E1169 Type 2 diabetes mellitus with other specified complication: Secondary | ICD-10-CM

## 2024-03-22 DIAGNOSIS — I272 Pulmonary hypertension, unspecified: Secondary | ICD-10-CM

## 2024-03-23 ENCOUNTER — Other Ambulatory Visit (HOSPITAL_COMMUNITY): Payer: Self-pay

## 2024-03-23 MED ORDER — OZEMPIC (0.25 OR 0.5 MG/DOSE) 2 MG/3ML ~~LOC~~ SOPN
PEN_INJECTOR | SUBCUTANEOUS | 2 refills | Status: DC
  Filled 2024-03-23: qty 3, 28d supply, fill #0
  Filled 2024-04-16 – 2024-04-17 (×2): qty 3, 28d supply, fill #1
  Filled 2024-05-14: qty 3, 28d supply, fill #2

## 2024-04-06 ENCOUNTER — Telehealth: Payer: Self-pay

## 2024-04-06 NOTE — Telephone Encounter (Signed)
 Noted, recommend calling if oxygen  less than 90, otherwise keep scheduled appointment with PCP

## 2024-04-06 NOTE — Telephone Encounter (Signed)
 Copied from CRM [id number]. Topic: Clinical - Medical Advice >> Apr 06, 2024  1:11 PM Amber Bentley wrote: Reason for CRM: PT STATED SHE WAS SICK LAST WEEK BUT SHE IS FEELING BETTER, BUT SHE IS CONCERNED HER OXYGEN   STATS LOW 90'S SHE STATED IT IS NOT EMERGENT, I DID OFFER NURSE TRIAGE SHE SAID SHE WAS FINE, BUT WANTED SOME MEDICAL ADVISE FROM HER NURSE OR PROVIDER.

## 2024-04-06 NOTE — Telephone Encounter (Signed)
 Called patient to see how she is feeling. They stated that they have been sick for a little over a week with what started as a sore throat and now has a cough that is lingering. They also stated that they have been check their oxygen  and it has been in the 90's when asked if it they have gotten a reading less than 90 they stated no. They stated that she checks her oxygen  a couple of time in a hour time frame due to them having congestive heart failure and the results are never less than 90. I advised them to 1.) not check it so their oxygen  to many times in a time frame. Told them to check when they are having signs of SOB and/or difficulty breathing. 2.) to write down the oxygen  readings with what symptoms they are having at time of check. 3.) Advised them to call 911 if breathing becomes a severe issue and/or if their oxygen  readings are consistently less than 90. They also mentioned that they are taking the cough medication that a person with high blood pressure can take. I got patient scheduled to see Soyla Duverney on 04/09/24 at 9:00 AM. I also advised patient that if her numbers are to get less than 90 today to give our office a call to get scheduled for a sooner appointment with Kathrene Parents, NP or one of her colleagues. They thanked me for returning their call and that they are probably being a hypochondriac. I reassured them that it is ok to be concerned about their health and to please give our office a call and/orr 911 if the oxygen  stats are below 90. They again thanked me for calling and stated that they will give our office a call that they will not call 911 unless they really have to. I told them that I will call tomorrow to check in on them.

## 2024-04-06 NOTE — Telephone Encounter (Signed)
 Went to speak to DOD Rheba Cedar, NP and informed her of my call with patient.

## 2024-04-09 ENCOUNTER — Encounter: Payer: Self-pay | Admitting: Nurse Practitioner

## 2024-04-09 ENCOUNTER — Other Ambulatory Visit: Payer: Self-pay

## 2024-04-09 ENCOUNTER — Ambulatory Visit (INDEPENDENT_AMBULATORY_CARE_PROVIDER_SITE_OTHER): Admitting: Nurse Practitioner

## 2024-04-09 ENCOUNTER — Other Ambulatory Visit (HOSPITAL_COMMUNITY): Payer: Self-pay

## 2024-04-09 VITALS — BP 130/72 | HR 68 | Temp 97.1°F | Ht 66.0 in | Wt 332.8 lb

## 2024-04-09 DIAGNOSIS — J209 Acute bronchitis, unspecified: Secondary | ICD-10-CM

## 2024-04-09 MED ORDER — DM-GUAIFENESIN ER 30-600 MG PO TB12: 1.0000 | ORAL_TABLET | Freq: Two times a day (BID) | ORAL | Status: AC | PRN

## 2024-04-09 MED ORDER — PREDNISONE 20 MG PO TABS
ORAL_TABLET | ORAL | 0 refills | Status: AC
  Filled 2024-04-09: qty 5, 4d supply, fill #0

## 2024-04-09 MED ORDER — DOXYCYCLINE HYCLATE 100 MG PO TABS
100.0000 mg | ORAL_TABLET | Freq: Two times a day (BID) | ORAL | 0 refills | Status: AC
  Filled 2024-04-09: qty 14, 7d supply, fill #0

## 2024-04-09 MED ORDER — ALBUTEROL SULFATE HFA 108 (90 BASE) MCG/ACT IN AERS
2.0000 | INHALATION_SPRAY | Freq: Four times a day (QID) | RESPIRATORY_TRACT | 1 refills | Status: AC | PRN
  Filled 2024-04-09: qty 6.7, 30d supply, fill #0
  Filled 2024-04-09: qty 6.7, 25d supply, fill #0

## 2024-04-09 MED ORDER — BENZONATATE 200 MG PO CAPS
200.0000 mg | ORAL_CAPSULE | Freq: Three times a day (TID) | ORAL | 0 refills | Status: DC | PRN
  Filled 2024-04-09: qty 30, 10d supply, fill #0

## 2024-04-09 NOTE — Patient Instructions (Addendum)
 Call office if no improvement in 1week Go to ED if O2 sat <80% or worsening symptoms Quit tobacco use

## 2024-04-09 NOTE — Progress Notes (Signed)
 Established Patient Visit  Patient: Amber Bentley   DOB: 04-06-1976   47 y.o. Adult  MRN: [id number] Visit Date: 04/09/2024  Subjective:    Chief Complaint  Patient presents with   Cough    For over a week, non productive dry cough feels wet   Low Oxygen  level   Cough This is a new problem. The current episode started 1 to 4 weeks ago. The problem has been gradually worsening. The problem occurs constantly. The cough is Productive of sputum. Associated symptoms include shortness of breath and wheezing. Pertinent negatives include no chest pain, chills, fever, myalgias, nasal congestion or postnasal drip. The symptoms are aggravated by lying down. Risk factors for lung disease include smoking/tobacco exposure. Amber Bentley has tried a beta-agonist inhaler and OTC cough suppressant for the symptoms. The treatment provided no relief. Amber Bentley's past medical history is significant for bronchitis.   Wt Readings from Last 3 Encounters:  04/09/24 (!) 332 lb 12.8 oz (151 kg)  03/18/24 (!) 338 lb 6.4 oz (153.5 kg)  01/16/24 (!) 339 lb (153.8 kg)    BP Readings from Last 3 Encounters:  04/09/24 130/72  03/18/24 130/80  01/16/24 110/68    Reviewed medical, surgical, and social history today  Medications: Outpatient Medications Prior to Visit  Medication Sig   budesonide -formoterol  (SYMBICORT ) 80-4.5 MCG/ACT inhaler Inhale 2 puffs into the lungs as needed (when sick).   carvedilol  (COREG ) 25 MG tablet Take 1 tablet (25 mg total) by mouth 2 (two) times daily with a meal.   furosemide  (LASIX ) 20 MG tablet Take 1 tablet (20 mg total) by mouth daily only as needed for fluid retention.   hydrALAZINE  (APRESOLINE ) 25 MG tablet Take 1 tablet (25 mg total) by mouth 2 (two) times daily. Take 1 tablet at midday if BP>140/80   losartan  (COZAAR ) 100 MG tablet Take 1 tablet (100 mg total) by mouth daily. Must keep follow-up appt for refills   pantoprazole  (PROTONIX ) 40 MG tablet Take 1 tablet (40  mg total) by mouth daily.   rosuvastatin  (CRESTOR ) 10 MG tablet Take 1 tablet (10 mg total) by mouth daily.   Semaglutide ,0.25 or 0.5MG /DOS, (OZEMPIC , 0.25 OR 0.5 MG/DOSE,) 2 MG/3ML SOPN 0.25mg  weekly x 2weeks then 0.5mg  weekly continuously   spironolactone  (ALDACTONE ) 50 MG tablet Take 1 tablet (50 mg total) by mouth daily.   [DISCONTINUED] albuterol  (PROVENTIL ) (2.5 MG/3ML) 0.083% nebulizer solution Inhale 1 vial via nebulizer every 6 (six) hours as needed for wheezing or shortness of breath.   [DISCONTINUED] albuterol  (VENTOLIN  HFA) 108 (90 Base) MCG/ACT inhaler Inhale 2 puffs into the lungs every 2 (two) hours as needed for wheezing or shortness of breath. (max of 12 puffs per day)   No facility-administered medications prior to visit.   Reviewed past medical and social history.   ROS per HPI above      Objective:  BP 130/72 (BP Location: Left Arm, Patient Position: Sitting, Cuff Size: Large)   Pulse 68   Temp (!) 97.1 F (36.2 C) (Temporal)   Ht 5\' 6"  (1.676 m)   Wt (!) 332 lb 12.8 oz (151 kg)   SpO2 96% Comment: with deep breaths  BMI 53.72 kg/m      Physical Exam Vitals and nursing note reviewed.  Constitutional:      Appearance: Amber Bentley is obese.  Cardiovascular:     Rate and Rhythm: Normal rate and regular rhythm.  Pulses: Normal pulses.     Heart sounds: Normal heart sounds.  Pulmonary:     Effort: Pulmonary effort is normal. No respiratory distress.     Breath sounds: Wheezing present. No rales.  Chest:     Chest wall: No tenderness.  Musculoskeletal:     Right lower leg: No edema.     Left lower leg: No edema.  Neurological:     Mental Status: Amber Bentley is alert and oriented to person, place, and time.     No results found for any visits on 04/09/24.    Assessment & Plan:    Problem List Items Addressed This Visit   None Visit Diagnoses       Acute bronchitis, unspecified organism    -  Primary   Relevant Medications   dextromethorphan-guaiFENesin   (MUCINEX  DM) 30-600 MG 12hr tablet   benzonatate  (TESSALON ) 200 MG capsule   predniSONE  (DELTASONE ) 20 MG tablet   doxycycline  (VIBRA -TABS) 100 MG tablet   albuterol  (VENTOLIN  HFA) 108 (90 Base) MCG/ACT inhaler     Call office if no improvement in 1week Go to ED if O2 sat <80% or worsening symptoms Advised to quit tobacco use.  Return if symptoms worsen or fail to improve.     Kathrene Parents, NP

## 2024-04-16 ENCOUNTER — Telehealth: Payer: Self-pay

## 2024-04-16 ENCOUNTER — Other Ambulatory Visit (HOSPITAL_COMMUNITY): Payer: Self-pay

## 2024-04-16 NOTE — Telephone Encounter (Signed)
 Pharmacy Patient Advocate Encounter   Received notification from CoverMyMeds that prior authorization for Ozempic  (0.25 or 0.5 MG/DOSE) 2MG /3ML pen-injectors is required/requested.   Insurance verification completed.   The patient is insured through [facility] .   Per test claim: PA required; PA submitted to above mentioned insurance via CoverMyMeds Key/confirmation #/EOC [id number] Status is pending

## 2024-04-17 NOTE — Telephone Encounter (Signed)
 Pharmacy Patient Advocate Encounter  Received notification from [facility] that Prior Authorization for Ozempic  (0.25 or 0.5 MG/DOSE) 2MG /3ML pen-injectors has been APPROVED from 04/16/2024 to 04/17/2027

## 2024-04-17 NOTE — Telephone Encounter (Signed)
 Called and informed patient that PA for Ozempic  2 mg/3mL pen injectors have been approved and that they may pick up medication from pharmacy.

## 2024-05-14 ENCOUNTER — Other Ambulatory Visit: Payer: Self-pay

## 2024-05-14 ENCOUNTER — Other Ambulatory Visit (HOSPITAL_COMMUNITY): Payer: Self-pay

## 2024-05-16 ENCOUNTER — Other Ambulatory Visit (HOSPITAL_COMMUNITY): Payer: Self-pay

## 2024-05-27 ENCOUNTER — Other Ambulatory Visit (HOSPITAL_COMMUNITY): Payer: Self-pay

## 2024-06-08 ENCOUNTER — Other Ambulatory Visit: Payer: Self-pay | Admitting: Cardiovascular Disease

## 2024-06-09 ENCOUNTER — Other Ambulatory Visit: Payer: Self-pay | Admitting: Cardiovascular Disease

## 2024-06-09 DIAGNOSIS — I110 Hypertensive heart disease with heart failure: Secondary | ICD-10-CM

## 2024-06-10 ENCOUNTER — Other Ambulatory Visit (HOSPITAL_COMMUNITY): Payer: Self-pay

## 2024-06-10 ENCOUNTER — Other Ambulatory Visit: Payer: Self-pay

## 2024-06-10 MED ORDER — SPIRONOLACTONE 50 MG PO TABS
50.0000 mg | ORAL_TABLET | Freq: Every day | ORAL | 1 refills | Status: AC
  Filled 2024-06-10: qty 90, 90d supply, fill #0
  Filled 2024-09-05: qty 90, 90d supply, fill #1

## 2024-06-11 ENCOUNTER — Other Ambulatory Visit (HOSPITAL_COMMUNITY): Payer: Self-pay

## 2024-06-11 MED ORDER — LOSARTAN POTASSIUM 100 MG PO TABS
100.0000 mg | ORAL_TABLET | Freq: Every day | ORAL | 0 refills | Status: DC
Start: 2024-06-11 — End: 2024-07-09
  Filled 2024-06-11: qty 30, 30d supply, fill #0

## 2024-06-12 ENCOUNTER — Other Ambulatory Visit: Payer: Self-pay

## 2024-07-06 ENCOUNTER — Other Ambulatory Visit: Payer: Self-pay

## 2024-07-06 ENCOUNTER — Other Ambulatory Visit (HOSPITAL_COMMUNITY): Payer: Self-pay

## 2024-07-06 ENCOUNTER — Other Ambulatory Visit: Payer: Self-pay | Admitting: *Deleted

## 2024-07-06 DIAGNOSIS — K219 Gastro-esophageal reflux disease without esophagitis: Secondary | ICD-10-CM

## 2024-07-06 MED ORDER — CARVEDILOL 25 MG PO TABS
25.0000 mg | ORAL_TABLET | Freq: Two times a day (BID) | ORAL | 3 refills | Status: AC
  Filled 2024-07-06: qty 180, 90d supply, fill #0
  Filled 2024-10-04: qty 180, 90d supply, fill #1
  Filled 2025-01-02: qty 180, 90d supply, fill #2

## 2024-07-06 MED ORDER — PANTOPRAZOLE SODIUM 40 MG PO TBEC
40.0000 mg | DELAYED_RELEASE_TABLET | Freq: Every day | ORAL | 2 refills | Status: AC
  Filled 2024-07-06: qty 90, 90d supply, fill #0
  Filled 2024-08-18 – 2024-10-04 (×2): qty 90, 90d supply, fill #1
  Filled 2025-01-02: qty 90, 90d supply, fill #2

## 2024-07-09 ENCOUNTER — Other Ambulatory Visit: Payer: Self-pay | Admitting: Physician Assistant

## 2024-07-09 DIAGNOSIS — I110 Hypertensive heart disease with heart failure: Secondary | ICD-10-CM

## 2024-07-10 ENCOUNTER — Other Ambulatory Visit (HOSPITAL_COMMUNITY): Payer: Self-pay

## 2024-07-10 ENCOUNTER — Other Ambulatory Visit: Payer: Self-pay

## 2024-07-10 MED ORDER — LOSARTAN POTASSIUM 100 MG PO TABS
100.0000 mg | ORAL_TABLET | Freq: Every day | ORAL | 1 refills | Status: DC
  Filled 2024-07-10: qty 90, 90d supply, fill #0
  Filled 2024-09-17: qty 90, 90d supply, fill #1

## 2024-07-14 ENCOUNTER — Encounter: Payer: Self-pay | Admitting: Nurse Practitioner

## 2024-07-14 NOTE — Telephone Encounter (Signed)
 Called and patient is scheduled to see Dr. Berneta on 07/17/24 at 11:00 AM

## 2024-07-17 ENCOUNTER — Ambulatory Visit: Admitting: Family Medicine

## 2024-07-17 ENCOUNTER — Other Ambulatory Visit (HOSPITAL_COMMUNITY): Payer: Self-pay

## 2024-07-17 ENCOUNTER — Other Ambulatory Visit (HOSPITAL_BASED_OUTPATIENT_CLINIC_OR_DEPARTMENT_OTHER): Payer: Self-pay

## 2024-07-17 ENCOUNTER — Other Ambulatory Visit: Payer: Self-pay

## 2024-07-17 VITALS — BP 130/72 | HR 79 | Temp 97.7°F | Ht 66.0 in | Wt 334.8 lb

## 2024-07-17 DIAGNOSIS — L720 Epidermal cyst: Secondary | ICD-10-CM | POA: Insufficient documentation

## 2024-07-17 MED ORDER — DOXYCYCLINE HYCLATE 100 MG PO TABS
100.0000 mg | ORAL_TABLET | Freq: Two times a day (BID) | ORAL | 0 refills | Status: AC
  Filled 2024-07-17: qty 20, 10d supply, fill #0

## 2024-07-17 NOTE — Progress Notes (Signed)
 Established Patient Office Visit   Subjective:  Patient ID: Amber Bentley, adult    DOB: [birth year]  Age: 48 y.o. MRN: [id number]  Chief Complaint  Patient presents with   Medical Management of Chronic Issues    Swollen lymph node near her vagina painful now for 6 days     HPI Encounter Diagnoses  Name Primary?   Ruptured epithelial inclusion cyst Yes   For evaluation of the above.  There has been some spontaneous drainage from this lesion.  No fevers or chills.  History of cyst in her abdominal area.   Review of Systems  Constitutional: Negative.   HENT: Negative.    Eyes:  Negative for blurred vision, discharge and redness.  Respiratory: Negative.    Cardiovascular: Negative.   Gastrointestinal:  Negative for abdominal pain.  Genitourinary: Negative.   Musculoskeletal: Negative.  Negative for myalgias.  Skin:  Negative for rash.  Neurological:  Negative for tingling, loss of consciousness and weakness.  Endo/Heme/Allergies:  Negative for polydipsia.     Current Outpatient Medications:    albuterol  (VENTOLIN  HFA) 108 (90 Base) MCG/ACT inhaler, Inhale 2 puffs into the lungs every 6 (six) hours as needed., Disp: 6.7 g, Rfl: 1   budesonide -formoterol  (SYMBICORT ) 80-4.5 MCG/ACT inhaler, Inhale 2 puffs into the lungs as needed (when sick)., Disp: , Rfl:    carvedilol  (COREG ) 25 MG tablet, Take 1 tablet (25 mg total) by mouth 2 (two) times daily with a meal., Disp: 180 tablet, Rfl: 3   dextromethorphan-guaiFENesin  (MUCINEX  DM) 30-600 MG 12hr tablet, Take 1 tablet by mouth 2 (two) times daily as needed for cough., Disp: , Rfl:    doxycycline  (VIBRA -TABS) 100 MG tablet, Take 1 tablet (100 mg total) by mouth 2 (two) times daily for 10 days., Disp: 20 tablet, Rfl: 0   furosemide  (LASIX ) 20 MG tablet, Take 1 tablet (20 mg total) by mouth daily only as needed for fluid retention., Disp: 90 tablet, Rfl: 3   hydrALAZINE  (APRESOLINE ) 25 MG tablet, Take 1 tablet (25 mg total) by  mouth 2 (two) times daily. Take 1 tablet at midday if BP>140/80, Disp: 180 tablet, Rfl: 2   losartan  (COZAAR ) 100 MG tablet, Take 1 tablet (100 mg total) by mouth daily., Disp: 90 tablet, Rfl: 1   pantoprazole  (PROTONIX ) 40 MG tablet, Take 1 tablet (40 mg total) by mouth daily., Disp: 90 tablet, Rfl: 2   rosuvastatin  (CRESTOR ) 10 MG tablet, Take 1 tablet (10 mg total) by mouth daily., Disp: 90 tablet, Rfl: 3   Semaglutide ,0.25 or 0.5MG /DOS, (OZEMPIC , 0.25 OR 0.5 MG/DOSE,) 2 MG/3ML SOPN, 0.25mg  weekly x 2weeks then 0.5mg  weekly continuously, Disp: 3 mL, Rfl: 2   spironolactone  (ALDACTONE ) 50 MG tablet, Take 1 tablet (50 mg total) by mouth daily., Disp: 90 tablet, Rfl: 1   benzonatate  (TESSALON ) 200 MG capsule, Take 1 capsule (200 mg total) by mouth 3 (three) times daily as needed. (Patient not taking: Reported on 07/17/2024), Disp: 30 capsule, Rfl: 0   Objective:     BP 130/72 (BP Location: Left Arm, Patient Position: Sitting, Cuff Size: Normal)   Pulse 79   Temp 97.7 F (36.5 C) (Temporal)   Ht 5' 6 (1.676 m)   Wt (!) 334 lb 12.8 oz (151.9 kg)   SpO2 95%   BMI 54.04 kg/m    Physical Exam Exam conducted with a chaperone present.  Constitutional:      General: [name] is not in acute distress.    Appearance: Normal appearance.  [name] is not ill-appearing, toxic-appearing or diaphoretic.  HENT:     Head: Normocephalic and atraumatic.     Right Ear: External ear normal.     Left Ear: External ear normal.  Eyes:     General: No scleral icterus.       Right eye: No discharge.        Left eye: No discharge.     Extraocular Movements: Extraocular movements intact.     Conjunctiva/sclera: Conjunctivae normal.  Pulmonary:     Effort: Pulmonary effort is normal. No respiratory distress.  Genitourinary:    Exam position: Lithotomy position.     Labia:        Right: No rash, tenderness, lesion or injury.        Left: No rash, tenderness, lesion or injury.    Lymphadenopathy:     Lower  Body: No right inguinal adenopathy. No left inguinal adenopathy.  Skin:    General: Skin is warm and dry.  Neurological:     Mental Status: Amber Bentley is alert and oriented to person, place, and time.  Psychiatric:        Mood and Affect: Mood normal.        Behavior: Behavior normal.      No results found for any visits on 07/17/24.    The 10-year ASCVD risk score (Arnett DK, et al., 2019) is: 10.5%    Assessment & Plan:   Ruptured epithelial inclusion cyst -     Doxycycline  Hyclate; Take 1 tablet (100 mg total) by mouth 2 (two) times daily for 10 days.  Dispense: 20 tablet; Refill: 0    Return Please soak in a warm tub of water 3 times daily for 15 minutes and return if not improving.  Information on inclusion cysts were given.  Amber Sim Lent, MD

## 2024-08-06 ENCOUNTER — Other Ambulatory Visit (HOSPITAL_COMMUNITY): Payer: Self-pay

## 2024-08-06 ENCOUNTER — Encounter: Payer: Self-pay | Admitting: Nurse Practitioner

## 2024-08-06 ENCOUNTER — Other Ambulatory Visit: Payer: Self-pay

## 2024-08-06 ENCOUNTER — Encounter: Payer: Self-pay | Admitting: Pharmacist

## 2024-08-06 ENCOUNTER — Ambulatory Visit: Admitting: Nurse Practitioner

## 2024-08-06 VITALS — BP 136/76 | HR 76 | Temp 97.6°F | Ht 67.0 in | Wt 338.8 lb

## 2024-08-06 DIAGNOSIS — L729 Follicular cyst of the skin and subcutaneous tissue, unspecified: Secondary | ICD-10-CM | POA: Diagnosis not present

## 2024-08-06 DIAGNOSIS — E1169 Type 2 diabetes mellitus with other specified complication: Secondary | ICD-10-CM

## 2024-08-06 DIAGNOSIS — L089 Local infection of the skin and subcutaneous tissue, unspecified: Secondary | ICD-10-CM

## 2024-08-06 DIAGNOSIS — Z7985 Long-term (current) use of injectable non-insulin antidiabetic drugs: Secondary | ICD-10-CM

## 2024-08-06 LAB — MICROALBUMIN / CREATININE URINE RATIO
Creatinine,U: 27.4 mg/dL
Microalb Creat Ratio: UNDETERMINED mg/g (ref 0.0–30.0)
Microalb, Ur: <0.7 mg/dL

## 2024-08-06 LAB — POCT GLYCOSYLATED HEMOGLOBIN (HGB A1C): Hemoglobin A1C: 6.4 % — AB (ref 4.0–5.6)

## 2024-08-06 MED ORDER — SEMAGLUTIDE (1 MG/DOSE) 4 MG/3ML ~~LOC~~ SOPN
1.0000 mg | PEN_INJECTOR | SUBCUTANEOUS | 0 refills | Status: AC
  Filled 2024-08-06: qty 9, 84d supply, fill #0

## 2024-08-06 NOTE — Assessment & Plan Note (Signed)
 No adverse effects with ozempic  0.5mg  Repeat hgbA1c at 6.4% improved from 7.9% Denies any impact on diet and weight loss. Diet: low fat/low carb Exercise: inconsistent due to work schedule  Check UACr today Increased ozempic  dose to 1mg  weekly F/up in 3months

## 2024-08-06 NOTE — Patient Instructions (Addendum)
 Increase ozempic  dose to 1mg  weekly Go to lab for urine microalbumin  Complete doxycycline  Continue warm compress 2-3x/day  You have an appointment with [facility] 08/07/2024 at 10:45am [address], [place], [place] [address] Phone: [phone]

## 2024-08-06 NOTE — Progress Notes (Signed)
 Acute Office Visit  Subjective:    Patient ID: Amber Bentley, adult    DOB: 01-20-1976, 48 y.o.   MRN: [id number]  Chief Complaint  Patient presents with   Cyst    Top center of chest-red, warm to touch, hurts when touched 4/10 pain scale  Refill needed for Ozempic     Rash This is a new problem. The current episode started in the past 7 days. The problem has been gradually worsening since onset. The affected locations include the chest. The rash is characterized by pain, redness and swelling. Nefertiti was exposed to nothing. Pertinent negatives include no fatigue or fever. Past treatments include antibiotics (doxycycline  and warm compress x 5days). The treatment provided no relief.  Presence of mid upper chest wall cyst prior to this.  Wt Readings from Last 3 Encounters:  08/06/24 (!) 338 lb 12.8 oz (153.7 kg)  07/17/24 (!) 334 lb 12.8 oz (151.9 kg)  04/09/24 (!) 332 lb 12.8 oz (151 kg)    Outpatient Medications Prior to Visit  Medication Sig   albuterol  (VENTOLIN  HFA) 108 (90 Base) MCG/ACT inhaler Inhale 2 puffs into the lungs every 6 (six) hours as needed.   budesonide -formoterol  (SYMBICORT ) 80-4.5 MCG/ACT inhaler Inhale 2 puffs into the lungs as needed (when sick).   carvedilol  (COREG ) 25 MG tablet Take 1 tablet (25 mg total) by mouth 2 (two) times daily with a meal.   furosemide  (LASIX ) 20 MG tablet Take 1 tablet (20 mg total) by mouth daily only as needed for fluid retention.   hydrALAZINE  (APRESOLINE ) 25 MG tablet Take 1 tablet (25 mg total) by mouth 2 (two) times daily. Take 1 tablet at midday if BP>140/80   losartan  (COZAAR ) 100 MG tablet Take 1 tablet (100 mg total) by mouth daily.   pantoprazole  (PROTONIX ) 40 MG tablet Take 1 tablet (40 mg total) by mouth daily.   rosuvastatin  (CRESTOR ) 10 MG tablet Take 1 tablet (10 mg total) by mouth daily.   spironolactone  (ALDACTONE ) 50 MG tablet Take 1 tablet (50 mg total) by mouth daily.   [DISCONTINUED] Semaglutide ,0.25 or  0.5MG /DOS, (OZEMPIC , 0.25 OR 0.5 MG/DOSE,) 2 MG/3ML SOPN 0.25mg  weekly x 2weeks then 0.5mg  weekly continuously   dextromethorphan-guaiFENesin  (MUCINEX  DM) 30-600 MG 12hr tablet Take 1 tablet by mouth 2 (two) times daily as needed for cough.   [DISCONTINUED] benzonatate  (TESSALON ) 200 MG capsule Take 1 capsule (200 mg total) by mouth 3 (three) times daily as needed. (Patient not taking: Reported on 07/17/2024)   No facility-administered medications prior to visit.    Reviewed past medical and social history.  Review of Systems  Constitutional:  Negative for fatigue and fever.  Skin:  Positive for rash.   Per HPI     Objective:    Physical Exam Vitals and nursing note reviewed.  Constitutional:      General: Jasamine is not in acute distress. Cardiovascular:     Rate and Rhythm: Normal rate.     Pulses: Normal pulses.  Pulmonary:     Effort: Pulmonary effort is normal.  Chest:     Chest wall: Mass present.       Comments: Golf size abscess on mid upper chest: erythema, fluctuance, tenderness. Lymphadenopathy:     Cervical: No cervical adenopathy.     Upper Body:     Right upper body: No supraclavicular adenopathy.     Left upper body: No supraclavicular adenopathy.  Skin:    Findings: Abscess present.  Neurological:     Mental  Status: Kasidy is alert.    BP 136/76 (BP Location: Left Arm, Patient Position: Sitting, Cuff Size: Large)   Pulse 76   Temp 97.6 F (36.4 C) (Oral)   Ht 5' 7 (1.702 m)   Wt (!) 338 lb 12.8 oz (153.7 kg)   SpO2 97%   BMI 53.06 kg/m    Results for orders placed or performed in visit on 08/06/24  POCT glycosylated hemoglobin (Hb A1C)  Result Value Ref Range   Hemoglobin A1C 6.4 (A) 4.0 - 5.6 %   HbA1c POC (<> result, manual entry)     HbA1c, POC (prediabetic range)     HbA1c, POC (controlled diabetic range)        Assessment & Plan:   Problem List Items Addressed This Visit     DM (diabetes mellitus) (HCC) - Primary   No adverse effects  with ozempic  0.5mg  Repeat hgbA1c at 6.4% improved from 7.9% Denies any impact on diet and weight loss. Diet: low fat/low carb Exercise: inconsistent due to work schedule  Check UACr today Increased ozempic  dose to 1mg  weekly F/up in 3months      Relevant Medications   Semaglutide , 1 MG/DOSE, 4 MG/3ML SOPN   Other Relevant Orders   POCT glycosylated hemoglobin (Hb A1C) (Completed)   Urine microalbumin-creatinine with uACR   Other Visit Diagnoses       Long-term (current) use of injectable non-insulin  antidiabetic drugs       Relevant Medications   Semaglutide , 1 MG/DOSE, 4 MG/3ML SOPN     Infected cyst of skin       Relevant Orders   Ambulatory referral to General Surgery      Meds ordered this encounter  Medications   Semaglutide , 1 MG/DOSE, 4 MG/3ML SOPN    Sig: Inject 1 mg as directed once a week.    Dispense:  9 mL    Refill:  0    Change in dose    Supervising Provider:   BERNETA FALLOW ALFRED [[id number]]   Complete doxycycline  Continue warm compress 2-3x/day She was notified about appointment with [facility] on 08/07/2024 at 10:45am. Provided address at phone number.  Return in about 3 months (around 11/06/2024) for HTN, DM, hyperlipidemia (fasting).    Roselie Mood, NP

## 2024-08-07 ENCOUNTER — Other Ambulatory Visit (HOSPITAL_COMMUNITY): Payer: Self-pay | Admitting: Student

## 2024-08-07 ENCOUNTER — Ambulatory Visit: Payer: Self-pay | Admitting: Nurse Practitioner

## 2024-08-07 DIAGNOSIS — R222 Localized swelling, mass and lump, trunk: Secondary | ICD-10-CM

## 2024-08-07 DIAGNOSIS — L089 Local infection of the skin and subcutaneous tissue, unspecified: Secondary | ICD-10-CM

## 2024-08-07 NOTE — Progress Notes (Signed)
 REFERRING PROVIDER:  Katheen Roselie Rockford, NP  PROVIDER:  PUJA GOSAI MACZIS, PA  MRN: [id number] DOB: Sep 03, 1976 DATE OF ENCOUNTER: 08/07/2024  Subjective   Chief Complaint: New Consultation ( URGENT - chest wall infected cyst-)    History of Present Illness: Amber Bentley is a 48 y.o. female who is presenting per request of Nche, Roselie Rockford, NP for evaulation of an infected sebaceous cyst on her chest wall.  She states there has been a pea-sized cyst present on her central chest for almost all of her life.  However about 1 week ago it became large and painful.  It drained some fluid at the end of last week.  Over the weekend it was very painful and swollen.  She has completed 5 days of antibiotics and believes it has improved some but is still tender to palpation.  She denies fever. She does have Type 2 DM, recent A1c 6.4%.   Review of Systems: A complete review of systems was obtained from the patient.  I have reviewed this information and discussed as appropriate with the patient.  See HPI as well for other ROS.  All other review of systems negative.   Medical History: Past Medical History:  Diagnosis Date  . Anemia   . Anxiety   . CHF (congestive heart failure) (CMS/HHS-HCC)   . Hypertension   . Sleep apnea     There is no problem list on file for this patient.   Past Surgical History:  Procedure Laterality Date  . HYSTERECTOMY       Not on File  Current Outpatient Medications on File Prior to Visit  Medication Sig Dispense Refill  . albuterol  MDI, PROVENTIL , VENTOLIN , PROAIR , HFA 90 mcg/actuation inhaler Inhale 2 inhalations into the lungs every 6 (six) hours as needed    . budesonide -formoteroL  (SYMBICORT ) 80-4.5 mcg/actuation inhaler Inhale 2 inhalations into the lungs    . carvediloL  (COREG ) 25 MG tablet Take 25 mg by mouth    . dexAMETHasone  (DECADRON ) 1 MG tablet     . FUROsemide  (LASIX ) 20 MG tablet Take 20 mg by mouth    . hydrALAZINE  (APRESOLINE ) 25  MG tablet Take 25 mg by mouth    . losartan  (COZAAR ) 100 MG tablet Take 100 mg by mouth once daily    . pantoprazole  (PROTONIX ) 40 MG DR tablet Take 40 mg by mouth once daily    . rosuvastatin  (CRESTOR ) 10 MG tablet Take 10 mg by mouth once daily    . semaglutide  (OZEMPIC ) 1 mg/dose (4 mg/3 mL) pen injector Inject 1 mg as directed    . spironolactone  (ALDACTONE ) 50 MG tablet Take 50 mg by mouth once daily     No current facility-administered medications on file prior to visit.    Family History  Problem Relation Age of Onset  . High blood pressure (Hypertension) Mother   . Hyperlipidemia (Elevated cholesterol) Father   . Coronary Artery Disease (Blocked arteries around heart) Father   . Diabetes Father   . Obesity Father   . Diabetes Sister   . High blood pressure (Hypertension) Sister   . Obesity Sister      Social History   Tobacco Use  Smoking Status Every Day  . Current packs/day: 0.50  . Types: Cigarettes  Smokeless Tobacco Never     Social History   Socioeconomic History  . Marital status: Married  Tobacco Use  . Smoking status: Every Day    Current packs/day: 0.50  Types: Cigarettes  . Smokeless tobacco: Never  Substance and Sexual Activity  . Drug use: Never   Social Drivers of Corporate investment banker Strain: Low Risk  (07/17/2024)   Received from [facility]   Overall Financial Resource Strain (CARDIA)   . How hard is it for you to pay for the very basics like food, housing, medical care, and heating?: Not hard at all  Food Insecurity: No Food Insecurity (07/17/2024)   Received from [facility]   Hunger Vital Sign   . Within the past 12 months, you worried that your food would run out before you got the money to buy more.: Never true   . Within the past 12 months, the food you bought just didn't last and you didn't have money to get more.: Never true  Transportation Needs: No Transportation Needs (07/17/2024)   Received from [facility] -  Transportation   . In the past 12 months, has lack of transportation kept you from medical appointments or from getting medications?: No   . In the past 12 months, has lack of transportation kept you from meetings, work, or from getting things needed for daily living?: No  Physical Activity: Insufficiently Active (07/17/2024)   Received from [facility]   Exercise Vital Sign   . On average, how many days per week do you engage in moderate to strenuous exercise (like a brisk walk)?: 2 days   . On average, how many minutes do you engage in exercise at this level?: 20 min  Stress: No Stress Concern Present (07/17/2024)   Received from [facility] - Occupational Stress Questionnaire   . Do you feel stress - tense, restless, nervous, or anxious, or unable to sleep at night because your mind is troubled all the time - these days?: Only a little  Social Connections: Unknown (07/17/2024)   Received from [facility]   Social Connection and Isolation Panel   . In a typical week, how many times do you talk on the phone with family, friends, or neighbors?: More than three times a week   . How often do you get together with friends or relatives?: Patient declined   . How often do you attend church or religious services?: Patient declined   . Do you belong to any clubs or organizations such as church groups, unions, fraternal or athletic groups, or school groups?: Patient declined   . Are you married, widowed, divorced, separated, never married, or living with a partner?: Married    Objective:   BP 107/71   Pulse 79   Temp 36.1 C (97 F)   Ht 170.2 cm (5' 7)   Wt (!) 152.4 kg (336 lb)   SpO2 96%   BMI 52.63 kg/m  Body mass index is 52.63 kg/m.  General: Well-developed, well-nourished, in no acute distress.   Eyes: No scleral icterus.  Pupils equal, lids normal Respiratory: Normal effort, no use of accessory muscles Musculoskeletal: Normal gait. Grossly  normal ROM upper and lower extremities  Psychiatric: Normal judgement and insight.  Normal mood and affect.  Alert, oriented x 3  Central chest: There is a 5 cm x 3 cm soft tissue mass without overlying erythema.  There is a small area of erythema on the right inferior border of the soft tissue mass.  No active drainage  A chaperone, Philis Seats, CMA, was present during the exam.   Procedure Note:   Incision  and Drainage Procedure Note  Location: Central chest  Diagnosis: Abscess  Anesthesia: 1% lidocaine  with epi  Procedure Details: The pathophysiology of subcutaneous abscess and differential diagnosis was discussed.  Natural history progression was discussed.  The patient's symptoms are not adequately controlled.  Non-operative treatment has not healed the abscess.  Therefore, I recommended incision & drainage of the abscess to allow the infection to resolve and heal.  Technique, risks, benefits, and alternatives discussed.  The patient expressed understanding and signed consent to the procedure.  The skin was sterilely prepped over the affected area.  After adequate local anesthesia, a #11 blade was used to incise the skin at the right inferior border of the soft tissue mass where it was erythematous.  A small to moderate amount of purulent drainage was evacuated.  Hemostats were used to break up loculations. The skin was excised to allow an adequate opening for drainage & prevent skin re-closure.  1/4 iodoform packing was placed in abscess cavity. The patient tolerated the procedure well and there were no immediate complications.   Assessment and Plan:   Diagnoses and all orders for this visit:  Infected sebaceous cyst  Chest wall mass -     US  chest; Future    Lianny A Freilich is presenting to the office with a central soft tissue mass that has enlarged and become painful over the past week.  Dr. Vanderbilt also evaluated the patient and recommended I&D of the right inferior  border.  A small to moderate amount of purulence was evacuated.  I did not see any obvious cyst material, although 1 piece of cyst material may have evacuated.  There was still a mass present at the end of the procedure.  Per Dr. Vanderbilt, ultrasound was ordered to further evaluate this.  Hopefully the ultrasound will be done next week.  We will call her with ultrasound results and the next steps which may include follow-up with the surgeon to discuss elective excision. She will complete the antibiotics prescribed.  She develops any worsening issues in the meantime she will call to be seen sooner.   This patient encounter took moderate decision making and 30 minutes today to perform the following: review records, take history, perform exam, counsel the patient on the diagnosis, and document encounter, findings, and plan in the EHR.  Puja Maczis, [facility]

## 2024-08-10 ENCOUNTER — Other Ambulatory Visit (INDEPENDENT_AMBULATORY_CARE_PROVIDER_SITE_OTHER): Payer: Self-pay

## 2024-08-10 ENCOUNTER — Ambulatory Visit (HOSPITAL_COMMUNITY)
Admission: RE | Admit: 2024-08-10 | Discharge: 2024-08-10 | Disposition: A | Discharge status: Home / Self Care | Source: Ambulatory Visit | Attending: Student | Admitting: Student

## 2024-08-10 DIAGNOSIS — L723 Sebaceous cyst: Secondary | ICD-10-CM | POA: Diagnosis present

## 2024-08-10 DIAGNOSIS — R222 Localized swelling, mass and lump, trunk: Secondary | ICD-10-CM | POA: Insufficient documentation

## 2024-08-10 DIAGNOSIS — L089 Local infection of the skin and subcutaneous tissue, unspecified: Secondary | ICD-10-CM | POA: Insufficient documentation

## 2024-08-13 ENCOUNTER — Other Ambulatory Visit (HOSPITAL_COMMUNITY): Payer: Self-pay

## 2024-08-18 ENCOUNTER — Other Ambulatory Visit (HOSPITAL_COMMUNITY): Payer: Self-pay

## 2024-08-19 ENCOUNTER — Other Ambulatory Visit: Payer: Self-pay

## 2024-08-21 ENCOUNTER — Other Ambulatory Visit (HOSPITAL_COMMUNITY): Payer: Self-pay

## 2024-08-21 ENCOUNTER — Other Ambulatory Visit (INDEPENDENT_AMBULATORY_CARE_PROVIDER_SITE_OTHER): Payer: Self-pay | Admitting: Nurse Practitioner

## 2024-08-21 ENCOUNTER — Other Ambulatory Visit: Payer: Self-pay

## 2024-08-21 MED ORDER — ROSUVASTATIN CALCIUM 10 MG PO TABS
10.0000 mg | ORAL_TABLET | Freq: Every day | ORAL | 0 refills | Status: DC
  Filled 2024-08-21: qty 90, 90d supply, fill #0

## 2024-09-17 ENCOUNTER — Other Ambulatory Visit (HOSPITAL_COMMUNITY): Payer: Self-pay

## 2024-09-17 ENCOUNTER — Other Ambulatory Visit: Payer: Self-pay

## 2024-10-15 ENCOUNTER — Other Ambulatory Visit (HOSPITAL_COMMUNITY): Payer: Self-pay

## 2024-11-10 ENCOUNTER — Other Ambulatory Visit: Payer: Self-pay

## 2024-11-15 ENCOUNTER — Other Ambulatory Visit (INDEPENDENT_AMBULATORY_CARE_PROVIDER_SITE_OTHER): Payer: Self-pay | Admitting: Physician Assistant

## 2024-11-16 ENCOUNTER — Other Ambulatory Visit: Payer: Self-pay

## 2024-11-16 ENCOUNTER — Other Ambulatory Visit (HOSPITAL_COMMUNITY): Payer: Self-pay

## 2024-11-16 MED ORDER — ROSUVASTATIN CALCIUM 10 MG PO TABS
10.0000 mg | ORAL_TABLET | Freq: Every day | ORAL | 0 refills | Status: AC
  Filled 2024-11-16: qty 90, 90d supply, fill #0

## 2024-11-17 ENCOUNTER — Other Ambulatory Visit: Payer: Self-pay

## 2024-12-04 ENCOUNTER — Other Ambulatory Visit: Payer: Self-pay

## 2024-12-13 ENCOUNTER — Other Ambulatory Visit: Payer: Self-pay

## 2025-01-01 ENCOUNTER — Other Ambulatory Visit: Payer: Self-pay | Admitting: Physician Assistant

## 2025-01-01 DIAGNOSIS — I43 Cardiomyopathy in diseases classified elsewhere: Secondary | ICD-10-CM

## 2025-01-03 ENCOUNTER — Other Ambulatory Visit: Payer: Self-pay

## 2025-01-04 ENCOUNTER — Other Ambulatory Visit: Payer: Self-pay

## 2025-01-04 ENCOUNTER — Other Ambulatory Visit (HOSPITAL_COMMUNITY): Payer: Self-pay

## 2025-01-04 MED ORDER — LOSARTAN POTASSIUM 100 MG PO TABS
100.0000 mg | ORAL_TABLET | Freq: Every day | ORAL | 0 refills | Status: AC
  Filled 2025-01-04: qty 30, 30d supply, fill #0

## 2025-01-13 ENCOUNTER — Other Ambulatory Visit: Payer: Self-pay

## 2025-01-13 ENCOUNTER — Encounter: Payer: Self-pay | Admitting: Internal Medicine

## 2025-01-13 ENCOUNTER — Ambulatory Visit: Payer: 59 | Admitting: Internal Medicine

## 2025-01-13 ENCOUNTER — Other Ambulatory Visit (HOSPITAL_COMMUNITY): Payer: Self-pay

## 2025-01-13 VITALS — BP 128/80 | HR 60 | Ht 67.0 in | Wt 345.0 lb

## 2025-01-13 DIAGNOSIS — D3500 Benign neoplasm of unspecified adrenal gland: Secondary | ICD-10-CM | POA: Diagnosis not present

## 2025-01-13 MED ORDER — DEXAMETHASONE 1 MG PO TABS
1.0000 mg | ORAL_TABLET | Freq: Once | ORAL | 0 refills | Status: AC
  Filled 2025-01-13: qty 1, 1d supply, fill #0

## 2025-01-13 NOTE — Progress Notes (Unsigned)
 "   Name: Amber Bentley  MRN/ DOB: [id number], Apr 29, 1976    Age/ Sex: 49 y.o., adult    PCP: Katheen Roselie Rockford, NP   Reason for Endocrinology Evaluation: Adrenal Nodule      Date of Initial Endocrinology Evaluation: 06/11/2022    HPI:   Amber Bentley is a 49 y.o. adult with a past medical history of OSA, HTN, CHF. The patient presented for initial endocrinology clinic visit on 06/11/2022 for consultative assistance with Adrenal Nodule .   Pt was noted with an indeterminate 1.8 mm right adrenal nodule during evaluation for PE in 11/2021   She had labs done on 04/04/2022 with normal aldosterone at 15 ng/dL, renin 7.03  ng/mL/hr  , Aldo/renin ratio 5.1.  She also had normal plasma metanephrines and normetanephrine's  Serum cortisol was normal at 16.1 UG/DL   Dexamethasone  suppression test was normal at 0.7 UG/DL 01/7975   Mother with questionable adrenal adenoma Sister s/p nephrectomy due to renal cell carcinoma  Abdominal MRI 02/06/2023 confirmed the presence of 1.7 cm left adrenal mass, homogenous macroscopic fat signal intensity consistent with benign adrenal lipoma.  1.9 cm right adrenal lesion is also stable in size and shows simple fluid attenuation and lack of contrast-enhancement, consistent with a benign adrenal cyst.   SUBJECTIVE:    Today (01/13/25):Amber Bentley is here for follow-up on adrenal adenoma.    She has been following up with cardiology for CHF and HTN Patient is on multiple antihypertensive medications including spironolactone , losartan , hydralazine , carvedilol , and furosemide   Substantial weight gain- unable to use Ozmpeic  Severe hypertension- well controlled today  DM-latest A1c 6.4% Sudden/ severe headaches-  no Anxiety attacks- no  Cardiac arrhythmias- no Palpitations- no Fluid retention- no  Hypokalemia- no Bedtime is between 11-12 pm  Has occasional constipation  No recent glucose recorded intake   HISTORY:  Past  Medical History:  Past Medical History:  Diagnosis Date   Acute combined systolic and diastolic congestive heart failure (HCC) 02/21/2017   Acute respiratory failure with hypoxia (HCC) 12/11/2021   Anemia    Anxiety    Cardiomegaly    Chest pain    CHF (congestive heart failure) (HCC)    Congestive heart failure (HCC)    Constipation    Depression    Dyspnea    Fatigue    Food allergy    celery   GERD (gastroesophageal reflux disease)    Hip pain    HLD (hyperlipidemia)    HTN (hypertension)    Infertility, female    Lactose intolerance    Leg edema    Lower back pain    OSA on CPAP    Palpitations    Prediabetes    Shoulder pain    SOB (shortness of breath)    Stomach ulcer    Varicose vein of leg    Wheezing 12/11/2021   Past Surgical History:  Past Surgical History:  Procedure Laterality Date   cervix removal N/A    ENDOMETRIAL ABLATION  2007   prior to hysterectomy   TOTAL ABDOMINAL HYSTERECTOMY      Social History:  reports that Itzy has been smoking cigarettes. Frannie started smoking about 13 years ago. Patriciaann has a 2.5 pack-year smoking history. Shaneal has never used smokeless tobacco. Rheagan reports current alcohol use. Candid reports that Nyala does not use drugs. Family History: family history includes Anxiety disorder in Lanaya's father; Breast cancer in Lucindy's maternal aunt, maternal grandmother, mother, and paternal grandmother;  Cancer in Sagrario's maternal aunt, maternal grandmother, mother, paternal grandfather, and sister; Depression in Amyriah's father and mother; Diabetes in Berdell's father; Heart disease (age of onset: 56) in Maniya's father; Hyperlipidemia in Nelani's father; Hypertension in Taimane's father and mother; Kidney disease in Thy's mother; Obesity in Rashika's father and mother; Sleep apnea in Rachele's father; Sudden death in Lisel's brother; Thyroid  disease in Kathleen's sister.   HOME MEDICATIONS: Allergies as of 01/13/2025       Reactions   Food  Anaphylaxis, Other (See Comments)   Pt is allergic to celery.    Aleve [naproxen] Other (See Comments)   Pt states that this medication makes her loopy.         Medication List        Accurate as of January 13, 2025  2:59 PM. If you have any questions, ask your nurse or doctor.          albuterol  108 (90 Base) MCG/ACT inhaler Commonly known as: VENTOLIN  HFA Inhale 2 puffs into the lungs every 6 (six) hours as needed.   budesonide -formoterol  80-4.5 MCG/ACT inhaler Commonly known as: SYMBICORT  Inhale 2 puffs into the lungs as needed (when sick).   carvedilol  25 MG tablet Commonly known as: COREG  Take 1 tablet (25 mg total) by mouth 2 (two) times daily with a meal.   dextromethorphan-guaiFENesin  30-600 MG 12hr tablet Commonly known as: MUCINEX  DM Take 1 tablet by mouth 2 (two) times daily as needed for cough.   furosemide  20 MG tablet Commonly known as: LASIX  Take 1 tablet (20 mg total) by mouth daily only as needed for fluid retention.   hydrALAZINE  25 MG tablet Commonly known as: APRESOLINE  Take 1 tablet (25 mg total) by mouth 2 (two) times daily. Take 1 tablet at midday if BP>140/80   losartan  100 MG tablet Commonly known as: COZAAR  Take 1 tablet (100 mg total) by mouth daily. Patient must call and schedule annual appointment for further refills first attempt   Ozempic  (1 MG/DOSE) 4 MG/3ML Sopn Generic drug: Semaglutide  (1 MG/DOSE) Inject 1 mg as directed once a week.   pantoprazole  40 MG tablet Commonly known as: PROTONIX  Take 1 tablet (40 mg total) by mouth daily.   rosuvastatin  10 MG tablet Commonly known as: CRESTOR  Take 1 tablet (10 mg total) by mouth daily.   spironolactone  50 MG tablet Commonly known as: Aldactone  Take 1 tablet (50 mg total) by mouth daily.          REVIEW OF SYSTEMS: A comprehensive ROS was conducted with the patient and is negative except as per HPI    OBJECTIVE:  VS: BP 128/80   Pulse 60   Ht 5' 7 (1.702 m)   Wt  (!) 345 lb (156.5 kg)   BMI 54.03 kg/m    Wt Readings from Last 3 Encounters:  01/13/25 (!) 345 lb (156.5 kg)  08/06/24 (!) 338 lb 12.8 oz (153.7 kg)  07/17/24 (!) 334 lb 12.8 oz (151.9 kg)     EXAM: General: Pt appears well and is in NAD  Neck: General: Supple without adenopathy. Thyroid : Thyroid  size normal.  No goiter or nodules appreciated.   Lungs: Clear with good BS bilat   Heart: Auscultation: RRR.  Extremities:  BL LE: Trace pretibial edema normal   Mental Status: Judgment, insight: Intact Orientation: Oriented to time, place, and person Mood and affect: No depression, anxiety, or agitation     DATA REVIEWED:    Latest Reference Range & Units 01/16/24 08:55  Potassium 3.5 - 5.3 mmol/L 4.4      Latest Reference Range & Units 01/16/23 07:51  Cortisol, Plasma ug/dL 0.6    Latest Reference Range & Units 01/14/23 09:54  Sodium 135 - 145 mEq/L 145  Potassium 3.5 - 5.1 mEq/L 4.4  Chloride 96 - 112 mEq/L 105  CO2 19 - 32 mEq/L 30  Glucose 70 - 99 mg/dL 890 (H)  BUN 6 - 23 mg/dL 18  Creatinine 9.59 - 8.79 mg/dL 8.89  Calcium  8.4 - 10.5 mg/dL 9.3  Alkaline Phosphatase 39 - 117 U/L 53  Albumin 3.5 - 5.2 g/dL 4.2  AST 0 - 37 U/L 14  ALT 0 - 35 U/L 20  Total Protein 6.0 - 8.3 g/dL 6.7  Total Bilirubin 0.2 - 1.2 mg/dL 0.3  GFR >39.99 mL/min 61.23  (H): Data is abnormally high   Latest Reference Range & Units 04/04/22 07:52  ALDOSTERONE  ng/dL 15  ALDO / PRA Ratio 0.9 - 28.9 Ratio 5.1  Cortisol, Plasma ug/dL 83.8  Metanephrine, Pl <=57 pg/mL 28  Normetanephrine, Pl <=148 pg/mL 72    MR Abdomen 02/06/2023  Adrenals/Urinary Tract: 1.7 cm left adrenal mass remains stable in size, and shows homogeneous macroscopic fat signal intensity consistent with a benign adrenal lipoma. 1.9 cm right adrenal lesion is also stable in size and shows simple fluid attenuation and lack of contrast enhancement, consistent with a benign adrenal cyst. No renal masses identified.  No evidence of hydronephrosis.     ASSESSMENT/PLAN/RECOMMENDATIONS:   Right adrenal Adenoma :  -No clinical evidence of low corticoid, aldosterone, or catecholamine hypersecretion in the past -Adrenal imaging shows stability, no further imaging is needed - Will proceed with renin (patient on spironolactone ), and dexamethasone  suppression test    F/U in 1 year  Signed electronically by: Stefano Redgie Butts, MD  [facility] Endocrinology  [facility] [address] [place] [place], [place] [address] Phone: [phone] FAX: [phone]   CC: Katheen Roselie Rockford, NP [address] [place] [address] Phone: [phone] Fax: [phone]   Return to Endocrinology clinic as below: No future appointments.          "

## 2025-01-13 NOTE — Patient Instructions (Signed)
   Instructions for Dexamethasone Suppression Test   Step 1: Choose a morning when you can come to our lab at 8:00 am for a blood draw.   Step 2: On the night before the blood draw, take one 1 mg tablet of dexamethasone at 11:30 pm.  The timing is VERY important!   Step 3: The next morning, go to the lab for blood work at 8:00 am.  Bonita Quin do not have to be on an empty stomach, but the timing is VERY important!

## 2025-01-17 ENCOUNTER — Ambulatory Visit: Payer: Self-pay | Admitting: Internal Medicine

## 2025-01-17 LAB — RENIN: Renin Activity: 26.43 ng/mL/h — ABNORMAL HIGH (ref 0.25–5.82)

## 2025-01-17 LAB — ACTH: C206 ACTH: 21 pg/mL (ref 6–50)

## 2025-01-17 LAB — CORTISOL: Cortisol, Plasma: 10.3 ug/dL

## 2025-01-17 LAB — POTASSIUM: Potassium: 4.7 mmol/L (ref 3.5–5.3)

## 2025-01-19 ENCOUNTER — Other Ambulatory Visit

## 2025-01-19 ENCOUNTER — Other Ambulatory Visit (HOSPITAL_COMMUNITY): Payer: Self-pay

## 2025-01-19 MED ORDER — DEXAMETHASONE 1 MG PO TABS
1.0000 mg | ORAL_TABLET | Freq: Once | ORAL | 0 refills | Status: AC
  Filled 2025-01-19: qty 1, 1d supply, fill #0

## 2025-01-20 ENCOUNTER — Other Ambulatory Visit

## 2025-01-21 LAB — CORTISOL: Cortisol, Plasma: 1.4 ug/dL — ABNORMAL LOW

## 2026-01-13 ENCOUNTER — Ambulatory Visit: Admitting: Internal Medicine
